# Patient Record
Sex: Female | Born: 1985 | Race: White | Hispanic: No | Marital: Married | Smoking: Never smoker
Health system: Southern US, Community
[De-identification: ages and names within clinical notes are randomized; demographics above are authoritative.]

## PROBLEM LIST (undated history)

## (undated) DIAGNOSIS — R32 Unspecified urinary incontinence: Secondary | ICD-10-CM

## (undated) DIAGNOSIS — M419 Scoliosis, unspecified: Secondary | ICD-10-CM

## (undated) DIAGNOSIS — M21379 Foot drop, unspecified foot: Secondary | ICD-10-CM

## (undated) DIAGNOSIS — Z973 Presence of spectacles and contact lenses: Secondary | ICD-10-CM

## (undated) DIAGNOSIS — M2550 Pain in unspecified joint: Secondary | ICD-10-CM

## (undated) HISTORY — DX: Foot drop, unspecified foot: M21.379

## (undated) HISTORY — DX: Pain in unspecified joint: M25.50

## (undated) HISTORY — DX: Unspecified urinary incontinence: R32

## (undated) HISTORY — DX: Scoliosis, unspecified: M41.9

---

## 2004-12-06 HISTORY — PX: WISDOM TOOTH EXTRACTION: SHX21

## 2018-11-10 ENCOUNTER — Other Ambulatory Visit: Payer: Self-pay | Admitting: Family Medicine

## 2018-11-10 ENCOUNTER — Encounter: Payer: Self-pay | Admitting: Family Medicine

## 2018-11-10 ENCOUNTER — Ambulatory Visit (INDEPENDENT_AMBULATORY_CARE_PROVIDER_SITE_OTHER): Payer: No Typology Code available for payment source | Admitting: Family Medicine

## 2018-11-10 VITALS — BP 90/62 | HR 79 | Temp 98.3°F | Ht 63.0 in | Wt 135.2 lb

## 2018-11-10 DIAGNOSIS — Z3A01 Less than 8 weeks gestation of pregnancy: Secondary | ICD-10-CM | POA: Diagnosis not present

## 2018-11-10 NOTE — Progress Notes (Signed)
   Subjective:    Patient ID: Lisa Brady, female    DOB: 01-08-1986, 32 y.o.   MRN: [id number]  HPI  Presents to clinic to establish care with PCP & to get referral to midwife.   She did at home pregnancy test and it was positive and LMP was 10.18.19. This would put her around [redacted] weeks pregnant.   Past medical, surgical, social and family history reviewed and updated in chart: History reviewed. No pertinent past medical history.   Social History   Tobacco Use  . Smoking status: Never Smoker  . Smokeless tobacco: Never Used  Substance Use Topics  . Alcohol use: Not Currently    Past Surgical History:  Procedure Laterality Date  . WISDOM TOOTH EXTRACTION  2006   Family History  Problem Relation Age of Onset  . Cancer Mother   . Hyperlipidemia Father   . Hypertension Father   . Hyperlipidemia Brother   . Cancer Maternal Grandmother   . Heart disease Maternal Grandfather   . Cancer Paternal Grandmother   . Cancer Paternal Grandfather     Review of Systems  Constitutional: Negative for chills, fatigue and fever.  HENT: Negative for congestion, ear pain, sinus pain and sore throat.   Eyes: Negative.   Respiratory: Negative for cough, shortness of breath and wheezing.   Cardiovascular: Negative for chest pain, palpitations and leg swelling.  Gastrointestinal: Negative for abdominal pain, diarrhea, nausea and vomiting.  Genitourinary: Negative for dysuria, frequency and urgency.  Musculoskeletal: Negative for arthralgias and myalgias.  Skin: Negative for color change, pallor and rash.  Neurological: Negative for syncope, light-headedness and headaches.  Psychiatric/Behavioral: The patient is not nervous/anxious.       Objective:   Physical Exam  Constitutional:  She appears well-developed and well-nourished. No distress.  HENT:  Head: Normocephalic and atraumatic.  Eyes: EOM are normal. No scleral icterus.  Neck: Normal range of motion. Neck supple. No tracheal  deviation present.  Cardiovascular: Normal rate, regular rhythm and normal heart sounds.  Pulmonary/Chest: Effort normal and breath sounds normal. No respiratory distress. She has no wheezes. She has no rales.   Neurological: She is alert and oriented to person, place, and time.  Gait normal  Skin: Skin is warm and dry. No pallor.  Psychiatric: She has a normal mood and affect. Her behavior is normal. Thought content normal.   Nursing note and vitals reviewed.    Vitals:   11/10/18 1039  BP: 90/62  Pulse: 79  Temp: 98.3 F (36.8 C)  SpO2: 98%    Assessment & Plan:   Pregnant, approximately 7 weeks- due to last menstrual.  Dating I can place patient at approximately [redacted] weeks pregnant.  Patient declines doing repeat urine pregnancy or doing blood draw hCG in clinic today.  I will place referral to midwife per patient request.  Patient aware she will hear from either our referral coordinator or referral coordinator at OB/GYN clinic in regards to her upcoming appointment.    Patient will follow-up here as needed, aware she can return to clinic at any time if issues arise.

## 2018-11-16 NOTE — Progress Notes (Signed)
Lisa Brady presents for NOB nurse interview visit. Pregnancy confirmation done 11/17/18 [facility].  G-1 .  P- 0   . Pregnancy education material explained and given.  0_ cats in the home. NOB labs ordered. HIV labs and Drug screen were explained optional and she did not decline. Drug screen ordered. PNV encouraged. Genetic screening options discussed. Genetic testing: Ordered/Declined/Unsure.  Pt may discuss with provider. Pt. To follow up with provider in 3__ weeks for NOB physical.  All questions answered.Pt would like Nucal Translucent. Will discuss with provider.

## 2018-11-17 ENCOUNTER — Ambulatory Visit: Payer: No Typology Code available for payment source | Admitting: Obstetrics and Gynecology

## 2018-11-17 VITALS — BP 115/73 | HR 75 | Ht 63.0 in | Wt 137.0 lb

## 2018-11-17 DIAGNOSIS — Z3401 Encounter for supervision of normal first pregnancy, first trimester: Secondary | ICD-10-CM

## 2018-11-17 NOTE — Addendum Note (Signed)
Addended by: Raliegh Ip on: 11/17/2018 10:57 AM   Modules accepted: Orders

## 2018-11-18 LAB — CBC WITH DIFFERENTIAL
Basophils Absolute: 0.1 10*3/uL (ref 0.0–0.2)
Basos: 1 %
EOS (ABSOLUTE): 0.1 10*3/uL (ref 0.0–0.4)
Eos: 1 %
Hematocrit: 38.7 % (ref 34.0–46.6)
Hemoglobin: 13 g/dL (ref 11.1–15.9)
Immature Grans (Abs): 0 10*3/uL (ref 0.0–0.1)
Immature Granulocytes: 0 %
Lymphocytes Absolute: 1.9 10*3/uL (ref 0.7–3.1)
Lymphs: 25 %
MCH: 30.2 pg (ref 26.6–33.0)
MCHC: 33.6 g/dL (ref 31.5–35.7)
MCV: 90 fL (ref 79–97)
Monocytes Absolute: 0.7 10*3/uL (ref 0.1–0.9)
Monocytes: 9 %
Neutrophils Absolute: 4.7 10*3/uL (ref 1.4–7.0)
Neutrophils: 64 %
RBC: 4.31 x10E6/uL (ref 3.77–5.28)
RDW: 12.5 % (ref 12.3–15.4)
WBC: 7.4 10*3/uL (ref 3.4–10.8)

## 2018-11-18 LAB — URINALYSIS, ROUTINE W REFLEX MICROSCOPIC
Bilirubin, UA: NEGATIVE
Glucose, UA: NEGATIVE
Ketones, UA: NEGATIVE
Leukocytes, UA: NEGATIVE
NITRITE UA: NEGATIVE
Protein, UA: NEGATIVE
RBC, UA: NEGATIVE
Specific Gravity, UA: 1.022 (ref 1.005–1.030)
Urobilinogen, Ur: 0.2 mg/dL (ref 0.2–1.0)
pH, UA: 7 (ref 5.0–7.5)

## 2018-11-18 LAB — HEPATITIS B SURFACE ANTIGEN: Hepatitis B Surface Ag: NEGATIVE

## 2018-11-18 LAB — NICOTINE SCREEN, URINE: Cotinine Ql Scrn, Ur: NEGATIVE ng/mL

## 2018-11-18 LAB — VARICELLA ZOSTER ANTIBODY, IGG: Varicella zoster IgG: <135 index — ABNORMAL LOW (ref >165)

## 2018-11-18 LAB — RUBELLA SCREEN: Rubella Antibodies, IGG: 1.32 index (ref >0.99)

## 2018-11-18 LAB — ABO AND RH: Rh Factor: POSITIVE

## 2018-11-18 LAB — RPR: RPR Ser Ql: NONREACTIVE

## 2018-11-18 LAB — HIV ANTIBODY (ROUTINE TESTING W REFLEX): HIV SCREEN 4TH GENERATION: NONREACTIVE

## 2018-11-18 LAB — ANTIBODY SCREEN: Antibody Screen: NEGATIVE

## 2018-11-19 LAB — URINE CULTURE

## 2018-11-20 ENCOUNTER — Other Ambulatory Visit: Payer: Self-pay | Admitting: Obstetrics and Gynecology

## 2018-11-20 DIAGNOSIS — O09899 Supervision of other high risk pregnancies, unspecified trimester: Secondary | ICD-10-CM

## 2018-11-20 DIAGNOSIS — Z2839 Other underimmunization status: Secondary | ICD-10-CM

## 2018-11-20 DIAGNOSIS — Z283 Underimmunization status: Principal | ICD-10-CM

## 2018-11-20 HISTORY — DX: Supervision of other high risk pregnancies, unspecified trimester: O09.899

## 2018-11-20 HISTORY — DX: Other underimmunization status: Z28.39

## 2018-11-21 LAB — GC/CHLAMYDIA PROBE AMP
Chlamydia trachomatis, NAA: NEGATIVE
Neisseria gonorrhoeae by PCR: NEGATIVE

## 2018-12-06 NOTE — L&D Delivery Note (Signed)
     Delivery Note   Lisa Brady is a 33 y.o. G1P1001 at [redacted]w[redacted]d Estimated Date of Delivery: 06/29/19  PRE-OPERATIVE DIAGNOSIS:  1) [redacted]w[redacted]d pregnancy.    POST-OPERATIVE DIAGNOSIS:  1) [redacted]w[redacted]d pregnancy s/p Vaginal, Spontaneous , Vacuum assisted    Delivery Type: Vaginal, Spontaneous  , vacuum assisted  Delivery Anesthesia: Epidural   Labor Complications:   none    ESTIMATED BLOOD LOSS: 600 ml    FINDINGS:   1) female infant, Apgar scores of 9   at 1 minute and 9   at 5 minutes and a birthweight pending infant remains skin to skin.    2) Nuchal cord: None, Compound presentation of left hand.   SPECIMENS:   PLACENTA:   Appearance: Intact , 3 vessel cord   Removal: Spontaneous      Disposition:  Held per protocol then discarded  DISPOSITION:  Infant to left in stable condition in the delivery room, with L&D personnel and mother,  NARRATIVE SUMMARY: Labor course:  Ms. Lisa Brady is a G1P1001 at [redacted]w[redacted]d who presented for induction of labor.  She progressed well in labor with cytotec and pitocin.  She received the appropriate epidural  anesthesia and proceeded to complete dilation. She evidenced adequate maternal expulsive effort during the second stage. Dr. Marcelline Mates in to assist with Vacuum. Vacuum placed by Dr. Marcelline Mates pop off x 1. She went on to deliver a viable female infant in LOT. The placenta delivered without problems and was noted to be complete. A perineal and vaginal examination was performed. Episiotomy/Lacerations: 2nd degree   Lacerations were repaired with 3-0 Vicryl Rapide suture using local anesthesia. The patient tolerated this well. 800 mcg cytotec placed rectally.   Philip Aspen, CNM  07/05/2019 12:16 PM

## 2018-12-08 ENCOUNTER — Encounter: Payer: Self-pay | Admitting: Obstetrics and Gynecology

## 2018-12-08 ENCOUNTER — Ambulatory Visit (INDEPENDENT_AMBULATORY_CARE_PROVIDER_SITE_OTHER): Payer: No Typology Code available for payment source | Admitting: Obstetrics and Gynecology

## 2018-12-08 VITALS — BP 114/63 | HR 81 | Wt 136.9 lb

## 2018-12-08 DIAGNOSIS — Z3491 Encounter for supervision of normal pregnancy, unspecified, first trimester: Secondary | ICD-10-CM

## 2018-12-08 LAB — POCT URINALYSIS DIPSTICK OB
Bilirubin, UA: NEGATIVE
Blood, UA: NEGATIVE
Glucose, UA: NEGATIVE
KETONES UA: NEGATIVE
Leukocytes, UA: NEGATIVE
Nitrite, UA: NEGATIVE
POC,PROTEIN,UA: NEGATIVE
SPEC GRAV UA: 1.01 (ref 1.010–1.025)
Urobilinogen, UA: 0.2 E.U./dL
pH, UA: 7.5 (ref 5.0–8.0)

## 2018-12-08 NOTE — Patient Instructions (Signed)
Thank you for enrolling in [name]. Please follow the instructions below to securely access your online medical record. MyChart allows you to send messages to your doctor, view your test results, renew your prescriptions, schedule appointments, and more.  How Do I Sign Up? 1. In your Internet browser, go to [email or web address]. 2. Click on the New  User? link in the Sign In box.  3. Enter your MyChart Access Code exactly as it appears below. You will not need to use this code after you have completed the sign-up process. If you do not sign up before the expiration date, you must request a new code. MyChart Access Code: [email or web address] Expires: 12/25/2018 11:01 AM  4. Enter the last four digits of your Social Security Number (xxxx) and Date of Birth (mm/dd/yyyy) as indicated and click Next. You will be taken to the next sign-up page. 5. Create a MyChart ID. This will be your MyChart login ID and cannot be changed, so think of one that is secure and easy to remember. 6. Create a MyChart password. You can change your password at any time. 7. Enter your Password Reset Question and Answer and click Next. This can be used at a later time if you forget your password.  8. Select your communication preference, and if applicable enter your e-mail address. You will receive e-mail notification when new information is available in MyChart by choosing to receive e-mail notifications and filling in your e-mail. 9. Click Sign In. You can now view your medical record.   Additional Information If you have questions, you can email [email or web address] or call [phone] to talk to our [place] staff. Remember, MyChart is NOT to be used for urgent needs. For medical emergencies, dial 911.  Second Trimester of Pregnancy  The second trimester is from week 14 through week 27 (month 4 through 6). This is often the time in pregnancy that you feel your best. Often times, morning sickness  has lessened or quit. You may have more energy, and you may get hungry more often. Your unborn baby is growing rapidly. At the end of the sixth month, he or she is about 9 inches long and weighs about 1 pounds. You will likely feel the baby move between 18 and 20 weeks of pregnancy. Follow these instructions at home: Medicines  Take over-the-counter and prescription medicines only as told by your doctor. Some medicines are safe and some medicines are not safe during pregnancy.  Take a prenatal vitamin that contains at least 600 micrograms (mcg) of folic acid.  If you have trouble pooping (constipation), take medicine that will make your stool soft (stool softener) if your doctor approves. Eating and drinking   Eat regular, healthy meals.  Avoid raw meat and uncooked cheese.  If you get low calcium from the food you eat, talk to your doctor about taking a daily calcium supplement.  Avoid foods that are high in fat and sugars, such as fried and sweet foods.  If you feel sick to your stomach (nauseous) or throw up (vomit): ? Eat 4 or 5 small meals a day instead of 3 large meals. ? Try eating a few soda crackers. ? Drink liquids between meals instead of during meals.  To prevent constipation: ? Eat foods that are high in fiber, like fresh fruits and vegetables, whole grains, and beans. ? Drink enough fluids to keep your pee (urine) clear or pale yellow. Activity  Exercise only as told by your doctor. Stop  exercising if you start to have cramps.  Do not exercise if it is too hot, too humid, or if you are in a place of great height (high altitude).  Avoid heavy lifting.  Wear low-heeled shoes. Sit and stand up straight.  You can continue to have sex unless your doctor tells you not to. Relieving pain and discomfort  Wear a good support bra if your breasts are tender.  Take warm water baths (sitz baths) to soothe pain or discomfort caused by hemorrhoids. Use hemorrhoid cream if  your doctor approves.  Rest with your legs raised if you have leg cramps or low back pain.  If you develop puffy, bulging veins (varicose veins) in your legs: ? Wear support hose or compression stockings as told by your doctor. ? Raise (elevate) your feet for 15 minutes, 3-4 times a day. ? Limit salt in your food. Prenatal care  Write down your questions. Take them to your prenatal visits.  Keep all your prenatal visits as told by your doctor. This is important. Safety  Wear your seat belt when driving.  Make a list of emergency phone numbers, including numbers for family, friends, the hospital, and police and fire departments. General instructions  Ask your doctor about the right foods to eat or for help finding a counselor, if you need these services.  Ask your doctor about local prenatal classes. Begin classes before month 6 of your pregnancy.  Do not use hot tubs, steam rooms, or saunas.  Do not douche or use tampons or scented sanitary pads.  Do not cross your legs for long periods of time.  Visit your dentist if you have not done so. Use a soft toothbrush to brush your teeth. Floss gently.  Avoid all smoking, herbs, and alcohol. Avoid drugs that are not approved by your doctor.  Do not use any products that contain nicotine or tobacco, such as cigarettes and e-cigarettes. If you need help quitting, ask your doctor.  Avoid cat litter boxes and soil used by cats. These carry germs that can cause birth defects in the baby and can cause a loss of your baby (miscarriage) or stillbirth. Contact a doctor if:  You have mild cramps or pressure in your lower belly.  You have pain when you pee (urinate).  You have bad smelling fluid coming from your vagina.  You continue to feel sick to your stomach (nauseous), throw up (vomit), or have watery poop (diarrhea).  You have a nagging pain in your belly area.  You feel dizzy. Get help right away if:  You have a fever.  You  are leaking fluid from your vagina.  You have spotting or bleeding from your vagina.  You have severe belly cramping or pain.  You lose or gain weight rapidly.  You have trouble catching your breath and have chest pain.  You notice sudden or extreme puffiness (swelling) of your face, hands, ankles, feet, or legs.  You have not felt the baby move in over an hour.  You have severe headaches that do not go away when you take medicine.  You have trouble seeing. Summary  The second trimester is from week 14 through week 27 (months 4 through 6). This is often the time in pregnancy that you feel your best.  To take care of yourself and your unborn baby, you will need to eat healthy meals, take medicines only if your doctor tells you to do so, and do activities that are safe for you  and your baby.  Call your doctor if you get sick or if you notice anything unusual about your pregnancy. Also, call your doctor if you need help with the right food to eat, or if you want to know what activities are safe for you. This information is not intended to replace advice given to you by your health care provider. Make sure you discuss any questions you have with your health care provider. Document Released: 02/16/2010 Document Revised: 12/28/2016 Document Reviewed: 12/28/2016 Elsevier Interactive Patient Education  2019 [employer].

## 2018-12-08 NOTE — Progress Notes (Signed)
NEW OB HISTORY AND PHYSICAL  SUBJECTIVE:       Lisa Brady is a 33 y.o. G1P0 female, Patient's last menstrual period was 09/22/2018 (exact date)., Estimated Date of Delivery: 06/29/19, [redacted]w[redacted]d, presents today for establishment of Prenatal Care. She has no unusual complaints and complains of fatigue      Gynecologic History Patient's last menstrual period was 09/22/2018 (exact date). Normal Contraception: none Last Pap: spring 2019. Results were: normal  Obstetric History OB History  Gravida Para Term Preterm AB Living  1            SAB TAB Ectopic Multiple Live Births               # Outcome Date GA Lbr Len/2nd Weight Sex Delivery Anes PTL Lv  1 Current             No past medical history on file.  Past Surgical History:  Procedure Laterality Date  . WISDOM TOOTH EXTRACTION  2006    Current Outpatient Medications on File Prior to Visit  Medication Sig Dispense Refill  . Prenatal Vit-Fe Fumarate-FA (PRENATAL MULTIVITAMIN) TABS tablet Take 1 tablet by mouth daily at 12 noon.     No current facility-administered medications on file prior to visit.     No Known Allergies  Social History   Socioeconomic History  . Marital status: Married    Spouse name: Not on file  . Number of children: Not on file  . Years of education: Not on file  . Highest education level: Not on file  Occupational History  . Not on file  Social Needs  . Financial resource strain: Not on file  . Food insecurity:    Worry: Not on file    Inability: Not on file  . Transportation needs:    Medical: Not on file    Non-medical: Not on file  Tobacco Use  . Smoking status: Never Smoker  . Smokeless tobacco: Never Used  Substance and Sexual Activity  . Alcohol use: Not Currently  . Drug use: Never  . Sexual activity: Yes    Birth control/protection: None  Lifestyle  . Physical activity:    Days per week: Not on file    Minutes per session: Not on file  . Stress: Not on file  Relationships   . Social connections:    Talks on phone: Not on file    Gets together: Not on file    Attends religious service: Not on file    Active member of club or organization: Not on file    Attends meetings of clubs or organizations: Not on file    Relationship status: Not on file  . Intimate partner violence:    Fear of current or ex partner: Not on file    Emotionally abused: Not on file    Physically abused: Not on file    Forced sexual activity: Not on file  Other Topics Concern  . Not on file  Social History Narrative  . Not on file    Family History  Problem Relation Age of Onset  . Cancer Mother   . Hyperlipidemia Father   . Hypertension Father   . Hyperlipidemia Brother   . Cancer Maternal Grandmother   . Heart disease Maternal Grandfather   . Cancer Paternal Grandmother   . Cancer Paternal Grandfather     The following portions of the patient's history were reviewed and updated as appropriate: allergies, current medications, past OB history, past medical history,  past surgical history, past family history, past social history, and problem list.    OBJECTIVE: Initial Physical Exam (New OB)  GENERAL APPEARANCE: alert, well appearing, in no apparent distress, oriented to person, place and time HEAD: normocephalic, atraumatic MOUTH: mucous membranes moist, pharynx normal without lesions and dental hygiene good THYROID: no thyromegaly or masses present BREASTS: not examined LUNGS: clear to auscultation, no wheezes, rales or rhonchi, symmetric air entry HEART: regular rate and rhythm, no murmurs ABDOMEN: soft, nontender, nondistended, no abnormal masses, no epigastric pain, fundus not palpable and FHT present EXTREMITIES: no redness or tenderness in the calves or thighs SKIN: normal coloration and turgor, no rashes LYMPH NODES: no adenopathy palpable NEUROLOGIC: alert, oriented, normal speech, no focal findings or movement disorder noted  PELVIC EXAM not  examined  ASSESSMENT: Normal pregnancy Varicella NI  PLAN: Prenatal care Desires NT- will schedule.  Flu vaccine already received this year See orders

## 2018-12-08 NOTE — Progress Notes (Signed)
NOB PE-pt is doing well

## 2018-12-20 ENCOUNTER — Ambulatory Visit (INDEPENDENT_AMBULATORY_CARE_PROVIDER_SITE_OTHER): Payer: No Typology Code available for payment source

## 2018-12-20 ENCOUNTER — Other Ambulatory Visit: Payer: No Typology Code available for payment source

## 2018-12-20 DIAGNOSIS — Z3491 Encounter for supervision of normal pregnancy, unspecified, first trimester: Secondary | ICD-10-CM

## 2018-12-20 DIAGNOSIS — Z3682 Encounter for antenatal screening for nuchal translucency: Secondary | ICD-10-CM

## 2018-12-26 ENCOUNTER — Encounter (HOSPITAL_COMMUNITY): Payer: Self-pay

## 2018-12-27 ENCOUNTER — Ambulatory Visit (INDEPENDENT_AMBULATORY_CARE_PROVIDER_SITE_OTHER): Payer: No Typology Code available for payment source

## 2018-12-27 ENCOUNTER — Other Ambulatory Visit: Payer: Self-pay | Admitting: Obstetrics and Gynecology

## 2018-12-27 DIAGNOSIS — Z3491 Encounter for supervision of normal pregnancy, unspecified, first trimester: Secondary | ICD-10-CM

## 2018-12-27 DIAGNOSIS — Z3682 Encounter for antenatal screening for nuchal translucency: Secondary | ICD-10-CM

## 2019-01-05 ENCOUNTER — Ambulatory Visit (INDEPENDENT_AMBULATORY_CARE_PROVIDER_SITE_OTHER): Payer: No Typology Code available for payment source | Admitting: Certified Nurse Midwife

## 2019-01-05 VITALS — BP 106/62 | HR 91 | Wt 142.5 lb

## 2019-01-05 DIAGNOSIS — Z3402 Encounter for supervision of normal first pregnancy, second trimester: Secondary | ICD-10-CM

## 2019-01-05 LAB — POCT URINALYSIS DIPSTICK OB
Bilirubin, UA: NEGATIVE
Blood, UA: NEGATIVE
Glucose, UA: NEGATIVE
Ketones, UA: NEGATIVE
LEUKOCYTES UA: NEGATIVE
NITRITE UA: NEGATIVE
POC,PROTEIN,UA: NEGATIVE
Spec Grav, UA: <=1.005 — AB (ref 1.010–1.025)
Urobilinogen, UA: 0.2 E.U./dL
pH, UA: 7 (ref 5.0–8.0)

## 2019-01-05 NOTE — Patient Instructions (Signed)
WE WOULD LOVE TO HEAR FROM YOU!!!!   Thank you Lisa Brady for visiting [facility].  Providing our patients with the best experience possible is really important to [place], and we hope that you felt that on your recent visit. The most valuable feedback we get comes from [place]!!    If you receive a survey please take a couple of minutes to let us know how we did.Thank you for continuing to trust [place] with your care.   [facility]  WHAT OB PATIENTS CAN EXPECT   Confirmation of pregnancy and ultrasound ordered if medically indicated-[redacted] weeks gestation  New OB (NOB) intake with nurse and New OB (NOB) labs- [redacted] weeks gestation  New OB (NOB) physical examination with provider- 11/[redacted] weeks gestation  Flu vaccine-[redacted] weeks gestation  Anatomy scan-[redacted] weeks gestation  Glucose tolerance test, blood work to test for anemia, T-dap vaccine-[redacted] weeks gestation  Vaginal swabs/cultures-STD/Group B strep-[redacted] weeks gestation  Appointments every 4 weeks until 28 weeks  Every 2 weeks from 28 weeks until 36 weeks  Weekly visits from 36 weeks until delivery   Round Ligament Pain  The round ligament is a cord of muscle and tissue that helps support the uterus. It can become a source of pain during pregnancy if it becomes stretched or twisted as the baby grows. The pain usually begins in the second trimester (13-28 weeks) of pregnancy, and it can come and go until the baby is delivered. It is not a serious problem, and it does not cause harm to the baby. Round ligament pain is usually a short, sharp, and pinching pain, but it can also be a dull, lingering, and aching pain. The pain is felt in the lower side of the abdomen or in the groin. It usually starts deep in the groin and moves up to the outside of the hip area. The pain may occur when you:  Suddenly change position, such as quickly going from a sitting to standing position.  Roll over in bed.  Cough or sneeze.  Do physical  activity. Follow these instructions at home:   Watch your condition for any changes.  When the pain starts, relax. Then try any of these methods to help with the pain: ? Sitting down. ? Flexing your knees up to your abdomen. ? Lying on your side with one pillow under your abdomen and another pillow between your legs. ? Sitting in a warm bath for 15-20 minutes or until the pain goes away.  Take over-the-counter and prescription medicines only as told by your health care provider.  Move slowly when you sit down or stand up.  Avoid long walks if they cause pain.  Stop or reduce your physical activities if they cause pain.  Keep all follow-up visits as told by your health care provider. This is important. Contact a health care provider if:  Your pain does not go away with treatment.  You feel pain in your back that you did not have before.  Your medicine is not helping. Get help right away if:  You have a fever or chills.  You develop uterine contractions.  You have vaginal bleeding.  You have nausea or vomiting.  You have diarrhea.  You have pain when you urinate. Summary  Round ligament pain is felt in the lower abdomen or groin. It is usually a short, sharp, and pinching pain. It can also be a dull, lingering, and aching pain.  This pain usually begins in the second  trimester (13-28 weeks). It occurs because the uterus is stretching with the growing baby, and it is not harmful to the baby.  You may notice the pain when you suddenly change position, when you cough or sneeze, or during physical activity.  Relaxing, flexing your knees to your abdomen, lying on one side, or taking a warm bath may help to get rid of the pain.  Get help from your health care provider if the pain does not go away or if you have vaginal bleeding, nausea, vomiting, diarrhea, or painful urination. This information is not intended to replace advice given to you by your health care provider.  Make sure you discuss any questions you have with your health care provider. Document Released: 08/31/2008 Document Revised: 05/10/2018 Document Reviewed: 05/10/2018 Elsevier Interactive Patient Education  2019 [employer].

## 2019-01-05 NOTE — Progress Notes (Signed)
ROB-Patient c/o headaches, has tried Tylenol with some relief.  Patient also c/o sore throat and runny nose that started 2 days ago, no fever.

## 2019-01-05 NOTE — Progress Notes (Signed)
ROB-Reports sore throat, runny nose, and intermittent headaches. Discussed home treatment measures including use of OTC medications including magnesium. Anticipatory guidance regarding course of prenatal care. Reviewed red flag symptoms and when to call. RTC x 4-5 weeks for ANATOMY SCAN and ROB or sooner if needed.

## 2019-01-29 ENCOUNTER — Other Ambulatory Visit: Payer: Self-pay | Admitting: Certified Nurse Midwife

## 2019-01-29 DIAGNOSIS — Z3402 Encounter for supervision of normal first pregnancy, second trimester: Secondary | ICD-10-CM

## 2019-01-29 DIAGNOSIS — Z3689 Encounter for other specified antenatal screening: Secondary | ICD-10-CM

## 2019-01-31 ENCOUNTER — Ambulatory Visit (INDEPENDENT_AMBULATORY_CARE_PROVIDER_SITE_OTHER): Payer: No Typology Code available for payment source

## 2019-01-31 ENCOUNTER — Ambulatory Visit (INDEPENDENT_AMBULATORY_CARE_PROVIDER_SITE_OTHER): Payer: No Typology Code available for payment source | Admitting: Certified Nurse Midwife

## 2019-01-31 VITALS — BP 100/61 | HR 73 | Wt 146.0 lb

## 2019-01-31 DIAGNOSIS — Z3402 Encounter for supervision of normal first pregnancy, second trimester: Secondary | ICD-10-CM

## 2019-01-31 DIAGNOSIS — Z363 Encounter for antenatal screening for malformations: Secondary | ICD-10-CM | POA: Diagnosis not present

## 2019-01-31 DIAGNOSIS — Z3689 Encounter for other specified antenatal screening: Secondary | ICD-10-CM

## 2019-01-31 NOTE — Progress Notes (Signed)
ROB doing well. Feeling some movement - anterior placenta reviewed with pt. U/s today for anatomy complete (see below). Reviewed weight gain . Encouraged regular exercise and watching her diet. She verbalizes and agrees to plan . Reviewed round ligament pain and use of belly band, heat, ice , and tylenol for musculoskeletal discomforts. Follow up 4 wks.   Philip Aspen, CNM   Patient Name: Lisa Brady DOB: 21-Dec-1985 MRN: [id number]  ULTRASOUND REPORT  Location: [id number] Date of Service: 01/31/2019   Indications:Anatomy Ultrasound Findings:  Lisa Brady intrauterine pregnancy is visualized with FHR at 158 BPM. Biometrics give an (U/S) Gestational age of [redacted]w[redacted]d and an (U/S) EDD of 06/21/19; this correlates with the clinically established Estimated Date of Delivery: 06/29/19  Fetal presentation is Breech.  EFW: wnl. Placenta: anterior. Grade: 0 AFI: subjectively normal.  Anatomic survey is complete and normal; Gender - surprise.    Right Ovary is normal in appearance. Left Ovary is normal appearance. Survey of the adnexa demonstrates no adnexal masses. There is no free peritoneal fluid in the cul de sac.  Impression: 1. [redacted]w[redacted]d Viable Singleton Intrauterine pregnancy by U/S. 2. (U/S) EDD is consistent with Clinically established Estimated Date of Delivery: 06/29/19 . 3. Normal Anatomy Scan  Recommendations: 1.Clinical correlation with the patient's History and Physical Exam. 2.   Ninfa Linden, RDMS

## 2019-01-31 NOTE — Patient Instructions (Signed)

## 2019-02-02 ENCOUNTER — Ambulatory Visit (INDEPENDENT_AMBULATORY_CARE_PROVIDER_SITE_OTHER): Payer: No Typology Code available for payment source | Admitting: Family Medicine

## 2019-02-02 ENCOUNTER — Encounter: Payer: No Typology Code available for payment source | Admitting: Certified Nurse Midwife

## 2019-02-02 ENCOUNTER — Encounter: Payer: Self-pay | Admitting: Family Medicine

## 2019-02-02 VITALS — BP 96/58 | HR 83 | Temp 98.2°F | Resp 16 | Ht 63.0 in | Wt 145.6 lb

## 2019-02-02 DIAGNOSIS — M549 Dorsalgia, unspecified: Secondary | ICD-10-CM

## 2019-02-02 DIAGNOSIS — M25512 Pain in left shoulder: Secondary | ICD-10-CM | POA: Diagnosis not present

## 2019-02-02 NOTE — Progress Notes (Signed)
Subjective:    Patient ID: Lisa Brady, female    DOB: 12-19-1985, 33 y.o.   MRN: [id number]  HPI   Patient presents to clinic complaining of left upper back and left shoulder pain for about 3 weeks.  Denies any fall or trauma.  She does carry her laptop using her left arm, so at first suspect it was from overuse.  She started taking Tylenol, heat, ice and doing range of motion exercises with some help in reducing pain.  Patient has begun to have pain at random times when she has not done any thing that would induce pain, she will have burning across upper back and into left shoulder with washing a few dishes, laying in bed.  Denies any numbness or tingling in fingers.   Patient is [redacted] weeks pregnant.  Patient Active Problem List   Diagnosis Date Noted  . Maternal varicella, non-immune 11/20/2018   Social History   Tobacco Use  . Smoking status: Never Smoker  . Smokeless tobacco: Never Used  Substance Use Topics  . Alcohol use: Not Currently   Review of Systems  Constitutional: Negative for chills, fatigue and fever.  HENT: Negative for congestion, ear pain, sinus pain and sore throat.   Eyes: Negative.   Respiratory: Negative for cough, shortness of breath and wheezing.   Cardiovascular: Negative for chest pain, palpitations and leg swelling.  Gastrointestinal: Negative for abdominal pain, diarrhea, nausea and vomiting.  Genitourinary: Negative for dysuria, frequency and urgency.  Musculoskeletal: +left shoulder, left upper back pain Skin: Negative for color change, pallor and rash.  Neurological: Negative for syncope, light-headedness and headaches.  Psychiatric/Behavioral: The patient is not nervous/anxious.       Objective:   Physical Exam Vitals signs and nursing note reviewed.  Constitutional:      General: She is not in acute distress.    Appearance: She is not toxic-appearing.  HENT:     Head: Normocephalic and atraumatic.  Neck:     Musculoskeletal:  Normal range of motion. No neck rigidity.  Cardiovascular:     Rate and Rhythm: Normal rate and regular rhythm.     Pulses: Normal pulses.  Pulmonary:     Effort: Pulmonary effort is normal.     Breath sounds: Normal breath sounds.  Musculoskeletal:        General: No deformity.       Arms:     Comments: Location of pain represented purple circle on diagram.  Patient able to reach left arm straight up above head, behind back, up to side.  Is able to have both arm straight out in front of her and resist me pushing down.  No tenderness of AC joint of shoulder.  Skin:    General: Skin is warm and dry.     Coloration: Skin is not jaundiced or pale.     Findings: No erythema.  Neurological:     Mental Status: She is alert and oriented to person, place, and time.  Psychiatric:        Mood and Affect: Mood normal.        Behavior: Behavior normal.    Vitals:   02/02/19 0832  BP: (!) 96/58  Pulse: 83  Resp: 16  Temp: 98.2 F (36.8 C)  SpO2: 99%      Assessment & Plan:   Left shoulder pain, upper back pain on left side- due to patient being pregnant, our medication options are limited to Tylenol, topical heat and ice.  Patient will do physical therapy for evaluation and strengthening of muscles of back and shoulder.  I do not suspect anything is wrong with the bones due to physical exam, it appears to be more of a muscular issue.  Patient will keep regularly scheduled follow-up as planned, she will return to clinic sooner if any issues arise.  She is aware she will be contacted in regards to PT referral.

## 2019-02-09 ENCOUNTER — Telehealth: Payer: Self-pay

## 2019-02-09 NOTE — Telephone Encounter (Signed)
Copied from [place] [id number]. Topic: General - Other >> Feb 09, 2019 10:44 AM Oneta Rack wrote: Patient states [facility] is not in network, patient checked with insurance and would like referral placed with [facility] and would like a follow up call once referral is placed, please advise

## 2019-02-12 NOTE — Telephone Encounter (Signed)
Patient called to check status of referral. She is requesting a call back.

## 2019-02-12 NOTE — Telephone Encounter (Signed)
Pt called [facility] Patient states [facility] is not in network, patient checked with insurance and would like referral placed with [facility] and would like a follow up call once referral is placed, please advise

## 2019-02-12 NOTE — Telephone Encounter (Signed)
Melissa --  Can you change the PT referral location? If not then I will put in new referral  Thanks!  LG

## 2019-02-13 NOTE — Telephone Encounter (Signed)
Patient would like her referral sent to [facility] physical therapy and not [facility]. She says it was sent to [facility] again yesterday. Please call patient once changed.

## 2019-02-15 NOTE — Telephone Encounter (Signed)
This was sent to [facility] in [place]. I have resent it and requested [place] location.

## 2019-03-02 ENCOUNTER — Encounter: Payer: No Typology Code available for payment source | Admitting: Certified Nurse Midwife

## 2019-03-22 ENCOUNTER — Telehealth: Payer: Self-pay

## 2019-03-22 NOTE — Telephone Encounter (Signed)
Coronavirus (COVID-19) Are you at risk?  Are you at risk for the Coronavirus (COVID-19)?  To be considered HIGH RISK for Coronavirus (COVID-19), you have to meet the following criteria:  . Traveled to [place], [place], [place], [place] or [place]; or in the [place] to [place], [place], [place], or [place]; and have fever, cough, and shortness of breath within the last 2 weeks of travel OR . Been in close contact with a person diagnosed with COVID-19 within the last 2 weeks and have fever, cough, and shortness of breath . IF YOU DO NOT MEET THESE CRITERIA, YOU ARE CONSIDERED LOW RISK FOR COVID-19.  What to do if you are HIGH RISK for COVID-19?  Marland Kitchen If you are having a medical emergency, call 911. . Seek medical care right away. Before you go to a doctor's office, urgent care or emergency department, call ahead and tell them about your recent travel, contact with someone diagnosed with COVID-19, and your symptoms. You should receive instructions from your physician's office regarding next steps of care.  . When you arrive at healthcare provider, tell the healthcare staff immediately you have returned from visiting [place], [place], [place], [place] or [place]; or traveled in the [place] to [place], [place], [place], or [place]; in the last two weeks or you have been in close contact with a person diagnosed with COVID-19 in the last 2 weeks.   . Tell the health care staff about your symptoms: fever, cough and shortness of breath. . After you have been seen by a medical provider, you will be either: o Tested for (COVID-19) and discharged home on quarantine except to seek medical care if symptoms worsen, and asked to  - Stay home and avoid contact with others until you get your results (4-5 days)  - Avoid travel on public transportation if possible (such as bus, train, or airplane) or o Sent to the Emergency Department by EMS for evaluation, COVID-19 testing, and possible  admission depending on your condition and test results.  What to do if you are LOW RISK for COVID-19?  Reduce your risk of any infection by using the same precautions used for avoiding the common cold or flu:  Marland Kitchen Wash your hands often with soap and warm water for at least 20 seconds.  If soap and water are not readily available, use an alcohol-based hand sanitizer with at least 60% alcohol.  . If coughing or sneezing, cover your mouth and nose by coughing or sneezing into the elbow areas of your shirt or coat, into a tissue or into your sleeve (not your hands). . Avoid shaking hands with others and consider head nods or verbal greetings only. . Avoid touching your eyes, nose, or mouth with unwashed hands.  . Avoid close contact with people who are Lisa Brady. . Avoid places or events with large numbers of people in one location, like concerts or sporting events. . Carefully consider travel plans you have or are making. . If you are planning any travel outside or inside the [place], visit the CDC's Travelers' Health webpage for the latest health notices. . If you have some symptoms but not all symptoms, continue to monitor at home and seek medical attention if your symptoms worsen. . If you are having a medical emergency, call 911.  03/22/19 SCREENING NEG SLS ADDITIONAL HEALTHCARE OPTIONS FOR PATIENTS  [facility] / e-Visit: [email or web address]         [facility]: [phone]  [facility]: [phone]                   [facility]: [phone]  

## 2019-03-23 ENCOUNTER — Ambulatory Visit (INDEPENDENT_AMBULATORY_CARE_PROVIDER_SITE_OTHER): Payer: No Typology Code available for payment source | Admitting: Certified Nurse Midwife

## 2019-03-23 ENCOUNTER — Other Ambulatory Visit: Payer: Self-pay

## 2019-03-23 ENCOUNTER — Encounter: Payer: Self-pay | Admitting: Certified Nurse Midwife

## 2019-03-23 ENCOUNTER — Other Ambulatory Visit: Payer: No Typology Code available for payment source

## 2019-03-23 VITALS — BP 90/59 | HR 78 | Wt 150.3 lb

## 2019-03-23 DIAGNOSIS — Z23 Encounter for immunization: Secondary | ICD-10-CM | POA: Diagnosis not present

## 2019-03-23 DIAGNOSIS — Z131 Encounter for screening for diabetes mellitus: Secondary | ICD-10-CM

## 2019-03-23 DIAGNOSIS — Z3402 Encounter for supervision of normal first pregnancy, second trimester: Secondary | ICD-10-CM

## 2019-03-23 DIAGNOSIS — Z130 Encounter for screening for diseases of the blood and blood-forming organs and certain disorders involving the immune mechanism: Secondary | ICD-10-CM

## 2019-03-23 LAB — POCT URINALYSIS DIPSTICK OB
Bilirubin, UA: NEGATIVE
Blood, UA: NEGATIVE
Glucose, UA: NEGATIVE
Ketones, UA: NEGATIVE
Leukocytes, UA: NEGATIVE
Nitrite, UA: NEGATIVE
POC,PROTEIN,UA: NEGATIVE
Spec Grav, UA: 1.01 (ref 1.010–1.025)
Urobilinogen, UA: 0.2 E.U./dL
pH, UA: 6 (ref 5.0–8.0)

## 2019-03-23 MED ORDER — TETANUS-DIPHTH-ACELL PERTUSSIS 5-2.5-18.5 LF-MCG/0.5 IM SUSP
0.5000 mL | Freq: Once | INTRAMUSCULAR | Status: AC
  Administered 2019-03-23: 0.5 mL via INTRAMUSCULAR

## 2019-03-23 NOTE — Patient Instructions (Signed)
Glucose Tolerance Test During Pregnancy Why am I having this test? The glucose tolerance test (GTT) is done to check how your body processes sugar (glucose). This is one of several tests used to diagnose diabetes that develops during pregnancy (gestational diabetes mellitus). Gestational diabetes is a temporary form of diabetes that some women develop during pregnancy. It usually occurs during the second trimester of pregnancy and goes away after delivery. Testing (screening) for gestational diabetes usually occurs between 24 and 28 weeks of pregnancy. You may have the GTT test after having a 1-hour glucose screening test if the results from that test indicate that you may have gestational diabetes. You may also have this test if:  You have a history of gestational diabetes.  You have a history of giving birth to very large babies or have experienced repeated fetal loss (stillbirth).  You have signs and symptoms of diabetes, such as: ? Changes in your vision. ? Tingling or numbness in your hands or feet. ? Changes in hunger, thirst, and urination that are not otherwise explained by your pregnancy. What is being tested? This test measures the amount of glucose in your blood at different times during a period of 3 hours. This indicates how well your body is able to process glucose. What kind of sample is taken?  Blood samples are required for this test. They are usually collected by inserting a needle into a blood vessel. How do I prepare for this test?  For 3 days before your test, eat normally. Have plenty of carbohydrate-rich foods.  Follow instructions from your health care provider about: ? Eating or drinking restrictions on the day of the test. You may be asked to not eat or drink anything other than water (fast) starting 8-10 hours before the test. ? Changing or stopping your regular medicines. Some medicines may interfere with this test. Tell a health care provider about:  All  medicines you are taking, including vitamins, herbs, eye drops, creams, and over-the-counter medicines.  Any blood disorders you have.  Any surgeries you have had.  Any medical conditions you have. What happens during the test? First, your blood glucose will be measured. This is referred to as your fasting blood glucose, since you fasted before the test. Then, you will drink a glucose solution that contains a certain amount of glucose. Your blood glucose will be measured again 1, 2, and 3 hours after drinking the solution. This test takes about 3 hours to complete. You will need to stay at the testing location during this time. During the testing period:  Do not eat or drink anything other than the glucose solution.  Do not exercise.  Do not use any products that contain nicotine or tobacco, such as cigarettes and e-cigarettes. If you need help stopping, ask your health care provider. The testing procedure may vary among health care providers and hospitals. How are the results reported? Your results will be reported as milligrams of glucose per deciliter of blood (mg/dL) or millimoles per liter (mmol/L). Your health care provider will compare your results to normal ranges that were established after testing a large group of people (reference ranges). Reference ranges may vary among labs and hospitals. For this test, common reference ranges are:  Fasting: less than 95-105 mg/dL (5.3-5.8 mmol/L).  1 hour after drinking glucose: less than 180-190 mg/dL (10.0-10.5 mmol/L).  2 hours after drinking glucose: less than 155-165 mg/dL (8.6-9.2 mmol/L).  3 hours after drinking glucose: 140-145 mg/dL (7.8-8.1 mmol/L). What do the   results mean? Results within reference ranges are considered normal, meaning that your glucose levels are well-controlled. If two or more of your blood glucose levels are high, you may be diagnosed with gestational diabetes. If only one level is high, your health care  provider may suggest repeat testing or other tests to confirm a diagnosis. Talk with your health care provider about what your results mean. Questions to ask your health care provider Ask your health care provider, or the department that is doing the test:  When will my results be ready?  How will I get my results?  What are my treatment options?  What other tests do I need?  What are my next steps? Summary  The glucose tolerance test (GTT) is one of several tests used to diagnose diabetes that develops during pregnancy (gestational diabetes mellitus). Gestational diabetes is a temporary form of diabetes that some women develop during pregnancy.  You may have the GTT test after having a 1-hour glucose screening test if the results from that test indicate that you may have gestational diabetes. You may also have this test if you have any symptoms or risk factors for gestational diabetes.  Talk with your health care provider about what your results mean. This information is not intended to replace advice given to you by your health care provider. Make sure you discuss any questions you have with your health care provider. Document Released: 05/23/2012 Document Revised: 07/04/2017 Document Reviewed: 07/04/2017 Elsevier Interactive Patient Education  2019 Elsevier Inc.  

## 2019-03-23 NOTE — Progress Notes (Signed)
ROB doing well. Feels movement. Glucose screen/CBC/RPR/TDAP/BTC today. Pt interested in classes for her and her husband. Name added to list to follow up once alternative class available. Discussed BC after baby. She is leaning towards pill .  Sample birth plan given. Will follow up with results. Return in 5 wks.   Philip Aspen, CNM

## 2019-03-23 NOTE — Progress Notes (Signed)
ROB- gluclola done, blood consent signed, tdap given, pt is doing well

## 2019-03-24 LAB — CBC
Hematocrit: 35.7 % (ref 34.0–46.6)
Hemoglobin: 12 g/dL (ref 11.1–15.9)
MCH: 30.6 pg (ref 26.6–33.0)
MCHC: 33.6 g/dL (ref 31.5–35.7)
MCV: 91 fL (ref 79–97)
Platelets: 218 10*3/uL (ref 150–450)
RBC: 3.92 x10E6/uL (ref 3.77–5.28)
RDW: 12.3 % (ref 11.7–15.4)
WBC: 9 10*3/uL (ref 3.4–10.8)

## 2019-03-24 LAB — GLUCOSE, 1 HOUR GESTATIONAL: Gestational Diabetes Screen: 99 mg/dL (ref 65–139)

## 2019-03-24 LAB — RPR: RPR Ser Ql: NONREACTIVE

## 2019-04-26 ENCOUNTER — Telehealth: Payer: Self-pay | Admitting: *Deleted

## 2019-04-26 NOTE — Telephone Encounter (Signed)
Coronavirus (COVID-19) Are you at risk?  Are you at risk for the Coronavirus (COVID-19)?  To be considered HIGH RISK for Coronavirus (COVID-19), you have to meet the following criteria:  . Traveled to [place], [place], [place], [place] or [place]; or in the [place] to [place], [name], [place], or [place]; and have fever, cough, and shortness of breath within the last 2 weeks of travel OR . Been in close contact with a person diagnosed with COVID-19 within the last 2 weeks and have fever, cough, and shortness of breath . IF YOU DO NOT MEET THESE CRITERIA, YOU ARE CONSIDERED LOW RISK FOR COVID-19.  What to do if you are HIGH RISK for COVID-19?  . If you are having a medical emergency, call 911. . Seek medical care right away. Before you go to a doctor's office, urgent care or emergency department, call ahead and tell them about your recent travel, contact with someone diagnosed with COVID-19, and your symptoms. You should receive instructions from your physician's office regarding next steps of care.  . When you arrive at healthcare provider, tell the healthcare staff immediately you have returned from visiting [place], [place], [place], [place] or [place]; or traveled in the [place] to [place], [name], [place], or [place]; in the last two weeks or you have been in close contact with a person diagnosed with COVID-19 in the last 2 weeks.   . Tell the health care staff about your symptoms: fever, cough and shortness of breath. . After you have been seen by a medical provider, you will be either: o Tested for (COVID-19) and discharged home on quarantine except to seek medical care if symptoms worsen, and asked to  - Stay home and avoid contact with others until you get your results (4-5 days)  - Avoid travel on public transportation if possible (such as bus, train, or airplane) or o Sent to the Emergency Department by EMS for evaluation, COVID-19 testing, and possible  admission depending on your condition and test results.  What to do if you are LOW RISK for COVID-19?  Reduce your risk of any infection by using the same precautions used for avoiding the common cold or flu:  . Wash your hands often with soap and warm water for at least 20 seconds.  If soap and water are not readily available, use an alcohol-based hand sanitizer with at least 60% alcohol.  . If coughing or sneezing, cover your mouth and nose by coughing or sneezing into the elbow areas of your shirt or coat, into a tissue or into your sleeve (not your hands). . Avoid shaking hands with others and consider head nods or verbal greetings only. . Avoid touching your eyes, nose, or mouth with unwashed hands.  . Avoid close contact with people who are sick. . Avoid places or events with large numbers of people in one location, like concerts or sporting events. . Carefully consider travel plans you have or are making. . If you are planning any travel outside or inside the [place], visit the CDC's Travelers' Health webpage for the latest health notices. . If you have some symptoms but not all symptoms, continue to monitor at home and seek medical attention if your symptoms worsen. . If you are having a medical emergency, call 911.   ADDITIONAL HEALTHCARE OPTIONS FOR PATIENTS  [facility] / e-Visit: [email or web address]         [facility]: [phone]  [facility]: [phone]                   [facility]: [phone]   Spoke with pt denies any sx.  Sanay Belmar, CMA 

## 2019-04-27 ENCOUNTER — Other Ambulatory Visit: Payer: Self-pay

## 2019-04-27 ENCOUNTER — Ambulatory Visit (INDEPENDENT_AMBULATORY_CARE_PROVIDER_SITE_OTHER): Payer: No Typology Code available for payment source | Admitting: Obstetrics and Gynecology

## 2019-04-27 VITALS — BP 90/62 | HR 92 | Wt 155.7 lb

## 2019-04-27 DIAGNOSIS — Z3493 Encounter for supervision of normal pregnancy, unspecified, third trimester: Secondary | ICD-10-CM

## 2019-04-27 LAB — POCT URINALYSIS DIPSTICK OB
Bilirubin, UA: NEGATIVE
Blood, UA: NEGATIVE
Glucose, UA: NEGATIVE
Ketones, UA: NEGATIVE
Leukocytes, UA: NEGATIVE
Nitrite, UA: NEGATIVE
POC,PROTEIN,UA: NEGATIVE
Spec Grav, UA: 1.01 (ref 1.010–1.025)
Urobilinogen, UA: 0.2 E.U./dL
pH, UA: 7 (ref 5.0–8.0)

## 2019-04-27 NOTE — Progress Notes (Signed)
ROB- pt is doing well

## 2019-04-27 NOTE — Patient Instructions (Signed)
.  spinning babies website for encourage baby to turn head down

## 2019-04-27 NOTE — Progress Notes (Signed)
ROB- doing well, discussed delivery plans, breast feeding, OCPs, circumcision if a boy, encouraged to enroll in on-line classes.

## 2019-05-11 ENCOUNTER — Other Ambulatory Visit: Payer: Self-pay

## 2019-05-11 ENCOUNTER — Ambulatory Visit (INDEPENDENT_AMBULATORY_CARE_PROVIDER_SITE_OTHER): Payer: No Typology Code available for payment source | Admitting: Certified Nurse Midwife

## 2019-05-11 VITALS — BP 110/56 | HR 94 | Wt 157.9 lb

## 2019-05-11 DIAGNOSIS — Z3493 Encounter for supervision of normal pregnancy, unspecified, third trimester: Secondary | ICD-10-CM

## 2019-05-11 LAB — POCT URINALYSIS DIPSTICK OB
Bilirubin, UA: NEGATIVE
Blood, UA: NEGATIVE
Glucose, UA: NEGATIVE
Ketones, UA: NEGATIVE
Leukocytes, UA: NEGATIVE
Nitrite, UA: NEGATIVE
POC,PROTEIN,UA: NEGATIVE
Spec Grav, UA: 1.01 (ref 1.010–1.025)
Urobilinogen, UA: 0.2 E.U./dL
pH, UA: 6.5 (ref 5.0–8.0)

## 2019-05-11 NOTE — Patient Instructions (Signed)

## 2019-05-11 NOTE — Progress Notes (Signed)
ROB-Doing well, no questions or concerns. Enrolled in online childbirth classes, started yesterday. Discussed COVID restrictions and intrapartum testing. Vertex by bedside ultrasound. Anticipatory guidance regarding course of prenatal care. Reviewed red flag symptoms and when to call. RTC x 3 weeks for 36 wk cultures and ROB or sooner if needed.

## 2019-05-11 NOTE — Progress Notes (Signed)
ROB-No complaints.

## 2019-05-31 ENCOUNTER — Telehealth: Payer: Self-pay | Admitting: *Deleted

## 2019-05-31 NOTE — Telephone Encounter (Signed)
Coronavirus (COVID-19) Are you at risk?  Are you at risk for the Coronavirus (COVID-19)?  To be considered HIGH RISK for Coronavirus (COVID-19), you have to meet the following criteria:  . Traveled to [place], [place], [place], [place] or [place]; or in the [place] to [place], [name], [place], or [place]; and have fever, cough, and shortness of breath within the last 2 weeks of travel OR . Been in close contact with a person diagnosed with COVID-19 within the last 2 weeks and have fever, cough, and shortness of breath . IF YOU DO NOT MEET THESE CRITERIA, YOU ARE CONSIDERED LOW RISK FOR COVID-19.  What to do if you are HIGH RISK for COVID-19?  . If you are having a medical emergency, call 911. . Seek medical care right away. Before you go to a doctor's office, urgent care or emergency department, call ahead and tell them about your recent travel, contact with someone diagnosed with COVID-19, and your symptoms. You should receive instructions from your physician's office regarding next steps of care.  . When you arrive at healthcare provider, tell the healthcare staff immediately you have returned from visiting [place], [place], [place], [place] or [place]; or traveled in the [place] to [place], [name], [place], or [place]; in the last two weeks or you have been in close contact with a person diagnosed with COVID-19 in the last 2 weeks.   . Tell the health care staff about your symptoms: fever, cough and shortness of breath. . After you have been seen by a medical provider, you will be either: o Tested for (COVID-19) and discharged home on quarantine except to seek medical care if symptoms worsen, and asked to  - Stay home and avoid contact with others until you get your results (4-5 days)  - Avoid travel on public transportation if possible (such as bus, train, or airplane) or o Sent to the Emergency Department by EMS for evaluation, COVID-19 testing, and possible  admission depending on your condition and test results.  What to do if you are LOW RISK for COVID-19?  Reduce your risk of any infection by using the same precautions used for avoiding the common cold or flu:  . Wash your hands often with soap and warm water for at least 20 seconds.  If soap and water are not readily available, use an alcohol-based hand sanitizer with at least 60% alcohol.  . If coughing or sneezing, cover your mouth and nose by coughing or sneezing into the elbow areas of your shirt or coat, into a tissue or into your sleeve (not your hands). . Avoid shaking hands with others and consider head nods or verbal greetings only. . Avoid touching your eyes, nose, or mouth with unwashed hands.  . Avoid close contact with people who are sick. . Avoid places or events with large numbers of people in one location, like concerts or sporting events. . Carefully consider travel plans you have or are making. . If you are planning any travel outside or inside the [place], visit the CDC's Travelers' Health webpage for the latest health notices. . If you have some symptoms but not all symptoms, continue to monitor at home and seek medical attention if your symptoms worsen. . If you are having a medical emergency, call 911.   ADDITIONAL HEALTHCARE OPTIONS FOR PATIENTS  [facility] / e-Visit: [email or web address]         [facility]: [phone]  [facility]: [phone]                   [facility]: [phone]   Spoke with pt denies any sx.  Neema Barreira, CMA 

## 2019-06-01 ENCOUNTER — Ambulatory Visit (INDEPENDENT_AMBULATORY_CARE_PROVIDER_SITE_OTHER): Payer: No Typology Code available for payment source | Admitting: Obstetrics and Gynecology

## 2019-06-01 ENCOUNTER — Other Ambulatory Visit: Payer: Self-pay

## 2019-06-01 VITALS — BP 118/68 | HR 82 | Wt 159.6 lb

## 2019-06-01 DIAGNOSIS — Z113 Encounter for screening for infections with a predominantly sexual mode of transmission: Secondary | ICD-10-CM

## 2019-06-01 DIAGNOSIS — Z3493 Encounter for supervision of normal pregnancy, unspecified, third trimester: Secondary | ICD-10-CM

## 2019-06-01 DIAGNOSIS — Z3685 Encounter for antenatal screening for Streptococcus B: Secondary | ICD-10-CM

## 2019-06-01 LAB — POCT URINALYSIS DIPSTICK OB
Bilirubin, UA: NEGATIVE
Blood, UA: NEGATIVE
Glucose, UA: NEGATIVE
Ketones, UA: NEGATIVE
Leukocytes, UA: NEGATIVE
Nitrite, UA: NEGATIVE
POC,PROTEIN,UA: NEGATIVE
Spec Grav, UA: 1.015 (ref 1.010–1.025)
Urobilinogen, UA: 0.2 E.U./dL
pH, UA: 6.5 (ref 5.0–8.0)

## 2019-06-01 NOTE — Progress Notes (Signed)
ROB- doing well, labor precautions discussed, cultures obtained. Declined pelvic exam.

## 2019-06-01 NOTE — Progress Notes (Signed)
ROB- cultures obtained, pt is doing well

## 2019-06-03 LAB — STREP GP B NAA: Strep Gp B NAA: NEGATIVE

## 2019-06-05 LAB — GC/CHLAMYDIA PROBE AMP
Chlamydia trachomatis, NAA: NEGATIVE
Neisseria Gonorrhoeae by PCR: NEGATIVE

## 2019-06-13 ENCOUNTER — Other Ambulatory Visit: Payer: Self-pay

## 2019-06-13 ENCOUNTER — Ambulatory Visit (INDEPENDENT_AMBULATORY_CARE_PROVIDER_SITE_OTHER): Payer: No Typology Code available for payment source | Admitting: Certified Nurse Midwife

## 2019-06-13 ENCOUNTER — Encounter: Payer: Self-pay | Admitting: Certified Nurse Midwife

## 2019-06-13 VITALS — BP 109/66 | HR 84 | Wt 163.4 lb

## 2019-06-13 DIAGNOSIS — Z3493 Encounter for supervision of normal pregnancy, unspecified, third trimester: Secondary | ICD-10-CM

## 2019-06-13 LAB — POCT URINALYSIS DIPSTICK OB
Bilirubin, UA: NEGATIVE
Blood, UA: NEGATIVE
Glucose, UA: NEGATIVE
Ketones, UA: NEGATIVE
Leukocytes, UA: NEGATIVE
Nitrite, UA: NEGATIVE
POC,PROTEIN,UA: NEGATIVE
Spec Grav, UA: 1.01 (ref 1.010–1.025)
Urobilinogen, UA: 0.2 E.U./dL
pH, UA: 7.5 (ref 5.0–8.0)

## 2019-06-13 NOTE — Progress Notes (Signed)
ROB doing well. Feels good movement. Has irregular braxton hicks contractions. Labor precautions reviewed. Follow up 1 wk s  Philip Aspen, CNM

## 2019-06-13 NOTE — Patient Instructions (Signed)
Braxton Hicks Contractions Contractions of the uterus can occur throughout pregnancy, but they are not always a sign that you are in labor. You may have practice contractions called Braxton Hicks contractions. These false labor contractions are sometimes confused with true labor. What are Braxton Hicks contractions? Braxton Hicks contractions are tightening movements that occur in the muscles of the uterus before labor. Unlike true labor contractions, these contractions do not result in opening (dilation) and thinning of the cervix. Toward the end of pregnancy (32-34 weeks), Braxton Hicks contractions can happen more often and may become stronger. These contractions are sometimes difficult to tell apart from true labor because they can be very uncomfortable. You should not feel embarrassed if you go to the hospital with false labor. Sometimes, the only way to tell if you are in true labor is for your health care provider to look for changes in the cervix. The health care provider will do a physical exam and may monitor your contractions. If you are not in true labor, the exam should show that your cervix is not dilating and your water has not broken. If there are no other health problems associated with your pregnancy, it is completely safe for you to be sent home with false labor. You may continue to have Braxton Hicks contractions until you go into true labor. How to tell the difference between true labor and false labor True labor  Contractions last 30-70 seconds.  Contractions become very regular.  Discomfort is usually felt in the top of the uterus, and it spreads to the lower abdomen and low back.  Contractions do not go away with walking.  Contractions usually become more intense and increase in frequency.  The cervix dilates and gets thinner. False labor  Contractions are usually shorter and not as strong as true labor contractions.  Contractions are usually irregular.  Contractions  are often felt in the front of the lower abdomen and in the groin.  Contractions may go away when you walk around or change positions while lying down.  Contractions get weaker and are shorter-lasting as time goes on.  The cervix usually does not dilate or become thin. Follow these instructions at home:   Take over-the-counter and prescription medicines only as told by your health care provider.  Keep up with your usual exercises and follow other instructions from your health care provider.  Eat and drink lightly if you think you are going into labor.  If Braxton Hicks contractions are making you uncomfortable: ? Change your position from lying down or resting to walking, or change from walking to resting. ? Sit and rest in a tub of warm water. ? Drink enough fluid to keep your urine pale yellow. Dehydration may cause these contractions. ? Do slow and deep breathing several times an hour.  Keep all follow-up prenatal visits as told by your health care provider. This is important. Contact a health care provider if:  You have a fever.  You have continuous pain in your abdomen. Get help right away if:  Your contractions become stronger, more regular, and closer together.  You have fluid leaking or gushing from your vagina.  You pass blood-tinged mucus (bloody show).  You have bleeding from your vagina.  You have low back pain that you never had before.  You feel your baby's head pushing down and causing pelvic pressure.  Your baby is not moving inside you as much as it used to. Summary  Contractions that occur before labor are   called Braxton Hicks contractions, false labor, or practice contractions.  Braxton Hicks contractions are usually shorter, weaker, farther apart, and less regular than true labor contractions. True labor contractions usually become progressively stronger and regular, and they become more frequent.  Manage discomfort from [facility] contractions  by changing position, resting in a warm bath, drinking plenty of water, or practicing deep breathing. This information is not intended to replace advice given to you by your health care provider. Make sure you discuss any questions you have with your health care provider. Document Released: 04/07/2017 Document Revised: 11/04/2017 Document Reviewed: 04/07/2017 Elsevier Patient Education  2020 [employer].

## 2019-06-22 ENCOUNTER — Ambulatory Visit (INDEPENDENT_AMBULATORY_CARE_PROVIDER_SITE_OTHER): Payer: No Typology Code available for payment source | Admitting: Certified Nurse Midwife

## 2019-06-22 ENCOUNTER — Other Ambulatory Visit: Payer: Self-pay

## 2019-06-22 ENCOUNTER — Encounter: Payer: Self-pay | Admitting: Certified Nurse Midwife

## 2019-06-22 VITALS — BP 95/70 | HR 96 | Wt 166.2 lb

## 2019-06-22 DIAGNOSIS — Z3493 Encounter for supervision of normal pregnancy, unspecified, third trimester: Secondary | ICD-10-CM

## 2019-06-22 LAB — POCT URINALYSIS DIPSTICK OB
Bilirubin, UA: NEGATIVE
Blood, UA: NEGATIVE
Glucose, UA: NEGATIVE
Ketones, UA: NEGATIVE
Leukocytes, UA: NEGATIVE
Nitrite, UA: NEGATIVE
POC,PROTEIN,UA: NEGATIVE
Spec Grav, UA: 1.01 (ref 1.010–1.025)
Urobilinogen, UA: 0.2 E.U./dL
pH, UA: 6 (ref 5.0–8.0)

## 2019-06-22 NOTE — Patient Instructions (Signed)
Braxton Hicks Contractions Contractions of the uterus can occur throughout pregnancy, but they are not always a sign that you are in labor. You may have practice contractions called Braxton Hicks contractions. These false labor contractions are sometimes confused with true labor. What are Braxton Hicks contractions? Braxton Hicks contractions are tightening movements that occur in the muscles of the uterus before labor. Unlike true labor contractions, these contractions do not result in opening (dilation) and thinning of the cervix. Toward the end of pregnancy (32-34 weeks), Braxton Hicks contractions can happen more often and may become stronger. These contractions are sometimes difficult to tell apart from true labor because they can be very uncomfortable. You should not feel embarrassed if you go to the hospital with false labor. Sometimes, the only way to tell if you are in true labor is for your health care provider to look for changes in the cervix. The health care provider will do a physical exam and may monitor your contractions. If you are not in true labor, the exam should show that your cervix is not dilating and your water has not broken. If there are no other health problems associated with your pregnancy, it is completely safe for you to be sent home with false labor. You may continue to have Braxton Hicks contractions until you go into true labor. How to tell the difference between true labor and false labor True labor  Contractions last 30-70 seconds.  Contractions become very regular.  Discomfort is usually felt in the top of the uterus, and it spreads to the lower abdomen and low back.  Contractions do not go away with walking.  Contractions usually become more intense and increase in frequency.  The cervix dilates and gets thinner. False labor  Contractions are usually shorter and not as strong as true labor contractions.  Contractions are usually irregular.  Contractions  are often felt in the front of the lower abdomen and in the groin.  Contractions may go away when you walk around or change positions while lying down.  Contractions get weaker and are shorter-lasting as time goes on.  The cervix usually does not dilate or become thin. Follow these instructions at home:   Take over-the-counter and prescription medicines only as told by your health care provider.  Keep up with your usual exercises and follow other instructions from your health care provider.  Eat and drink lightly if you think you are going into labor.  If Braxton Hicks contractions are making you uncomfortable: ? Change your position from lying down or resting to walking, or change from walking to resting. ? Sit and rest in a tub of warm water. ? Drink enough fluid to keep your urine pale yellow. Dehydration may cause these contractions. ? Do slow and deep breathing several times an hour.  Keep all follow-up prenatal visits as told by your health care provider. This is important. Contact a health care provider if:  You have a fever.  You have continuous pain in your abdomen. Get help right away if:  Your contractions become stronger, more regular, and closer together.  You have fluid leaking or gushing from your vagina.  You pass blood-tinged mucus (bloody show).  You have bleeding from your vagina.  You have low back pain that you never had before.  You feel your baby's head pushing down and causing pelvic pressure.  Your baby is not moving inside you as much as it used to. Summary  Contractions that occur before labor are   called Braxton Hicks contractions, false labor, or practice contractions.  Braxton Hicks contractions are usually shorter, weaker, farther apart, and less regular than true labor contractions. True labor contractions usually become progressively stronger and regular, and they become more frequent.  Manage discomfort from [facility] contractions  by changing position, resting in a warm bath, drinking plenty of water, or practicing deep breathing. This information is not intended to replace advice given to you by your health care provider. Make sure you discuss any questions you have with your health care provider. Document Released: 04/07/2017 Document Revised: 11/04/2017 Document Reviewed: 04/07/2017 Elsevier Patient Education  2020 [employer].

## 2019-06-22 NOTE — Progress Notes (Signed)
ROB doing well. Feels good movement. Discussed induction at 41 wks. Reviewed u/s for BPP and growth next visit. She verbalizes and agrees to plan of care. Follow up 1 wk.   Philip Aspen, CNM

## 2019-06-26 ENCOUNTER — Telehealth: Payer: Self-pay | Admitting: Certified Nurse Midwife

## 2019-06-26 NOTE — Telephone Encounter (Signed)
Per JML, we can schedule her 7/24 at 4:30pm.  Thanks. Jennye Moccasin

## 2019-06-26 NOTE — Telephone Encounter (Signed)
[facility] called the patient and scheduled the pt for a BPP on 06/29/19 @ 4:00 pm the patient is  Wanting to know when she will be able to see the provider after since the u/s the hospital scheduled is late on a Friday. Please advise.

## 2019-06-26 NOTE — Telephone Encounter (Signed)
Please see message below

## 2019-06-27 ENCOUNTER — Telehealth: Payer: Self-pay | Admitting: Certified Nurse Midwife

## 2019-06-27 NOTE — Telephone Encounter (Signed)
Patient is schedule for BPP ultrasound at the [facility] on [address] on Friday at 4pm. They stated that is all they had available for the week. When should she she be scheduled to see a midwife. She is just concerned there will be something concerning on the ultrasound. Please Advise.Thanks

## 2019-06-28 ENCOUNTER — Other Ambulatory Visit: Payer: Self-pay | Admitting: Certified Nurse Midwife

## 2019-06-28 DIAGNOSIS — Z3493 Encounter for supervision of normal pregnancy, unspecified, third trimester: Secondary | ICD-10-CM

## 2019-06-28 NOTE — Telephone Encounter (Signed)
Done.  Lisa Brady w/ pt. Pt aware and will come after u/s tomorrow on 06/29/19.  [place]

## 2019-06-29 ENCOUNTER — Other Ambulatory Visit: Payer: Self-pay

## 2019-06-29 ENCOUNTER — Ambulatory Visit
Admission: RE | Admit: 2019-06-29 | Discharge: 2019-06-29 | Disposition: A | Discharge status: Home / Self Care | Payer: No Typology Code available for payment source | Source: Ambulatory Visit | Attending: Certified Nurse Midwife | Admitting: Certified Nurse Midwife

## 2019-06-29 ENCOUNTER — Ambulatory Visit (INDEPENDENT_AMBULATORY_CARE_PROVIDER_SITE_OTHER): Payer: No Typology Code available for payment source | Admitting: Certified Nurse Midwife

## 2019-06-29 VITALS — BP 108/72 | HR 77 | Wt 164.9 lb

## 2019-06-29 DIAGNOSIS — Z3493 Encounter for supervision of normal pregnancy, unspecified, third trimester: Secondary | ICD-10-CM | POA: Diagnosis present

## 2019-06-29 LAB — POCT URINALYSIS DIPSTICK OB
Bilirubin, UA: NEGATIVE
Blood, UA: NEGATIVE
Glucose, UA: NEGATIVE
Ketones, UA: NEGATIVE
Nitrite, UA: NEGATIVE
POC,PROTEIN,UA: NEGATIVE
Spec Grav, UA: <=1.005 — AB (ref 1.010–1.025)
Urobilinogen, UA: 0.2 E.U./dL
pH, UA: 6.5 (ref 5.0–8.0)

## 2019-06-29 NOTE — Progress Notes (Signed)
ROB-Doing well. Questions regarding breast pump, advised lactation will send home with pump because of employment status with [employer]. BPP today 8/8, growth 95% (4387 grams); findings discussed with pt, verbalized understanding. Infant in posterior position, advised spinning babies exercises and curb walking. Encouraged home labor preparation techniques. Anticipatory guidance regarding postdates care. Reviewed red flag symptoms and when to call. RTC x Monday for NST and ROB or sooner if needed.

## 2019-06-29 NOTE — Patient Instructions (Signed)
Nonstress Test A nonstress test is a procedure that is done during pregnancy in order to check the baby's heartbeat. The procedure can help show if the baby (fetus) is healthy. It is commonly done when:  The baby is past his or her due date.  The pregnancy is high risk.  The baby is moving less than normal.  The mother has lost a pregnancy in the past.  The health care provider suspects a problem with the baby's growth.  There is too much or too little amniotic fluid. The procedure is often done in the third trimester of pregnancy to find out if an early delivery is needed and whether such a delivery is safe. During a nonstress test, the baby's heartbeat is monitored when the baby is resting and when the baby is moving. If the baby is healthy, the heart rate will increase when he or she moves or kicks and will return to normal when he or she rests. Tell a health care provider about:  Any allergies you have.  Any medical conditions you have.  All medicines you are taking, including vitamins, herbs, eye drops, creams, and over-the-counter medicines. What are the risks? There are no risks to you or your baby from a nonstress test. This procedure should not be painful or uncomfortable. What happens before the procedure?  Eat a meal right before the test or as directed by your health care provider. Food may help encourage the baby to move.  Use the restroom right before the test. What happens during the procedure?  Two monitors will be placed on your abdomen. One will record the baby's heart rate and the other will record the contractions of your uterus.  You may be asked to lie down on your side or to sit upright.  You may be given a button to press when you feel your baby move.  Your health care provider will listen to your baby's heartbeat and recorded it. He or she may also watch your baby's heartbeat on a screen.  If the baby seems to be sleeping, you may be asked to drink  some juice or soda, eat a snack, or change positions. The procedure may vary among health care providers and hospitals. What happens after the procedure?  Your health care provider will discuss the test results with you and make recommendations for the future. Depending on the results, your health care provider may order additional tests or another course of action.  If your health care provider gave you any diet or activity instructions, make sure to follow them.  Keep all follow-up visits as told by your health care provider. This is important. Summary  A nonstress test is a procedure that is done during pregnancy in order to check the baby's heartbeat. The procedure can help show if the baby is healthy.  The procedure is often done in the third trimester of pregnancy to find out if an early delivery is needed and whether such a delivery is safe.  During a nonstress test, the baby's heartbeat is monitored when the baby is resting and when the baby is moving. If the baby is healthy, the heart rate will increase when he or she moves or kicks and will return to normal when he or she rests.  Your health care provider will discuss the test results with you and make recommendations for the future. This information is not intended to replace advice given to you by your health care provider. Make sure you discuss any  questions you have with your health care provider. Document Released: 11/12/2002 Document Revised: 03/03/2017 Document Reviewed: 03/03/2017 Elsevier Patient Education  2020 [place]. Fetal Movement Counts Patient Name: ________________________________________________ Patient Due Date: ____________________ What is a fetal movement count?  A fetal movement count is the number of times that you feel your baby move during a certain amount of time. This may also be called a fetal kick count. A fetal movement count is recommended for every pregnant woman. You may be asked to start  counting fetal movements as early as week 28 of your pregnancy. Pay attention to when your baby is most active. You may notice your baby's sleep and wake cycles. You may also notice things that make your baby move more. You should do a fetal movement count:  When your baby is normally most active.  At the same time each day. A good time to count movements is while you are resting, after having something to eat and drink. How do I count fetal movements? 1. Find a quiet, comfortable area. Sit, or lie down on your side. 2. Write down the date, the start time and stop time, and the number of movements that you felt between those two times. Take this information with you to your health care visits. 3. For 2 hours, count kicks, flutters, swishes, rolls, and jabs. You should feel at least 10 movements during 2 hours. 4. You may stop counting after you have felt 10 movements. 5. If you do not feel 10 movements in 2 hours, have something to eat and drink. Then, keep resting and counting for 1 hour. If you feel at least 4 movements during that hour, you may stop counting. Contact a health care provider if:  You feel fewer than 4 movements in 2 hours.  Your baby is not moving like he or she usually does. Date: ____________ Start time: ____________ Stop time: ____________ Movements: ____________ Date: ____________ Start time: ____________ Stop time: ____________ Movements: ____________ Date: ____________ Start time: ____________ Stop time: ____________ Movements: ____________ Date: ____________ Start time: ____________ Stop time: ____________ Movements: ____________ Date: ____________ Start time: ____________ Stop time: ____________ Movements: ____________ Date: ____________ Start time: ____________ Stop time: ____________ Movements: ____________ Date: ____________ Start time: ____________ Stop time: ____________ Movements: ____________ Date: ____________ Start time: ____________ Stop time: ____________  Movements: ____________ Date: ____________ Start time: ____________ Stop time: ____________ Movements: ____________ This information is not intended to replace advice given to you by your health care provider. Make sure you discuss any questions you have with your health care provider. Document Released: 12/22/2006 Document Revised: 12/12/2018 Document Reviewed: 01/01/2016 Elsevier Patient Education  2020 [employer].

## 2019-06-29 NOTE — Progress Notes (Signed)
ROB-No complaints.

## 2019-07-02 ENCOUNTER — Other Ambulatory Visit: Payer: Self-pay

## 2019-07-02 ENCOUNTER — Ambulatory Visit (INDEPENDENT_AMBULATORY_CARE_PROVIDER_SITE_OTHER): Payer: No Typology Code available for payment source | Admitting: Certified Nurse Midwife

## 2019-07-02 ENCOUNTER — Other Ambulatory Visit: Payer: No Typology Code available for payment source

## 2019-07-02 VITALS — BP 110/79 | HR 100 | Wt 166.3 lb

## 2019-07-02 DIAGNOSIS — O480 Post-term pregnancy: Secondary | ICD-10-CM | POA: Diagnosis not present

## 2019-07-02 LAB — POCT URINALYSIS DIPSTICK OB
Bilirubin, UA: NEGATIVE
Blood, UA: NEGATIVE
Glucose, UA: NEGATIVE
Nitrite, UA: NEGATIVE
Spec Grav, UA: 1.02 (ref 1.010–1.025)
Urobilinogen, UA: 0.2 E.U./dL
pH, UA: 6 (ref 5.0–8.0)

## 2019-07-02 NOTE — Progress Notes (Signed)
ROB-Patient c/o intermittent vaginal and pelvic pressure x two (2) days. Request induction of labor on Wednesday, mother leaving town on Friday. Discussed home labor preparation techniques. SVE: soft, but closed. Induction options discussed. IOL scheduled for Wednesday, 07/04/2019 at 0500. Reviewed red flag symptoms and when to call. RTC x 7 weeks for PPV or sooner if needed.   NONSTRESS TEST INTERPRETATION  INDICATIONS: Postdates  FHR baseline: 130 RESULTS:Reactive COMMENTS: Occasional contractions   PLAN: 1. Continue fetal kick counts twice a day. 2. Reports to [facility] at 0500 on 07/04/2019 or sooner if needed   Diona Fanti, CNM [facility], [facility] 07/02/19 4:56 PM

## 2019-07-02 NOTE — Patient Instructions (Signed)
Labor Induction    Labor induction is when steps are taken to cause a pregnant woman to begin the labor process. Most women go into labor on their own between 37 weeks and 42 weeks of pregnancy. When this does not happen or when there is a medical need for labor to begin, steps may be taken to induce labor. Labor induction causes a pregnant woman's uterus to contract. It also causes the cervix to soften (ripen), open (dilate), and thin out (efface). Usually, labor is not induced before 39 weeks of pregnancy unless there is a medical reason to do so. Your health care provider will determine if labor induction is needed.  Before inducing labor, your health care provider will consider a number of factors, including:  · Your medical condition and your baby's.  · How many weeks along you are in your pregnancy.  · How mature your baby's lungs are.  · The condition of your cervix.  · The position of your baby.  · The size of your birth canal.  What are some reasons for labor induction?  Labor may be induced if:  · Your health or your baby's health is at risk.  · Your pregnancy is overdue by 1 week or more.  · Your water breaks but labor does not start on its own.  · There is a low amount of amniotic fluid around your baby.  You may also choose (elect) to have labor induced at a certain time. Generally, elective labor induction is done no earlier than 39 weeks of pregnancy.  What methods are used for labor induction?  Methods used for labor induction include:  · Prostaglandin medicine. This medicine starts contractions and causes the cervix to dilate and ripen. It can be taken by mouth (orally) or by being inserted into the vagina (suppository).  · Inserting a small, thin tube (catheter) with a balloon into the vagina and then expanding the balloon with water to dilate the cervix.  · Stripping the membranes. In this method, your health care provider gently separates amniotic sac tissue from the cervix. This causes the  cervix to stretch, which in turn causes the release of a hormone called progesterone. The hormone causes the uterus to contract. This procedure is often done during an office visit, after which you will be sent home to wait for contractions to begin.  · Breaking the water. In this method, your health care provider uses a small instrument to make a small hole in the amniotic sac. This eventually causes the amniotic sac to break. Contractions should begin after a few hours.  · Medicine to trigger or strengthen contractions. This medicine is given through an IV that is inserted into a vein in your arm.  Except for membrane stripping, which can be done in a clinic, labor induction is done in the hospital so that you and your baby can be carefully monitored.  How long does it take for labor to be induced?  The length of time it takes to induce labor depends on how ready your body is for labor. Some inductions can take up to 2-3 days, while others may take less than a day. Induction may take longer if:  · You are induced early in your pregnancy.  · It is your first pregnancy.  · Your cervix is not ready.  What are some risks associated with labor induction?  Some risks associated with labor induction include:  · Changes in fetal heart rate, such as being too   of the placenta from the uterus (rare).  Rupture of the uterus (very rare). When induction is needed for medical reasons, the benefits of induction generally outweigh the risks. What are some reasons for not inducing labor? Labor induction should not be done if:  Your baby does not tolerate contractions.  You have had previous surgeries on your uterus, such as a myomectomy, removal of fibroids, or a vertical scar from a previous cesarean delivery.  Your placenta lies  very low in your uterus and blocks the opening of the cervix (placenta previa).  Your baby is not in a head-down position.  The umbilical cord drops down into the birth canal in front of the baby.  There are unusual circumstances, such as the baby being very early (premature).  You have had more than 2 previous cesarean deliveries. Summary  Labor induction is when steps are taken to cause a pregnant woman to begin the labor process.  Labor induction causes a pregnant woman's uterus to contract. It also causes the cervix to ripen, dilate, and efface.  Labor is not induced before 39 weeks of pregnancy unless there is a medical reason to do so.  When induction is needed for medical reasons, the benefits of induction generally outweigh the risks. This information is not intended to replace advice given to you by your health care provider. Make sure you discuss any questions you have with your health care provider. Document Released: 04/13/2007 Document Revised: 11/25/2017 Document Reviewed: 01/05/2017 Elsevier Patient Education  2020 [employer].   Fetal Movement Counts Patient Name: ________________________________________________ Patient Due Date: ____________________ What is a fetal movement count?  A fetal movement count is the number of times that you feel your baby move during a certain amount of time. This may also be called a fetal kick count. A fetal movement count is recommended for every pregnant woman. You may be asked to start counting fetal movements as early as week 28 of your pregnancy. Pay attention to when your baby is most active. You may notice your baby's sleep and wake cycles. You may also notice things that make your baby move more. You should do a fetal movement count:  When your baby is normally most active.  At the same time each day. A good time to count movements is while you are resting, after having something to eat and drink. How do I count fetal  movements? 1. Find a quiet, comfortable area. Sit, or lie down on your side. 2. Write down the date, the start time and stop time, and the number of movements that you felt between those two times. Take this information with you to your health care visits. 3. For 2 hours, count kicks, flutters, swishes, rolls, and jabs. You should feel at least 10 movements during 2 hours. 4. You may stop counting after you have felt 10 movements. 5. If you do not feel 10 movements in 2 hours, have something to eat and drink. Then, keep resting and counting for 1 hour. If you feel at least 4 movements during that hour, you may stop counting. Contact a health care provider if:  You feel fewer than 4 movements in 2 hours.  Your baby is not moving like he or she usually does. Date: ____________ Start time: ____________ Stop time: ____________ Movements: ____________ Date: ____________ Start time: ____________ Stop time: ____________ Movements: ____________ Date: ____________ Start time: ____________ Stop time: ____________ Movements: ____________ Date: ____________ Start time: ____________ Stop time: ____________ Movements: ____________ Date: ____________  Start time: ____________ Stop time: ____________ Movements: ____________ Date: ____________ Start time: ____________ Stop time: ____________ Movements: ____________ Date: ____________ Start time: ____________ Stop time: ____________ Movements: ____________ Date: ____________ Start time: ____________ Stop time: ____________ Movements: ____________ Date: ____________ Start time: ____________ Stop time: ____________ Movements: ____________ This information is not intended to replace advice given to you by your health care provider. Make sure you discuss any questions you have with your health care provider. Document Released: 12/22/2006 Document Revised: 12/12/2018 Document Reviewed: 01/01/2016 Elsevier Patient Education  2020 [employer].

## 2019-07-03 ENCOUNTER — Encounter: Payer: Self-pay | Admitting: Certified Nurse Midwife

## 2019-07-04 ENCOUNTER — Inpatient Hospital Stay: Payer: No Typology Code available for payment source | Admitting: Anesthesiology

## 2019-07-04 ENCOUNTER — Other Ambulatory Visit: Payer: Self-pay

## 2019-07-04 ENCOUNTER — Inpatient Hospital Stay
Admission: EM | Admit: 2019-07-04 | Discharge: 2019-07-07 | DRG: 807 | Disposition: A | Discharge status: Home / Self Care | Payer: No Typology Code available for payment source | Attending: Certified Nurse Midwife | Admitting: Certified Nurse Midwife

## 2019-07-04 DIAGNOSIS — O480 Post-term pregnancy: Secondary | ICD-10-CM | POA: Diagnosis not present

## 2019-07-04 DIAGNOSIS — O9089 Other complications of the puerperium, not elsewhere classified: Secondary | ICD-10-CM | POA: Diagnosis not present

## 2019-07-04 DIAGNOSIS — M545 Low back pain: Secondary | ICD-10-CM | POA: Diagnosis not present

## 2019-07-04 DIAGNOSIS — Z3A40 40 weeks gestation of pregnancy: Secondary | ICD-10-CM

## 2019-07-04 DIAGNOSIS — Z3490 Encounter for supervision of normal pregnancy, unspecified, unspecified trimester: Secondary | ICD-10-CM | POA: Diagnosis present

## 2019-07-04 DIAGNOSIS — O26893 Other specified pregnancy related conditions, third trimester: Secondary | ICD-10-CM | POA: Diagnosis present

## 2019-07-04 DIAGNOSIS — R32 Unspecified urinary incontinence: Secondary | ICD-10-CM | POA: Diagnosis not present

## 2019-07-04 DIAGNOSIS — M21372 Foot drop, left foot: Secondary | ICD-10-CM | POA: Diagnosis not present

## 2019-07-04 DIAGNOSIS — Z1159 Encounter for screening for other viral diseases: Secondary | ICD-10-CM | POA: Diagnosis not present

## 2019-07-04 DIAGNOSIS — O326XX0 Maternal care for compound presentation, not applicable or unspecified: Secondary | ICD-10-CM | POA: Diagnosis present

## 2019-07-04 LAB — SARS CORONAVIRUS 2 BY RT PCR (HOSPITAL ORDER, PERFORMED IN ~~LOC~~ HOSPITAL LAB): SARS Coronavirus 2: NEGATIVE

## 2019-07-04 LAB — CBC
HCT: 34.6 % — ABNORMAL LOW (ref 36.0–46.0)
Hemoglobin: 11.4 g/dL — ABNORMAL LOW (ref 12.0–15.0)
MCH: 29.1 pg (ref 26.0–34.0)
MCHC: 32.9 g/dL (ref 30.0–36.0)
MCV: 88.3 fL (ref 80.0–100.0)
Platelets: 182 10*3/uL (ref 150–400)
RBC: 3.92 MIL/uL (ref 3.87–5.11)
RDW: 12.9 % (ref 11.5–15.5)
WBC: 8.6 10*3/uL (ref 4.0–10.5)
nRBC: 0 % (ref 0.0–0.2)

## 2019-07-04 LAB — TYPE AND SCREEN
ABO/RH(D): A POS
Antibody Screen: NEGATIVE

## 2019-07-04 MED ORDER — LIDOCAINE HCL (PF) 1 % IJ SOLN
INTRAMUSCULAR | Status: DC | PRN
  Administered 2019-07-04: 1 mL via INTRADERMAL

## 2019-07-04 MED ORDER — PHENYLEPHRINE 40 MCG/ML (10ML) SYRINGE FOR IV PUSH (FOR BLOOD PRESSURE SUPPORT): 80.0000 ug | PREFILLED_SYRINGE | INTRAVENOUS | Status: DC | PRN

## 2019-07-04 MED ORDER — OXYCODONE-ACETAMINOPHEN 5-325 MG PO TABS: 1.0000 | ORAL_TABLET | ORAL | Status: DC | PRN

## 2019-07-04 MED ORDER — MISOPROSTOL 200 MCG PO TABS
ORAL_TABLET | ORAL | Status: AC
  Administered 2019-07-04: 50 ug via VAGINAL
  Filled 2019-07-04: qty 4

## 2019-07-04 MED ORDER — LIDOCAINE-EPINEPHRINE (PF) 1.5 %-1:200000 IJ SOLN
INTRAMUSCULAR | Status: DC | PRN
  Administered 2019-07-04: 3 mL via EPIDURAL

## 2019-07-04 MED ORDER — LACTATED RINGERS IV SOLN: 500.0000 mL | INTRAVENOUS | Status: DC | PRN

## 2019-07-04 MED ORDER — SODIUM CHLORIDE 0.9 % IV SOLN
INTRAVENOUS | Status: DC | PRN
  Administered 2019-07-04 (×2): 5 mL via EPIDURAL

## 2019-07-04 MED ORDER — OXYCODONE-ACETAMINOPHEN 5-325 MG PO TABS: 2.0000 | ORAL_TABLET | ORAL | Status: DC | PRN

## 2019-07-04 MED ORDER — FENTANYL 2.5 MCG/ML W/ROPIVACAINE 0.15% IN NS 100 ML EPIDURAL (ARMC)
EPIDURAL | Status: AC
  Filled 2019-07-04: qty 100

## 2019-07-04 MED ORDER — EPHEDRINE 5 MG/ML INJ: 10.0000 mg | INTRAVENOUS | Status: DC | PRN

## 2019-07-04 MED ORDER — ZOLPIDEM TARTRATE 5 MG PO TABS: 5.0000 mg | ORAL_TABLET | Freq: Every evening | ORAL | Status: DC | PRN

## 2019-07-04 MED ORDER — SODIUM CHLORIDE 0.9% FLUSH: 3.0000 mL | INTRAVENOUS | Status: DC | PRN

## 2019-07-04 MED ORDER — LIDOCAINE HCL (PF) 1 % IJ SOLN
30.0000 mL | INTRAMUSCULAR | Status: AC | PRN
  Administered 2019-07-05: 30 mL via SUBCUTANEOUS
  Filled 2019-07-04: qty 30

## 2019-07-04 MED ORDER — SODIUM CHLORIDE 0.9% FLUSH: 3.0000 mL | Freq: Two times a day (BID) | INTRAVENOUS | Status: DC

## 2019-07-04 MED ORDER — SODIUM CHLORIDE 0.9 % IV SOLN: 250.0000 mL | INTRAVENOUS | Status: DC | PRN

## 2019-07-04 MED ORDER — ONDANSETRON HCL 4 MG/2ML IJ SOLN
4.0000 mg | Freq: Four times a day (QID) | INTRAMUSCULAR | Status: DC | PRN
  Administered 2019-07-04: 4 mg via INTRAVENOUS
  Filled 2019-07-04: qty 2

## 2019-07-04 MED ORDER — FENTANYL 2.5 MCG/ML W/ROPIVACAINE 0.15% IN NS 100 ML EPIDURAL (ARMC)
12.0000 mL/h | EPIDURAL | Status: DC
  Administered 2019-07-04: 22:00:00 12 mL/h via EPIDURAL
  Filled 2019-07-04: qty 100

## 2019-07-04 MED ORDER — OXYTOCIN 40 UNITS IN NORMAL SALINE INFUSION - SIMPLE MED
2.5000 [IU]/h | INTRAVENOUS | Status: DC
  Filled 2019-07-04: qty 1000

## 2019-07-04 MED ORDER — BUTORPHANOL TARTRATE 2 MG/ML IJ SOLN: 1.0000 mg | INTRAMUSCULAR | Status: DC | PRN

## 2019-07-04 MED ORDER — MISOPROSTOL 50MCG HALF TABLET
50.0000 ug | ORAL_TABLET | ORAL | Status: DC
  Administered 2019-07-04 (×2): 50 ug via VAGINAL
  Filled 2019-07-04 (×2): qty 1

## 2019-07-04 MED ORDER — DIPHENHYDRAMINE HCL 50 MG/ML IJ SOLN: 12.5000 mg | INTRAMUSCULAR | Status: DC | PRN

## 2019-07-04 MED ORDER — LACTATED RINGERS IV SOLN
500.0000 mL | Freq: Once | INTRAVENOUS | Status: AC
  Administered 2019-07-04: 500 mL via INTRAVENOUS

## 2019-07-04 MED ORDER — LACTATED RINGERS IV SOLN
INTRAVENOUS | Status: DC
  Administered 2019-07-04 – 2019-07-05 (×2): via INTRAVENOUS

## 2019-07-04 MED ORDER — OXYTOCIN 40 UNITS IN NORMAL SALINE INFUSION - SIMPLE MED
1.0000 m[IU]/min | INTRAVENOUS | Status: DC
  Administered 2019-07-04: 2 m[IU]/min via INTRAVENOUS
  Filled 2019-07-04: qty 1000

## 2019-07-04 MED ORDER — TERBUTALINE SULFATE 1 MG/ML IJ SOLN: 0.2500 mg | Freq: Once | INTRAMUSCULAR | Status: DC | PRN

## 2019-07-04 MED ORDER — ACETAMINOPHEN 325 MG PO TABS: 650.0000 mg | ORAL_TABLET | ORAL | Status: DC | PRN

## 2019-07-04 MED ORDER — OXYTOCIN BOLUS FROM INFUSION
500.0000 mL | Freq: Once | INTRAVENOUS | Status: AC
  Administered 2019-07-05: 500 mL via INTRAVENOUS

## 2019-07-04 MED ORDER — SOD CITRATE-CITRIC ACID 500-334 MG/5ML PO SOLN: 30.0000 mL | ORAL | Status: DC | PRN

## 2019-07-04 NOTE — Anesthesia Preprocedure Evaluation (Signed)
Anesthesia Evaluation  Patient identified by MRN, date of birth, ID band Patient awake    Reviewed: Allergy & Precautions, H&P , NPO status , Patient's Chart, lab work & pertinent test results  History of Anesthesia Complications Negative for: history of anesthetic complications  Airway Mallampati: III  TM Distance: >3 FB Neck ROM: full    Dental  (+) Chipped   Pulmonary neg pulmonary ROS,           Cardiovascular Exercise Tolerance: Good negative cardio ROS       Neuro/Psych    GI/Hepatic negative GI ROS,   Endo/Other    Renal/GU   negative genitourinary   Musculoskeletal   Abdominal   Peds  Hematology negative hematology ROS (+)   Anesthesia Other Findings History reviewed. No pertinent past medical history.  Past Surgical History: 2006: WISDOM TOOTH EXTRACTION  BMI    Body Mass Index: 29.41 kg/m      Reproductive/Obstetrics (+) Pregnancy                             Anesthesia Physical Anesthesia Plan  ASA: II  Anesthesia Plan: Epidural   Post-op Pain Management:    Induction:   PONV Risk Score and Plan:   Airway Management Planned:   Additional Equipment:   Intra-op Plan:   Post-operative Plan:   Informed Consent: I have reviewed the patients History and Physical, chart, labs and discussed the procedure including the risks, benefits and alternatives for the proposed anesthesia with the patient or authorized representative who has indicated his/her understanding and acceptance.       Plan Discussed with: Anesthesiologist  Anesthesia Plan Comments:         Anesthesia Quick Evaluation

## 2019-07-04 NOTE — Progress Notes (Signed)
LABOR NOTE   Lisa Brady 32 y.o.@ at [redacted]w[redacted]d Early latent labor.  SUBJECTIVE: Using birthing ball and ambulating, contractions are mild.  OBJECTIVE:  BP 111/76   Pulse 77   Temp 98.4 F (36.9 C) (Oral)   Resp 18   Ht 5\' 3"  (1.6 m)   Wt 75.3 kg   LMP 09/22/2018 (Exact Date)   BMI 29.41 kg/m  No intake/output data recorded.  She has shown cervical change. CERVIX: 3 cm:  70%:   -2:   posterior:   firm SVE:   Dilation: 3 Effacement (%): 70 Station: -2 Exam by:: A.Grandville Silos, CNM CONTRACTIONS: regular, every 1-2 minutes FHR: Fetal heart tracing reviewed. Baseline: 130 bpm, Variability: Good {> 6 bpm), Accelerations: Reactive and Decelerations: Absent Category I   Analgesia: Labor support without medications  Labs: Lab Results  Component Value Date   WBC 8.6 07/04/2019   HGB 11.4 (L) 07/04/2019   HCT 34.6 (L) 07/04/2019   MCV 88.3 07/04/2019   PLT 182 07/04/2019    ASSESSMENT: 1) Labor curve reviewed.       Progress: Early latent labor.     Membranes: intact            Active Problems:   Encounter for elective induction of labor   PLAN: continue present management  Philip Aspen, CNM  07/04/2019 12:58 PM

## 2019-07-04 NOTE — Progress Notes (Signed)
LABOR NOTE   Lisa Brady 32 y.o.@ at [redacted]w[redacted]d Early latent labor.  SUBJECTIVE:  States contractions are mild  OBJECTIVE:  BP 119/69   Pulse 85   Temp 98.5 F (36.9 C) (Oral)   Resp 18   Ht 5\' 3"  (1.6 m)   Wt 75.3 kg   LMP 09/22/2018 (Exact Date)   BMI 29.41 kg/m  No intake/output data recorded.  She has shown cervical change. CERVIX: 3-4cm:  70%:   -2:   mid position:   firm SVE:   Dilation: 3.5 Effacement (%): 70 Station: -2 Exam by:: A.Grandville Silos, CNM CONTRACTIONS: regular, every 2 minutes FHR: Fetal heart tracing reviewed. Baseline: 145 bpm, Variability: Good {> 6 bpm), Accelerations: Reactive and Decelerations: Absent Category I  Analgesia: Labor support without medications  Labs: Lab Results  Component Value Date   WBC 8.6 07/04/2019   HGB 11.4 (L) 07/04/2019   HCT 34.6 (L) 07/04/2019   MCV 88.3 07/04/2019   PLT 182 07/04/2019    ASSESSMENT: 1) Labor curve reviewed.       Progress: Early latent labor.     Membranes: ruptured, clear fluid       Active Problems:   Encounter for elective induction of labor   PLAN: continue present management and IV Pitocin augmentation   Philip Aspen, CNM  07/04/2019 7:29 PM

## 2019-07-04 NOTE — Progress Notes (Signed)
LABOR NOTE   Lisa Brady 32 y.o.@ at [redacted]w[redacted]d Early latent labor.  SUBJECTIVE:  Comfortable with epidural  OBJECTIVE:  BP 113/61   Pulse 79   Temp 97.6 F (36.4 C) (Oral)   Resp 18   Ht 5\' 3"  (1.6 m)   Wt 75.3 kg   LMP 09/22/2018 (Exact Date)   SpO2 98%   BMI 29.41 kg/m  No intake/output data recorded.  She has shown cervical change. CERVIX: 4cm:  80%:   -2:   mid position:   firm SVE:   Dilation: 3.5 Effacement (%): 80 Station: -2 Exam by:: A.Grandville Silos, CNM CONTRACTIONS: regular, every 2-3 minutes FHR: Fetal heart tracing reviewed. Baseline: 125 bpm, Variability: Good {> 6 bpm), Accelerations: Reactive and Decelerations: Absent Category I   Analgesia: Epidural  Labs: Lab Results  Component Value Date   WBC 8.6 07/04/2019   HGB 11.4 (L) 07/04/2019   HCT 34.6 (L) 07/04/2019   MCV 88.3 07/04/2019   PLT 182 07/04/2019    ASSESSMENT: 1) Labor curve reviewed.       Progress: Early latent labor.     Membranes: ruptured, clear fluid, IUPC placed        Active Problems:   Encounter for elective induction of labor   PLAN: continue present management and IV Pitocin induction   Philip Aspen, CNM  07/04/2019 10:41 PM

## 2019-07-04 NOTE — H&P (Signed)
History and Physical   HPI  Lisa Brady is a 33 y.o. G1P0 at [redacted]w[redacted]d Estimated Date of Delivery: 06/29/19 who is being admitted for induction of labor.    OB History  OB History  Gravida Para Term Preterm AB Living  1 0 0 0 0 0  SAB TAB Ectopic Multiple Live Births  0 0 0 0 0    # Outcome Date GA Lbr Len/2nd Weight Sex Delivery Anes PTL Lv  1 Current             PROBLEM LIST  Pregnancy complications or risks: Patient Active Problem List   Diagnosis Date Noted  . Encounter for elective induction of labor 07/04/2019  . Maternal varicella, non-immune 11/20/2018    Prenatal labs and studies: ABO, Rh: --/--/A POS (07/29 4287) Antibody: NEG (07/29 0620) Rubella: 1.32 (12/13 1054) RPR: Non Reactive (04/17 1050)  HBsAg: Negative (12/13 1054)  HIV: Non Reactive (12/13 1054)  GOT:LXBWIOMB (06/26 1209)   History reviewed. No pertinent past medical history.   Past Surgical History:  Procedure Laterality Date  . WISDOM TOOTH EXTRACTION  2006     Medications    Current Discharge Medication List    CONTINUE these medications which have NOT CHANGED   Details  acetaminophen (TYLENOL) 500 MG tablet Take 1,000 mg by mouth every 6 (six) hours as needed.    Prenatal Vit-Fe Fumarate-FA (PRENATAL MULTIVITAMIN) TABS tablet Take 1 tablet by mouth daily at 12 noon.         Allergies  Patient has no known allergies.  Review of Systems  Constitutional: negative Eyes: negative Ears, nose, mouth, throat, and face: negative Respiratory: negative Cardiovascular: negative Gastrointestinal: negative Genitourinary:negative Integument/breast: negative Hematologic/lymphatic: negative Musculoskeletal:negative Neurological: negative Behavioral/Psych: negative Endocrine: negative Allergic/Immunologic: negative  Physical Exam  BP 116/66   Pulse 83   Temp 98.4 F (36.9 C) (Oral)   Resp 18   Ht 5\' 3"  (1.6 m)   Wt 75.3 kg   LMP 09/22/2018 (Exact Date)   BMI 29.41  kg/m   Lungs:  CTA B Cardio: RRR  Abd: Soft, gravid, NT Presentation: cephalic EXT: No C/C/ 1+ Edema DTRs: 2+ B CERVIX: Dilation: Closed Effacement (%): 40, 50 Station: -3 Presentation: Vertex Exam by:: Sherrye Payor Rn   See Prenatal records for more detailed PE.     FHR:  Baseline: 130 bpm, Variability: Good {> 6 bpm), Accelerations: Reactive and Decelerations: Absent  Toco: Uterine Contractions: irregular   Test Results  Results for orders placed or performed during the hospital encounter of 07/04/19 (from the past 24 hour(s))  CBC     Status: Abnormal   Collection Time: 07/04/19  5:20 AM  Result Value Ref Range   WBC 8.6 4.0 - 10.5 K/uL   RBC 3.92 3.87 - 5.11 MIL/uL   Hemoglobin 11.4 (L) 12.0 - 15.0 g/dL   HCT 34.6 (L) 36.0 - 46.0 %   MCV 88.3 80.0 - 100.0 fL   MCH 29.1 26.0 - 34.0 pg   MCHC 32.9 30.0 - 36.0 g/dL   RDW 12.9 11.5 - 15.5 %   Platelets 182 150 - 400 K/uL   nRBC 0.0 0.0 - 0.2 %  Type and screen     Status: None (Preliminary result)   Collection Time: 07/04/19  5:20 AM  Result Value Ref Range   ABO/RH(D) PENDING    Antibody Screen PENDING    Sample Expiration      07/07/2019,2359 Performed at [facility], [address] [place], [place] [address]   SARS Coronavirus 2 (CEPHEID - Performed in [place] hospital lab), Hosp Order     Status: None   Collection Time: 07/04/19  5:24 AM   Specimen: Nasopharyngeal Swab  Result Value Ref Range   SARS Coronavirus 2 NEGATIVE NEGATIVE  Type and screen     Status: None   Collection Time: 07/04/19  6:20 AM  Result Value Ref Range   ABO/RH(D) A POS    Antibody Screen NEG    Sample Expiration      07/07/2019,2359 Performed at [facility], [place], [place] [address]    Group B Strep negative  Assessment   G1P0 at [redacted]w[redacted]d Estimated Date of Delivery: 06/29/19  The fetus is reassuring.   Patient Active Problem List   Diagnosis Date Noted  . Encounter for  elective induction of labor 07/04/2019  . Maternal varicella, non-immune 11/20/2018    Plan  1. Admit to L&D :   [facility] 2. EFM:-- Category 1 3. Epidural if desired.  Stadol for IV pain until epidural requested. 4. Admission labs completed 5. Plan of care reviewed with pt and her partner , they verbalizes and agree to plan.   Philip Aspen, CNM  07/04/2019 8:04 AM

## 2019-07-04 NOTE — Anesthesia Procedure Notes (Signed)
Epidural Patient location during procedure: OB Start time: 07/04/2019 10:08 PM End time: 07/04/2019 10:13 PM  Staffing Anesthesiologist: Taim Wurm, Precious Haws, MD Performed: anesthesiologist   Preanesthetic Checklist Completed: patient identified, site marked, surgical consent, pre-op evaluation, timeout performed, IV checked, risks and benefits discussed and monitors and equipment checked  Epidural Patient position: sitting Prep: ChloraPrep Patient monitoring: heart rate, continuous pulse ox and blood pressure Approach: midline Location: L3-L4 Injection technique: LOR saline  Needle:  Needle type: Tuohy  Needle gauge: 17 G Needle length: 9 cm and 9 Needle insertion depth: 6 cm Catheter type: closed end flexible Catheter size: 19 Gauge Catheter at skin depth: 11 cm Test dose: negative and 1.5% lidocaine with Epi 1:200 K  Assessment Sensory level: T10 Events: blood not aspirated, injection not painful, no injection resistance, negative IV test and no paresthesia  Additional Notes 1 attempt.  Patient endorsed transient right leg paresthesia with catheter placement, catheter retracted, needle rotated counter clockwise and catheter replaced with no paresthesia endorsed. Pt. Evaluated and documentation done after procedure finished. Patient identified. Risks/Benefits/Options discussed with patient including but not limited to bleeding, infection, nerve damage, paralysis, failed block, incomplete pain control, headache, blood pressure changes, nausea, vomiting, reactions to medication both or allergic, itching and postpartum back pain. Confirmed with bedside nurse the patient's most recent platelet count. Confirmed with patient that they are not currently taking any anticoagulation, have any bleeding history or any family history of bleeding disorders. Patient expressed understanding and wished to proceed. All questions were answered. Sterile technique was used throughout the entire  procedure. Please see nursing notes for vital signs. Test dose was given through epidural catheter and negative prior to continuing to dose epidural or start infusion. Warning signs of high block given to the patient including shortness of breath, tingling/numbness in hands, complete motor block, or any concerning symptoms with instructions to call for help. Patient was given instructions on fall risk and not to get out of bed. All questions and concerns addressed with instructions to call with any issues or inadequate analgesia.   Patient tolerated the insertion well without immediate complications.Reason for block:procedure for pain

## 2019-07-05 LAB — RPR: RPR Ser Ql: NONREACTIVE

## 2019-07-05 MED ORDER — MISOPROSTOL 200 MCG PO TABS
800.0000 ug | ORAL_TABLET | Freq: Once | ORAL | Status: AC
  Administered 2019-07-05: 800 ug via VAGINAL

## 2019-07-05 MED ORDER — METHYLERGONOVINE MALEATE 0.2 MG/ML IJ SOLN: 0.2000 mg | INTRAMUSCULAR | Status: DC | PRN

## 2019-07-05 MED ORDER — OXYCODONE-ACETAMINOPHEN 5-325 MG PO TABS: 1.0000 | ORAL_TABLET | ORAL | Status: DC | PRN

## 2019-07-05 MED ORDER — ACETAMINOPHEN 325 MG PO TABS
650.0000 mg | ORAL_TABLET | ORAL | Status: DC | PRN
  Administered 2019-07-05 – 2019-07-06 (×4): 650 mg via ORAL
  Filled 2019-07-05 (×4): qty 2

## 2019-07-05 MED ORDER — CALCIUM CARBONATE ANTACID 500 MG PO CHEW
1.0000 | CHEWABLE_TABLET | Freq: Once | ORAL | Status: AC
  Administered 2019-07-05: 200 mg via ORAL
  Filled 2019-07-05: qty 1

## 2019-07-05 MED ORDER — WITCH HAZEL-GLYCERIN EX PADS: 1.0000 "application " | MEDICATED_PAD | CUTANEOUS | Status: DC | PRN

## 2019-07-05 MED ORDER — SIMETHICONE 80 MG PO CHEW: 80.0000 mg | CHEWABLE_TABLET | ORAL | Status: DC | PRN

## 2019-07-05 MED ORDER — FERROUS SULFATE 325 (65 FE) MG PO TABS
325.0000 mg | ORAL_TABLET | Freq: Every day | ORAL | Status: DC
  Administered 2019-07-06 – 2019-07-07 (×2): 325 mg via ORAL
  Filled 2019-07-05 (×2): qty 1

## 2019-07-05 MED ORDER — IBUPROFEN 600 MG PO TABS
600.0000 mg | ORAL_TABLET | Freq: Four times a day (QID) | ORAL | Status: DC
  Administered 2019-07-05 – 2019-07-07 (×10): 600 mg via ORAL
  Filled 2019-07-05 (×10): qty 1

## 2019-07-05 MED ORDER — BENZOCAINE-MENTHOL 20-0.5 % EX AERO
1.0000 "application " | INHALATION_SPRAY | CUTANEOUS | Status: DC | PRN
  Filled 2019-07-05: qty 56

## 2019-07-05 MED ORDER — ONDANSETRON HCL 4 MG PO TABS: 4.0000 mg | ORAL_TABLET | ORAL | Status: DC | PRN

## 2019-07-05 MED ORDER — COCONUT OIL OIL
1.0000 "application " | TOPICAL_OIL | Status: DC | PRN
  Filled 2019-07-05: qty 120

## 2019-07-05 MED ORDER — DOCUSATE SODIUM 100 MG PO CAPS
100.0000 mg | ORAL_CAPSULE | Freq: Two times a day (BID) | ORAL | Status: DC
  Administered 2019-07-05 – 2019-07-07 (×4): 100 mg via ORAL
  Filled 2019-07-05 (×4): qty 1

## 2019-07-05 MED ORDER — ONDANSETRON HCL 4 MG/2ML IJ SOLN: 4.0000 mg | INTRAMUSCULAR | Status: DC | PRN

## 2019-07-05 MED ORDER — OXYCODONE-ACETAMINOPHEN 5-325 MG PO TABS: 2.0000 | ORAL_TABLET | ORAL | Status: DC | PRN

## 2019-07-05 MED ORDER — METHYLERGONOVINE MALEATE 0.2 MG PO TABS: 0.2000 mg | ORAL_TABLET | ORAL | Status: DC | PRN

## 2019-07-05 MED ORDER — CALCIUM CARBONATE ANTACID 500 MG PO CHEW
400.0000 mg | CHEWABLE_TABLET | Freq: Once | ORAL | Status: AC
  Administered 2019-07-05: 400 mg via ORAL
  Filled 2019-07-05: qty 2

## 2019-07-05 MED ORDER — PRENATAL MULTIVITAMIN CH
1.0000 | ORAL_TABLET | Freq: Every day | ORAL | Status: DC
  Administered 2019-07-06 – 2019-07-07 (×2): 1 via ORAL
  Filled 2019-07-05 (×2): qty 1

## 2019-07-05 MED ORDER — METHYLERGONOVINE MALEATE 0.2 MG/ML IJ SOLN
INTRAMUSCULAR | Status: AC
  Filled 2019-07-05: qty 1

## 2019-07-05 MED ORDER — DIBUCAINE (PERIANAL) 1 % EX OINT: 1.0000 "application " | TOPICAL_OINTMENT | CUTANEOUS | Status: DC | PRN

## 2019-07-05 NOTE — Progress Notes (Signed)
Received verbal order to go up to 30 of pitocin. Will increase at 0300.

## 2019-07-05 NOTE — Progress Notes (Signed)
LABOR NOTE   Lisa Brady 33 y.o.@ at [redacted]w[redacted]d Active phase labor.Pushing x 2 hrs with minimal descent from 1+ station to 2+ over 2 hrs of pushing. Pt is exhausted and pushing effort is declining.   SUBJECTIVE:  Pushing  OBJECTIVE:  BP 126/61 (BP Location: Left Arm)   Pulse 82   Temp 98.6 F (37 C) (Oral)   Resp 18   Ht 5\' 3"  (1.6 m)   Wt 75.3 kg   LMP 09/22/2018 (Exact Date)   SpO2 98%   Breastfeeding Unknown   BMI 29.41 kg/m  Total I/O In: -  Out: 800 [Urine:800]  SVE:   Dilation: 10 Effacement (%): 100 Station: Plus 1 Exam by:: JKL CONTRACTIONS: irregular, every 3 minutes FHR: Fetal heart tracing reviewed. Baseline: 120 bpm, Variability: Good {> 6 bpm), Accelerations: Reactive and Decelerations: late and variable with pushing  Category II   Analgesia: Epidural  Labs: Lab Results  Component Value Date   WBC 8.6 07/04/2019   HGB 11.4 (L) 07/04/2019   HCT 34.6 (L) 07/04/2019   MCV 88.3 07/04/2019   PLT 182 07/04/2019    ASSESSMENT: 1) Labor curve reviewed.       Progress: Active phase labor.     Membranes: ruptured, clear fluid           Active Problems:   Encounter for elective induction of labor   PLAN: Dr. Marcelline Mates called to evaluate for vacuum assisted delivery. Continue to push.    Philip Aspen, CNM  07/05/2019 12:11 PM

## 2019-07-05 NOTE — Progress Notes (Signed)
LABOR NOTE   Domingue Coltrain Nowling 33 y.o.@ at [redacted]w[redacted]d Active phase labor.  SUBJECTIVE:  Pressure and urge to push OBJECTIVE:  BP 126/61 (BP Location: Left Arm)   Pulse 82   Temp 98.6 F (37 C) (Oral)   Resp 18   Ht 5\' 3"  (1.6 m)   Wt 75.3 kg   LMP 09/22/2018 (Exact Date)   SpO2 98%   Breastfeeding Unknown   BMI 29.41 kg/m  Total I/O In: -  Out: 800 [Urine:800]  She has shown cervical change. CERVIX: Complete plus one. SVE:   Dilation: 10 Effacement (%): 100 Station: Plus 1 Exam by:: JKL CONTRACTIONS: 2-3 min FHR: Fetal heart tracing reviewed. Baseline: 120 bpm, Variability: Good {> 6 bpm), Accelerations: Reactive and Decelerations: Absent Category I   Analgesia: Epidural  Labs: Lab Results  Component Value Date   WBC 8.6 07/04/2019   HGB 11.4 (L) 07/04/2019   HCT 34.6 (L) 07/04/2019   MCV 88.3 07/04/2019   PLT 182 07/04/2019    ASSESSMENT: 1) Labor curve reviewed.       Progress: Active phase labor.     Membranes: ruptured, clear fluid     Active Problems:   Encounter for elective induction of labor   PLAN: continue present management and start Banks, CNM  07/05/2019

## 2019-07-05 NOTE — Progress Notes (Signed)
LABOR NOTE   Lisa Brady 32 y.o.@ at [redacted]w[redacted]d Early latent labor.  SUBJECTIVE:  Comfortable with epidural. Denies pressure at this time.  OBJECTIVE:  BP 107/68   Pulse 67   Temp 97.9 F (36.6 C) (Oral)   Resp 18   Ht 5\' 3"  (1.6 m)   Wt 75.3 kg   LMP 09/22/2018 (Exact Date)   SpO2 100%   BMI 29.41 kg/m  No intake/output data recorded.   No exam done at this time  CERVIX: Deferred  SVE:   Dilation: 5.5 Effacement (%): 90 Station: -2, -1 Exam by:: Chalmers Guest RN CONTRACTIONS: regular, every 2-3 minutes FHR: Fetal heart tracing reviewed. Baseline: 120 bpm, Variability: Good {> 6 bpm), Accelerations: Reactive and Decelerations: Absent Category I   Analgesia: Epidural  Labs: Lab Results  Component Value Date   WBC 8.6 07/04/2019   HGB 11.4 (L) 07/04/2019   HCT 34.6 (L) 07/04/2019   MCV 88.3 07/04/2019   PLT 182 07/04/2019    ASSESSMENT: 1) Labor curve reviewed.       Progress: Early latent labor.     Membranes: ruptured, clear fluid           Active Problems:   Encounter for elective induction of labor   PLAN: continue present management   Philip Aspen, CNM  07/05/2019 4:08 AM

## 2019-07-06 LAB — CBC
HCT: 29 % — ABNORMAL LOW (ref 36.0–46.0)
Hemoglobin: 9.6 g/dL — ABNORMAL LOW (ref 12.0–15.0)
MCH: 28.8 pg (ref 26.0–34.0)
MCHC: 33.1 g/dL (ref 30.0–36.0)
MCV: 87.1 fL (ref 80.0–100.0)
Platelets: 170 10*3/uL (ref 150–400)
RBC: 3.33 MIL/uL — ABNORMAL LOW (ref 3.87–5.11)
RDW: 13.2 % (ref 11.5–15.5)
WBC: 15.2 10*3/uL — ABNORMAL HIGH (ref 4.0–10.5)
nRBC: 0 % (ref 0.0–0.2)

## 2019-07-06 NOTE — Evaluation (Signed)
Physical Therapy Evaluation Patient Details Name: Lisa Brady MRN: [id number] DOB: 12/23/1985 Today's Date: 07/06/2019   History of Present Illness  Lisa Brady is a 18yoF who comes to [facility] for induced labor, noticed a Left foot drop after delivery once epidural effects had worn off. PT called to evaluate as pt is having difficulty with AMB. 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  Assessment  Pt admitted with above diagnosis. Pt currently with functional limitations due to the deficits listed below (see "PT Problem List"). Upon entry, pt in bed, awake and agreeable to participate. Baby in arm, husband in room. The pt is alert and oriented x4, pleasant, conversational, and generally a good historian.  Patient report:  Pt reports some "zinger" phenomenon on the right leg with administration or epidural, but no LLE symptoms. Pt reports after several hours labor, epidural anesthesia was 'turned down a bit' to facilitate improved proprioception required for pushing and delivery of baby. She reports at this time noting some Left sided achiness/pain in the low back, left gluteals, and left hamstrings. Roughly an hour after delivery, pt reports full return of function in RLE, but Left leg heaviness, loss of ankle dorsiflexion, and pins/needles sensation in the left ankle/foot. At time of PT evaluation (~15hrs later) pt reports some mild improvements in total leg heaviness, but no improvement in foot drop. She also reports gross urinary incontinence with transition from sitting to standing, reporting total loss of ability to activate muscles that cease the flow of urine. She denies gross saddle paresthesia, as she has some suture related pain. She has not had a full bowel movement since delivery and cannot report on bowel incontinence.  MMT:  RLE WNL/intact LLE: hip horizontal abduction (glute max): 4+/5; knee extension: 5/5 (subjectively week); knee flexion 4/5; ankle dorsiflexion: 3-/5 (accessory  movement, likely from hallux extension); Tibialis Anterior: no palpable twitch with cued DF; Hallux extension: WNL; gross small toe extension: absent movement; small toe extension: >75% A/ROM (no resistance applied); ankle plantar flexion: 5/5 (not tested in standing)  Sensation: intact in all dermatomes; pt has "pins and needles with light touch sensation testing the at Left medial foreleg, dorsal foot, plantar foot, and lateral foot. Intact and WNL at L1-L3, posterior thigh and calf.   Slump Test (Lumbar Radiculopathy): Negative for pain or symptoms change; "calf stretch" only.    Functional mobility assessment: Pt demonstrates increased effort/time requirements, good tolerance, but no frank need for physical assistance, whereas the patient performed these at a higher level of independence PTA. Pt requires a RW for independent AMB and can perform independent stairs with railing.   Other: continued gross urinary incontinence without ability to activate pelvic floor muscles; denies any concerning acute low back pain; 10-20% > pedal edema on LLE.   Impression: No definitive medical diagnosis available at this time. Anesthesiology has seen the patient, and per pt report reports low likelihood of current presentation being related to epidural administration, however they did offer a prospective timeline for recovery in the event that it was resultant of epidural. Medical team has also offered peroneal nerve injury v lumbosacral plexus injury as differentials, the former which is inconsistent with patient presentation as there is clearly motor involvement outside of the peroneal nerve distribution. Input from neurology would offer the most accurate prognosis for recovery. Prolonged motor palsy would put the patient at greater risk of muscle atrophy, an issue that outpatient PT could assist with while the patient recovers. Pt will benefit from skilled PT  intervention to increase independence and safety with  basic mobility in preparation for discharge to the venue listed below.       Follow Up Recommendations Outpatient PT    Equipment Recommendations  Rolling walker with 5" wheels;3in1 (PT)    Recommendations for Other Services Other (comment)(neuroconsult)     Precautions / Restrictions Precautions Precautions: None Restrictions Weight Bearing Restrictions: No      Mobility  Bed Mobility Overal bed mobility: Modified Independent             General bed mobility comments: additional time and effort needed, appears safe, calm, and confident.  Transfers Overall transfer level: Modified independent Equipment used: Rolling walker (2 wheeled)             General transfer comment: additional time and effort needed, appears safe, calm, and confident.  Ambulation/Gait Ambulation/Gait assistance: Modified independent (Device/Increase time) Gait Distance (Feet): 240 Feet Assistive device: Rolling walker (2 wheeled) Gait Pattern/deviations: Step-through pattern;Steppage     General Gait Details: Left steppage gait with poor foot clearance and no active ankle DF.  Stairs Stairs: Yes Stairs assistance: Min guard Stair Management: One rail Right;Step to pattern;Forwards Number of Stairs: 15 General stair comments: "Up with good, down with bad", however tries 2 steps up with Left leg to evaluate funcitonal hip/knee extension strength which is intact but subjectively weak.  Wheelchair Mobility    Modified Rankin (Stroke Patients Only)       Balance Overall balance assessment: Modified Independent;Mild deficits observed, not formally tested                                           Pertinent Vitals/Pain Pain Assessment: No/denies pain(general postpartum low back discomfort)    Home Living Family/patient expects to be discharged to:: Private residence Living Arrangements: Spouse/significant other Available Help at Discharge: Family Type of  Home: House Home Access: Stairs to enter Entrance Stairs-Rails: Right Entrance Stairs-Number of Steps: 5 Home Layout: Two level Home Equipment: None      Prior Function Level of Independence: Independent         Comments: 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     Hand Dominance        Extremity/Trunk Assessment        Lower Extremity Assessment Lower Extremity Assessment: LLE deficits/detail LLE Deficits / Details: *see note for detail LLE Sensation: WNL("pins and needles" at L4, L5 distribution of medial foreleg, dorsal foot, and plantar foot.)       Communication   Communication: No difficulties  Cognition Arousal/Alertness: Awake/alert Behavior During Therapy: WFL for tasks assessed/performed Overall Cognitive Status: Within Functional Limits for tasks assessed                                        General Comments General comments (skin integrity, edema, etc.): feels to have some knee buckling instability in stance, but likely an overactive calf without dorsiflexion stabilization of the knee.    Exercises     Assessment/Plan    PT Assessment Patient needs continued PT services  PT Problem List Decreased strength;Decreased activity tolerance;Decreased balance;Decreased mobility       PT Treatment Interventions DME instruction;Gait training;Stair training;Functional mobility training;Therapeutic activities;Therapeutic exercise;Patient/family education  PT Goals (Current goals can be found in the Care Plan section)  Acute Rehab PT Goals Patient Stated Goal: return to home with baby, feel safe AMB In home PT Goal Formulation: With patient Time For Goal Achievement: 07/20/19 Potential to Achieve Goals: Good    Frequency Min 2X/week   Barriers to discharge        Co-evaluation               AM-PAC PT "6 Clicks" Mobility  Outcome Measure Help needed turning from  your back to your side while in a flat bed without using bedrails?: None Help needed moving from lying on your back to sitting on the side of a flat bed without using bedrails?: None Help needed moving to and from a bed to a chair (including a wheelchair)?: A Little Help needed standing up from a chair using your arms (e.g., wheelchair or bedside chair)?: A Little Help needed to walk in hospital room?: A Little Help needed climbing 3-5 steps with a railing? : A Little 6 Click Score: 20    End of Session Equipment Utilized During Treatment: Gait belt Activity Tolerance: Patient tolerated treatment well;No increased pain Patient left: in bed;with family/visitor present;with call bell/phone within reach Nurse Communication: Mobility status;Precautions PT Visit Diagnosis: Unsteadiness on feet (R26.81);Difficulty in walking, not elsewhere classified (R26.2);Other symptoms and signs involving the nervous system (R29.898);Muscle weakness (generalized) (M62.81)    Time: 8016-5537 PT Time Calculation (min) (ACUTE ONLY): 51 min   Charges:   PT Evaluation $PT Eval Moderate Complexity: 1 Mod PT Treatments $Gait Training: 23-37 mins       5:46 PM, 07/06/19 Etta Grandchild, PT, DPT Physical Therapist - [facility]  [phone] ([place])   , C 07/06/2019, 5:21 PM

## 2019-07-06 NOTE — Progress Notes (Signed)
Clinical Education officer, museum (CSW) was notified by PT that patient needs a rolling walker and bedside commode. CSW sent [employer] agency representative an email making him aware of above.   [employer], LCSW [phone]

## 2019-07-06 NOTE — Consult Note (Signed)
33 year old female who had epidural placement on 7/29 at 22:31 and then delivered around noon on 7/30 with vacuum assistance.  Epidural was placed with 1 attempt, during epidural placement the patient endorsed transient right leg paresthesia with catheter placement, the catheter was retracted, needle rotated counter clockwise and epidural catheter was replaced with no paresthesia endorsed.  Patient did endorse a stronger feeling of numbness in right leg after epidural infusion was started.  Patient did not endorses any left sided symptoms with epidural placement or during epidural use.  Patient now endorses urinary incontinence while standing that resolves when seated in bed and supine.  Pt also endorses left sided foot drop.  Patient endorses no other neurologic symptoms in her lower extremities.  Patient reports that she can now stand and walk but is limited by her left foot drop.  Patient and L&D nurse both report that patient was pushing for vaginal delivery in stage 2 labor for 3-4 hours.  Patient also received vacuum assisted delivery. Patient also reports that her foley catheter either fell out or was removed while she was in stage 2 labor and she thinks that the foley balloon was still inflated when her foley catheter came out.  Patient is grossly neurologically intact in her lower extremities other than weakness in left foot with dorsiflexion.  Left sided foot drop is most likely from prolonged stage 2 labor leading to a peroneal nerve injury from positioning and is most likely not related to epidural catheter placement.  Her urinary incontinence is also most likely not related to her epidural placement and more likely related to birth trauma and or her foley catheter removal.  Patient may benefit from a urology consult if her symptoms do not continue to improve.

## 2019-07-06 NOTE — Anesthesia Postprocedure Evaluation (Signed)
Anesthesia Post Note  Patient: [name]  Procedure(s) Performed: AN AD Doon  Patient location during evaluation: Mother Baby Anesthesia Type: Epidural Level of consciousness: awake, awake and alert and oriented Pain management: pain level controlled Vital Signs Assessment: post-procedure vital signs reviewed and stable Respiratory status: spontaneous breathing, nonlabored ventilation and respiratory function stable Cardiovascular status: stable Postop Assessment: no headache, no backache, no apparent nausea or vomiting, patient able to bend at knees and adequate PO intake Comments: Patient c/o incontinence while standing but resolves when in bed and lying down.  Pt also c/o unilateral  foot drop.  Time for labor and pushing in lithotomy position for vaginal delivery was for an extended time according to L&D RN.  Report given to Joe Piscitello MDA this morning.  Will continue to monitor.       Last Vitals:  Vitals:   07/06/19 0028 07/06/19 0406  BP: (!) 101/59 111/63  Pulse: 69 71  Resp: 20 20  Temp: 36.5 C 36.8 C  SpO2: 98% 100%    Last Pain:  Vitals:   07/06/19 0800  TempSrc:   PainSc: 4                  Shemiah Rosch,  [place] R

## 2019-07-06 NOTE — Progress Notes (Signed)
Post Partum Day 1 Subjective: reports continued urinary incontenance with standing, no increase in urge, but does now feel when her bladder is full. mild low back pain and tailbone/perineal pain, continue left foot drop without pain.    Objective: Blood pressure 108/69, pulse 77, temperature 97.7 F (36.5 C), temperature source Oral, resp. rate 20, height 5\' 3"  (1.6 m), weight 75.3 kg, last menstrual period 09/22/2018, SpO2 100 %, unknown if currently breastfeeding.  Physical Exam:  General: alert, cooperative, appears stated age and pale Lochia: appropriate Uterine Fundus: firm Incision: NA DVT Evaluation: No evidence of DVT seen on physical exam. Negative Homan's sign. No cords or calf tenderness. No significant calf/ankle edema. Left foot drop noted  Recent Labs    07/04/19 0520 07/06/19 0635  HGB 11.4* 9.6*  HCT 34.6* 29.0*    Assessment/Plan: Lactation consult Infant feeding Breast only, with slight nipple tenderness noted due to strong latch. Anesthesia has evaluated patient and does not feel issues are associated with epidural.  Will order PT consult and walker for mobilization, Kpad for cool to low back, scheduled continuous motrin, and bladder training with attempted void every 2 hours. D/c IV site. May shower after PT assessment.     LOS: 2 days   Melody N Shambley 07/06/2019, 8:53 AM

## 2019-07-06 NOTE — Progress Notes (Signed)
Post Partum Day 1 Subjective: up with PT on walker, continues to have large amount of urine leakage when standing with no ability to control urine flow, no pain or increased bleeding.  Objective: Blood pressure 108/69, pulse 77, temperature 97.7 F (36.5 C), temperature source Oral, resp. rate 20, height 5\' 3"  (1.6 m), weight 75.3 kg, last menstrual period 09/22/2018, SpO2 100 %, unknown if currently breastfeeding.  Physical Exam:  General: alert, cooperative, appears stated age, fatigued and tearful Lochia: appropriate Uterine Fundus: firm DVT Evaluation: No evidence of DVT seen on physical exam.  Recent Labs    07/04/19 0520 07/06/19 0635  HGB 11.4* 9.6*  HCT 34.6* 29.0*    Assessment/Plan: Lactation consult and Social Work consult done, Neurology consult placed at suggestion of PT with concern for etiology other than pelvic floor muscle/nerve trauma as cause of foot drop & urinary incontinence. Dr Amalia Hailey aware and agrees with POC, along with suggestion of foley insertion for the night. Discussed plan with patient and spouse and they are in agreement.     LOS: 2 days   Melody N Shambley 07/06/2019, 5:37 PM

## 2019-07-06 NOTE — Plan of Care (Signed)
Pt is complaining of limited dorsal flexion in left foot and urine incontinence.  Dr. Rosey Bath and Dr. Marcelline Mates notified at the beginning of shift.  Dr. 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  Dr. Marcelline Mates stated to continue to monitor and discuss plan of care in the am.  Discussed with patient to get up out of bed more frequently today to empty her bladder, pt stated she would like a walker to ambulate in the room.

## 2019-07-06 NOTE — Lactation Note (Addendum)
This note was copied from a baby's chart. Lactation Consultation Note  Patient Name: Lisa Brady Date: 07/06/2019 Reason for consult: Follow-up assessment;Mother's request;Primapara;Term;Other (Comment)(Cluster feeding)  Lisa Brady was sleepy for first 4 to 5 hours after circumcision.  After 5 hours he woke up and wanted to cluster feed.  Mom experiencing extreme pain on initial latch, but eases up into feeding.  Mom has a red blood blister on each nipple now, but is more prominent on left nipple.  Reviewed transient nipple tenderness, but encouraged to call if started hurting through entire breast feed or if pain worsened.  Observed Leland latching with minimal assistance and beginning really strong rhythmic sucking with wide open mouth and flanged lips.  Taught hand expression after breast feeding to rub colostrum on nipple.  Coconut oil and comfort gels given and instructed in rotating use.  Mom had been breast feeding in cross cradle hold. We switched to football hold which mom says has helped some with friction on different area of nipple.  We discussed not letting him pinch or suck on tip of nipple which mom says he was doing earlier towards the end of the feeding.  He was slipping to the tip of the nipple and causing a lot more pain.  Mom knows the difference and will break the suction when she feels him getting shallow latch.  Father of baby started letting him suck on his finger after Lisa Brady had been switching sides for over an hour and a half.  He is asking for a pacifier.  Pacifier given after instructed in risks of introducing pacifier too early and told him to watch how he does when he goes back to the breast after sucking on pacifier.  Once pacifier was given, he sucked for a short period and fell asleep.  Explained newborn feeding cues and encouraged to put him to the breast anytime he demonstrated hunger cues and only introduce pacifier after they knew he had a good breast feed.  [place]  DEBP given through [employer] and reviewed when to begin pumping and when to introduce bottle.  Discussed collection and storage guidelines and magnet for refrigerator given.  Reviewed supply and demand, normal course of lactation, newborn stomach size and routine newborn feeding patterns.  Lactation name and number written on white board and encouraged to call with any questions, concerns or assistance. Maternal Data Formula Feeding for Exclusion: No Has patient been taught Hand Expression?: Yes(Can easily hand express colostrum) Does the patient have breastfeeding experience prior to this delivery?: No  Feeding Feeding Type: Breast Fed  LATCH Score Latch: Grasps breast easily, tongue down, lips flanged, rhythmical sucking.  Audible Swallowing: A few with stimulation  Type of Nipple: Everted at rest and after stimulation  Comfort (Breast/Nipple): Engorged, cracked, bleeding, large blisters, severe discomfort(Pain on initial latch)  Hold (Positioning): Assistance needed to correctly position infant at breast and maintain latch.  LATCH Score: 6  Interventions Interventions: Breast feeding basics reviewed;Assisted with latch;Skin to skin;Breast massage;Hand express;Breast compression;Adjust position;Support pillows;Position options;Coconut oil;Comfort gels  Lactation Tools Discussed/Used WIC Program: [employer] - Dr. at [employer] ) Pump Review: Setup, frequency, and cleaning;Milk Storage;Other (comment) Initiated by:: (Given, but have not used) Date initiated:: (Given 07/06/2019)   Consult Status Consult Status: Follow-up Follow-up type: Call as needed    Jarold Motto 07/06/2019, 4:45 PM

## 2019-07-07 MED ORDER — NORETHINDRONE 0.35 MG PO TABS: 1.0000 | ORAL_TABLET | Freq: Every day | ORAL | 11 refills | Status: DC

## 2019-07-07 MED ORDER — DOCUSATE SODIUM 100 MG PO CAPS: 100.0000 mg | ORAL_CAPSULE | Freq: Two times a day (BID) | ORAL | 0 refills | Status: DC

## 2019-07-07 MED ORDER — IBUPROFEN 600 MG PO TABS: 600.0000 mg | ORAL_TABLET | Freq: Four times a day (QID) | ORAL | 0 refills | Status: DC

## 2019-07-07 MED ORDER — VARICELLA VIRUS VACCINE LIVE 1350 PFU/0.5ML IJ SUSR
0.5000 mL | Freq: Once | INTRAMUSCULAR | Status: AC
  Administered 2019-07-07: 0.5 mL via SUBCUTANEOUS
  Filled 2019-07-07: qty 0.5

## 2019-07-07 MED ORDER — AMOXICILLIN 500 MG PO CAPS: 500.0000 mg | ORAL_CAPSULE | Freq: Three times a day (TID) | ORAL | 2 refills | Status: DC

## 2019-07-07 MED ORDER — VITAMIN D3 125 MCG (5000 UT) PO CAPS: 1.0000 | ORAL_CAPSULE | Freq: Every day | ORAL | 2 refills | Status: DC

## 2019-07-07 MED ORDER — CITRANATAL BLOOM 90-1 MG PO TABS: 1.0000 | ORAL_TABLET | Freq: Every day | ORAL | 11 refills | Status: DC

## 2019-07-07 NOTE — Consult Note (Signed)
Neurology:  33 yr old female post patrum with RLE foot drop

## 2019-07-07 NOTE — Discharge Summary (Signed)
Physician Obstetric Discharge Summary  Patient ID: Lisa Brady MRN: [id number] DOB/AGE: 06/02/1986 33 y.o.   Date of Admission: 07/04/2019  Date of Discharge:   Admitting Diagnosis: Induction of labor at [redacted]w[redacted]d  Secondary Diagnosis: none  Mode of Delivery: vacuum-assisted vaginal delivery     Discharge Diagnosis: VAVD, 2nd degree vaginal laceration, left foot drop, urinary incontenance   Intrapartum Procedures: epidural, laceration 2nd and pitocin augmentation   Post partum procedures: varicella vaccine, PT consult, Neurology consult, urinary incontenance, postpartum anemia  Complications: left foot drop, postpartum anemia, urinary incontenance(sent home with foley catheter)   Brief Hospital Course  Emelie Newsom is a [id number] who had a SVD on 07/05/2019;  for further details of this, please refer to the delivey note.  Patient had an uncomplicated postpartum course.  By time of discharge on PPD#2, her pain was controlled on oral pain medications; she had appropriate lochia and was ambulating with rolling walker, breast feeding well, & tolerating regular diet.  She was deemed stable for discharge to home with foley catheter after neurology consult (televisit).     Labs: CBC Latest Ref Rng & Units 07/06/2019 07/04/2019 03/23/2019  WBC 4.0 - 10.5 K/uL 15.2(H) 8.6 9.0  Hemoglobin 12.0 - 15.0 g/dL 9.6(L) 11.4(L) 12.0  Hematocrit 36.0 - 46.0 % 29.0(L) 34.6(L) 35.7  Platelets 150 - 400 K/uL 170 182 218   A POS  Physical exam:  Blood pressure 127/62, pulse 75, temperature 98.2 F (36.8 C), temperature source Oral, resp. rate 18, height 5\' 3"  (1.6 m), weight 75.3 kg, last menstrual period 09/22/2018, SpO2 99 %, unknown if currently breastfeeding. General: alert and no distress Lochia: appropriate Abdomen: soft, NT, bladder not distended. Uterine Fundus: firm and 2FB below umbilicus. Extremities: No evidence of DVT seen on physical exam. scant lower extremity edema  bilateral. Discussed findings with Dr Amalia Hailey and he agrees with POC. Discharge Instructions: Per After Visit Summary. Activity: Advance as tolerated. Pelvic rest for 6 weeks.  Also refer to After Visit Summary Diet: Regular Medications: citranatal Bloom camila (to start in 10 days) Colace Vit D3 Ibuprofen 600mg  Amoxil 500mg  tid for 7 days Outpatient follow up: in 3 days with Dr Marcelline Mates for foley removal and re-evaluation Postpartum contraception: oral progesterone-only contraceptive  Discharged Condition: stable  Discharged to: home after foley re-inserted. Has RW and bedside toilet for home use. Reports 6-7 steps to get in the home and then 1 flight of stairs to get to bedroom and bathroom. They plan for her to remain upstairs while at home and spouse is comfortable helping her ambulate with the walker.    Newborn Data: Disposition:home with mother,   Apgars: APGAR (1 MIN): 9   APGAR (5 MINS): 9   APGAR (10 MINS):    Baby Feeding: Breast  Mayra Brahm Rockney Ghee, CNM

## 2019-07-07 NOTE — Lactation Note (Signed)
This note was copied from a baby's chart. Lactation Consultation Note  Patient Name: Lisa Brady [id number] Date: 07/07/2019 Reason for consult: Follow-up assessment;Primapara Parents want to make sure baby pushing away is okay, I reassured them that this is normal behavior for newborn, baby in football hold on left breast, swallows audible, mom shown how to make sure lower jaw is relaxed and open well to get deeper latch and decrease latch pain, right nipple has larger abrasion on nipple, encouraged varying position for latch and watching for deep latch   Maternal Data Formula Feeding for Exclusion: No Has patient been taught Hand Expression?: Yes Does the patient have breastfeeding experience prior to this delivery?: No  Feeding Feeding Type: Breast Fed  LATCH Score Latch: Grasps breast easily, tongue down, lips flanged, rhythmical sucking.  Audible Swallowing: Spontaneous and intermittent  Type of Nipple: Everted at rest and after stimulation  Comfort (Breast/Nipple): Filling, red/small blisters or bruises, mild/mod discomfort  Hold (Positioning): Assistance needed to correctly position infant at breast and maintain latch.  LATCH Score: 8  Interventions Interventions: Assisted with latch;Skin to skin;Breast massage;Hand express;Support pillows;Coconut oil;Comfort gels  Lactation Tools Discussed/Used WIC Program: No   Consult Status Consult Status: PRN Date: 07/07/19 Follow-up type: In-patient    Ferol Luz 07/07/2019, 1:21 PM

## 2019-07-07 NOTE — Progress Notes (Addendum)
Pt discharged with infant with foley catheter intact. Discharge instructions, prescriptions, and follow up appointments given to and reviewed with patient. Pt verbalized understanding. Escorted out by staff.

## 2019-07-10 ENCOUNTER — Encounter: Payer: Self-pay | Admitting: Obstetrics and Gynecology

## 2019-07-10 ENCOUNTER — Other Ambulatory Visit: Payer: Self-pay

## 2019-07-10 ENCOUNTER — Ambulatory Visit (INDEPENDENT_AMBULATORY_CARE_PROVIDER_SITE_OTHER): Payer: No Typology Code available for payment source | Admitting: Obstetrics and Gynecology

## 2019-07-10 VITALS — BP 118/65 | HR 86 | Ht 63.0 in | Wt 147.0 lb

## 2019-07-10 DIAGNOSIS — N3945 Continuous leakage: Secondary | ICD-10-CM | POA: Diagnosis not present

## 2019-07-10 DIAGNOSIS — M21372 Foot drop, left foot: Secondary | ICD-10-CM

## 2019-07-10 DIAGNOSIS — S3730XA Unspecified injury of urethra, initial encounter: Secondary | ICD-10-CM | POA: Diagnosis not present

## 2019-07-10 DIAGNOSIS — M6289 Other specified disorders of muscle: Secondary | ICD-10-CM

## 2019-07-10 MED ORDER — CITRANATAL BLOOM 90-1 MG PO TABS: 1.0000 | ORAL_TABLET | Freq: Every day | ORAL | 11 refills | Status: DC

## 2019-07-10 NOTE — Progress Notes (Signed)
GYNECOLOGY PROGRESS NOTE  Subjective:    Patient ID: Lisa Brady, female    DOB: 1986/04/26, 33 y.o.   MRN: [id number]  HPI  Patient is a 33 y.o. G42P1001 female who presents for postpartum follow up.  She is ~ 5 days postpartum from a vacuum-assisted vaginal delivery with fetal macrosomia (9 lb 10 oz), with postpartum complication of left lower extremity foot drop and urinary incontinence with continuous leakage (due to urethral trauma secondary to expulsion of an inflated foley catheter during second stage of labor). She had an epidural in place during delivery. She sustained a second-degree laceration postpartum. Patient was seen by Neurology and Anesthesiology while inpatient to rule out other potential causes besides obstetrical nerve injury. She is currently mobilizing with the assistance of a walker. She has an indwelling catheter in place currently. She reports that she has been having some leakage around the catheter since discharge from the hospital.  She does however note that for the first time she has felt the sensation of bladder fullness on yesterday.   She reports that breastfeeding is going well. Infant is doing well. She denies any bowel disturbances. She does note passage of a large clot 2 days ago ~ size of a golf ball. Otherwise bleeding has been normal. She denies postpartum blues.    The following portions of the patient's history were reviewed and updated as appropriate: allergies, current medications, past family history, past medical history, past social history, past surgical history and problem list.  Review of Systems Pertinent items noted in HPI and remainder of comprehensive ROS otherwise negative.   Objective:   Blood pressure 118/65, pulse 86, height 5\' 3"  (1.6 m), weight 147 lb (66.7 kg), unknown if currently breastfeeding. General appearance: alert and no distress  Bladder: foley catheter in place.  Pelvic: Appropriate lochia rubra. Speculum exam not  performed.  Extremities: extremities normal, atraumatic, no cyanosis or edema.  Neurologic: weak ankle dorsiflexion on left, unable to bear full weight on left. Right sided extremities neurologically intact. Left upper extremity neurologically intact.  Assessment:   Pelvic floor dysfunction in female  Acquired left foot drop  Urinary incontinence with continuous leakage  Trauma of urethra, initial encounter  Postpartum state  Plan:   1. Pelvic floor dysfunction with acquired left foot drop (likely secondary to common peroneal nerve injury sustained during extended second stage of labor and 3 hrs of pushing).  Discussed that symptoms can resolve as soon as 2 weeks from now, or may extend all the way to 6 months.  Patient was seen by PT while inpatient, however currently does not have any follow up.  Will refer for physical therapy (for foot drop as well as for pelvic floor strengthening).  2. Urinary incontinence with continuous leakage likely secondary to recent urethral trauma. Will likely resolve with time. Currently with foley catheter in place. Notes she has begun to experience bladder sensations such as fullness beginning yesterday. Currently wearing Depends for urinary incontinence. Discussed that her bladder muscles are beginning to show evidence of sensation. Will remove foley catheter, and strongly encouraged Kegel exercises.  Discussed that leakage may continue to occur for the next week until her urthera constricts back to it's normal diameter. If no improvement over the next 1-2 weeks, can consider referral to Urology for urethral bulking.  After foley catheter removed patient waited ~ 1 hr to attempt to void.  Was able to void 80 cc of urine without difficulty. Encouraged timed voiding (q  2-3 hrs) while at home.  Patient to f/u in 1-2 weeks if no improvement in symptoms.     A total of 25 minutes were spent face-to-face with the patient during this encounter and over half of that  time involved counseling and coordination of care.   [name], MD [facility] 07/10/2019 8:59 PM

## 2019-07-10 NOTE — Progress Notes (Signed)
Pt present today for a follow up for foley cath. Pt stated that she is having some leaking from the foley cath, noticed that cath did not empty while she was sleeping but when she stood up out the bed the cath she noticed the cath was fulling up. Pt noticed that when she sits or lay down the foley cath is not draining. Pt stated passing a vaginal blood clot. Pt's urine measurements were 63mL.

## 2019-07-16 ENCOUNTER — Other Ambulatory Visit: Payer: Self-pay

## 2019-07-16 ENCOUNTER — Ambulatory Visit: Payer: No Typology Code available for payment source | Attending: Obstetrics and Gynecology

## 2019-07-16 ENCOUNTER — Telehealth: Payer: Self-pay | Admitting: Obstetrics and Gynecology

## 2019-07-16 DIAGNOSIS — M21372 Foot drop, left foot: Secondary | ICD-10-CM | POA: Diagnosis present

## 2019-07-16 DIAGNOSIS — M62838 Other muscle spasm: Secondary | ICD-10-CM | POA: Diagnosis present

## 2019-07-16 DIAGNOSIS — M533 Sacrococcygeal disorders, not elsewhere classified: Secondary | ICD-10-CM | POA: Diagnosis present

## 2019-07-16 NOTE — Therapy (Signed)
[place] MAIN [facility] SERVICES [address] [place], [place], [address] Phone: [phone]   Fax:  [phone]  Physical Therapy Evaluation  The patient has been informed of current processes in place at Outpatient Rehab to protect patients from Covid-19 exposure including social distancing, schedule modifications, and new cleaning procedures. After discussing their particular risk with a therapist based on the patient's personal risk factors, the patient has decided to proceed with in-person therapy.  Patient Details  Name: Lisa Brady MRN: [id number] Date of Birth: [birth year] Referring Provider (PT): [name]   Encounter Date: 07/16/2019  PT End of Session - 07/16/19 1549    Visit Number  1    Number of Visits  20    Date for PT Re-Evaluation  09/24/19    Authorization Type  [place]    Authorization - Visit Number  1    Authorization - Number of Visits  20    PT Start Time  5681    PT Stop Time  2751    PT Time Calculation (min)  70 min    Equipment Utilized During Treatment  Gait belt    Activity Tolerance  Patient tolerated treatment well;No increased pain    Behavior During Therapy  [facility] for tasks assessed/performed       History reviewed. No pertinent past medical history.  Past Surgical History:  Procedure Laterality Date  . WISDOM TOOTH EXTRACTION  2006    There were no vitals filed for this visit.    Pelvic Floor Physical Therapy Evaluation and Assessment  SCREENING  Falls in last 6 mo:   Red Flags:  Have you had any night sweats? no Unexplained weight loss? no Saddle anesthesia? no Unexplained changes in bowel or bladder habits? no  SUBJECTIVE  Patient reports: Pushed out the foley catheter with delivery. Has been unable to feel Kegel. Is timing her voiding ~ every hour to help with leakage. Having leakage when trying to pull down her pants still. Has L foot drop. Had a lot of pain/discomfort in L  glute/HS during delivery, it is a little sore today.   Baby born on July 30.   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 "w shape"  Gynecological History: none  Urinary History: Had occasional SUI both prior and during pregnancy. (drops)  Gastrointestinal History: Occasional constipation prior/during pregnancy  Sexual activity/pain: stretch/discomfort occasionally.greatest  with penetration.  Location of pain: L glute/knee  Current pain:  3/10  Max pain:  3/10 Least pain:  0/10 Nature of pain: tightness/achy  Patient Goals: Control her bladder/not need depends.   OBJECTIVE  Posture/Observations:  Sitting: posterior pelvic tilt Standing: L PSIS low L ASIS high, B genu valgus with medial patella   Palpation/Segmental Motion/Joint Play: Pain into R knee with pressure to R L5 multifidus. TTP to B OI, Glut min and R pectineus.  Special tests:   Scoliosis: possible slight R thoracic/ L Lumbar curve.  Supine-to-long-sit RLE long in both Leg-length: 83.5 B  Range of Motion/Flexibilty:  Spine: WNL for SB and ROT Hips:   Strength/MMT: Deferred to follow-up LE MMT  LE MMT Left Right  Hip flex:  (L2) /5 /5  Hip ext: /5 /5  Hip abd: /5 /5  Hip add: /5 /5  Hip IR /5 /5  Hip ER /5 /5     Abdominal:  Palpation: L iliacus>R, R Psoas "ticklish"  Diastasis: 1.5 finger diastasis.  Pelvic Floor External Exam: Deferred to following 6 week check-up/healing of vaginal tissue. Introitus Appears:  Skin integrity:  Palpation: Cough: Prolapse visible?: Scar mobility:  Internal Vaginal Exam: Strength (PERF):  Symmetry: Palpation: Prolapse:   Internal Rectal Exam: Strength (PERF): Symmetry: Palpation: Prolapse:   Gait Analysis: AMB with RW, L foot drop  Pelvic Floor Outcome Measures:  UDI:  67, UIQ: 72/100  INTERVENTIONS THIS SESSION: Self-care: Educated on the structure and function of the pelvic floor in relation to their symptoms as well as the POC, and initial HEP in order to set patient expectations and understanding from which we will build on in the future sessions. Therex: Educated on and practiced TA in mod-quad to improve recruitment and awareness of TA and secondarily the PFM.   Total time: 70 min.      [facility] PT Assessment - 07/16/19 0001      Assessment   Medical Diagnosis  UI and L foot drop    Referring Provider (PT)  [name]    Onset Date/Surgical Date  07/05/19    Prior Therapy  none      Precautions   Precautions  --   2 weeks postpartum     Restrictions   Weight Bearing Restrictions  No      Balance Screen   Has the patient fallen in the past 6 months  No      [place] residence    Living Arrangements  Spouse/significant other;Children    Type of [place] to enter    Entrance Stairs-Number of Steps  6    Entrance Stairs-Rails  Right    Home Layout  Two level    Alternate Level Stairs-Number of Steps  12    Alternate Level Stairs-Rails  Left    Home Equipment  Walker - 2 wheels      Prior Function   Level of Independence  Independent    Vocation  Other (comment)   Maternity Leave   Vocation Requirements  Medical Doctor      Cognition   Overall Cognitive Status  Within Functional Limits for tasks assessed                Objective measurements completed on examination: See above findings.                PT Short Term Goals - 07/16/19 1606      PT SHORT TERM GOAL #1   Title  Patient will demonstrate improved pelvic alignment and balance of musculature surrounding the pelvis to facilitate decreased PFM spasms and decrease pressure on nerve roots to allow for optimal PFM function    Baseline  Has L foot-drop and pain in L glutes/knee as well  as inability to sense PFM and L posterior innominate rotation/spasms surrounding pelvis.    Time  5    Period  Weeks    Status  New    Target Date  08/20/19      PT SHORT TERM GOAL #2   Title  Patient will demonstrate HEP x1 in the clinic to demonstrate understanding and proper form to allow for further improvement.    Baseline  Pt. lacks knowledge of therapeutic ecersise that will decrease Sx.    Time  5    Period  Weeks    Status  New  Target Date  08/20/19      PT SHORT TERM GOAL #3   Title  Pt. will be able to walk 250 Ft.with CGA and w/o assistive device to demonstrate improved balance/decreased instability and foot drop.    Baseline  Pt. using RW to AMB due to L foot-drop and increased instability.    Time  5    Period  Weeks    Status  New    Target Date  08/20/19      PT SHORT TERM GOAL #4   Title  Patient will demonstrate a coordinated contraction, relaxation, and bulge of the pelvic floor muscles to demonstrate functional recruitment and motion and allow for further strengthening.    Baseline  Pt. reports inability to sense the PFM for kegel or relaxation, having mixed UI    Time  5    Period  Weeks    Status  New    Target Date  08/20/19        PT Long Term Goals - 07/16/19 1616      PT LONG TERM GOAL #1   Title  Patient will report no episodes of UUI/SUI over the course of the prior two weeks to demonstrate improved functional ability.    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    Baseline  having mild pain with initial penetration    Time  10    Period  Weeks    Status  New    Target Date  09/24/19      PT LONG TERM GOAL #3   Title  Patient will score at or below 22/100 on the UDI and 27/100 on the UIQ to demonstrate a  clinically meaningful decrease in disability and distress due to pelvic floor dysfunction.    Baseline  UDI: 67, UIQ: 72/100    Time  10    Period  Weeks    Status  New    Target Date  09/24/19      PT LONG TERM GOAL #4   Title  Pt. 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    Baseline  Pt. requiring RW for AMB due to L foot-drop and imbalance.    Time  10    Period  Weeks    Status  New    Target Date  09/24/19             Plan - 07/16/19 1552    Clinical Impression Statement  Pt. is a 33 y/o female who presents today with cheif c/o mixed UI and L foot drop in the setting of recent postpartum state. Her relevant PHM includes being 10 days postpartum with a grade 2 tear and baseline hypermobility. Her clinical exam revealed a L posterior innominate rotation and mild R thoracic/ L lumbar scoliosis with possible RLE long leg-length TBD when pelvis is well aligned at follow-up. Pt. history suggests baseline PFM tightness, with a traumatic disruption to urethra/ sphincters as catheter was expelled during delivery. will assess further when healing is complete or ~ 6 weeks postpartum.  She will benefit from skilled pelvic health PT to address the noted defecits and to continue to assess for and address other potential causes of mixed UI and L foot drop.    Personal Factors and Comorbidities  Time since onset of injury/illness/exacerbation    Examination-Activity Limitations  Locomotion Level;Stand;Stairs;Lift;Squat;Toileting;Continence;Sit;Transfers;Caring for Others;Bend    Examination-Participation Restrictions  Interpersonal Relationship;Yard Work;Community Activity;Meal Prep;Laundry    Stability/Clinical Decision Making  Evolving/Moderate complexity    Clinical Decision Making  Moderate    PT Frequency  2x / week    PT Duration  Other (comment)   10 weeks   PT  Treatment/Interventions  ADLs/Self Care Home Management;Aquatic Therapy;Biofeedback;Electrical Stimulation;Gait training;Stair training;Balance training;Therapeutic exercise;Therapeutic activities;Functional mobility training;Neuromuscular re-education;Patient/family education;Manual techniques;Scar mobilization;Dry needling;Taping;Spinal Manipulations;Joint Manipulations    PT Next Visit Plan  correct L posterior rotation and re-measure leg-length. TP release vs. TPDN to B OI, hip-flexors, lumbar paraspinals, and R pectineus.    PT Home Exercise Plan  TA in mod-quad    Consulted and Agree with Plan of Care  Patient       Patient will benefit from skilled therapeutic intervention in order to improve the following deficits and impairments:  Abnormal gait, Decreased balance, Decreased endurance, Difficulty walking, Increased muscle spasms, Impaired tone, Improper body mechanics, Decreased activity tolerance, Decreased coordination, Decreased strength, Hypermobility, Postural dysfunction, Pain  Visit Diagnosis: 1. Foot drop, left   2. Other muscle spasm   3. Sacrococcygeal disorders, not elsewhere classified        Problem List Patient Active Problem List   Diagnosis Date Noted  . Encounter for elective induction of labor 07/04/2019  . Maternal varicella, non-immune 11/20/2018   Willa Rough DPT, ATC Willa Rough 07/16/2019, 4:31 PM  [place] MAIN [facility] SERVICES [address] [place], [place], [address] Phone: [phone]   Fax:  [phone]  Name: Jazzlin Clements MRN: [id number] Date of Birth: [birth year]

## 2019-07-16 NOTE — Telephone Encounter (Signed)
Called pt to schedule appointment next week for pp complications, Pt stated she will call me back she is currently at an appointment. Thank you.

## 2019-07-16 NOTE — Patient Instructions (Signed)
   Breathe in, let belly relax down toward the floor and then breathe out, pulling the lower belly in toward the backbone.   Repeat this _10x3__ times _1-3__ times per day     

## 2019-07-16 NOTE — Telephone Encounter (Signed)
-----   Message from [name], MD sent at 07/16/2019  1:41 PM EDT ----- Regarding: Follow up appointment  Please have patient to f/u with me next week to follow up her postpartum complications    Thanks, Dr. Marcelline Mates

## 2019-07-23 ENCOUNTER — Ambulatory Visit: Payer: No Typology Code available for payment source

## 2019-07-23 ENCOUNTER — Other Ambulatory Visit: Payer: Self-pay

## 2019-07-23 DIAGNOSIS — M533 Sacrococcygeal disorders, not elsewhere classified: Secondary | ICD-10-CM

## 2019-07-23 DIAGNOSIS — M21372 Foot drop, left foot: Secondary | ICD-10-CM

## 2019-07-23 DIAGNOSIS — M62838 Other muscle spasm: Secondary | ICD-10-CM

## 2019-07-23 NOTE — Therapy (Signed)
[place] MAIN [facility] SERVICES [address] [place], [place], [address] Phone: [phone]   Fax:  [phone]  Physical Therapy Treatment  The patient has been informed of current processes in place at Outpatient Rehab to protect patients from Covid-19 exposure including social distancing, schedule modifications, and new cleaning procedures. After discussing their particular risk with a therapist based on the patient's personal risk factors, the patient has decided to proceed with in-person therapy.   Patient Details  Name: Lisa Brady MRN: [id number] Date of Birth: Feb 17, 1986 Referring Provider (PT): [name]   Encounter Date: 07/23/2019  PT End of Session - 07/23/19 1440    Visit Number  2    Number of Visits  20    Date for PT Re-Evaluation  09/24/19    Authorization Type  [facility]    Authorization - Visit Number  2    Authorization - Number of Visits  20    PT Start Time  3212    PT Stop Time  1107    PT Time Calculation (min)  60 min    Equipment Utilized During Treatment  Other (comment)   SI belt   Activity Tolerance  Patient tolerated treatment well;No increased pain    Behavior During Therapy  [facility] for tasks assessed/performed       No past medical history on file.  Past Surgical History:  Procedure Laterality Date  . WISDOM TOOTH EXTRACTION  2006    There were no vitals filed for this visit.    Pelvic Floor Physical Therapy Treatment Note  SCREENING  Changes in medications, allergies, or medical history?: none   SUBJECTIVE  Patient reports: Feels like the sphincter has shrunk back down to a normal size. Is only wearing a pad now, not needing depends. Is still voiding on a schedule. Not having "gushing" anymore. Getting to the bathroom and sitting before she has urination.  Precautions:  2 weeks PP  Pain update:  Location of pain: L lower leg/lateral Current pain:  2/10  Max pain:  2/10 Least  pain:  0/10 Nature of pain: tight/weak/fatigue  Patient Goals: Control her bladder/not need depends.   OBJECTIVE  Changes in: Posture/Observations:  R PSIS high, R knee bent. R anterior rotation with L ilium high in supine following DN to L QL and lumbar multifidus.   Range of Motion/Flexibilty:  Pt. Reports tingling into LE with crossing ankles, indicating continued severe spasm at deep hip ER.  Strength/MMT:  LE MMT:L peroneals show trace activation with attempt at [facility].  Palpation: TTP to  to L QL and Lumbar paraspinals, Lumbar multifidus on L  Gait Analysis: L foot drop   INTERVENTIONS THIS SESSION: Manual: Performed TP release and STM to L QL and Lumbar paraspinals to decrease spasm and pain and allow for improved balance of musculature for improved function and decreased symptoms. Performed L up-slip correction and inferior to superior grade 3 mobs to R ilium to improve pelvic alignment and decrease pressure on lumbosacral complex. Dry-needle: Performed TPDN with .30x16mm needle as described below to decrease spasm and pain and allow for improved balance of musculature for improved function and decreased urinary symptoms and foot drop. Therex: Educated on and practiced L side-stretch To maintain and improve muscle length and allow for improved balance of musculature for long-term symptom relief and ankle pumps to encourage neural input to L leg to decrease foot drop.   Total time: 60 min.  Trigger Point Dry Needling - 07/23/19 0001    Consent Given?  Yes    Education Handout Provided  No    Muscles Treated Back/Hip  Erector spinae;Lumbar multifidi;Quadratus lumborum    Dry Needling Comments  Left    Erector spinae Response  Twitch response elicited;Palpable increased muscle length    Lumbar multifidi Response  Twitch response elicited;Palpable increased muscle length    Quadratus Lumborum Response  Twitch response elicited;Palpable  increased muscle length             PT Short Term Goals - 07/16/19 1606      PT SHORT TERM GOAL #1   Title  Patient will demonstrate improved pelvic alignment and balance of musculature surrounding the pelvis to facilitate decreased PFM spasms and decrease pressure on nerve roots to allow for optimal PFM function    Baseline  Has L foot-drop and pain in L glutes/knee as well as inability to sense PFM and L posterior innominate rotation/spasms surrounding pelvis.    Time  5    Period  Weeks    Status  New    Target Date  08/20/19      PT SHORT TERM GOAL #2   Title  Patient will demonstrate HEP x1 in the clinic to demonstrate understanding and proper form to allow for further improvement.    Baseline  Pt. lacks knowledge of therapeutic ecersise that will decrease Sx.    Time  5    Period  Weeks    Status  New    Target Date  08/20/19      PT SHORT TERM GOAL #3   Title  Pt. will be able to walk 250 Ft.with CGA and w/o assistive device to demonstrate improved balance/decreased instability and foot drop.    Baseline  Pt. using RW to AMB due to L foot-drop and increased instability.    Time  5    Period  Weeks    Status  New    Target Date  08/20/19      PT SHORT TERM GOAL #4   Title  Patient will demonstrate a coordinated contraction, relaxation, and bulge of the pelvic floor muscles to demonstrate functional recruitment and motion and allow for further strengthening.    Baseline  Pt. reports inability to sense the PFM for kegel or relaxation, having mixed UI    Time  5    Period  Weeks    Status  New    Target Date  08/20/19        PT Long Term Goals - 07/16/19 1616      PT LONG TERM GOAL #1   Title  Patient will report no episodes of UUI/SUI over the course of the prior two weeks to demonstrate improved functional ability.    Baseline  Having UUI at toiletside, h/o mild SUI before and during pregnancy    Time  10    Period  Weeks    Status  New    Target Date   09/24/19      PT LONG TERM GOAL #2   Title  Patient will report no pain with intercourse to demonstrate improved functional ability.    Baseline  having mild pain with initial penetration    Time  10    Period  Weeks    Status  New    Target Date  09/24/19      PT LONG TERM GOAL #3   Title  Patient will score at or  below 22/100 on the UDI and 27/100 on the UIQ to demonstrate a clinically meaningful decrease in disability and distress due to pelvic floor dysfunction.    Baseline  UDI: 67, UIQ: 72/100    Time  10    Period  Weeks    Status  New    Target Date  09/24/19      PT LONG TERM GOAL #4   Title  Pt. 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    Baseline  Pt. requiring RW for AMB due to L foot-drop and imbalance.    Time  10    Period  Weeks    Status  New    Target Date  09/24/19            Plan - 07/23/19 1444    Clinical Impression Statement  Pt. Responded well to all interventions today, demonstrating improved pelvic alignment and decreased spasm as well as understanding and correct performance of all education and exercises provided today. They will continue to benefit from skilled physical therapy to work toward remaining goals and maximize function as well as decrease likelihood of symptom increase or recurrence.     Personal Factors and Comorbidities  Time since onset of injury/illness/exacerbation    Examination-Activity Limitations  Locomotion Level;Stand;Stairs;Lift;Squat;Toileting;Continence;Sit;Transfers;Caring for Others;Bend    Examination-Participation Restrictions  Interpersonal Relationship;Yard Work;Community Activity;Meal Prep;Laundry    Stability/Clinical Decision Making  Evolving/Moderate complexity    PT Frequency  2x / week    PT Duration  Other (comment)   10 weeks   PT Treatment/Interventions  ADLs/Self Care Home Management;Aquatic  Therapy;Biofeedback;Electrical Stimulation;Gait training;Stair training;Balance training;Therapeutic exercise;Therapeutic activities;Functional mobility training;Neuromuscular re-education;Patient/family education;Manual techniques;Scar mobilization;Dry needling;Taping;Spinal Manipulations;Joint Manipulations    PT Next Visit Plan  re-measure leg-length. TP release vs. TPDN to B OI, hip-flexors, lumbar paraspinals, and R pectineus. re-assess for Piriformis spasm    PT Home Exercise Plan  TA in mod-quad, SI belt, Side-stretch, attempts at ankle pumps.    Consulted and Agree with Plan of Care  Patient       Patient will benefit from skilled therapeutic intervention in order to improve the following deficits and impairments:  Abnormal gait, Decreased balance, Decreased endurance, Difficulty walking, Increased muscle spasms, Impaired tone, Improper body mechanics, Decreased activity tolerance, Decreased coordination, Decreased strength, Hypermobility, Postural dysfunction, Pain  Visit Diagnosis: 1. Foot drop, left   2. Other muscle spasm   3. Sacrococcygeal disorders, not elsewhere classified        Problem List Patient Active Problem List   Diagnosis Date Noted  . Encounter for elective induction of labor 07/04/2019  . Maternal varicella, non-immune 11/20/2018   Willa Rough DPT, ATC Willa Rough 07/23/2019, 2:56 PM  [place] MAIN [facility] SERVICES [address] [place], [place], [address] Phone: [phone]   Fax:  [phone]  Name: Curtistine Pettitt MRN: [id number] Date of Birth: [birth year]

## 2019-07-23 NOTE — Patient Instructions (Signed)
   Hold for 30 seconds (5 deep breaths) and repeat 2-3 times on each side once a day  ANKLE: Pumps    Point toes down, then up. __10x3_ reps per set, _3__ sets per day, _7__ days per week

## 2019-07-27 ENCOUNTER — Encounter: Payer: Self-pay | Admitting: Obstetrics and Gynecology

## 2019-07-27 ENCOUNTER — Other Ambulatory Visit: Payer: Self-pay

## 2019-07-27 ENCOUNTER — Ambulatory Visit (INDEPENDENT_AMBULATORY_CARE_PROVIDER_SITE_OTHER): Payer: No Typology Code available for payment source | Admitting: Obstetrics and Gynecology

## 2019-07-27 VITALS — BP 125/79 | HR 98 | Ht 63.0 in | Wt 132.9 lb

## 2019-07-27 DIAGNOSIS — M21372 Foot drop, left foot: Secondary | ICD-10-CM | POA: Diagnosis not present

## 2019-07-27 DIAGNOSIS — R32 Unspecified urinary incontinence: Secondary | ICD-10-CM | POA: Diagnosis not present

## 2019-07-27 DIAGNOSIS — O9122 Nonpurulent mastitis associated with the puerperium: Secondary | ICD-10-CM

## 2019-07-27 DIAGNOSIS — Z0289 Encounter for other administrative examinations: Secondary | ICD-10-CM

## 2019-07-27 DIAGNOSIS — M6289 Other specified disorders of muscle: Secondary | ICD-10-CM

## 2019-07-27 LAB — POCT URINALYSIS DIPSTICK
Bilirubin, UA: NEGATIVE
Glucose, UA: NEGATIVE
Ketones, UA: NEGATIVE
Nitrite, UA: NEGATIVE
Protein, UA: NEGATIVE
Spec Grav, UA: <=1.005 — AB (ref 1.010–1.025)
Urobilinogen, UA: 0.2 E.U./dL
pH, UA: 6 (ref 5.0–8.0)

## 2019-07-27 MED ORDER — DICLOXACILLIN SODIUM 500 MG PO CAPS: 500.0000 mg | ORAL_CAPSULE | Freq: Four times a day (QID) | ORAL | 0 refills | Status: AC

## 2019-07-27 NOTE — Progress Notes (Signed)
Pt is present today for post partum complications. Pt has a foot drop, urining issues and pain in her left breast. Pt is currently breastfeeding.

## 2019-07-27 NOTE — Progress Notes (Signed)
    GYNECOLOGY PROGRESS NOTE  Subjective:    Patient ID: Lisa Brady, female    DOB: [birth year], 33 y.o.   MRN: [id number]  HPI  Patient is a 33 y.o. G22P1001 female who presents for postpartum follow up.  She is 3 weeks postpartum from a vacuum-assisted vaginal delivery with fetal macrosomia (9 lb 10 oz), with postpartum complication of left lower extremity foot drop and urinary incontinence with continuous leakage (due to urethral trauma secondary to expulsion of an inflated foley catheter during second stage of labor). She had an epidural in place during delivery. She sustained a second-degree laceration postpartum.  She is currently mobilizing with the assistance of a walker.  Patient reports that she is doing better, currently seeing physical therapy.  Has been able to take longer walks with her walker and short walks without her walker (however does note muscle fatigue and aching after).  She also is able to hold her urine longer, now voiding ~ every 3 hours, with rare urinary leakage occurring. Still having difficulty identifying her pelvic floor muscles however notes she thinks she is getting better when she urinates as she can slow down the stream at times (although can't completely stop it).    She also notes some redness of the left breast beginning yesterday, and bilateral nipple pain (although left>right). Has an appointment with lactation consultant yesterday. Breast feeding/pumping overall going well.   The following portions of the patient's history were reviewed and updated as appropriate: allergies, current medications, past family history, past medical history, past social history, past surgical history and problem list.   Review of Systems Pertinent items noted in HPI and remainder of comprehensive ROS otherwise negative.   Objective:   Blood pressure 125/79, pulse 98, height 5\' 3"  (1.6 m), weight 132 lb 14.4 oz (60.3 kg), currently breastfeeding. General appearance:  alert and no distress  Breasts: right breast normal without mass, skin or nipple changes or axillary nodes.  Left breast with faint erythema and streaking on inner upper quadrant.  Engorgement noted bilaterally.  Bladder: non-tender Pelvic: deferred  Extremities: extremities normal, atraumatic, no cyanosis or edema.  Neurologic: weak ankle dorsiflexion on left, unable to bear full weight on left (however improved since last visit). Right sided extremities neurologically intact. Left upper extremity neurologically intact.  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 Pelvic floor dysfunction with acquired left foot drop (likely secondary to common peroneal nerve injury sustained during extended second stage of labor and 3 hrs of pushing).  Overall doing well. Continue with PT.  Reiterated that it can take up to 6 months for resolution.  2. Urinary incontinence with continuous leakage likely secondary to recent urethral trauma, improving.  Able to better hold urine, and can decrease frequency of timed voiding. Will likely resolve with time. Continue encouraged Kegel exercises. Will undergo pelvic floor physical therapy after 6 weeks.  2. Postpartum mastitis - will treat with Dicloxacillin.  Also to f/u with lactation consultant later today.   RTC in 4-6 weeks with MD.  To f/u in 2 weeks with Philip Aspen, CNM for postpartum visit in 2-3 weeks.    [name], MD [facility] 07/27/2019 9:25 AM

## 2019-07-31 ENCOUNTER — Ambulatory Visit: Payer: No Typology Code available for payment source

## 2019-07-31 ENCOUNTER — Other Ambulatory Visit: Payer: Self-pay

## 2019-07-31 DIAGNOSIS — M21372 Foot drop, left foot: Secondary | ICD-10-CM

## 2019-07-31 DIAGNOSIS — M533 Sacrococcygeal disorders, not elsewhere classified: Secondary | ICD-10-CM

## 2019-07-31 DIAGNOSIS — M62838 Other muscle spasm: Secondary | ICD-10-CM

## 2019-07-31 NOTE — Therapy (Signed)
[place] MAIN [facility] SERVICES [address] [place], [place], [address] Phone: [phone]   Fax:  [phone]  Physical Therapy Treatment   The patient has been informed of current processes in place at Outpatient Rehab to protect patients from Covid-19 exposure including social distancing, schedule modifications, and new cleaning procedures. After discussing their particular risk with a therapist based on the patient's personal risk factors, the patient has decided to proceed with in-person therapy.   Patient Details  Name: Lisa Brady MRN: [id number] Date of Birth: July 01, 1986 Referring Provider (PT): [name]   Encounter Date: 07/31/2019  PT End of Session - 07/31/19 1302    Visit Number  3    Number of Visits  20    Date for PT Re-Evaluation  09/24/19    Authorization Type  [facility]    Authorization - Visit Number  3    Authorization - Number of Visits  20    PT Start Time  1100    PT Stop Time  1200    PT Time Calculation (min)  60 min    Equipment Utilized During Treatment  Other (comment)   SI belt   Activity Tolerance  Patient tolerated treatment well;No increased pain    Behavior During Therapy  WFL for tasks assessed/performed       Past Medical History:  Diagnosis Date  . Foot drop     Past Surgical History:  Procedure Laterality Date  . WISDOM TOOTH EXTRACTION  2006    There were no vitals filed for this visit.     Pelvic Floor Physical Therapy Treatment Note  SCREENING  Changes in medications, allergies, or medical history?: none   SUBJECTIVE  Patient reports: Having some UI still but does not necessarily have an urge. Urine flow starts without warning but with trigger (water running etc.). Had leakage today trying to get into the stall, when trying to wash her nipple shields, etc. Is wetting the pad. A little more stable since last visit, not much change in L Leg strength. Getting fatigued  with longer distance walking (~ 10 min.) not within the house.   Precautions:  3 weeks PP  Pain update:  Location of pain: L lower leg/lateral Current pain:  2/10  Max pain:  2/10 Least pain:  0/10 Nature of pain: tight/weak/fatigue  Patient Goals: Control her bladder/not need depends.   OBJECTIVE  Changes in: Posture/Observations:  ASIS and PSIS even.  Range of Motion/Flexibilty:  Decreased hip EXT ROM  Abdominal: Able to activate deep core with cueing for mini-march. Improved stability with practice.  Palpation: TTP to L Psoas/iliacus, lumbar multifidus, Piriformis, Glute min. And glute Med.  Gait Analysis: L foot drop   INTERVENTIONS THIS SESSION: Manual: Performed TP release and STM to L lumbar multifidus, Piriformis, Glute min. And glute Med. to decrease spasm and pain and allow for improved balance of musculature for improved function and decreased symptoms.  Dry-needle: Performed TPDN with .30x4mm needle as described below to decrease spasm and pain and allow for improved balance of musculature for improved function and decreased urinary symptoms and foot drop. NM re-ed: Educated on and practiced mini marches in hook-lying to improve recruitment of deep-core musculature and stabilize the L-S junction to help prevent return of spasm along sacral nerve. Given a TENS unit to begin increasing neural input to sciatic nerve to improve tibial nerve function.  Total time: 60 min.  PT Short Term Goals - 07/16/19 1606      PT SHORT TERM GOAL #1   Title  Patient will demonstrate improved pelvic alignment and balance of musculature surrounding the pelvis to facilitate decreased PFM spasms and decrease pressure on nerve roots to allow for optimal PFM function    Baseline  Has L foot-drop and pain in L glutes/knee as well as inability to sense PFM and L posterior innominate rotation/spasms surrounding pelvis.    Time  5     Period  Weeks    Status  New    Target Date  08/20/19      PT SHORT TERM GOAL #2   Title  Patient will demonstrate HEP x1 in the clinic to demonstrate understanding and proper form to allow for further improvement.    Baseline  Pt. lacks knowledge of therapeutic ecersise that will decrease Sx.    Time  5    Period  Weeks    Status  New    Target Date  08/20/19      PT SHORT TERM GOAL #3   Title  Pt. will be able to walk 250 Ft.with CGA and w/o assistive device to demonstrate improved balance/decreased instability and foot drop.    Baseline  Pt. using RW to AMB due to L foot-drop and increased instability.    Time  5    Period  Weeks    Status  New    Target Date  08/20/19      PT SHORT TERM GOAL #4   Title  Patient will demonstrate a coordinated contraction, relaxation, and bulge of the pelvic floor muscles to demonstrate functional recruitment and motion and allow for further strengthening.    Baseline  Pt. reports inability to sense the PFM for kegel or relaxation, having mixed UI    Time  5    Period  Weeks    Status  New    Target Date  08/20/19        PT Long Term Goals - 07/16/19 1616      PT LONG TERM GOAL #1   Title  Patient will report no episodes of UUI/SUI over the course of the prior two weeks to demonstrate improved functional ability.    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    Baseline  having mild pain with initial penetration    Time  10    Period  Weeks    Status  New    Target Date  09/24/19      PT LONG TERM GOAL #3   Title  Patient will score at or below 22/100 on the UDI and 27/100 on the UIQ to demonstrate a clinically meaningful decrease in disability and distress due to pelvic floor dysfunction.    Baseline   UDI: 67, UIQ: 72/100    Time  10    Period  Weeks    Status  New    Target Date  09/24/19      PT LONG TERM GOAL #4   Title  Pt. will demonstrate appropriate gait mechanics and be able to perform ADL's without use of AD and without pain  to allow for child-care and return to work.    Baseline  Pt. requiring RW for AMB due to L foot-drop and imbalance.    Time  10    Period  Weeks    Status  New    Target Date  09/24/19            Plan - 07/31/19 1303    Clinical Impression Statement  Pt. Responded well to all interventions today, demonstrating improved deep-core recruitment and decreased spasms through lumbar multifidus and hip EXT/ER's as well as understanding and correct performance of all education and exercises provided today. They will continue to benefit from skilled physical therapy to work toward remaining goals and maximize function as well as decrease likelihood of symptom increase or recurrence.     Personal Factors and Comorbidities  Time since onset of injury/illness/exacerbation    Examination-Activity Limitations  Locomotion Level;Stand;Stairs;Lift;Squat;Toileting;Continence;Sit;Transfers;Caring for Others;Bend    Examination-Participation Restrictions  Interpersonal Relationship;Yard Work;Community Activity;Meal Prep;Laundry    Stability/Clinical Decision Making  Evolving/Moderate complexity    PT Frequency  2x / week    PT Duration  Other (comment)   10 weeks   PT Treatment/Interventions  ADLs/Self Care Home Management;Aquatic Therapy;Biofeedback;Electrical Stimulation;Gait training;Stair training;Balance training;Therapeutic exercise;Therapeutic activities;Functional mobility training;Neuromuscular re-education;Patient/family education;Manual techniques;Scar mobilization;Dry needling;Taping;Spinal Manipulations;Joint Manipulations    PT Next Visit Plan  re-measure leg-length. TP release vs. DN to, hip-flexors, and R pectineus. re-assess for Piriformis spasm following  DN at last visit    PT Home Exercise Plan  TA in mod-quad, SI belt, Side-stretch, attempts at ankle pumps, hip-flexor stretch and TP release, tens unit for 30 min 2x/day at sensory level.    Consulted and Agree with Plan of Care  Patient       Patient will benefit from skilled therapeutic intervention in order to improve the following deficits and impairments:  Abnormal gait, Decreased balance, Decreased endurance, Difficulty walking, Increased muscle spasms, Impaired tone, Improper body mechanics, Decreased activity tolerance, Decreased coordination, Decreased strength, Hypermobility, Postural dysfunction, Pain  Visit Diagnosis: Foot drop, left  Other muscle spasm  Sacrococcygeal disorders, not elsewhere classified     Problem List Patient Active Problem List   Diagnosis Date Noted  . Encounter for elective induction of labor 07/04/2019  . Maternal varicella, non-immune 11/20/2018   Willa Rough DPT, ATC Willa Rough 07/31/2019, 2:05 PM  [place] MAIN [facility] SERVICES [address] [place], [place], [address] Phone: [phone]   Fax:  [phone]  Name: Lisa Brady MRN: [id number] Date of Birth: Jan 31, 1986

## 2019-07-31 NOTE — Patient Instructions (Signed)
Mini-Marches    Exhale, drawing the lower tummy (TA) in toward the back bone and hold contraction while you lift one foot ~ 2 inches off the mat, then the other foot before relaxing and resetting. Try to keep your hips from rocking, using your hands to sense whether they are staying even as pictured.      Perform _15x2__ repetitions for _3__ sets. Do this _1-3_ times per day.  These are your deep hip-flexor muscles. They are easiest to reach where they come together at the hip.   To perform release on this muscle, start by getting into this position by laying on your stomach then put the ball under you where you feel the tender spot and bring the opposite knee up/out to the side to add pressure as needed. Hold still and take deep breaths until the pain is at least 50% less or, ideally ,just pressure.   Flexors, Lunge  Hip Flexor Stretch: Proposal Pose    Maintain pelvic tuck under, lift pubic bone toward navel. Engage posterior hip muscles (firm glute muscles of leg in back position) and shift forward until you feel stretch on front of leg that is down. To increase stretch, maintain balance and ease hips forward. You may use one hand on a chair for balance if needed. Hold for __5__ breaths. Repeat __2-3__ times each leg.  Do _1-2__ times per day.

## 2019-08-03 ENCOUNTER — Other Ambulatory Visit: Payer: Self-pay

## 2019-08-03 NOTE — Telephone Encounter (Signed)
Pt called to inform her that the pharmacy had sent a PA for her prenatal vitamins. Pt was advised that [facility] has prenatal vitamins for 4.00. Pt stated that the reason [facility] prescribed the prenatal due to the iron in them. Pt stated that she went to [name] and got iron pills and still taking her prenatal vit.

## 2019-08-07 ENCOUNTER — Ambulatory Visit: Payer: No Typology Code available for payment source | Attending: Obstetrics and Gynecology

## 2019-08-07 ENCOUNTER — Other Ambulatory Visit: Payer: Self-pay

## 2019-08-07 DIAGNOSIS — M62838 Other muscle spasm: Secondary | ICD-10-CM | POA: Insufficient documentation

## 2019-08-07 DIAGNOSIS — M533 Sacrococcygeal disorders, not elsewhere classified: Secondary | ICD-10-CM | POA: Insufficient documentation

## 2019-08-07 DIAGNOSIS — M21372 Foot drop, left foot: Secondary | ICD-10-CM | POA: Insufficient documentation

## 2019-08-07 NOTE — Patient Instructions (Signed)
    This is The QL muscle  To perform release on this muscle, start by getting into this position by bridging the hips up and then slowly lowering your back, then your butt down to lengthen the low back then put the ball under you where you feel the tender spot and roll to the same side slightly to add pressure as needed. Hold still and take deep breaths until the pain is at least 50% less or, ideally, just pressure.    10 min of NMES at level where contraction is present and discomfort is tolerable. Move pad if pain present.

## 2019-08-07 NOTE — Therapy (Signed)
[place] MAIN [facility] SERVICES [address] [place], [place], [address] Phone: [phone]   Fax:  [phone]  Physical Therapy Treatment  The patient has been informed of current processes in place at Outpatient Rehab to protect patients from Covid-19 exposure including social distancing, schedule modifications, and new cleaning procedures. After discussing their particular risk with a therapist based on the patient's personal risk factors, the patient has decided to proceed with in-person therapy.   Patient Details  Name: Lisa Brady MRN: [id number] Date of Birth: [birth year] Referring Provider (PT): [name]   Encounter Date: 08/07/2019  PT End of Session - 08/08/19 1531    Visit Number  4    Number of Visits  20    Date for PT Re-Evaluation  09/24/19    Authorization Type  [place]    Authorization - Visit Number  4    Authorization - Number of Visits  20    PT Start Time  1000    PT Stop Time  1100    PT Time Calculation (min)  60 min    Equipment Utilized During Treatment  --   SI belt   Activity Tolerance  Patient tolerated treatment well;No increased pain    Behavior During Therapy  WFL for tasks assessed/performed       Past Medical History:  Diagnosis Date  . Foot drop     Past Surgical History:  Procedure Laterality Date  . WISDOM TOOTH EXTRACTION  2006    There were no vitals filed for this visit.    Pelvic Floor Physical Therapy Treatment Note  SCREENING  Changes in medications, allergies, or medical history?: none   SUBJECTIVE  Patient reports: Has been walking around the house without the walker some. Has not noticed much change from the TENS unit yet but is fatiguing less. Had "zaps" in the vaginal area. They went away. Able to recruit PFM to stop flow of urine. Cannot do kegels in standing. Has been trying the deep breaths when noticing urge, this helps most of the time. Has ~ 50% improvement  with UUI during washing nipple shield. 2 moderate leakage episodes over past week.   Precautions:  4 weeks PP  Pain update:  Location of pain: vaginal/pelvis Current pain:  0/10  Max pain:  /10 Least pain:  0/10 Nature of pain: "zaps"  Patient Goals: Control her bladder/not need depends.   OBJECTIVE  Changes in: Posture/Observations:  ASIS and PSIS even.  Range of Motion/Flexibilty:  Decreased hip EXT ROM  Abdominal: Able to activate deep core with cueing for mini-march. Improved stability with practice.  Palpation: TTP to L Psoas/iliacus, lumbar multifidus, Piriformis, Glute min. And glute Med.  Gait Analysis: L foot drop   INTERVENTIONS THIS SESSION: Manual: Performed TP release to L Iliacus and Psoas to decrease spasm and pain and allow for improved balance of musculature for improved function and decreased symptoms. Therex: reviewed hip-flexor stretch in kneeling and mini-marches in hook-lying to ensure efficacy of HEP. Discussed performing kegels more regularly in supine and sitting, waiting on standing until able to feel muscles better. Attended E-stim: Used NMES on L tib. Anterior to facilitate dorsiflexion and improve neural activity along the peroneal nerve to allow for decreased foot-drop and improved gait.  Total time: 60 min.                               PT  Short Term Goals - 07/16/19 1606      PT SHORT TERM GOAL #1   Title  Patient will demonstrate improved pelvic alignment and balance of musculature surrounding the pelvis to facilitate decreased PFM spasms and decrease pressure on nerve roots to allow for optimal PFM function    Baseline  Has L foot-drop and pain in L glutes/knee as well as inability to sense PFM and L posterior innominate rotation/spasms surrounding pelvis.    Time  5    Period  Weeks    Status  New    Target Date  08/20/19      PT SHORT TERM GOAL #2   Title  Patient will demonstrate HEP x1 in the  clinic to demonstrate understanding and proper form to allow for further improvement.    Baseline  Pt. lacks knowledge of therapeutic ecersise that will decrease Sx.    Time  5    Period  Weeks    Status  New    Target Date  08/20/19      PT SHORT TERM GOAL #3   Title  Pt. will be able to walk 250 Ft.with CGA and w/o assistive device to demonstrate improved balance/decreased instability and foot drop.    Baseline  Pt. using RW to AMB due to L foot-drop and increased instability.    Time  5    Period  Weeks    Status  New    Target Date  08/20/19      PT SHORT TERM GOAL #4   Title  Patient will demonstrate a coordinated contraction, relaxation, and bulge of the pelvic floor muscles to demonstrate functional recruitment and motion and allow for further strengthening.    Baseline  Pt. reports inability to sense the PFM for kegel or relaxation, having mixed UI    Time  5    Period  Weeks    Status  New    Target Date  08/20/19        PT Long Term Goals - 07/16/19 1616      PT LONG TERM GOAL #1   Title  Patient will report no episodes of UUI/SUI over the course of the prior two weeks to demonstrate improved functional ability.    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    Baseline  having mild pain with initial penetration    Time  10    Period  Weeks    Status  New    Target Date  09/24/19      PT LONG TERM GOAL #3   Title  Patient will score at or below 22/100 on the UDI and 27/100 on the UIQ to demonstrate a clinically meaningful decrease in disability and distress due to pelvic floor dysfunction.    Baseline  UDI: 67, UIQ: 72/100    Time  10    Period  Weeks    Status  New    Target Date  09/24/19      PT LONG TERM GOAL #4   Title  Pt.  will demonstrate appropriate gait mechanics and be able to perform ADL's without use of AD and without pain to  allow for child-care and return to work.    Baseline  Pt. requiring RW for AMB due to L foot-drop and imbalance.    Time  10    Period  Weeks    Status  New    Target Date  09/24/19            Plan - 08/08/19 1532    Clinical Impression Statement  Pt. Responded well to all interventions today, demonstrating decreased spasm and some slight DF/eversion with use on NMES as well as understanding and correct performance of all education and exercises provided today. They will continue to benefit from skilled physical therapy to work toward remaining goals and maximize function as well as decrease likelihood of symptom increase or recurrence.     Personal Factors and Comorbidities  Time since onset of injury/illness/exacerbation    Examination-Activity Limitations  Locomotion Level;Stand;Stairs;Lift;Squat;Toileting;Continence;Sit;Transfers;Caring for Others;Bend    Examination-Participation Restrictions  Interpersonal Relationship;Yard Work;Community Activity;Meal Prep;Laundry    Stability/Clinical Decision Making  Evolving/Moderate complexity    PT Frequency  2x / week    PT Duration  Other (comment)   10 weeks   PT Treatment/Interventions  ADLs/Self Care Home Management;Aquatic Therapy;Biofeedback;Electrical Stimulation;Gait training;Stair training;Balance training;Therapeutic exercise;Therapeutic activities;Functional mobility training;Neuromuscular re-education;Patient/family education;Manual techniques;Scar mobilization;Dry needling;Taping;Spinal Manipulations;Joint Manipulations    PT Next Visit Plan  re-measure leg-length. TP release vs. DN to, hip-flexors, and R pectineus. re-assess for Piriformis spasm following DN at prior visit    PT Home Exercise Plan  TA in mod-quad, SI belt, Side-stretch, attempts at ankle pumps, hip-flexor stretch and TP release, NMES 10 min. 2x/day at  contraction level on Ant. Tib.    Consulted and Agree with Plan of Care  Patient       Patient will benefit from skilled therapeutic intervention in order to improve the following deficits and impairments:  Abnormal gait, Decreased balance, Decreased endurance, Difficulty walking, Increased muscle spasms, Impaired tone, Improper body mechanics, Decreased activity tolerance, Decreased coordination, Decreased strength, Hypermobility, Postural dysfunction, Pain  Visit Diagnosis: Foot drop, left  Other muscle spasm  Sacrococcygeal disorders, not elsewhere classified     Problem List Patient Active Problem List   Diagnosis Date Noted  . Encounter for elective induction of labor 07/04/2019  . Maternal varicella, non-immune 11/20/2018   Willa Rough DPT, ATC Willa Rough 08/08/2019, 3:35 PM  [place] MAIN [facility] SERVICES [address] [place], [place], [address] Phone: [phone]   Fax:  [phone]  Name: Sharona Manak MRN: [id number] Date of Birth: 01-May-1986

## 2019-08-14 ENCOUNTER — Other Ambulatory Visit: Payer: Self-pay

## 2019-08-14 ENCOUNTER — Ambulatory Visit: Payer: No Typology Code available for payment source

## 2019-08-14 DIAGNOSIS — M533 Sacrococcygeal disorders, not elsewhere classified: Secondary | ICD-10-CM

## 2019-08-14 DIAGNOSIS — M62838 Other muscle spasm: Secondary | ICD-10-CM

## 2019-08-14 DIAGNOSIS — M21372 Foot drop, left foot: Secondary | ICD-10-CM

## 2019-08-14 NOTE — Therapy (Signed)
[place] MAIN [facility] SERVICES [address] [place], [place], [address] Phone: [phone]   Fax:  [phone]  Physical Therapy Treatment  The patient has been informed of current processes in place at Outpatient Rehab to protect patients from Covid-19 exposure including social distancing, schedule modifications, and new cleaning procedures. After discussing their particular risk with a therapist based on the patient's personal risk factors, the patient has decided to proceed with in-person therapy.   Patient Details  Name: Lisa Brady MRN: [id number] Date of Birth: [birth year] Referring Provider (PT): [name]   Encounter Date: 08/14/2019  PT End of Session - 08/14/19 1216    Visit Number  5    Number of Visits  20    Date for PT Re-Evaluation  09/24/19    Authorization Type  [place]    Authorization - Visit Number  5    Authorization - Number of Visits  20    PT Start Time  1000    PT Stop Time  1100    PT Time Calculation (min)  60 min    Equipment Utilized During Treatment  --   SI belt   Activity Tolerance  Patient tolerated treatment well;No increased pain    Behavior During Therapy  WFL for tasks assessed/performed       Past Medical History:  Diagnosis Date  . Foot drop     Past Surgical History:  Procedure Laterality Date  . WISDOM TOOTH EXTRACTION  2006    There were no vitals filed for this visit.    Pelvic Floor Physical Therapy Treatment Note  SCREENING  Changes in medications, allergies, or medical history?: none   SUBJECTIVE  Patient reports: Had one complete incontinence episode, has been able to use the urge suppression successfully as well. Is getting triggers in the kitchen. Doing kegels more ,not sure if it is making a difference. Feels like the foot is getting better, seing more eversion than true DF. Walked ~ 2-300 yds. Using NMES 3x/day.   Precautions:  5 weeks PP  Pain  update:  Location of pain: LLB/side Current pain:  0/10  Max pain:  2/10 (discomfort/ache) Least pain:  0/10 Nature of pain:   Patient Goals: Control her bladder/not need depends.   OBJECTIVE  Changes in: Posture/Observations:  Atrophy noted in superficial posterior compartment of LLE  Special tests:  SLR test: negative  Strength/MMT:  LE MMT  LE MMT Left Right  Hip flex:  (L2) 4+/5 4++/5  Hip ext: 4+/5 5/5  Hip abd: 4/5 4+/5  Hip add: 4+/5 4+/5  Hip IR 4/5 4+/5  Hip ER 4/5 4+/5  ` R: DF: 4++/5, all others 5/5 L: Knee EXT: 5/5, Knee Flex: 4+/5, DF: 2+/5 Inversion:2+/5, Eversion 3/5 PF: 5/5   Palpation: TTP to L Psoas/iliacus, Vastus intermedius, QL.  Gait Analysis: L foot drop, hip-flexion compensation with short distance. Able to AMB w/o AD for short distances.   INTERVENTIONS THIS SESSION: Manual: Assessed MMT  Of BLE to narrow down potential site of nerve injury for more targeted intervention. Therex: Educated Pt. On performing active DF/Eversion and PF/Inversion at the same time as she is using her NMES at home, contracting and relaxing according to the active and rest periods. Educated on how to perform standing quad stretch to improve ROM and allow  For deep hip-flexor stretch effectiveness. Theract: Educated on and applied kinesiotape to the lower leg to facilitate improved proprioception and recruitment while also providing assistance  against gravity to allow for improved gait pattern and decreased spasm in compensatory muscles to aid in decreasing UUI. Assessed gait with and without kinesiotape.   Total time: 60 min.                            PT Short Term Goals - 07/16/19 1606      PT SHORT TERM GOAL #1   Title  Patient will demonstrate improved pelvic alignment and balance of musculature surrounding the pelvis to facilitate decreased PFM spasms and decrease pressure on nerve roots to allow for optimal PFM function    Baseline   Has L foot-drop and pain in L glutes/knee as well as inability to sense PFM and L posterior innominate rotation/spasms surrounding pelvis.    Time  5    Period  Weeks    Status  New    Target Date  08/20/19      PT SHORT TERM GOAL #2   Title  Patient will demonstrate HEP x1 in the clinic to demonstrate understanding and proper form to allow for further improvement.    Baseline  Pt. lacks knowledge of therapeutic ecersise that will decrease Sx.    Time  5    Period  Weeks    Status  New    Target Date  08/20/19      PT SHORT TERM GOAL #3   Title  Pt. will be able to walk 250 Ft.with CGA and w/o assistive device to demonstrate improved balance/decreased instability and foot drop.    Baseline  Pt. using RW to AMB due to L foot-drop and increased instability.    Time  5    Period  Weeks    Status  New    Target Date  08/20/19      PT SHORT TERM GOAL #4   Title  Patient will demonstrate a coordinated contraction, relaxation, and bulge of the pelvic floor muscles to demonstrate functional recruitment and motion and allow for further strengthening.    Baseline  Pt. reports inability to sense the PFM for kegel or relaxation, having mixed UI    Time  5    Period  Weeks    Status  New    Target Date  08/20/19        PT Long Term Goals - 07/16/19 1616      PT LONG TERM GOAL #1   Title  Patient will report no episodes of UUI/SUI over the course of the prior two weeks to demonstrate improved functional ability.    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    Baseline  having mild pain with initial penetration    Time  10    Period  Weeks    Status  New    Target Date  09/24/19      PT LONG TERM GOAL #3   Title  Patient will score at or below 22/100 on  the UDI and 27/100 on the UIQ to demonstrate a clinically meaningful decrease in disability and distress due to pelvic floor dysfunction.    Baseline  UDI: 67, UIQ: 72/100  Time  10    Period  Weeks    Status  New    Target Date  09/24/19      PT LONG TERM GOAL #4   Title  Pt. 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    Baseline  Pt. requiring RW for AMB due to L foot-drop and imbalance.    Time  10    Period  Weeks    Status  New    Target Date  09/24/19            Plan - 08/14/19 1216    Clinical Impression Statement  Pt. demonstrated improved DF/Eversion today following implementation of NMES to L Tib Ant. 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 Continue per POC.    Personal Factors and Comorbidities  Time since onset of injury/illness/exacerbation    Examination-Activity Limitations  Locomotion Level;Stand;Stairs;Lift;Squat;Toileting;Continence;Sit;Transfers;Caring for Others;Bend    Examination-Participation Restrictions  Interpersonal Relationship;Yard Work;Community Activity;Meal Prep;Laundry    Stability/Clinical Decision Making  Evolving/Moderate complexity    PT Frequency  2x / week    PT Duration  Other (comment)   10 weeks   PT Treatment/Interventions  ADLs/Self Care Home Management;Aquatic Therapy;Biofeedback;Electrical Stimulation;Gait training;Stair training;Balance training;Therapeutic exercise;Therapeutic activities;Functional mobility training;Neuromuscular re-education;Patient/family education;Manual techniques;Scar mobilization;Dry needling;Taping;Spinal Manipulations;Joint Manipulations    PT Next Visit Plan  re-measure leg-length. TP release vs. DN to, hip-flexors, and R pectineus. re-assess for Piriformis spasm following DN at prior visit    PT Home Exercise Plan  TA in mod-quad, SI belt,  Side-stretch, attempts at ankle pumps, hip-flexor stretch and TP release, NMES 10 min. 2x/day at contraction level on Ant. Tib.    Consulted and Agree with Plan of Care  Patient       Patient will benefit from skilled therapeutic intervention in order to improve the following deficits and impairments:  Abnormal gait, Decreased balance, Decreased endurance, Difficulty walking, Increased muscle spasms, Impaired tone, Improper body mechanics, Decreased activity tolerance, Decreased coordination, Decreased strength, Hypermobility, Postural dysfunction, Pain  Visit Diagnosis: Foot drop, left  Other muscle spasm  Sacrococcygeal disorders, not elsewhere classified     Problem List Patient Active Problem List   Diagnosis Date Noted  . Encounter for elective induction of labor 07/04/2019  . Maternal varicella, non-immune 11/20/2018   Willa Rough DPT, ATC Willa Rough 08/14/2019, 12:38 PM  [place] MAIN [facility] SERVICES [address] [place], [place], [address] Phone: [phone]   Fax:  [phone]  Name: Lisa Brady MRN: [id number] Date of Birth: 01-14-1986

## 2019-08-14 NOTE — Patient Instructions (Signed)
   Taping: A day: Tape on Lateral leg, pull from toes up to lateral leg. Use NMES on medial leg B day: Tape on Medial leg, pull from medial/plantar surface of foot up to medial/anterior leg Use NMES on lateral leg.   ** When using NMES, actively try to contract the muscle as it tries to contract with the stimulation, relax when it stops.  Kegel exercises:    With neutral spine, tighten pelvic floor by imagining you are stopping the flow of urine, squeezing only around the vagina and anus.  Quick-Flicks: Pull up and in quickly and then relax allowing just enough time for the muscles to full lengthen before the next contraction. Do _10__ repetitions in a row, stopping if the pelvic floor muscle gets tired and other muscles try to take over.  Long-Holds: Hold for _? As long as possible (up to 10 sec.)__ seconds and then fully release, repeat _5__ times. (only hold until you can feel the muscles "give up")  Repeat both of these exercises _3__ times throughout the day     If this is difficult, start by performing while lying down as shown. As you get stronger, you can try in sitting, standing, etc. Make sure that you are NOT contracting your buttocks or inner thigh muscles!  Tip: If you are having a hard time feeling the muscles, try using a hand-held mirror to observe the muscles lift and draw in or feel with your hand to make sure you are squeezing when you think you are squeezing, not bearing down. Some people find it helpful to pretend you are "picking blueberries" with the vagina to feel the drawing in or try to make the clitoris "nod" and pull your sitz bones in toward the middle.     Hold for 5 deep breaths, repeat 2-3 times, 1-2 times per day.

## 2019-08-15 ENCOUNTER — Encounter (INDEPENDENT_AMBULATORY_CARE_PROVIDER_SITE_OTHER): Payer: No Typology Code available for payment source | Admitting: Family Medicine

## 2019-08-15 DIAGNOSIS — H01003 Unspecified blepharitis right eye, unspecified eyelid: Secondary | ICD-10-CM | POA: Diagnosis not present

## 2019-08-15 MED ORDER — ERYTHROMYCIN 5 MG/GM OP OINT: 1.0000 "application " | TOPICAL_OINTMENT | Freq: Every day | OPHTHALMIC | 0 refills | Status: DC

## 2019-08-15 NOTE — Telephone Encounter (Addendum)
Virtual Visit via mychart Note  I connected with Carrolyn Leigh via [employer] email   Discussed with the patient that there may be a patient responsible charge related to this service. The patient expressed understanding and agreed to proceed.  Aware in office appts available if needed.   History of Present Illness:  Patient sent in photos showing swelling of right eye, concerned with blepharitis.    Observations/Objective:  Visibly able to see in photos the right eye is swollen. It appears consistent with blepharitis.   Assessment and Plan:  Blepharitis right eye   Follow Up Instructions:  She will use erythromycin ointment QHS for 7-14 days.     The patient was advised to call back or seek an in-person evaluation if the symptoms worsen or if the condition fails to improve as anticipated.  I provided 5 minutes of non-face-to-face time during this encounter.   Lisa Green, FNP

## 2019-08-17 ENCOUNTER — Ambulatory Visit (INDEPENDENT_AMBULATORY_CARE_PROVIDER_SITE_OTHER): Payer: No Typology Code available for payment source | Admitting: Certified Nurse Midwife

## 2019-08-17 ENCOUNTER — Other Ambulatory Visit: Payer: Self-pay

## 2019-08-17 DIAGNOSIS — Z8739 Personal history of other diseases of the musculoskeletal system and connective tissue: Secondary | ICD-10-CM | POA: Insufficient documentation

## 2019-08-17 DIAGNOSIS — M21372 Foot drop, left foot: Secondary | ICD-10-CM

## 2019-08-17 DIAGNOSIS — R32 Unspecified urinary incontinence: Secondary | ICD-10-CM | POA: Insufficient documentation

## 2019-08-17 HISTORY — DX: Personal history of other diseases of the musculoskeletal system and connective tissue: Z87.39

## 2019-08-17 NOTE — Patient Instructions (Signed)
Preventive Care 21-35 Years Old, Female Preventive care refers to visits with your health care provider and lifestyle choices that can promote health and wellness. This includes:  A yearly physical exam. This may also be called an annual well check.  Regular dental visits and eye exams.  Immunizations.  Screening for certain conditions.  Healthy lifestyle choices, such as eating a healthy diet, getting regular exercise, not using drugs or products that contain nicotine and tobacco, and limiting alcohol use. What can I expect for my preventive care visit? Physical exam Your health care provider will check your:  Height and weight. This may be used to calculate body mass index (BMI), which tells if you are at a healthy weight.  Heart rate and blood pressure.  Skin for abnormal spots. Counseling Your health care provider may ask you questions about your:  Alcohol, tobacco, and drug use.  Emotional well-being.  Home and relationship well-being.  Sexual activity.  Eating habits.  Work and work environment.  Method of birth control.  Menstrual cycle.  Pregnancy history. What immunizations do I need?  Influenza (flu) vaccine  This is recommended every year. Tetanus, diphtheria, and pertussis (Tdap) vaccine  You may need a Td booster every 10 years. Varicella (chickenpox) vaccine  You may need this if you have not been vaccinated. Human papillomavirus (HPV) vaccine  If recommended by your health care provider, you may need three doses over 6 months. Measles, mumps, and rubella (MMR) vaccine  You may need at least one dose of MMR. You may also need a second dose. Meningococcal conjugate (MenACWY) vaccine  One dose is recommended if you are age 19-21 years and a first-year college student living in a residence hall, or if you have one of several medical conditions. You may also need additional booster doses. Pneumococcal conjugate (PCV13) vaccine  You may need  this if you have certain conditions and were not previously vaccinated. Pneumococcal polysaccharide (PPSV23) vaccine  You may need one or two doses if you smoke cigarettes or if you have certain conditions. Hepatitis A vaccine  You may need this if you have certain conditions or if you travel or work in places where you may be exposed to hepatitis A. Hepatitis B vaccine  You may need this if you have certain conditions or if you travel or work in places where you may be exposed to hepatitis B. Haemophilus influenzae type b (Hib) vaccine  You may need this if you have certain conditions. You may receive vaccines as individual doses or as more than one vaccine together in one shot (combination vaccines). Talk with your health care provider about the risks and benefits of combination vaccines. What tests do I need?  Blood tests  Lipid and cholesterol levels. These may be checked every 5 years starting at age 20.  Hepatitis C test.  Hepatitis B test. Screening  Diabetes screening. This is done by checking your blood sugar (glucose) after you have not eaten for a while (fasting).  Sexually transmitted disease (STD) testing.  BRCA-related cancer screening. This may be done if you have a family history of breast, ovarian, tubal, or peritoneal cancers.  Pelvic exam and Pap test. This may be done every 3 years starting at age 21. Starting at age 30, this may be done every 5 years if you have a Pap test in combination with an HPV test. Talk with your health care provider about your test results, treatment options, and if necessary, the need for more tests.   Follow these instructions at home: Eating and drinking   Eat a diet that includes fresh fruits and vegetables, whole grains, lean protein, and low-fat dairy.  Take vitamin and mineral supplements as recommended by your health care provider.  Do not drink alcohol if: ? Your health care provider tells you not to drink. ? You are  pregnant, may be pregnant, or are planning to become pregnant.  If you drink alcohol: ? Limit how much you have to 0-1 drink a day. ? Be aware of how much alcohol is in your drink. In the [place] one drink equals one 12 oz bottle of beer (355 mL), one 5 oz glass of wine (148 mL), or one 1 oz glass of hard liquor (44 mL). Lifestyle  Take daily care of your teeth and gums.  Stay active. Exercise for at least 30 minutes on 5 or more days each week.  Do not use any products that contain nicotine or tobacco, such as cigarettes, e-cigarettes, and chewing tobacco. If you need help quitting, ask your health care provider.  If you are sexually active, practice safe sex. Use a condom or other form of birth control (contraception) in order to prevent pregnancy and STIs (sexually transmitted infections). If you plan to become pregnant, see your health care provider for a preconception visit. What's next?  Visit your health care provider once a year for a well check visit.  Ask your health care provider how often you should have your eyes and teeth checked.  Stay up to date on all vaccines. This information is not intended to replace advice given to you by your health care provider. Make sure you discuss any questions you have with your health care provider. Document Released: 01/18/2002 Document Revised: 08/03/2018 Document Reviewed: 08/03/2018 Elsevier Patient Education  2020 [employer]  

## 2019-08-17 NOTE — Progress Notes (Signed)
Subjective:    Lisa Brady is a 33 y.o. G53P1001 Caucasian female who presents for a postpartum visit. She is 6 weeks postpartum following a Vaginal with Vacuum assist ( Dr. Marcelline Mates) at 40.6 gestational weeks. Anesthesia: epidural. I have fully reviewed the prenatal and intrapartum course. Postpartum course has been complicated by pelvic floor dysfunction -left food drop and urinary incontinence. Baby's course has been WNL. Baby is feeding by transisitoning to bottle. Bleeding no bleeding. Bowel function is normal. Bladder function is abnormal: pelvic floor PT for urinary incontenence . Improving . Patient is not sexually active. Last sexual activity: prior to delivery. Contraception method is oral progesterone-only contraceptive . Postpartum depression screening: negative. Score .3.  Last pap 2019 ( per pt)  and was negative/Negative HPV.   The following portions of the patient's history were reviewed and updated as appropriate: allergies, current medications, past medical history, past surgical history and problem list.  Review of Systems Pertinent items are noted in HPI.   There were no vitals filed for this visit. No LMP recorded.  Objective:   General:  alert, cooperative and no distress   Breasts:  deferred, no complaints  Lungs: clear to auscultation bilaterally  Heart:  regular rate and rhythm  Abdomen: soft, nontender   Vulva: normal  Vagina: normal vagina  Cervix:  closed  Corpus: Well-involuted  Adnexa:  Non-palpable  Rectal Exam: No hemorrhoids        Assessment:   Postpartum exam w ks s/p Vaginal delivery with vacuum assistance  Breast/bottle feeding Depression screening Contraception counseling   Plan:  : oral progesterone-only contraceptive, transitioning to bottle feeding- will call for OCP when ready.  Follow up in: 12 months for annual exam or earlier if needed  Philip Aspen, CNM

## 2019-08-21 ENCOUNTER — Other Ambulatory Visit: Payer: Self-pay

## 2019-08-21 ENCOUNTER — Ambulatory Visit: Payer: No Typology Code available for payment source

## 2019-08-21 DIAGNOSIS — M533 Sacrococcygeal disorders, not elsewhere classified: Secondary | ICD-10-CM

## 2019-08-21 DIAGNOSIS — M62838 Other muscle spasm: Secondary | ICD-10-CM

## 2019-08-21 DIAGNOSIS — M21372 Foot drop, left foot: Secondary | ICD-10-CM | POA: Diagnosis not present

## 2019-08-21 NOTE — Patient Instructions (Addendum)
   Hold for 5 deep breaths, repeat 2-3 times with the knee straight, then bent.  NMES: Do 10 min. X 3 with pads both on inner and outer pads at the same time. Use a towel to help lift foot for the first ~3 and after the muscles start to tire.      Let the top arm rest on your side, and slide along the torso as you rotate. Breathe in as you come forward, out as you open up. Do 2x15 on each side. (extra on tighter side)

## 2019-08-21 NOTE — Therapy (Signed)
[place] MAIN [facility] SERVICES [address] [place], [place], [address] Phone: [phone]   Fax:  [phone]  Physical Therapy Treatment  The patient has been informed of current processes in place at Outpatient Rehab to protect patients from Covid-19 exposure including social distancing, schedule modifications, and new cleaning procedures. After discussing their particular risk with a therapist based on the patient's personal risk factors, the patient has decided to proceed with in-person therapy.   Patient Details  Name: Lisa Brady MRN: [id number] Date of Birth: 09-May-1986 Referring Provider (PT): [name]   Encounter Date: 08/21/2019  PT End of Session - 08/21/19 1434    Visit Number  6    Number of Visits  20    Date for PT Re-Evaluation  09/24/19    Authorization Type  [place]    Authorization - Visit Number  6    Authorization - Number of Visits  20    PT Start Time  1000    PT Stop Time  1100    PT Time Calculation (min)  60 min    Equipment Utilized During Treatment  --   SI belt   Activity Tolerance  Patient tolerated treatment well;No increased pain    Behavior During Therapy  WFL for tasks assessed/performed       Past Medical History:  Diagnosis Date  . Foot drop     Past Surgical History:  Procedure Laterality Date  . WISDOM TOOTH EXTRACTION  2006    There were no vitals filed for this visit.   Pelvic Floor Physical Therapy Treatment Note  SCREENING  Changes in medications, allergies, or medical history?: none   SUBJECTIVE  Patient reports: Had her 6 week appointment and was told she was not getting a kegel contraction (in butterfly position) Has been trying to do contraction with NMES but sometimes is not because she is doing it while she breastfeeds. Walked to her neighbor's house and back which is ~ 2x the distance she has been walking.  Precautions:  6 weeks PP  Pain  update:  Location of pain: LLB/side Current pain:  0/10 (at rest) Max pain:  2/10 (discomfort/ache) Least pain:  0/10 Nature of pain:   Patient Goals: Control her bladder/not need depends.   OBJECTIVE  Changes in: Palpation: TTP to R QL, Piriformis , Glute Min, and lumbar paraspinals.  Strength:  Able to perform INV and DF against gravity with tennis shoe on through partial ROM. Able to hold against gravity at ~ 90% of ROM with initial assist to attain position.  ROM: AROM: ~ 50% of contralateral AROM     INTERVENTIONS THIS SESSION: Manual: Performed TP release to R QL, Piriformis , Glute Min, and lumbar paraspinals to decrease spasm and pain and allow for improved balance of musculature for improved function and decreased symptoms. Therex: Educated on and practiced bow-and arrow rotations to improve lumbar mobility and decrease pressure on nerve roots from scoliosis. Practiced DF with min. Assist for full flexion ROM through ~ 30% of the exercise. For 10 min. With concurrent NMES on anterior inverters and everters to improve foot-drop and continue to stimulate the tibial nerve.   Total time: 60 min.                               PT Short Term Goals - 07/16/19 1606      PT SHORT TERM GOAL #1  Title  Patient will demonstrate improved pelvic alignment and balance of musculature surrounding the pelvis to facilitate decreased PFM spasms and decrease pressure on nerve roots to allow for optimal PFM function    Baseline  Has L foot-drop and pain in L glutes/knee as well as inability to sense PFM and L posterior innominate rotation/spasms surrounding pelvis.    Time  5    Period  Weeks    Status  New    Target Date  08/20/19      PT SHORT TERM GOAL #2   Title  Patient will demonstrate HEP x1 in the clinic to demonstrate understanding and proper form to allow for further improvement.    Baseline  Pt. lacks knowledge of therapeutic ecersise that will  decrease Sx.    Time  5    Period  Weeks    Status  New    Target Date  08/20/19      PT SHORT TERM GOAL #3   Title  Pt. will be able to walk 250 Ft.with CGA and w/o assistive device to demonstrate improved balance/decreased instability and foot drop.    Baseline  Pt. using RW to AMB due to L foot-drop and increased instability.    Time  5    Period  Weeks    Status  New    Target Date  08/20/19      PT SHORT TERM GOAL #4   Title  Patient will demonstrate a coordinated contraction, relaxation, and bulge of the pelvic floor muscles to demonstrate functional recruitment and motion and allow for further strengthening.    Baseline  Pt. reports inability to sense the PFM for kegel or relaxation, having mixed UI    Time  5    Period  Weeks    Status  New    Target Date  08/20/19        PT Long Term Goals - 07/16/19 1616      PT LONG TERM GOAL #1   Title  Patient will report no episodes of UUI/SUI over the course of the prior two weeks to demonstrate improved functional ability.    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    Baseline  having mild pain with initial penetration    Time  10    Period  Weeks    Status  New    Target Date  09/24/19      PT LONG TERM GOAL #3   Title  Patient will score at or below 22/100 on the UDI and 27/100 on the UIQ to demonstrate a clinically meaningful decrease in disability and distress due to pelvic floor dysfunction.    Baseline  UDI: 67, UIQ: 72/100    Time  10    Period  Weeks    Status  New    Target Date  09/24/19      PT LONG TERM GOAL #4   Title  Pt. 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     Baseline  Pt. requiring RW for AMB due  to L foot-drop and imbalance.    Time  10    Period  Weeks    Status  New    Target Date  09/24/19            Plan - 08/21/19 1434    Clinical Impression Statement  Pt. Demonstrated improved DF/EV/InV today, demonstrating ability to move through partial ROM actively w/o stim and while wearing tennis shoes. She has had 5 SUI accidents over the past week. She responded well to all interventions today, demonstrating improved recruitment of DF with NMES and decreased spasm/pain as well as understanding of all education provided. Continue per POC.    Personal Factors and Comorbidities  Time since onset of injury/illness/exacerbation    Examination-Activity Limitations  Locomotion Level;Stand;Stairs;Lift;Squat;Toileting;Continence;Sit;Transfers;Caring for Others;Bend    Examination-Participation Restrictions  Interpersonal Relationship;Yard Work;Community Activity;Meal Prep;Laundry    Stability/Clinical Decision Making  Evolving/Moderate complexity    PT Frequency  2x / week    PT Duration  Other (comment)   10 weeks   PT Treatment/Interventions  ADLs/Self Care Home Management;Aquatic Therapy;Biofeedback;Electrical Stimulation;Gait training;Stair training;Balance training;Therapeutic exercise;Therapeutic activities;Functional mobility training;Neuromuscular re-education;Patient/family education;Manual techniques;Scar mobilization;Dry needling;Taping;Spinal Manipulations;Joint Manipulations    PT Next Visit Plan  Look closer at scoliosis and multifidus fro recurring Piriformis spasm. re-measure leg-length. TP release vs. DN to, hip-flexors, and R pectineus. re-assess for Piriformis spasm (have performed DN and TP release now)    PT Home Exercise Plan  TA in mod-quad, SI belt, Side-stretch, attempts at ankle pumps, hip-flexor stretch and TP release, NMES 10 min. 2x/day at contraction level on Ant. Tib.    Consulted and Agree with Plan of Care  Patient        Patient will benefit from skilled therapeutic intervention in order to improve the following deficits and impairments:  Abnormal gait, Decreased balance, Decreased endurance, Difficulty walking, Increased muscle spasms, Impaired tone, Improper body mechanics, Decreased activity tolerance, Decreased coordination, Decreased strength, Hypermobility, Postural dysfunction, Pain  Visit Diagnosis: Foot drop, left  Other muscle spasm  Sacrococcygeal disorders, not elsewhere classified     Problem List Patient Active Problem List   Diagnosis Date Noted  . Urinary incontinence 08/17/2019  . Foot drop, left 08/17/2019  . Maternal varicella, non-immune 11/20/2018   Willa Rough DPT, ATC Willa Rough 08/21/2019, 3:00 PM  [place] MAIN [facility] SERVICES [address] [place], [place], [address] Phone: [phone]   Fax:  [phone]  Name: Lisa Brady MRN: [id number] Date of Birth: September 09, 1986

## 2019-08-24 ENCOUNTER — Other Ambulatory Visit: Payer: Self-pay

## 2019-08-24 ENCOUNTER — Ambulatory Visit: Payer: No Typology Code available for payment source

## 2019-08-24 DIAGNOSIS — M533 Sacrococcygeal disorders, not elsewhere classified: Secondary | ICD-10-CM

## 2019-08-24 DIAGNOSIS — M62838 Other muscle spasm: Secondary | ICD-10-CM

## 2019-08-24 DIAGNOSIS — M21372 Foot drop, left foot: Secondary | ICD-10-CM | POA: Diagnosis not present

## 2019-08-24 NOTE — Therapy (Signed)
[place] MAIN [facility] SERVICES [address] [place], [place], [address] Phone: [phone]   Fax:  [phone]  Physical Therapy Treatment  The patient has been informed of current processes in place at Outpatient Rehab to protect patients from Covid-19 exposure including social distancing, schedule modifications, and new cleaning procedures. After discussing their particular risk with a therapist based on the patient's personal risk factors, the patient has decided to proceed with in-person therapy.   Patient Details  Name: Lisa Brady MRN: [id number] Date of Birth: September 18, 1986 Referring Provider (PT): [name]   Encounter Date: 08/24/2019  PT End of Session - 08/27/19 1425    Visit Number  7    Number of Visits  20    Date for PT Re-Evaluation  09/24/19    Authorization Type  [place]    Authorization - Visit Number  7    Authorization - Number of Visits  20    PT Start Time  0800    PT Stop Time  0900    PT Time Calculation (min)  60 min    Equipment Utilized During Treatment  --   SI belt   Activity Tolerance  Patient tolerated treatment well;No increased pain    Behavior During Therapy  WFL for tasks assessed/performed       Past Medical History:  Diagnosis Date  . Foot drop     Past Surgical History:  Procedure Laterality Date  . WISDOM TOOTH EXTRACTION  2006    There were no vitals filed for this visit.    Pelvic Floor Physical Therapy Treatment Note  SCREENING  Changes in medications, allergies, or medical history?: none   SUBJECTIVE  Patient reports: Had one urinary incontinence episode. When laying on L side, R side feels "compressed".   Precautions:  6 weeks PP  Pain update:  Location of pain: LLB/side Current pain:  0/10 (at rest) Max pain:  2/10 (discomfort/ache) Least pain:  0/10 Nature of pain:   Patient Goals: Control her bladder/not need depends.   OBJECTIVE  Changes  in: Palpation: TTP to R QL, Piriformis , Glute Min, and lumbar paraspinals.  Pelvic Floor External Exam: Introitus Appears: mild gaping at posterior fourchette from tearing  Skin integrity: mildly reddened Palpation: TTP to STP B and R bulbo Cough: greater posterior than anterior recruitment, loss of urine Prolapse visible?: no Scar mobility: decreased, still healing  Internal Vaginal Exam: Strength (PERF): 1/5 Symmetry: greater TTP on L>R Palpation: TTP throughout all  Prolapse: mild anterior wall prolapse ~1 cm above level of introitus.    INTERVENTIONS THIS SESSION: Manual: Performed PFM assessment and TP release to L PR/PC both anterior and posteriorly and to R anterior PR/PC to decrease spasm and pain and allow for improved balance of musculature for improved function and decreased symptoms. Educated on where to get self-release tool and options for decreasing PFM spasms.   Total time: 60 min.                            PT Short Term Goals - 07/16/19 1606      PT SHORT TERM GOAL #1   Title  Patient will demonstrate improved pelvic alignment and balance of musculature surrounding the pelvis to facilitate decreased PFM spasms and decrease pressure on nerve roots to allow for optimal PFM function    Baseline  Has L foot-drop and pain in L glutes/knee as well as inability  to sense PFM and L posterior innominate rotation/spasms surrounding pelvis.    Time  5    Period  Weeks    Status  New    Target Date  08/20/19      PT SHORT TERM GOAL #2   Title  Patient will demonstrate HEP x1 in the clinic to demonstrate understanding and proper form to allow for further improvement.    Baseline  Pt. lacks knowledge of therapeutic ecersise that will decrease Sx.    Time  5    Period  Weeks    Status  New    Target Date  08/20/19      PT SHORT TERM GOAL #3   Title  Pt. will be able to walk 250 Ft.with CGA and w/o assistive device to demonstrate improved  balance/decreased instability and foot drop.    Baseline  Pt. using RW to AMB due to L foot-drop and increased instability.    Time  5    Period  Weeks    Status  New    Target Date  08/20/19      PT SHORT TERM GOAL #4   Title  Patient will demonstrate a coordinated contraction, relaxation, and bulge of the pelvic floor muscles to demonstrate functional recruitment and motion and allow for further strengthening.    Baseline  Pt. reports inability to sense the PFM for kegel or relaxation, having mixed UI    Time  5    Period  Weeks    Status  New    Target Date  08/20/19        PT Long Term Goals - 07/16/19 1616      PT LONG TERM GOAL #1   Title  Patient will report no episodes of UUI/SUI over the course of the prior two weeks to demonstrate improved functional ability.    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    Baseline  having mild pain with initial penetration    Time  10    Period  Weeks    Status  New    Target Date  09/24/19      PT LONG TERM GOAL #3   Title  Patient will score at or below 22/100 on the UDI and 27/100 on the UIQ to demonstrate a clinically meaningful decrease in disability and distress due to pelvic floor dysfunction.    Baseline  UDI: 67, UIQ: 72/100    Time  10    Period  Weeks    Status  New    Target Date  09/24/19      PT LONG TERM GOAL #4   Title  Pt. 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    Baseline  Pt. requiring RW for AMB due to L foot-drop and imbalance.    Time  10    Period  Weeks    Status  New    Target Date  09/24/19            Plan - 08/27/19 1426    Clinical Impression  Statement  Pt. Responded well to all interventions today, demonstrating decreased PFM spasms and understanding and correct performance of all education and exercises provided today. They will continue to benefit from skilled physical therapy to work toward remaining goals and maximize function as well as decrease likelihood of symptom increase or recurrence.     Personal Factors and Comorbidities  Time since onset of injury/illness/exacerbation    Examination-Activity Limitations  Locomotion Level;Stand;Stairs;Lift;Squat;Toileting;Continence;Sit;Transfers;Caring for Others;Bend    Examination-Participation Restrictions  Interpersonal Relationship;Yard Work;Community Activity;Meal Prep;Laundry    Stability/Clinical Decision Making  Evolving/Moderate complexity    PT Frequency  2x / week    PT Duration  Other (comment)   10 weeks   PT Treatment/Interventions  ADLs/Self Care Home Management;Aquatic Therapy;Biofeedback;Electrical Stimulation;Gait training;Stair training;Balance training;Therapeutic exercise;Therapeutic activities;Functional mobility training;Neuromuscular re-education;Patient/family education;Manual techniques;Scar mobilization;Dry needling;Taping;Spinal Manipulations;Joint Manipulations    PT Next Visit Plan  Look closer at scoliosis and multifidus fro recurring Piriformis spasm. re-measure leg-length. TP release vs. DN to, hip-flexors, and R pectineus. re-assess for Piriformis spasm (have performed DN and TP release now)    PT Home Exercise Plan  TA in mod-quad, SI belt, Side-stretch, attempts at ankle pumps, hip-flexor stretch and TP release, NMES 10 min. 2x/day at contraction level on Ant. Tib.    Consulted and Agree with Plan of Care  Patient       Patient will benefit from skilled therapeutic intervention in order to improve the following deficits and impairments:  Abnormal gait, Decreased balance, Decreased endurance, Difficulty walking, Increased muscle spasms, Impaired tone,  Improper body mechanics, Decreased activity tolerance, Decreased coordination, Decreased strength, Hypermobility, Postural dysfunction, Pain  Visit Diagnosis: Foot drop, left  Other muscle spasm  Sacrococcygeal disorders, not elsewhere classified     Problem List Patient Active Problem List   Diagnosis Date Noted  . Urinary incontinence 08/17/2019  . Foot drop, left 08/17/2019  . Maternal varicella, non-immune 11/20/2018   Willa Rough DPT, ATC Willa Rough 08/27/2019, 2:28 PM  [place] MAIN [facility] SERVICES [address] [place], [place], [address] Phone: [phone]   Fax:  [phone]  Name: Lisa Brady MRN: [id number] Date of Birth: 12/10/1985

## 2019-08-24 NOTE — Patient Instructions (Addendum)
    This is The QL muscle  To perform release on this muscle, start by getting into this position by bridging the hips up and then slowly lowering your back, then your butt down to lengthen the low back then put the ball under you where you feel the tender spot and roll to the same side slightly to add pressure as needed. Hold still and take deep breaths until the pain is at least 50% less or, ideally, just pressure.   This is your piriformis    To release this muscle start in this position with your ankle crossed over the opposite knee. Place the tennis ball under your buttock where the tender spot is and then slightly roll your weight to the same side to put just enough pressure that it is uncomfortable. Hold and take deep breaths until the pain is at least 50% less or, ideally ,just pressure.   This is your Gluteus Medius and Minimus  To perform release on this muscle, start by getting into this position by bridging the hips up and then slowly lowering your back, then your butt down to lengthen the low back then put the ball under you where you feel the tender spot and roll to the same side slightly to add pressure as needed. Hold still and take deep breaths until the pain is at least 50% less or, ideally, just pressure.   These are your deep hip-flexor muscles. They are easiest to reach where they come together at the hip.   To perform release on this muscle, start by getting into this position by laying on your stomach then put the ball under you where you feel the tender spot and bring the opposite knee up/out to the side to add pressure as needed. Hold still and take deep breaths until the pain is at least 50% less or, ideally ,just pressure.    [employer] internal trigger point release tool [phone]  Dr. Kathline Magic [employer]state Massager   [phone]

## 2019-08-27 ENCOUNTER — Ambulatory Visit: Payer: No Typology Code available for payment source

## 2019-08-27 ENCOUNTER — Other Ambulatory Visit: Payer: Self-pay

## 2019-08-27 DIAGNOSIS — M62838 Other muscle spasm: Secondary | ICD-10-CM

## 2019-08-27 DIAGNOSIS — M533 Sacrococcygeal disorders, not elsewhere classified: Secondary | ICD-10-CM

## 2019-08-27 DIAGNOSIS — M21372 Foot drop, left foot: Secondary | ICD-10-CM | POA: Diagnosis not present

## 2019-08-27 NOTE — Therapy (Signed)
[place] MAIN [facility] SERVICES [address] [place], [place], [address] Phone: [phone]   Fax:  [phone]  Physical Therapy Treatment  The patient has been informed of current processes in place at Outpatient Rehab to protect patients from Covid-19 exposure including social distancing, schedule modifications, and new cleaning procedures. After discussing their particular risk with a therapist based on the patient's personal risk factors, the patient has decided to proceed with in-person therapy.   Patient Details  Name: Lisa Brady MRN: [id number] Date of Birth: Dec 25, 1985 Referring Provider (PT): [name]   Encounter Date: 08/27/2019  PT End of Session - 08/28/19 1410    Visit Number  8    Number of Visits  20    Date for PT Re-Evaluation  09/24/19    Authorization - Visit Number  8    Authorization - Number of Visits  20    PT Start Time  1430    PT Stop Time  [id number]    PT Time Calculation (min)  60 min    Activity Tolerance  Patient tolerated treatment well;No increased pain    Behavior During Therapy  WFL for tasks assessed/performed       Past Medical History:  Diagnosis Date  . Foot drop     Past Surgical History:  Procedure Laterality Date  . WISDOM TOOTH EXTRACTION  2006    There were no vitals filed for this visit.   Pelvic Floor Physical Therapy Treatment Note  SCREENING  Changes in medications, allergies, or medical history?: none   SUBJECTIVE  Patient reports: Only has had minor leakage with coughing once. Has had some LBP over the weekend.   Precautions:  7 weeks PP  Pain update:  Location of pain: LLB/side L hip flexor Current pain:  0/10 (at rest) Max pain:  4/10 (discomfort/ache) Least pain:  0/10 Nature of pain: dull achy  Patient Goals: Control her bladder/not need depends.   OBJECTIVE  Changes in: Palpation: TTP to B Erector spinae and Lumbar Multifidus at L1 and L5 with  referred pain into hips where "familiar pain" is.  Observation:  Pt. Demonstrates difficulty engaging deep-core musculature in sitting for pelvic tilt  *Improved following treatment.  INTERVENTIONS THIS SESSION: Manual: Performed TP release to  B Erector spinae and Lumbar Multifidus at L1 and L5 to decrease spasm and pain and allow for improved balance of musculature for improved function and decreased symptoms.  Dry-needle: Performed TPDN as described below with .30x52mm needle and standard approach to decrease spasm and pain and allow for improved balance of musculature for improved function and decreased symptoms. Therex: Educated on and practiced seated pelvic tilts and "tea-pot" without band and reviewed planks to improve spinal and pelvic alignment and balance of musculature surrounding both to allow off-loading of nerves exiting the spinal column for optimal recovery and decreased pain/dysfunction.   Total time: 60 min.                            PT Short Term Goals - 07/16/19 1606      PT SHORT TERM GOAL #1   Title  Patient will demonstrate improved pelvic alignment and balance of musculature surrounding the pelvis to facilitate decreased PFM spasms and decrease pressure on nerve roots to allow for optimal PFM function    Baseline  Has L foot-drop and pain in L glutes/knee as well as inability to sense PFM and L  posterior innominate rotation/spasms surrounding pelvis.    Time  5    Period  Weeks    Status  New    Target Date  08/20/19      PT SHORT TERM GOAL #2   Title  Patient will demonstrate HEP x1 in the clinic to demonstrate understanding and proper form to allow for further improvement.    Baseline  Pt. lacks knowledge of therapeutic ecersise that will decrease Sx.    Time  5    Period  Weeks    Status  New    Target Date  08/20/19      PT SHORT TERM GOAL #3   Title  Pt. will be able to walk 250 Ft.with CGA and w/o assistive device to  demonstrate improved balance/decreased instability and foot drop.    Baseline  Pt. using RW to AMB due to L foot-drop and increased instability.    Time  5    Period  Weeks    Status  New    Target Date  08/20/19      PT SHORT TERM GOAL #4   Title  Patient will demonstrate a coordinated contraction, relaxation, and bulge of the pelvic floor muscles to demonstrate functional recruitment and motion and allow for further strengthening.    Baseline  Pt. reports inability to sense the PFM for kegel or relaxation, having mixed UI    Time  5    Period  Weeks    Status  New    Target Date  08/20/19        PT Long Term Goals - 07/16/19 1616      PT LONG TERM GOAL #1   Title  Patient will report no episodes of UUI/SUI over the course of the prior two weeks to demonstrate improved functional ability.    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    Baseline  having mild pain with initial penetration    Time  10    Period  Weeks    Status  New    Target Date  09/24/19      PT LONG TERM GOAL #3   Title  Patient will score at or below 22/100 on the UDI and 27/100 on the UIQ to demonstrate a clinically meaningful decrease in disability and distress due to pelvic floor dysfunction.    Baseline  UDI: 67, UIQ: 72/100    Time  10    Period  Weeks    Status  New    Target Date  09/24/19      PT LONG TERM GOAL #4   Title  Pt. 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    Baseline  Pt. requiring RW for AMB due to L foot-drop and imbalance.    Time  10    Period  Weeks    Status  New    Target Date  09/24/19            Plan - 08/27/19 1529     Clinical Impression Statement  Pt. Responded  well to all interventions today, demonstrating improved ability to recruit deep-core in sitting and hook-lying, decreased back spasms and tenderness, as well as understanding and correct performance of all education and exercises provided today. They will continue to benefit from skilled physical therapy to work toward remaining goals and maximize function as well as decrease likelihood of symptom increase or recurrence.     Personal Factors and Comorbidities  Time since onset of injury/illness/exacerbation    Examination-Activity Limitations  Locomotion Level;Stand;Stairs;Lift;Squat;Toileting;Continence;Sit;Transfers;Caring for Others;Bend    Examination-Participation Restrictions  Interpersonal Relationship;Yard Work;Community Activity;Meal Prep;Laundry    Stability/Clinical Decision Making  Evolving/Moderate complexity    PT Frequency  2x / week    PT Duration  Other (comment)   10 weeks   PT Treatment/Interventions  ADLs/Self Care Home Management;Aquatic Therapy;Biofeedback;Electrical Stimulation;Gait training;Stair training;Balance training;Therapeutic exercise;Therapeutic activities;Functional mobility training;Neuromuscular re-education;Patient/family education;Manual techniques;Scar mobilization;Dry needling;Taping;Spinal Manipulations;Joint Manipulations    PT Next Visit Plan  Internal TP release and DN to recurring Piriformis spasm. re-measure leg-length. TP release vs. DN to, hip-flexors, and R pectineus. re-assess for Piriformis spasm (have performed DN and TP release now)    PT Home Exercise Plan  TA in mod-quad, SI belt, Side-stretch, attempts at ankle pumps, hip-flexor stretch and TP release, NMES 10 min. 2x/day at contraction level on Ant. Tib., posterior pelvic tilt in seated    Consulted and Agree with Plan of Care  Patient       Patient will benefit from skilled therapeutic intervention in order to improve the following deficits and  impairments:  Abnormal gait, Decreased balance, Decreased endurance, Difficulty walking, Increased muscle spasms, Impaired tone, Improper body mechanics, Decreased activity tolerance, Decreased coordination, Decreased strength, Hypermobility, Postural dysfunction, Pain  Visit Diagnosis: Foot drop, left  Other muscle spasm  Sacrococcygeal disorders, not elsewhere classified     Problem List Patient Active Problem List   Diagnosis Date Noted  . Urinary incontinence 08/17/2019  . Foot drop, left 08/17/2019  . Maternal varicella, non-immune 11/20/2018   Willa Rough DPT, ATC Willa Rough 08/28/2019, 2:14 PM  [place] MAIN [facility] SERVICES [address] [place], [place], [address] Phone: [phone]   Fax:  [phone]  Name: Lisa Brady MRN: [id number] Date of Birth: 24-Jul-1986

## 2019-08-27 NOTE — Patient Instructions (Signed)
Self External Trigger Point Relief    1) Wash your hands and prop yourself up in a way where you can easily reach the vagina. You may wish to have a small hand-held mirror near by.  2) Use the 2 middle fingers to put gentle pressure on the three external pelvic floor muscles and hold pressure and take deep breaths as you allow the tension to release and discomfort to dissipate   3) Repeat the process for any trigger points you find spending between 3-10 minutes on this every 1-2 days until you do not find any more trigger points or you are told otherwise by your therapist.    Do 2 sets of 15 tilts per day. Breathe in when you tilt forward (A) and out when you tuck under (B).  See video for teapot exercise Do 2x15 on the one side only.

## 2019-08-28 ENCOUNTER — Ambulatory Visit: Payer: No Typology Code available for payment source

## 2019-08-29 ENCOUNTER — Encounter: Payer: Self-pay | Admitting: Family Medicine

## 2019-08-30 ENCOUNTER — Other Ambulatory Visit: Payer: Self-pay | Admitting: Family Medicine

## 2019-08-30 ENCOUNTER — Ambulatory Visit: Payer: No Typology Code available for payment source

## 2019-08-30 ENCOUNTER — Other Ambulatory Visit: Payer: Self-pay

## 2019-08-30 DIAGNOSIS — M21372 Foot drop, left foot: Secondary | ICD-10-CM | POA: Diagnosis not present

## 2019-08-30 DIAGNOSIS — M62838 Other muscle spasm: Secondary | ICD-10-CM

## 2019-08-30 DIAGNOSIS — H01003 Unspecified blepharitis right eye, unspecified eyelid: Secondary | ICD-10-CM

## 2019-08-30 DIAGNOSIS — M533 Sacrococcygeal disorders, not elsewhere classified: Secondary | ICD-10-CM

## 2019-08-30 DIAGNOSIS — H00011 Hordeolum externum right upper eyelid: Secondary | ICD-10-CM

## 2019-08-30 NOTE — Therapy (Signed)
[place] MAIN [facility] SERVICES [address] [place], [place], [address] Phone: [phone]   Fax:  [phone]  Physical Therapy Treatment  The patient has been informed of current processes in place at Outpatient Rehab to protect patients from Covid-19 exposure including social distancing, schedule modifications, and new cleaning procedures. After discussing their particular risk with a therapist based on the patient's personal risk factors, the patient has decided to proceed with in-person therapy.   Patient Details  Name: Lisa Brady MRN: [id number] Date of Birth: 19-Feb-1986 Referring Provider (PT): [name]   Encounter Date: 08/30/2019  PT End of Session - 08/30/19 1518    Visit Number  9    Number of Visits  20    Date for PT Re-Evaluation  09/24/19    Authorization - Visit Number  9    Authorization - Number of Visits  20    PT Start Time  1300    PT Stop Time  1400    PT Time Calculation (min)  60 min    Activity Tolerance  Patient tolerated treatment well;No increased pain    Behavior During Therapy  WFL for tasks assessed/performed       Past Medical History:  Diagnosis Date  . Foot drop     Past Surgical History:  Procedure Laterality Date  . WISDOM TOOTH EXTRACTION  2006    There were no vitals filed for this visit.   Pelvic Floor Physical Therapy Treatment Note  SCREENING  Changes in medications, allergies, or medical history?: none   SUBJECTIVE  Patient reports: Only having pressure, not pain on the R, tender through L external vulva. Had some battery issues with NMES. TA in quad   Precautions:  7 weeks PP  Pain update:  Location of pain: LLB/side L hip flexor Current pain:  0/10 (at rest) Max pain:  4/10 (discomfort/ache) Least pain:  0/10 Nature of pain: dull achy  Patient Goals: Control her bladder/not need depends.   OBJECTIVE  Changes in: Palpation: TTP to R>L  hip-flexors  Observation:  Pt able to DF to 90 degrees in long-sitting (some tension with PROM at ~ 100 deg.) INV and EV through ~50% of R side ROM actively and perform calf raises bilaterally to full ROM x10, Performed 2 to 50% in SLS following.      INTERVENTIONS THIS SESSION: Manual: Performed TP release to  B Pec major to to decrease spasm and pain and allow for improved ability to perform scapular retractions for improved posture.  Therex: Educated on and practiced seated scapular retractions and chin-tucks to improve upper posture and allow for improved alignment down the spine. Reviewed HEP and modified/organized into "A and B" days to allow for consistent performance of all exercises consistently without overloading. Educated on ankle DF/PF, INV/EV exercises for L ankle and reviewed calf-stretch importance  For improved strength and function of LLE Discontinued use of NMES.    Total time: 60 min.                                   PT Short Term Goals - 07/16/19 1606      PT SHORT TERM GOAL #1   Title  Patient will demonstrate improved pelvic alignment and balance of musculature surrounding the pelvis to facilitate decreased PFM spasms and decrease pressure on nerve roots to allow for optimal PFM function    Baseline  Has L foot-drop and pain in L glutes/knee as well as inability to sense PFM and L posterior innominate rotation/spasms surrounding pelvis.    Time  5    Period  Weeks    Status  New    Target Date  08/20/19      PT SHORT TERM GOAL #2   Title  Patient will demonstrate HEP x1 in the clinic to demonstrate understanding and proper form to allow for further improvement.    Baseline  Pt. lacks knowledge of therapeutic ecersise that will decrease Sx.    Time  5    Period  Weeks    Status  New    Target Date  08/20/19      PT SHORT TERM GOAL #3   Title  Pt. will be able to walk 250 Ft.with CGA and w/o assistive device to demonstrate  improved balance/decreased instability and foot drop.    Baseline  Pt. using RW to AMB due to L foot-drop and increased instability.    Time  5    Period  Weeks    Status  New    Target Date  08/20/19      PT SHORT TERM GOAL #4   Title  Patient will demonstrate a coordinated contraction, relaxation, and bulge of the pelvic floor muscles to demonstrate functional recruitment and motion and allow for further strengthening.    Baseline  Pt. reports inability to sense the PFM for kegel or relaxation, having mixed UI    Time  5    Period  Weeks    Status  New    Target Date  08/20/19        PT Long Term Goals - 07/16/19 1616      PT LONG TERM GOAL #1   Title  Patient will report no episodes of UUI/SUI over the course of the prior two weeks to demonstrate improved functional ability.    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    Baseline  having mild pain with initial penetration    Time  10    Period  Weeks    Status  New    Target Date  09/24/19      PT LONG TERM GOAL #3   Title  Patient will score at or below 22/100 on the UDI and 27/100 on the UIQ to demonstrate a clinically meaningful decrease in disability and distress due to pelvic floor dysfunction.    Baseline  UDI: 67, UIQ: 72/100    Time  10    Period  Weeks    Status  New    Target Date  09/24/19      PT LONG TERM GOAL #4   Title  Pt. 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    Baseline  Pt. requiring RW for AMB due to L foot-drop and imbalance.    Time  10    Period  Weeks    Status  New    Target Date  09/24/19  Plan - 08/30/19 1518    Clinical Impression  Statement  Pt. Responded well to all interventions today, demonstrating strength in the L DF, decreased spasm in the pecs B and improved ability to perform scapular retraction/chin tucks as well as understanding and correct performance of all education and exercises provided today. They will continue to benefit from skilled physical therapy to work toward remaining goals and maximize function as well as decrease likelihood of symptom increase or recurrence.     Personal Factors and Comorbidities  Time since onset of injury/illness/exacerbation    Examination-Activity Limitations  Locomotion Level;Stand;Stairs;Lift;Squat;Toileting;Continence;Sit;Transfers;Caring for Others;Bend    Examination-Participation Restrictions  Interpersonal Relationship;Yard Work;Community Activity;Meal Prep;Laundry    Stability/Clinical Decision Making  Evolving/Moderate complexity    PT Frequency  2x / week    PT Duration  Other (comment)   10 weeks   PT Treatment/Interventions  ADLs/Self Care Home Management;Aquatic Therapy;Biofeedback;Electrical Stimulation;Gait training;Stair training;Balance training;Therapeutic exercise;Therapeutic activities;Functional mobility training;Neuromuscular re-education;Patient/family education;Manual techniques;Scar mobilization;Dry needling;Taping;Spinal Manipulations;Joint Manipulations    PT Next Visit Plan  Internal TP release/ training for partner and DN to recurring Piriformis spasm. re-measure leg-length. TP release vs. DN to, hip-flexors, and R pectineus. re-assess for Piriformis spasm (have performed DN and TP release now)    PT Home Exercise Plan  TA in mod-quad, Side-stretch, multi-directional ankle AROM, hip-flexor stretch and TP release, posterior pelvic tilt in seated, mini-marches, tea-pot, bow-and-arrow, side-plank, calf-stretch, scapular retraction, chin-tucks    Consulted and Agree with Plan of Care  Patient       Patient will benefit from skilled therapeutic intervention  in order to improve the following deficits and impairments:  Abnormal gait, Decreased balance, Decreased endurance, Difficulty walking, Increased muscle spasms, Impaired tone, Improper body mechanics, Decreased activity tolerance, Decreased coordination, Decreased strength, Hypermobility, Postural dysfunction, Pain  Visit Diagnosis: Foot drop, left  Other muscle spasm  Sacrococcygeal disorders, not elsewhere classified     Problem List Patient Active Problem List   Diagnosis Date Noted  . Urinary incontinence 08/17/2019  . Foot drop, left 08/17/2019  . Maternal varicella, non-immune 11/20/2018   Willa Rough DPT, ATC Willa Rough 08/30/2019, 3:22 PM  [place] MAIN [facility] SERVICES [address] [place], [place], [address] Phone: [phone]   Fax:  [phone]  Name: Lisa Brady MRN: [id number] Date of Birth: June 10, 1986

## 2019-08-30 NOTE — Patient Instructions (Signed)
"  A day": Ankle exercises, calf stretch, hip-flexor stretch, side-stretch      "B day"  Pelvic-tilts, mini-march (switch to planks in ~ 1 week), bow and arrow, scapular retractions and chin tucks, tea-pot.      Daily: Kegels, side-planks     PRN: tennis ball release

## 2019-09-03 ENCOUNTER — Other Ambulatory Visit: Payer: Self-pay

## 2019-09-03 ENCOUNTER — Ambulatory Visit: Payer: No Typology Code available for payment source

## 2019-09-03 DIAGNOSIS — M62838 Other muscle spasm: Secondary | ICD-10-CM

## 2019-09-03 DIAGNOSIS — M21372 Foot drop, left foot: Secondary | ICD-10-CM

## 2019-09-03 DIAGNOSIS — M533 Sacrococcygeal disorders, not elsewhere classified: Secondary | ICD-10-CM

## 2019-09-03 NOTE — Therapy (Signed)
[place] MAIN [facility] SERVICES [address] [place], [place], [address] Phone: [phone]   Fax:  [phone]  Physical Therapy Treatment  The patient has been informed of current processes in place at Outpatient Rehab to protect patients from Covid-19 exposure including social distancing, schedule modifications, and new cleaning procedures. After discussing their particular risk with a therapist based on the patient's personal risk factors, the patient has decided to proceed with in-person therapy.   Patient Details  Name: Lisa Brady MRN: [id number] Date of Birth: June 15, 1986 Referring Provider (PT): [name]   Encounter Date: 09/03/2019  PT End of Session - 09/03/19 1641    Visit Number  10    Number of Visits  20    Date for PT Re-Evaluation  09/24/19    Authorization - Visit Number  10    Authorization - Number of Visits  20    PT Start Time  1330    PT Stop Time  1424    PT Time Calculation (min)  54 min    Activity Tolerance  Patient tolerated treatment well;No increased pain    Behavior During Therapy  WFL for tasks assessed/performed       Past Medical History:  Diagnosis Date  . Foot drop     Past Surgical History:  Procedure Laterality Date  . WISDOM TOOTH EXTRACTION  2006    There were no vitals filed for this visit.   Pelvic Floor Physical Therapy Treatment Note  SCREENING  Changes in medications, allergies, or medical history?: none   SUBJECTIVE  Patient reports: Tried to do self release last night. Mild tenderness in posterior fourchette following  Precautions:  8 weeks PP  Pain update:  Location of pain: posterior fouchette Current pain:  1/10  Max pain:  3/10  Least pain:  0/10 Nature of pain: dull achy  Patient Goals: Control her bladder/not need depends.   OBJECTIVE  Changes in: Palpation: TTP to R>L hip-flexors  Observation:  AMB in without walker today.   INTERVENTIONS  THIS SESSION: Manual: Assessed complaint of pain in wrists making it painful for Pt. To perform self TP release by palpating for pec and abductor hallucis on R as well as ruling out nerve contribution. Performed grade 1-2 PA mobs through upper thoracic spine in seated with pain and decreased mobility. Self-care: Educated Pt. And her partner on how to perform internal and posterior fourchette TP release to decease pain and spasm and allow for improved PFM function to decrease SUI.    Total time: 54 min.                              PT Short Term Goals - 07/16/19 1606      PT SHORT TERM GOAL #1   Title  Patient will demonstrate improved pelvic alignment and balance of musculature surrounding the pelvis to facilitate decreased PFM spasms and decrease pressure on nerve roots to allow for optimal PFM function    Baseline  Has L foot-drop and pain in L glutes/knee as well as inability to sense PFM and L posterior innominate rotation/spasms surrounding pelvis.    Time  5    Period  Weeks    Status  New    Target Date  08/20/19      PT SHORT TERM GOAL #2   Title  Patient will demonstrate HEP x1 in the clinic to demonstrate understanding and proper  form to allow for further improvement.    Baseline  Pt. lacks knowledge of therapeutic ecersise that will decrease Sx.    Time  5    Period  Weeks    Status  New    Target Date  08/20/19      PT SHORT TERM GOAL #3   Title  Pt. will be able to walk 250 Ft.with CGA and w/o assistive device to demonstrate improved balance/decreased instability and foot drop.    Baseline  Pt. using RW to AMB due to L foot-drop and increased instability.    Time  5    Period  Weeks    Status  New    Target Date  08/20/19      PT SHORT TERM GOAL #4   Title  Patient will demonstrate a coordinated contraction, relaxation, and bulge of the pelvic floor muscles to demonstrate functional recruitment and motion and allow for further strengthening.     Baseline  Pt. reports inability to sense the PFM for kegel or relaxation, having mixed UI    Time  5    Period  Weeks    Status  New    Target Date  08/20/19        PT Long Term Goals - 07/16/19 1616      PT LONG TERM GOAL #1   Title  Patient will report no episodes of UUI/SUI over the course of the prior two weeks to demonstrate improved functional ability.    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    Baseline  having mild pain with initial penetration    Time  10    Period  Weeks    Status  New    Target Date  09/24/19      PT LONG TERM GOAL #3   Title  Patient will score at or below 22/100 on the UDI and 27/100 on the UIQ to demonstrate a clinically meaningful decrease in disability and distress due to pelvic floor dysfunction.    Baseline  UDI: 67, UIQ: 72/100    Time  10    Period  Weeks    Status  New    Target Date  09/24/19      PT LONG TERM GOAL #4   Title  Pt. 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    Baseline  Pt. requiring RW for AMB due to L foot-drop and imbalance.    Time  10    Period  Weeks    Status  New    Target Date  09/24/19            Plan - 09/03/19 1643    Clinical Impression Statement  Pt. Responded well to all interventions today, with both herself and her partner demonstrating understanding and correct performance of all education and exercises provided today. They will continue to benefit from skilled physical therapy to work toward remaining goals and maximize function as well as decrease likelihood of symptom increase or recurrence.     Personal Factors and Comorbidities  Time since onset of  injury/illness/exacerbation    Examination-Activity Limitations  Locomotion Level;Stand;Stairs;Lift;Squat;Toileting;Continence;Sit;Transfers;Caring for Others;Bend    Examination-Participation Restrictions  Interpersonal Relationship;Yard Work;Community Activity;Meal Prep;Laundry    Stability/Clinical Decision Making  Evolving/Moderate complexity    PT Frequency  2x / week    PT Duration  Other (comment)   10 weeks   PT Treatment/Interventions  ADLs/Self Care Home Management;Aquatic Therapy;Biofeedback;Electrical Stimulation;Gait training;Stair training;Balance training;Therapeutic exercise;Therapeutic activities;Functional mobility training;Neuromuscular re-education;Patient/family education;Manual techniques;Scar mobilization;Dry needling;Taping;Spinal Manipulations;Joint Manipulations    PT Next Visit Plan  Internal TP release/ training for partner and DN to recurring Piriformis spasm. re-measure leg-length. TP release vs. DN to, hip-flexors, and R pectineus. re-assess for Piriformis spasm (have performed DN and TP release now)    PT Home Exercise Plan  TA in mod-quad, Side-stretch, multi-directional ankle AROM, hip-flexor stretch and TP release, posterior pelvic tilt in seated, mini-marches, tea-pot, bow-and-arrow, side-plank, calf-stretch, scapular retraction, chin-tucks    Consulted and Agree with Plan of Care  Patient       Patient will benefit from skilled therapeutic intervention in order to improve the following deficits and impairments:  Abnormal gait, Decreased balance, Decreased endurance, Difficulty walking, Increased muscle spasms, Impaired tone, Improper body mechanics, Decreased activity tolerance, Decreased coordination, Decreased strength, Hypermobility, Postural dysfunction, Pain  Visit Diagnosis: Foot drop, left  Other muscle spasm  Sacrococcygeal disorders, not elsewhere classified     Problem List Patient Active Problem List   Diagnosis Date Noted  . Urinary  incontinence 08/17/2019  . Foot drop, left 08/17/2019  . Maternal varicella, non-immune 11/20/2018   Willa Rough DPT, ATC Willa Rough 09/03/2019, 4:50 PM  [place] MAIN [facility] SERVICES [address] [place], [place], [address] Phone: [phone]   Fax:  [phone]  Name: Caitin Tromp MRN: [id number] Date of Birth: January 15, 1986

## 2019-09-03 NOTE — Patient Instructions (Signed)
Partner Internal Trigger Point Relief    1) Wash your hands and position in a way where you can easily reach the vagina. You may wish to sit beside your partner and let them relax their leg onto you.   2) lubricate the index finger using a hypoallergenic lubricant such as "slippery-stuff".   3) Slowly and gently insert your finger into the vagina using deep breaths to allow relaxation of the muscles around the finger.  4) Avoiding the "12 o-clock" region near the urethra, gently press into the the wall of the pelvic floor and hold pressure until you find an area that is tender, use picture above to think about where different muscle fibers are.  5) Areas that are tender are called "trigger points". When you find one hold pressure still, applying just enough pressure to elicit mild discomfort and allow your partner to take deep belly breaths until the discomfort subsides or decreases by at least 50%. After the first point releases, release pressure before moving the finger, then move to different areas of the pelvic floor and feel for other trigger points.   6) Repeat the process for any trigger points you find spending between 3-10 minutes on this per night until you do not find any more trigger points or you are told otherwise by your therapist.   Self Posterior Fourchette Stretching/Mobilization    1) Wash your hands and prop your body up so you can easily reach the vagina, bring hand-held mirror if desired.  2) Apply lubricant to the thumb and vaginal opening  3) Place thumb ~ 1/2 an inch into the vagina with the pad of the thumb pointed down and apply gentle pressure to the posterior fourchette.  4) Gently sweep the thumb side to side and in/out while maintaining pressure down toward the anus. Make sure the pressure is not so great that your muscles tighten up and guard, just enough to create slight discomfort.  Do this for ~ 3 min. Per night to decrease tightness and tenderness at  the vaginal opening.

## 2019-09-04 ENCOUNTER — Ambulatory Visit: Payer: No Typology Code available for payment source

## 2019-09-07 ENCOUNTER — Encounter: Payer: Self-pay | Admitting: Obstetrics and Gynecology

## 2019-09-07 ENCOUNTER — Ambulatory Visit (INDEPENDENT_AMBULATORY_CARE_PROVIDER_SITE_OTHER): Payer: No Typology Code available for payment source | Admitting: Obstetrics and Gynecology

## 2019-09-07 ENCOUNTER — Other Ambulatory Visit: Payer: Self-pay

## 2019-09-07 VITALS — BP 131/89 | HR 90 | Ht 63.0 in | Wt 130.6 lb

## 2019-09-07 DIAGNOSIS — N644 Mastodynia: Secondary | ICD-10-CM

## 2019-09-07 DIAGNOSIS — M21372 Foot drop, left foot: Secondary | ICD-10-CM

## 2019-09-07 DIAGNOSIS — R4589 Other symptoms and signs involving emotional state: Secondary | ICD-10-CM

## 2019-09-07 DIAGNOSIS — R32 Unspecified urinary incontinence: Secondary | ICD-10-CM | POA: Diagnosis not present

## 2019-09-07 DIAGNOSIS — S39093A Other injury of muscle, fascia and tendon of pelvis, initial encounter: Secondary | ICD-10-CM

## 2019-09-07 NOTE — Progress Notes (Signed)
GYNECOLOGY PROGRESS NOTE  Subjective:    Patient ID: Lisa Brady, female    DOB: 05-13-1986, 33 y.o.   MRN: [id number]  HPI  Patient is a 33 y.o. [id number] female who presents for follow up of pelvic floor dysfunction. She  is approximately 9 weeks  weeks postpartum from a vacuum-assisted vaginal delivery with fetal macrosomia (9 lb 10 oz), with postpartum complication of left lower extremity foot drop and urinary incontinence with continuous leakage (due to urethral trauma secondary to expulsion of an inflated foley catheter during second stage of labor).   She has been undergoing intense physical therapy for her foot drop and urinary incontinence. Continues to note steady progress in both areas, however states that she is still experiencing intermittent episodes of urinary leakage (usually small amounts 1-2 times weekly, and then 1 large accidental voided amount once per week). Reports that her physical therapist has noted that she still has some urethral enlargement. Has difficulties attempting to do Kegel exercises due to decreased sensation of the pelvic floor muscles. She notes frustration as she feels that she is not all the way recovered yet, however she understands that this will take time.  She is no longer requiring assistance with walking which she is happy about (previously required a walker).   Additionally, patient notes that she has been more tearful lately.  She is unsure if it is due to "all of the other things going on", including dealing with postpartum yeast mastitis requiring a 2-week course of antibiotics, and eye infection requiring topical followed by oral antibiotics, cessation of breast-feeding due to inability to maintain ongoing care of both her and her infant's needs.  Also wonders if this could be postpartum depression.  She is still experiencing from mild nipple redness bilaterally, however notes that they are not tender nor is she experiencing any nipple  discharge.  The following portions of the patient's history were reviewed and updated as appropriate: allergies, current medications, past family history, past medical history, past social history, past surgical history and problem list.  Review of Systems Pertinent items noted in HPI and remainder of comprehensive ROS otherwise negative.   Objective:   Blood pressure 131/89, pulse 90, height 5\' 3"  (1.6 m), weight 130 lb 9.6 oz (59.2 kg), currently breastfeeding. General appearance: alert and no distress Breasts: Breasts: breasts appear normal, no suspicious masses, no skin changes or axillary nodes.  Faint redness of nipples noted bilaterally, with no tenderness or masses.     Assessment:   Traumatic disruption of pelvic floor muscle Urinary incontinence in female Foot drop, left Complaining of tearfulness Nipple tenderness  Plan:  - Given reassurance that overall her progress has been steadily improving with the help of physical therapy. Can also refer to Urology for further assistance in management of her urinary incontinence (due to accidental foley bulb dislodgement during labor and pelvic floor dysfunction with neurologic pathology as indicated by foot drop). Referral placed.  - Nipple tenderness with mild erythema (likely residual). Will prescribe all-purpose nipple ointment to use for at least 1 week.  - Tearfulness noted, unsure if due to frustrations of current condition or if postpartum depression.  EPDS score was 8, negative for need for interventions at this time, however discussed that counseling could still be an option even with a negative score.  Can refer if desired.   - To follow up as needed, after assessment by Urology.    A total of 25 minutes were spent face-to-face with  the patient during this encounter and over half of that time involved counseling and coordination of care.   Lisa Maid, MD [facility]

## 2019-09-07 NOTE — Progress Notes (Signed)
Patient comes in today for follow up on foot drop and urinary incontinence. She is also has rash on breast. She was treated for yeast on breast. Medication did not clear up completely.

## 2019-09-10 ENCOUNTER — Ambulatory Visit (INDEPENDENT_AMBULATORY_CARE_PROVIDER_SITE_OTHER): Payer: No Typology Code available for payment source | Admitting: Urology

## 2019-09-10 ENCOUNTER — Ambulatory Visit: Payer: No Typology Code available for payment source | Attending: Obstetrics and Gynecology

## 2019-09-10 ENCOUNTER — Encounter: Payer: Self-pay | Admitting: Urology

## 2019-09-10 ENCOUNTER — Other Ambulatory Visit: Payer: Self-pay

## 2019-09-10 VITALS — BP 120/74 | HR 84 | Ht 63.0 in | Wt 130.0 lb

## 2019-09-10 DIAGNOSIS — M533 Sacrococcygeal disorders, not elsewhere classified: Secondary | ICD-10-CM | POA: Diagnosis present

## 2019-09-10 DIAGNOSIS — N3946 Mixed incontinence: Secondary | ICD-10-CM | POA: Diagnosis not present

## 2019-09-10 DIAGNOSIS — R32 Unspecified urinary incontinence: Secondary | ICD-10-CM | POA: Diagnosis not present

## 2019-09-10 DIAGNOSIS — M62838 Other muscle spasm: Secondary | ICD-10-CM | POA: Insufficient documentation

## 2019-09-10 DIAGNOSIS — M21372 Foot drop, left foot: Secondary | ICD-10-CM | POA: Insufficient documentation

## 2019-09-10 MED ORDER — MIRABEGRON ER 50 MG PO TB24: 50.0000 mg | ORAL_TABLET | Freq: Every day | ORAL | 11 refills | Status: DC

## 2019-09-10 NOTE — Progress Notes (Signed)
09/10/2019 3:34 PM   [place] Constancio August 04, 1986 [id number]  Referring provider: Jodelle Green, FNP [address] [place],   [address]  Chief Complaint  Patient presents with  . Urinary Incontinence    HPI: I was consulted to assess the patient urinary incontinence beginning since year 24-month-old was born.  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  She has normal vaginal rectal sensation but she said it is hard to engage her pelvic muscles and is working with physical therapy.  She said her incontinence has improved but then baselined recently.  She reports a faster than normal stream  She can leak with coughing sneezing and with bending and lifting.  Twice a week she has urge incontinence but running water is a trigger.  No bedwetting.  1 pad a day that is damp.  Prior to pregnancy had occasional stress incontinence not wearing a pad.  Bowel movements normal.  No infection or kidney stone history.  No bladder surgery history.  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 Foot drop     Surgical History: Past Surgical History:  Procedure Laterality Date  . WISDOM TOOTH EXTRACTION  2006    Home Medications:  Allergies as of 09/10/2019   No Known Allergies     Medication List       Accurate as of September 10, 2019  3:34 PM. If you have any questions, ask your nurse or doctor.        minocycline 50 MG capsule Commonly known as: MINOCIN   norethindrone 0.35 MG tablet Commonly known as: MICRONOR Take 1 tablet (0.35 mg total) by mouth daily.   prenatal multivitamin Tabs tablet  Take 1 tablet by mouth daily at 12 noon.       Allergies: No Known Allergies  Family History: Family History  Problem Relation Age of Onset  . Cancer Mother   . Breast cancer Mother   . Melanoma Mother   . Hyperlipidemia Father   . Hypertension Father   . Hyperlipidemia Brother   . Cancer Maternal Grandmother   . Brain cancer Maternal Grandmother   . Heart disease Maternal Grandfather   . Cancer Paternal Grandmother   . Lung cancer Paternal Grandmother   . Cancer Paternal Grandfather   . Lung cancer Paternal Grandfather   . Hyperlipidemia Brother   . Hyperlipidemia Brother   . Hyperlipidemia Brother     Social History:  reports that she has never smoked. She has never used smokeless tobacco. She reports previous alcohol use. She reports that she does not use drugs.  ROS: UROLOGY Frequent Urination?: Yes Hard to postpone urination?: Yes Burning/pain with urination?: No Get up at night to urinate?: No Leakage of urine?: Yes Urine stream starts and stops?: No Trouble starting stream?: No Do you have to strain to urinate?: No Blood in urine?: No Urinary tract infection?: No Sexually transmitted disease?: No Injury to kidneys or bladder?: No Painful intercourse?: No Weak stream?: No Currently pregnant?: No Vaginal bleeding?: No Last menstrual period?: N  Gastrointestinal Nausea?: No Vomiting?: No Indigestion/heartburn?: No Diarrhea?: No Constipation?: No  Constitutional Fever: No Night sweats?:  No Weight loss?: No Fatigue?: No  Skin Skin rash/lesions?: No Itching?: No  Eyes Blurred vision?: No Double vision?: No  Ears/Nose/Throat Sore throat?: No Sinus problems?: No  Hematologic/Lymphatic Swollen glands?: No Easy bruising?: No  Cardiovascular Leg swelling?: No Chest pain?: No  Respiratory Cough?: No Shortness of breath?: No  Endocrine Excessive thirst?: No  Musculoskeletal Back pain?: No Joint pain?: No  Neurological  Headaches?: No Dizziness?: No  Psychologic Depression?: No Anxiety?: No  Physical Exam: BP 120/74   Pulse 84   Ht 5\' 3"  (1.6 m)   Wt 59 kg   BMI 23.03 kg/m   Constitutional:  Alert and oriented, No acute distress. HEENT: [place] AT, moist mucus membranes.  Trachea midline, no masses. Cardiovascular: No clubbing, cyanosis, or edema. Respiratory: Normal respiratory effort, no increased work of breathing. GI: Abdomen is soft, nontender, nondistended, no abdominal masses GU: On pelvic examination patient had mild grade 2 hypermobility of the bladder neck and a mild positive cough test.  No prolapse.  Tissues looked a little bit congested of the anterior vaginal wall.  She had minimal urethral mucosal prolapse at 5 and one could argue a larger urethral meatus Skin: No rashes, bruises or suspicious lesions. Lymph: No cervical or inguinal adenopathy. Neurologic: Grossly intact, no focal deficits, moving all 4 extremities. Psychiatric: Normal mood and affect.  Laboratory Data: Lab Results  Component Value Date   WBC 15.2 (H) 07/06/2019   HGB 9.6 (L) 07/06/2019   HCT 29.0 (L) 07/06/2019   MCV 87.1 07/06/2019   PLT 170 07/06/2019    No results found for: CREATININE  No results found for: PSA  No results found for: TESTOSTERONE  No results found for: HGBA1C  Urinalysis    Component Value Date/Time   APPEARANCEUR Clear 11/17/2018 1145   GLUCOSEU Negative 07/02/2019 1543   BILIRUBINUR neg 07/27/2019 0907   BILIRUBINUR Negative 11/17/2018 1145   PROTEINUR Negative 07/27/2019 0907   PROTEINUR Negative 11/17/2018 1145   UROBILINOGEN 0.2 07/27/2019 0907   NITRITE neg 07/27/2019 0907   NITRITE Negative 11/17/2018 1145   LEUKOCYTESUR Moderate (2+) (A) 07/27/2019 0907   LEUKOCYTESUR Negative 11/17/2018 1145    Pertinent Imaging:   Assessment & Plan: 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  She has a faster flow.  The pathophysiology and role and  timing of urodynamics discussed.  Based upon her age I urethral injectable might be a good option in the future but hopefully with time things will settle down.  Picture was drawn regarding mild urethral prolapse and large meatus and possible role of Foley balloon trauma.  If she ever had treatment for stress incontinence I would perform cystoscopy prior.  For safety reasons it would also be reasonable to do urodynamics in about 3 months because of the potential of a neurogenic bladder.  Urgency does affect her quality life and she is trying to get back to work.  We gave her Myrbetriq 25 mg samples and a 50 mg prescription.  We will order the urodynamics in 2 months to get baseline bladder pressures on assessment.  Then she will follow-up in clinic.  She understands the concept of a urethral injectable if we need to treat the stress components and she is hoping to have children in the future.  She knows that generally I would wait at least a year but it depends upon her quality life and needs.  1. Urinary incontinence, unspecified type  - Urinalysis, Complete  No follow-ups on file.  Reece Packer, MD  [facility] [address] [place], [place] [address] [phone]

## 2019-09-10 NOTE — Therapy (Signed)
[place] MAIN [facility] SERVICES [address] [place], [place], [address] Phone: [phone]   Fax:  [phone]  Physical Therapy Treatment  The patient has been informed of current processes in place at Outpatient Rehab to protect patients from Covid-19 exposure including social distancing, schedule modifications, and new cleaning procedures. After discussing their particular risk with a therapist based on the patient's personal risk factors, the patient has decided to proceed with in-person therapy.   Patient Details  Name: Lisa Brady MRN: [id number] Date of Birth: 05/23/1986 Referring Provider (PT): [name]   Encounter Date: 09/10/2019  PT End of Session - 09/10/19 1640    Visit Number  11    Number of Visits  20    Date for PT Re-Evaluation  09/24/19    Authorization - Visit Number  11    Authorization - Number of Visits  20    PT Start Time  1000    PT Stop Time  1100    PT Time Calculation (min)  60 min    Activity Tolerance  Patient tolerated treatment well;No increased pain    Behavior During Therapy  WFL for tasks assessed/performed       Past Medical History:  Diagnosis Date  . Foot drop     Past Surgical History:  Procedure Laterality Date  . WISDOM TOOTH EXTRACTION  2006    There were no vitals filed for this visit.    Pelvic Floor Physical Therapy Treatment Note  SCREENING  Changes in medications, allergies, or medical history?: none   SUBJECTIVE  Patient reports: Has been doing internal release 2 times since past visit and it is hard, is becoming tearful treated the "front two" the first time and the back muscles the second time. Has been having leakage with urge, feels it may be more often. Has noticed improved endurance of LLE with walking. She is frustrated with her "regression" because she knows that she will be needing to go back to work soon and she fears that she will have to wear depends.    Precautions:  9 weeks PP  Pain update:  Location of pain: none Current pain:  0/10  Max pain:  0/10  Least pain:  0/10 Nature of pain: none  Patient Goals: Control her bladder/not need depends.   OBJECTIVE  Changes in: Pelvic floor: TTP to all on R and to L IC, PR, and PC, decreased mobility to scar at 4:30 o'clock.  MMT: 1+/5 pre-treatment, 2+/5 following treatment.  Observation:  AMB in without walker today.   INTERVENTIONS THIS SESSION: Manual: Performed TP release internally to all major areas on the R and to L IC, PR, and PC as well as scar release to ~ 4:30 o'clock region at the posterior fourchette to decrease tension on nerves and allow for length/relaxation and improved ability to recruit PFM for kegels and strengthening. NM re-ed: Educated on how to recruit PFM including using visualization of picking blueberries as well as manual facilitation and fascial mobilization in an upward direction with timing of the exhale and squeeze.   Total time: 60 min.                            PT Short Term Goals - 07/16/19 1606      PT SHORT TERM GOAL #1   Title  Patient will demonstrate improved pelvic alignment and balance of musculature surrounding the pelvis to facilitate  decreased PFM spasms and decrease pressure on nerve roots to allow for optimal PFM function    Baseline  Has L foot-drop and pain in L glutes/knee as well as inability to sense PFM and L posterior innominate rotation/spasms surrounding pelvis.    Time  5    Period  Weeks    Status  New    Target Date  08/20/19      PT SHORT TERM GOAL #2   Title  Patient will demonstrate HEP x1 in the clinic to demonstrate understanding and proper form to allow for further improvement.    Baseline  Pt. lacks knowledge of therapeutic ecersise that will decrease Sx.    Time  5    Period  Weeks    Status  New    Target Date  08/20/19      PT SHORT TERM GOAL #3   Title  Pt. will be able to  walk 250 Ft.with CGA and w/o assistive device to demonstrate improved balance/decreased instability and foot drop.    Baseline  Pt. using RW to AMB due to L foot-drop and increased instability.    Time  5    Period  Weeks    Status  New    Target Date  08/20/19      PT SHORT TERM GOAL #4   Title  Patient will demonstrate a coordinated contraction, relaxation, and bulge of the pelvic floor muscles to demonstrate functional recruitment and motion and allow for further strengthening.    Baseline  Pt. reports inability to sense the PFM for kegel or relaxation, having mixed UI    Time  5    Period  Weeks    Status  New    Target Date  08/20/19        PT Long Term Goals - 07/16/19 1616      PT LONG TERM GOAL #1   Title  Patient will report no episodes of UUI/SUI over the course of the prior two weeks to demonstrate improved functional ability.    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    Baseline  having mild pain with initial penetration    Time  10    Period  Weeks    Status  New    Target Date  09/24/19      PT LONG TERM GOAL #3   Title  Patient will score at or below 22/100 on the UDI and 27/100 on the UIQ to demonstrate a clinically meaningful decrease in disability and distress due to pelvic floor dysfunction.    Baseline  UDI: 67, UIQ: 72/100    Time  10    Period  Weeks    Status  New    Target Date  09/24/19      PT LONG TERM GOAL #4   Title  Pt. 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    Baseline  Pt. requiring RW for AMB due to L foot-drop and imbalance.    Time  10    Period  Weeks  Status  New    Target Date  09/24/19             Plan - 09/10/19 1640    Clinical Impression Statement  Pt. Is making steady improvement with foot drop, likely cause for increased UUI is that there was improved length of PFM with TP release internally but scar tissue is restricting ROM and there has not yet been sufficient time to improve strength of the PFM. Following today's treatment she was demonstrating improved recruitment which allowed her to demonstrate a stronger squeeze though it remains slightly less than a "functional" amount of strength.    Personal Factors and Comorbidities  Time since onset of injury/illness/exacerbation    Examination-Activity Limitations  Locomotion Level;Stand;Stairs;Lift;Squat;Toileting;Continence;Sit;Transfers;Caring for Others;Bend    Examination-Participation Restrictions  Interpersonal Relationship;Yard Work;Community Activity;Meal Prep;Laundry    Stability/Clinical Decision Making  Evolving/Moderate complexity    PT Frequency  2x / week    PT Duration  Other (comment)   10 weeks   PT Treatment/Interventions  ADLs/Self Care Home Management;Aquatic Therapy;Biofeedback;Electrical Stimulation;Gait training;Stair training;Balance training;Therapeutic exercise;Therapeutic activities;Functional mobility training;Neuromuscular re-education;Patient/family education;Manual techniques;Scar mobilization;Dry needling;Taping;Spinal Manipulations;Joint Manipulations    PT Next Visit Plan  . re-measure leg-length. TP release vs. DN to, hip-flexors, and R pectineus. re-assess for Piriformis spasm (have performed DN and TP release now)    PT Home Exercise Plan  TA in mod-quad, Side-stretch, multi-directional ankle AROM, hip-flexor stretch and TP release, posterior pelvic tilt in seated, mini-marches, tea-pot, bow-and-arrow, side-plank, calf-stretch, scapular retraction, chin-tucks    Consulted and Agree with Plan of Care  Patient       Patient will benefit from skilled therapeutic intervention in order to  improve the following deficits and impairments:  Abnormal gait, Decreased balance, Decreased endurance, Difficulty walking, Increased muscle spasms, Impaired tone, Improper body mechanics, Decreased activity tolerance, Decreased coordination, Decreased strength, Hypermobility, Postural dysfunction, Pain  Visit Diagnosis: Foot drop, left  Other muscle spasm  Sacrococcygeal disorders, not elsewhere classified     Problem List Patient Active Problem List   Diagnosis Date Noted  . Urinary incontinence 08/17/2019  . Foot drop, left 08/17/2019  . Maternal varicella, non-immune 11/20/2018   Lisa Brady DPT, ATC Lisa Brady 09/11/2019, 8:07 AM  [place] MAIN [facility] SERVICES [address] [place], [place], [address] Phone: [phone]   Fax:  [phone]  Name: Lisa Brady MRN: [id number] Date of Birth: Jan 02, 1986

## 2019-09-11 ENCOUNTER — Ambulatory Visit: Payer: No Typology Code available for payment source

## 2019-09-11 LAB — URINALYSIS, COMPLETE
Bilirubin, UA: NEGATIVE
Glucose, UA: NEGATIVE
Ketones, UA: NEGATIVE
Nitrite, UA: NEGATIVE
Protein,UA: NEGATIVE
Specific Gravity, UA: 1.02 (ref 1.005–1.030)
Urobilinogen, Ur: 0.2 mg/dL (ref 0.2–1.0)
pH, UA: 8.5 — ABNORMAL HIGH (ref 5.0–7.5)

## 2019-09-11 LAB — MICROSCOPIC EXAMINATION
Bacteria, UA: NONE SEEN
RBC, Urine: NONE SEEN /hpf (ref 0–2)

## 2019-09-12 LAB — URINE CULTURE

## 2019-09-13 ENCOUNTER — Other Ambulatory Visit: Payer: Self-pay

## 2019-09-13 ENCOUNTER — Ambulatory Visit: Payer: No Typology Code available for payment source

## 2019-09-13 DIAGNOSIS — M62838 Other muscle spasm: Secondary | ICD-10-CM

## 2019-09-13 DIAGNOSIS — M21372 Foot drop, left foot: Secondary | ICD-10-CM

## 2019-09-13 DIAGNOSIS — M533 Sacrococcygeal disorders, not elsewhere classified: Secondary | ICD-10-CM

## 2019-09-13 NOTE — Therapy (Signed)
[place] MAIN [facility] SERVICES [address] [place], [place], [address] Phone: [phone]   Fax:  [phone]  Physical Therapy Treatment  The patient has been informed of current processes in place at Outpatient Rehab to protect patients from Covid-19 exposure including social distancing, schedule modifications, and new cleaning procedures. After discussing their particular risk with a therapist based on the patient's personal risk factors, the patient has decided to proceed with in-person therapy.   Patient Details  Name: Lisa Brady MRN: [id number] Date of Birth: August 25, 1986 Referring Provider (PT): [name]   Encounter Date: 09/13/2019  PT End of Session - 09/13/19 1734    Visit Number  12    Number of Visits  20    Date for PT Re-Evaluation  09/24/19    Authorization - Visit Number  12    Authorization - Number of Visits  20    PT Start Time  1300    PT Stop Time  1400    PT Time Calculation (min)  60 min    Activity Tolerance  Patient tolerated treatment well;No increased pain    Behavior During Therapy  WFL for tasks assessed/performed       Past Medical History:  Diagnosis Date  . Foot drop     Past Surgical History:  Procedure Laterality Date  . WISDOM TOOTH EXTRACTION  2006    There were no vitals filed for this visit.      Pelvic Floor Physical Therapy Treatment Note  SCREENING  Changes in medications, allergies, or medical history?: none   SUBJECTIVE  Patient reports: Patient had increased pelvic pain after speculum from Urologist. And has pelvic pain since then. Patient shares she has less frequency, SUI and more urge with accidents. Has been using tennis ball hip flexor release in the last few days. Patient was able to walk for a few hours on Wednesday, but needed to do a shorter walk this morning.   Precautions:  9 weeks PP  Pain update:  Location of pain:  Perineum/L hip/low back  Current  pain:  0/10  Max pain:  2-3/10  Least pain:  0/10 Nature of pain: deep ache   Patient Goals: Control her bladder/not need depends.   OBJECTIVE  Changes in: Pelvic floor: TTP to L posterior OI, PC posteriorly decreased mobility to scar at 4:30 o'clock. High sensitivity even with movement of finger to reposition.   Difficulty maintaining probe position due to weakness of PFM, requiring PT to prevent probe loss during biofeedback training.   *at end of biofeedback training Pt. Not expelling probe at rest. Able to demonstrate coordinated squeeze, relax, and bearing down.  MMT:2+/5   Observation:  AMB in without walker today.   INTERVENTIONS THIS SESSION: Manual: Performed TP release internally to all major areas on the R and to L IC, PR, and PC as well as scar release to ~ 4:30 o'clock region at the posterior fourchette to decrease tension on nerves and allow for length/relaxation and improved ability to recruit PFM for kegels and strengthening.  NM re-ed: Reviewed how to recruit PFM including using visualization of picking blueberries and using "clitoral nod" as well as visual feedback.  Biofeedback: With 1 channel connection (1 internal, 1 ground) patient performed contraction/relaxation of PFM coordination, followed by quick contract/relax with breathing coordination in order to facilitate increased neuromuscular awareness of PFM in order to have improved  SUI symptomology.    Total time: 60 min.  PT Short Term Goals - 07/16/19 1606      PT SHORT TERM GOAL #1   Title  Patient will demonstrate improved pelvic alignment and balance of musculature surrounding the pelvis to facilitate decreased PFM spasms and decrease pressure on nerve roots to allow for optimal PFM function    Baseline  Has L foot-drop and pain in L glutes/knee as well as inability to sense PFM and L posterior innominate rotation/spasms surrounding  pelvis.    Time  5    Period  Weeks    Status  New    Target Date  08/20/19      PT SHORT TERM GOAL #2   Title  Patient will demonstrate HEP x1 in the clinic to demonstrate understanding and proper form to allow for further improvement.    Baseline  Pt. lacks knowledge of therapeutic ecersise that will decrease Sx.    Time  5    Period  Weeks    Status  New    Target Date  08/20/19      PT SHORT TERM GOAL #3   Title  Pt. will be able to walk 250 Ft.with CGA and w/o assistive device to demonstrate improved balance/decreased instability and foot drop.    Baseline  Pt. using RW to AMB due to L foot-drop and increased instability.    Time  5    Period  Weeks    Status  New    Target Date  08/20/19      PT SHORT TERM GOAL #4   Title  Patient will demonstrate a coordinated contraction, relaxation, and bulge of the pelvic floor muscles to demonstrate functional recruitment and motion and allow for further strengthening.    Baseline  Pt. reports inability to sense the PFM for kegel or relaxation, having mixed UI    Time  5    Period  Weeks    Status  New    Target Date  08/20/19        PT Long Term Goals - 07/16/19 1616      PT LONG TERM GOAL #1   Title  Patient will report no episodes of UUI/SUI over the course of the prior two weeks to demonstrate improved functional ability.    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    Baseline  having mild pain with initial penetration    Time  10    Period  Weeks    Status  New    Target Date  09/24/19      PT LONG TERM GOAL #3   Title  Patient will score at or below 22/100 on the UDI and 27/100 on the UIQ to demonstrate a clinically meaningful decrease in disability and distress due to pelvic floor  dysfunction.    Baseline  UDI: 67, UIQ: 72/100    Time  10    Period  Weeks    Status  New    Target Date  09/24/19      PT LONG TERM GOAL #4   Title  Pt. will demonstrate appropriate gait mechanics and be able to perform ADL's without use of AD and without pain  to allow for child-care and return to work.    Baseline  Pt. requiring RW for AMB due to L foot-drop and imbalance.    Time  10    Period  Weeks    Status  New    Target Date  09/24/19            Plan - 09/13/19 1735    Clinical Impression Statement  Pt. Responded well to all interventions today, demonstrating increased scar tissue length, improved PFM recruitment, and improved PFM coordination as well as understanding and correct performance of all education and exercises provided today. They will continue to benefit from skilled physical therapy to work toward remaining goals and maximize function as well as decrease likelihood of symptom increase or recurrence.     Personal Factors and Comorbidities  Time since onset of injury/illness/exacerbation    Examination-Activity Limitations  Locomotion Level;Stand;Stairs;Lift;Squat;Toileting;Continence;Sit;Transfers;Caring for Others;Bend    Examination-Participation Restrictions  Interpersonal Relationship;Yard Work;Community Activity;Meal Prep;Laundry    Stability/Clinical Decision Making  Evolving/Moderate complexity    PT Frequency  2x / week    PT Duration  Other (comment)   10 weeks   PT Treatment/Interventions  ADLs/Self Care Home Management;Aquatic Therapy;Biofeedback;Electrical Stimulation;Gait training;Stair training;Balance training;Therapeutic exercise;Therapeutic activities;Functional mobility training;Neuromuscular re-education;Patient/family education;Manual techniques;Scar mobilization;Dry needling;Taping;Spinal Manipulations;Joint Manipulations    PT Next Visit Plan  . re-measure leg-length. TP release vs. DN to, hip-flexors, and R pectineus. re-assess for  Piriformis spasm (have performed DN and TP release now)    PT Home Exercise Plan  TA in mod-quad, Side-stretch, multi-directional ankle AROM, hip-flexor stretch and TP release, posterior pelvic tilt in seated, mini-marches, tea-pot, bow-and-arrow, side-plank, calf-stretch, scapular retraction, chin-tucks    Consulted and Agree with Plan of Care  Patient       Patient will benefit from skilled therapeutic intervention in order to improve the following deficits and impairments:  Abnormal gait, Decreased balance, Decreased endurance, Difficulty walking, Increased muscle spasms, Impaired tone, Improper body mechanics, Decreased activity tolerance, Decreased coordination, Decreased strength, Hypermobility, Postural dysfunction, Pain  Visit Diagnosis: Foot drop, left  Other muscle spasm  Sacrococcygeal disorders, not elsewhere classified     Problem List Patient Active Problem List   Diagnosis Date Noted  . Urinary incontinence 08/17/2019  . Foot drop, left 08/17/2019  . Maternal varicella, non-immune 11/20/2018   Willa Rough DPT, ATC Willa Rough 09/13/2019, 6:02 PM  [place] MAIN [facility] SERVICES [address] [place], [place], [address] Phone: [phone]   Fax:  [phone]  Name: Lisa Brady MRN: [id number] Date of Birth: [birth year]

## 2019-09-17 ENCOUNTER — Ambulatory Visit: Payer: No Typology Code available for payment source

## 2019-09-17 ENCOUNTER — Other Ambulatory Visit: Payer: Self-pay

## 2019-09-17 DIAGNOSIS — M62838 Other muscle spasm: Secondary | ICD-10-CM

## 2019-09-17 DIAGNOSIS — M21372 Foot drop, left foot: Secondary | ICD-10-CM

## 2019-09-17 DIAGNOSIS — M533 Sacrococcygeal disorders, not elsewhere classified: Secondary | ICD-10-CM

## 2019-09-17 NOTE — Therapy (Signed)
[place] MAIN [facility] SERVICES [address] [place], [place], [address] Phone: [phone]   Fax:  [phone]  Physical Therapy Treatment  The patient has been informed of current processes in place at Outpatient Rehab to protect patients from Covid-19 exposure including social distancing, schedule modifications, and new cleaning procedures. After discussing their particular risk with a therapist based on the patient's personal risk factors, the patient has decided to proceed with in-person therapy.   Patient Details  Name: Lisa Brady MRN: [id number] Date of Birth: [birth year] Referring Provider (PT): [name]   Encounter Date: 09/17/2019  PT End of Session - 09/17/19 1647    Visit Number  13    Number of Visits  20    Date for PT Re-Evaluation  09/24/19    Authorization - Visit Number  13    Authorization - Number of Visits  20    PT Start Time  [id number]    PT Stop Time  1330    PT Time Calculation (min)  60 min    Activity Tolerance  Patient tolerated treatment well;No increased pain    Behavior During Therapy  WFL for tasks assessed/performed       Past Medical History:  Diagnosis Date  . Foot drop     Past Surgical History:  Procedure Laterality Date  . WISDOM TOOTH EXTRACTION  2006    There were no vitals filed for this visit.      Pelvic Floor Physical Therapy Treatment Note  SCREENING  Changes in medications, allergies, or medical history?: none   SUBJECTIVE  Patient reports: Has been "spotting" for ~ 1 week now so she is not sure if she is "leaking" but has had some with sneeze/cough and no "accidents" since last visit. Urgency x1/day. Walked at [employer] and [employer] but felt more stable with shopping cord. Only has red resistance band not yellow. Is holding ~ 4-6 seconds for long-holds but feels like quick-flicks are weak.   Precautions:  10 weeks PP  Pain update:  Location of pain:  Perineum/L hip/low  back  Current pain:  0/10  Max pain:  3-4/10 (perenium) 1-2 in back  Least pain:  0/10 Nature of pain: deep ache   Patient Goals: Control her bladder/not need depends.   OBJECTIVE  Changes in: Pelvic floor: Granulation tissue noted at ~5 o'clock in introitus MMT:2+/5   Observation:  AMB in without walker today. Decreased hip-flexor compensation and improved foot-clearing  Palpation: TTP to B adductors, sufficient fascial mobility through adductors.    INTERVENTIONS THIS SESSION: Manual: re-assessed PFM strength and adductor fascia mobility for POC development  Therex: Educated on how to perform adductor TP release using foam roller and adductor stretch in supine to decrease spasm and pain and allow for improved balance of musculature for improved function and decreased symptoms. Reviewed HEP and discussed plan for progression and development of long-term exercise program to stabilize spine and minimize scoliotic curve for overall health.    Total time: 60 min.                         PT Short Term Goals - 09/17/19 1649      PT SHORT TERM GOAL #1   Title  Patient will demonstrate improved pelvic alignment and balance of musculature surrounding the pelvis to facilitate decreased PFM spasms and decrease pressure on nerve roots to allow for optimal PFM function    Baseline  Has  L foot-drop and pain in L glutes/knee as well as inability to sense PFM and L posterior innominate rotation/spasms surrounding pelvis.    Time  5    Period  Weeks    Status  On-going    Target Date  11/26/19      PT SHORT TERM GOAL #2   Title  Patient will demonstrate HEP x1 in the clinic to demonstrate understanding and proper form to allow for further improvement.    Baseline  Pt. lacks knowledge of therapeutic ecersise that will decrease Sx.    Time  5    Period  Weeks    Status  Achieved    Target Date  11/26/19      PT SHORT TERM GOAL #3   Title  Pt. will be able to  walk 250 Ft.with CGA and w/o assistive device to demonstrate improved balance/decreased instability and foot drop.    Baseline  Pt. using RW to AMB due to L foot-drop and increased instability.    Time  5    Period  Weeks    Status  Achieved    Target Date  08/20/19      PT SHORT TERM GOAL #4   Title  Patient will demonstrate a coordinated contraction, relaxation, and bulge of the pelvic floor muscles to demonstrate functional recruitment and motion and allow for further strengthening.    Baseline  Pt. reports inability to sense the PFM for kegel or relaxation, having mixed UI    Time  5    Period  Weeks    Status  Achieved    Target Date  08/20/19        PT Long Term Goals - 09/17/19 1651      PT LONG TERM GOAL #1   Title  Patient will report no episodes of UUI/SUI over the course of the prior two weeks to demonstrate improved functional ability.    Baseline  Having UUI at toiletside, h/o mild SUI before and during pregnancy    Time  10    Period  Weeks    Status  On-going    Target Date  11/26/19      PT LONG TERM GOAL #2   Title  Patient will report no pain with intercourse to demonstrate improved functional ability.    Baseline  having mild pain with initial penetration    Time  10    Period  Weeks    Status  Unable to assess    Target Date  11/26/19      PT LONG TERM GOAL #3   Title  Patient will score at or below 22/100 on the UDI and 27/100 on the UIQ to demonstrate a clinically meaningful decrease in disability and distress due to pelvic floor dysfunction.    Baseline  UDI: 67, UIQ: 72/100    Time  10    Period  Weeks    Status  On-going    Target Date  11/26/19      PT LONG TERM GOAL #4   Title  Pt. 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    Baseline  Pt. requiring RW for AMB due to L foot-drop and imbalance. As of 10/12: Pt. requiring some support  (e.g. grocery cart) for > 20 min. of AMB or stairs.    Time  10    Period  Weeks  Status  On-going    Target Date  11/26/19              Patient will benefit from skilled therapeutic intervention in order to improve the following deficits and impairments:     Visit Diagnosis: Foot drop, left  Other muscle spasm  Sacrococcygeal disorders, not elsewhere classified     Problem List Patient Active Problem List   Diagnosis Date Noted  . Urinary incontinence 08/17/2019  . Foot drop, left 08/17/2019  . Maternal varicella, non-immune 11/20/2018    Willa Rough 09/18/2019, 11:20 PM  [place] MAIN [facility] SERVICES [address] [place], [place], [address] Phone: [phone]   Fax:  [phone]  Name: Lisa Brady MRN: [id number] Date of Birth: [birth year]

## 2019-09-17 NOTE — Patient Instructions (Signed)
     Find the tender spot and breathe for 5 deep breaths x3 ~ 1x/day if tender spots present.    Bring feet together and let the knees fall out to the sides. Hold for 5 deep breaths, rest then repeat 2 more times.

## 2019-09-18 ENCOUNTER — Ambulatory Visit: Payer: No Typology Code available for payment source

## 2019-09-25 ENCOUNTER — Ambulatory Visit: Payer: No Typology Code available for payment source

## 2019-09-25 ENCOUNTER — Other Ambulatory Visit: Payer: Self-pay

## 2019-09-25 DIAGNOSIS — M21372 Foot drop, left foot: Secondary | ICD-10-CM | POA: Diagnosis not present

## 2019-09-25 DIAGNOSIS — M533 Sacrococcygeal disorders, not elsewhere classified: Secondary | ICD-10-CM

## 2019-09-25 DIAGNOSIS — M62838 Other muscle spasm: Secondary | ICD-10-CM

## 2019-09-25 NOTE — Patient Instructions (Signed)
Day 1  Day 2  Day 3  Day 4  Day 5   - Standing calf stretch  - Hip flexor stretch (half kneel)  - Seated hamstring stretch  - *Tea pot    - Butterfly stretch  - Figure 4 seated stretch - Bow and arrow  - Tea pot    - Standing calf stretch  - Hip flexor stretch (half kneel)  - Seated hamstring stretch  - Seated rotation  - Butterfly stretch  - Figure 4 seated stretch - Bow and arrow  - Tea pot   - Standing calf stretch  - Hip flexor stretch (half kneel)  - Seated hamstring stretch  - Butterfly stretch  - Tea pot   Core exercise  - Deep core level 1  Core exercise  - Knee planks (front)  Core exercise  - *Side planks  Core exercise  - Deep core level 1 Core exercise  - Front planks   LE engagement  - Heel raises  LE engagement  - Single leg stance  LE engagement  - Heel raises  LE engagement  - Single leg stance  LE engagement  - Heel raises   Functional  - Squat  Functional -  Bridges  Functional - Clam shells  Functional - Squat  Functional - Bridges   -     *perform every day if possible         Standing Calf Stretch  . 2 sets of 10 diaphragmatic breaths  . Goal increase length in gastric and soleus  . Bend back leg slightly for soleus engagement  . Best during walks or after    Half kneel Hip Flexor Stretch  . 2 sets of 10 diaphragmatic breaths  . Goal- increase length in hip flexors, reduce anterior pelvic tilt.  Marland Kitchen Keep pelvis level . Use a pillow for support on your knees.  . If this position is aggravating perform standing quad stretch.      Seated Hamstring Stretch  . 2 sets of 10 diaphragmatic breaths  . Goal- decrease tension along posterior line  Lean forward from hips, not back  . Point toes towards your nose, moving the ankle slowly back and forth (like a gas pedal)    Butterfly stretch  . Lay down in a comfortable position  . 2 sets of 10 diaphragmatic breaths- longer as it feels gentle stretch, less if painful.  . Put pillows  under your knees to assist with relaxation within pain free range  . Put pillow under tailbone/sacrum to assist with pain free range.  . Progression- performance in sitting- place yoga block under sacrum to assist with reducing posterior pelvic tilt.   Figure 4 stretch  . Seated- throughout the workday for piriformis relaxation  . 2 sets of 10 diaphragmatic breaths  Start seated for intermittent stretching, progressive difficulty is second photo.         Environmental health practitioner  . 2 sets of 10 reps, 1-2 diaphragmatic breaths for each "bow".  . Look towards your moving elbow.    Seated thoracic rotation  . Can be performed throughout the work day  . Hold for each side approx. 5 diaphragmatic breaths, perform an extra set to the tighter side.      Deep Core Level 1  - Lay with knees bent, as you breath in, perform small anterior pelvic tilt, as you breath out, small posterior pelvic tilt. Perform for 2 sets of 10 diaphragmatic breaths- watch for rib  excursion and not belly.  -    Deep Core Level 2 (once level 1 becomes easier)  - Lay with knees bent, as you breath in for 2 seconds, then breath out 2 seconds, at end of exhalation bring one knee out approx. 10 degrees. Perform for up to 6 minutes.  - Watch to make sure contralateral hips do not engage. (keep core engagement)  - No yellow band.        Front Plank  - Goal- to hold for 1 minutes 30 seconds, split throughout the day  - Start on knees, progress to knees up, then progress to forearms on the ground.  -    Side Plank  - Goal hold for 1 minutes 30 seconds, split throughout the day.  - Start on knees, progress to knees up, then progress to forearms on the ground.     Heel Raises  - Progression 1- start standing on both legs with upper extremity support as needed.  - Progression 2- start standing on one leg, and raising heel up with upper extremity support as needed.  - Goal- 2 sets of 15. (start only  at reps  that you are capable) -  -   Single leg stance  - Progression 1- start by using upper extremity support (like at a counter).  o **watch to make sure pelvis stays level  - Progression 2- slowly remove upper extremity support  - Progression 3- perform on pillow or in the grass (unstable surface)  - Progression 4- adding mini squat.  - Goal- 2 sets for 30 seconds  -  -   Squat  - Progression 1- mini squat; exhale while fighting gravity  - Progression 2- get up from chair  - Progression 3- chair height without touching the chair with your bottom  - Progression 4- adding lifting object from the ground (keep object close to chest)  - Goal 2 sets of 15  -  -    Bridges  - Progression 1- regular bridge (only go within pain free range)  - Progression 2- full hip extension  - Progression 3 - single leg bridges (watch to make sure hips stay even)  - Goals 2 sets of 15  -  -   Clam Shells - Progression 1- bilateral clam shells no resistance  - Progression 2- bilateral, with yellow resistance  - Progression 3- bilateral, with thicker resistance  - Goal 2 sets of 15  o No hip rotation (hips stay stacked)         Bowel Movement Assistance  - Manual assistance: Place thumb facing posterior towards rectum in vaginal canal to assist bowel movement out. Continue use of squatty potty.    Vulva healing:  - Use of coconut oil and vitamin E oil may assist in quality healing time.

## 2019-09-25 NOTE — Therapy (Signed)
[place] MAIN [facility] SERVICES [address] [place], [place], [address] Phone: [phone]   Fax:  [phone]  Physical Therapy Treatment  The patient has been informed of current processes in place at Outpatient Rehab to protect patients from Covid-19 exposure including social distancing, schedule modifications, and new cleaning procedures. After discussing their particular risk with a therapist based on the patient's personal risk factors, the patient has decided to proceed with in-person therapy.   Patient Details  Name: Lisa Brady MRN: [id number] Date of Birth: 1986/07/19 Referring Provider (PT): [name]   Encounter Date: 09/25/2019  PT End of Session - 09/25/19 0932    Visit Number  14    Number of Visits  20    Date for PT Re-Evaluation  09/24/19    Authorization Type  [place]    Authorization - Visit Number  14    Authorization - Number of Visits  20    PT Start Time  0830    PT Stop Time  0930    PT Time Calculation (min)  60 min    Activity Tolerance  Patient tolerated treatment well;No increased pain    Behavior During Therapy  WFL for tasks assessed/performed       Past Medical History:  Diagnosis Date  . Foot drop     Past Surgical History:  Procedure Laterality Date  . WISDOM TOOTH EXTRACTION  2006    There were no vitals filed for this visit.      Pelvic Floor Physical Therapy Treatment Note  SCREENING  Changes in medications, allergies, or medical history?: none   SUBJECTIVE  Patient reports: Patient would like to be reassessed for pelvic tilts exercise for reduction of urge incontinence. Has had less urgency in the last week- has been able to change diapers without leakage this week. However, patient described that starting a hike- she said she had "a lot of leakage" at the beginning of the hike and had to stop. Intermittent urgency still - described at while brushing her teeth. Delayed  start work date by one week from today. Patient described less fear with work duties.   Urge suppression techniques-- standing posterior pelvic tilt; posture corrections; Kegel - "does not seem to be affective".   Patient practiced sensor feedback at home- has more success since last session.   Precautions:  11 weeks PP  Pain update:  Location of pain:  Perineum/L hip/low back  Current pain:  0/10  Max pain:  3-4/10 (perenium) 1-2 in back  Least pain:  0/10 Nature of pain: deep ache   Patient Goals: Control her bladder/not need depends.   OBJECTIVE  Changes in: Pelvic floor: Granulation tissue noted at ~5 o'clock in introitus MMT:2+-3/5 (fatigues quickly)  -- 4 second PFM internal contraction hold, consistently.   Observation:  AMB in without walker today. Decreased hip-flexor compensation and improved foot-clearing.   Palpation: TTP to R>L    INTERVENTIONS THIS SESSION: Self care: Urge suppression techniques- 2 deep breaths, and 5 quick flicks of Kegel. With hiking- positional incontinence- edu on correcting pelvis during hiking to reduce incontinence. Handout given for HEP.  (8 minutes)   Manual: Reassess functional PFM internal contraction and training on urge suppression training with feedback. B hip adductor trigger point release ecrease spasm and pain and allow for improved balance of musculature for improved function and decreased symptoms. L QL release, with internal/external oblique fascial work in side-lying to promote TA activation. Assessment of pelvic  alignment and LLD- decreased abnormalities following treatment.  (23 minutes)  ThereAct: Patient education and performance of HEP with given days and progressions/regressions as patient prepares to decrease in therapy time with return to work requirements. Patient performed seated piriformis stretch, B. Deep core level 1, and updated diaphragmatic breathing education.  (33 minutes)   Total time: 63  min.                                  PT Short Term Goals - 09/17/19 1649      PT SHORT TERM GOAL #1   Title  Patient will demonstrate improved pelvic alignment and balance of musculature surrounding the pelvis to facilitate decreased PFM spasms and decrease pressure on nerve roots to allow for optimal PFM function    Baseline  Has L foot-drop and pain in L glutes/knee as well as inability to sense PFM and L posterior innominate rotation/spasms surrounding pelvis.    Time  5    Period  Weeks    Status  On-going    Target Date  11/26/19      PT SHORT TERM GOAL #2   Title  Patient will demonstrate HEP x1 in the clinic to demonstrate understanding and proper form to allow for further improvement.    Baseline  Pt. lacks knowledge of therapeutic ecersise that will decrease Sx.    Time  5    Period  Weeks    Status  Achieved    Target Date  11/26/19      PT SHORT TERM GOAL #3   Title  Pt. will be able to walk 250 Ft.with CGA and w/o assistive device to demonstrate improved balance/decreased instability and foot drop.    Baseline  Pt. using RW to AMB due to L foot-drop and increased instability.    Time  5    Period  Weeks    Status  Achieved    Target Date  08/20/19      PT SHORT TERM GOAL #4   Title  Patient will demonstrate a coordinated contraction, relaxation, and bulge of the pelvic floor muscles to demonstrate functional recruitment and motion and allow for further strengthening.    Baseline  Pt. reports inability to sense the PFM for kegel or relaxation, having mixed UI    Time  5    Period  Weeks    Status  Achieved    Target Date  08/20/19        PT Long Term Goals - 09/17/19 1651      PT LONG TERM GOAL #1   Title  Patient will report no episodes of UUI/SUI over the course of the prior two weeks to demonstrate improved functional ability.    Baseline  Having UUI at toiletside, h/o mild SUI before and during pregnancy    Time  10     Period  Weeks    Status  On-going    Target Date  11/26/19      PT LONG TERM GOAL #2   Title  Patient will report no pain with intercourse to demonstrate improved functional ability.    Baseline  having mild pain with initial penetration    Time  10    Period  Weeks    Status  Unable to assess    Target Date  11/26/19      PT LONG TERM GOAL #3   Title  Patient will  score at or below 22/100 on the UDI and 27/100 on the UIQ to demonstrate a clinically meaningful decrease in disability and distress due to pelvic floor dysfunction.    Baseline  UDI: 67, UIQ: 72/100    Time  10    Period  Weeks    Status  On-going    Target Date  11/26/19      PT LONG TERM GOAL #4   Title  Pt. 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    Baseline  Pt. requiring RW for AMB due to L foot-drop and imbalance. As of 10/12: Pt. requiring some support (e.g. grocery cart) for > 20 min. of AMB or stairs.    Time  10    Period  Weeks    Status  On-going    Target Date  11/26/19            Plan - 09/25/19 0932    Clinical Impression Statement  Pt arrived to PT with less pelvic obliquities this session. (L was slightly longer than R in LLD visual measure). With L QL release and ER/IR oblique facial work for TA coordination in side-lying, patient demonstrated improved alignment and less low back pain.   Patient demonstrated decreased coordination of standing/sitting pelvic tilts with breathing, which may be contributing to incorrect use of urge suppression techniques. Improved pelvic coordinations with verbal and tactile feedback performed this session, reinforced with Deep Core Level 1 Exercises (see patient handout). Decreased pelvic coordination could be contributing to incontinence promoting posture while hiking.    Patient demonstrated good carry over of PFM coordination and contraction since  previous session, demonstrating 2+-3/5.  Patient will continue to benefit from skilled pelvic health PT to address goals, mixed incontinence, and improve QOL.    Personal Factors and Comorbidities  Time since onset of injury/illness/exacerbation    Examination-Activity Limitations  Locomotion Level;Stand;Stairs;Lift;Squat;Toileting;Continence;Sit;Transfers;Caring for Others;Bend    Examination-Participation Restrictions  Interpersonal Relationship;Yard Work;Community Activity;Meal Prep;Laundry    Stability/Clinical Decision Making  Evolving/Moderate complexity    PT Frequency  2x / week    PT Duration  Other (comment)   10 weeks   PT Treatment/Interventions  ADLs/Self Care Home Management;Aquatic Therapy;Biofeedback;Electrical Stimulation;Gait training;Stair training;Balance training;Therapeutic exercise;Therapeutic activities;Functional mobility training;Neuromuscular re-education;Patient/family education;Manual techniques;Scar mobilization;Dry needling;Taping;Spinal Manipulations;Joint Manipulations    PT Next Visit Plan  re-assess pelvic alignment and re-measure leg-length when aligned. TP release vs. DN to, hip-flexors, and R pectineus. re-assess for Piriformis spasm (have performed DN and TP release now) give long-term HEP    PT Home Exercise Plan  TA in mod-quad, Side-stretch, multi-directional ankle AROM, hip-flexor stretch and TP release, posterior pelvic tilt in seated, mini-marches, tea-pot, bow-and-arrow, side-plank, calf-stretch, scapular retraction, chin-tucks    Consulted and Agree with Plan of Care  Patient       Patient will benefit from skilled therapeutic intervention in order to improve the following deficits and impairments:  Abnormal gait, Decreased balance, Decreased endurance, Difficulty walking, Increased muscle spasms, Impaired tone, Improper body mechanics, Decreased activity tolerance, Decreased coordination, Decreased strength, Hypermobility, Postural dysfunction,  Pain  Visit Diagnosis: Foot drop, left  Other muscle spasm  Sacrococcygeal disorders, not elsewhere classified     Problem List Patient Active Problem List   Diagnosis Date Noted  . Urinary incontinence 08/17/2019  . Foot drop, left 08/17/2019  . Maternal varicella, non-immune 11/20/2018    Isaac Dubie, SPT  09/25/2019, 11:10 AM   This entire session was performed under direct supervision and direction of a licensed therapist/therapist assistant . I have personally read, edited and approve of the note as written.  Willa Rough DPT, [place] MAIN [facility] SERVICES [address] [place], [place], [address] Phone: [phone]   Fax:  [phone]  Name: Lisa Brady MRN: [id number] Date of Birth: 08-Oct-1986

## 2019-10-05 ENCOUNTER — Other Ambulatory Visit: Payer: Self-pay

## 2019-10-05 ENCOUNTER — Ambulatory Visit: Payer: No Typology Code available for payment source

## 2019-10-05 DIAGNOSIS — M62838 Other muscle spasm: Secondary | ICD-10-CM

## 2019-10-05 DIAGNOSIS — M21372 Foot drop, left foot: Secondary | ICD-10-CM

## 2019-10-05 DIAGNOSIS — M533 Sacrococcygeal disorders, not elsewhere classified: Secondary | ICD-10-CM

## 2019-10-05 NOTE — Patient Instructions (Signed)
"  Pre-squeeze and sneeze"  Before you cough, sneeze, laugh etc. "pre-squeeze" the pelvic floor (kegel) and hold it until you finish coughing to retrain the muscles to hold the urine in during these activities.    Triggers for Urge Suppression  - Try doing deep breathing and 5 "quick" flicks while in the kitchen.     Posture Correction: Mid Shoulder strengthening  2 sets of 8 with red theraband  1. "set" the shoulders "down and back"  2. Hands out in front of you, on your next exhale bring hands out away from you, drawing shoulders blades together, without raises shoulders up. (use a mirror to help cue you)  * always have feet firmly planted on the ground.   (add to HEP perform 2-3x a week)   Pelvic Tilt With Pelvic Floor (Hook-Lying)        Lie with hips and knees bent. Squeeze pelvic floor and flatten low back while breathing out so that pelvis tilts. Repeat _10__ times. Do __2_ times a day. **On exhale try to perform Kegel. It is ok to perform just belly breathing with kegel on exhale. And then perform pelvic tilts separately.   *Put a pillow under your hips in this position when doing this exercise to help decrease pressure in the pelvis when it feels "heavy".

## 2019-10-05 NOTE — Therapy (Signed)
[name] MAIN [facility] SERVICES [address] [place], [place], [address] Phone: [phone]   Fax:  [phone]  Physical Therapy Treatment The patient has been informed of current processes in place at Outpatient Rehab to protect patients from Covid-19 exposure including social distancing, schedule modifications, and new cleaning procedures. After discussing their particular risk with a therapist based on the patient's personal risk factors, the patient has decided to proceed with in-person therapy.  Patient Details  Name: Lisa Brady MRN: [id number] Date of Birth: 04/11/1986 Referring Provider (PT): [name]   Encounter Date: 10/05/2019  PT End of Session - 10/05/19 1010    Visit Number  15    Number of Visits  23    Date for PT Re-Evaluation  09/24/19    Authorization Type  [place]    Authorization - Visit Number  2    Authorization - Number of Visits  10    PT Start Time  [phone]    PT Stop Time  1005    PT Time Calculation (min)  60 min    Activity Tolerance  Patient tolerated treatment well;No increased pain    Behavior During Therapy  WFL for tasks assessed/performed       Past Medical History:  Diagnosis Date  . Foot drop     Past Surgical History:  Procedure Laterality Date  . WISDOM TOOTH EXTRACTION  2006    There were no vitals filed for this visit.     Pelvic Floor Physical Therapy Treatment Note  SCREENING  Changes in medications, allergies, or medical history?: none   SUBJECTIVE  Patient reports: Started back at work last Tuesday. Feeling "okay". No urinary urgency while at work initially- and it returns when she comes home. L side/hip feels triggered following PT last week. Has been able to use posture belt- had some upper back tenderness. Feels very fatigued trying to attempt Kegels- with practice with urge suppression techniques. No accidents this week. More unclear due to menstrual flow.  Sneezing- 1-2 drops this week. Triggers at home- mainly in the kitchen.     Precautions:  13 weeks PP  Pain update:  Location of pain:  L hip/low back  Current pain:  1-2/10  Max pain:  4/10 NPS intermittently  Least pain:  0/10 Nature of pain: deep ache   Patient Goals: Control her bladder/not need depends.   OBJECTIVE  Changes in: Pelvic floor: N/A  Observation:   Decreased hip-flexor compensation and improved foot-clearing.   Palpation:  TTP B rhomboids, upper trap, teres major/minor  TTP to cervical extensors, levator spinae     INTERVENTIONS THIS SESSION: Self care: Urge suppression techniques for while at home, fine tuning when the urge occurs to improve QOL. Techniques for urge suppression while in the kitchen. Postural support brace (flexible) performance while doing kitchen activities prior to adding postural brace at work. Brief review of long term HEP for understanding.   (8 minutes)   Manual: Thoracolumbar MWM (open book), bilaterally with diaphragmatic breathing and cues for shoulder depression/retraction. TP release at B mid scapular musculature to improve scapular mobility, in preparation for stability TP release along cervical extensors and upper trap/levaor spinae, followed by gentle (grade I/II) manual cervical distraction. Patient had decreased tension following manual treatment this session. (22 minutes)  ThereEx: Diaphragmatic breathing with pillow under hips, with and without pelvic tilts, with and without Kegel performance on exhale. Patient required verbal cues for sequencing. (Pt education on reducing prolapse  symptoms during the day with technique). Rhombi/scapular stabilization with red t-band performed and added to HEP. Tactile cues for decreasing scapular elevation and improving retraction. (30 minutes)   Total time: 60 min.                                      PT Short Term Goals - 09/17/19 1649      PT  SHORT TERM GOAL #1   Title  Patient will demonstrate improved pelvic alignment and balance of musculature surrounding the pelvis to facilitate decreased PFM spasms and decrease pressure on nerve roots to allow for optimal PFM function    Baseline  Has L foot-drop and pain in L glutes/knee as well as inability to sense PFM and L posterior innominate rotation/spasms surrounding pelvis.    Time  5    Period  Weeks    Status  On-going    Target Date  11/26/19      PT SHORT TERM GOAL #2   Title  Patient will demonstrate HEP x1 in the clinic to demonstrate understanding and proper form to allow for further improvement.    Baseline  Pt. lacks knowledge of therapeutic ecersise that will decrease Sx.    Time  5    Period  Weeks    Status  Achieved    Target Date  11/26/19      PT SHORT TERM GOAL #3   Title  Pt. will be able to walk 250 Ft.with CGA and w/o assistive device to demonstrate improved balance/decreased instability and foot drop.    Baseline  Pt. using RW to AMB due to L foot-drop and increased instability.    Time  5    Period  Weeks    Status  Achieved    Target Date  08/20/19      PT SHORT TERM GOAL #4   Title  Patient will demonstrate a coordinated contraction, relaxation, and bulge of the pelvic floor muscles to demonstrate functional recruitment and motion and allow for further strengthening.    Baseline  Pt. reports inability to sense the PFM for kegel or relaxation, having mixed UI    Time  5    Period  Weeks    Status  Achieved    Target Date  08/20/19        PT Long Term Goals - 09/17/19 1651      PT LONG TERM GOAL #1   Title  Patient will report no episodes of UUI/SUI over the course of the prior two weeks to demonstrate improved functional ability.    Baseline  Having UUI at toiletside, h/o mild SUI before and during pregnancy    Time  10    Period  Weeks    Status  On-going    Target Date  11/26/19      PT LONG TERM GOAL #2   Title  Patient will report no  pain with intercourse to demonstrate improved functional ability.    Baseline  having mild pain with initial penetration    Time  10    Period  Weeks    Status  Unable to assess    Target Date  11/26/19      PT LONG TERM GOAL #3   Title  Patient will score at or below 22/100 on the UDI and 27/100 on the UIQ to demonstrate a clinically meaningful decrease in disability and distress  due to pelvic floor dysfunction.    Baseline  UDI: 67, UIQ: 72/100    Time  10    Period  Weeks    Status  On-going    Target Date  11/26/19      PT LONG TERM GOAL #4   Title  Pt. 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    Baseline  Pt. requiring RW for AMB due to L foot-drop and imbalance. As of 10/12: Pt. requiring some support (e.g. grocery cart) for > 20 min. of AMB or stairs.    Time  10    Period  Weeks    Status  On-going    Target Date  11/26/19            Plan - 10/05/19 1010    Clinical Impression Statement  Pt. Responded well to all interventions today, demonstrating improved scapular stability, improved thoracic mobility, and improved pelvic coordination in hook-lying as well as understanding and correct performance of all education and exercises provided today. They will continue to benefit from skilled physical therapy to work toward remaining goals and maximize function as well as decrease likelihood of symptom increase or recurrence.    Personal Factors and Comorbidities  Time since onset of injury/illness/exacerbation    Examination-Activity Limitations  Locomotion Level;Stand;Stairs;Lift;Squat;Toileting;Continence;Sit;Transfers;Caring for Others;Bend    Examination-Participation Restrictions  Interpersonal Relationship;Yard Work;Community Activity;Meal Prep;Laundry    Stability/Clinical Decision Making  Evolving/Moderate complexity    PT Frequency  2x / week    PT Duration   Other (comment)   10 weeks   PT Treatment/Interventions  ADLs/Self Care Home Management;Aquatic Therapy;Biofeedback;Electrical Stimulation;Gait training;Stair training;Balance training;Therapeutic exercise;Therapeutic activities;Functional mobility training;Neuromuscular re-education;Patient/family education;Manual techniques;Scar mobilization;Dry needling;Taping;Spinal Manipulations;Joint Manipulations    PT Next Visit Plan  reasssess thoracic mobility with scapular stability; TP release vs. DN to, hip-flexors, and R pectineus. re-assess for Piriformis spasm (have performed DN and TP release now); check in with long term HEP performance    PT Home Exercise Plan  thomboid stabilization; TA in mod-quad, Side-stretch, multi-directional ankle AROM, hip-flexor stretch and TP release, posterior pelvic tilt in seated, mini-marches, tea-pot, bow-and-arrow, side-plank, calf-stretch, scapular retraction, chin-tucks    Consulted and Agree with Plan of Care  Patient       Patient will benefit from skilled therapeutic intervention in order to improve the following deficits and impairments:  Abnormal gait, Decreased balance, Decreased endurance, Difficulty walking, Increased muscle spasms, Impaired tone, Improper body mechanics, Decreased activity tolerance, Decreased coordination, Decreased strength, Hypermobility, Postural dysfunction, Pain  Visit Diagnosis: Other muscle spasm  Foot drop, left  Sacrococcygeal disorders, not elsewhere classified     Problem List Patient Active Problem List   Diagnosis Date Noted  . Urinary incontinence 08/17/2019  . Foot drop, left 08/17/2019  . Maternal varicella, non-immune 11/20/2018    Tomasita Morrow, SPT  10/05/2019, 10:42 AM  [place] MAIN [facility] SERVICES [address] [place], [place], [address] Phone: [phone]   Fax:  [phone]  Name: Luisana Trumbull MRN: [id number] Date of Birth: [birth year]

## 2019-10-08 ENCOUNTER — Other Ambulatory Visit: Payer: Self-pay

## 2019-10-08 MED ORDER — NORETHINDRONE ACET-ETHINYL EST 1.5-30 MG-MCG PO TABS: 1.0000 | ORAL_TABLET | Freq: Every day | ORAL | 11 refills | Status: DC

## 2019-10-08 NOTE — Telephone Encounter (Signed)
Refill sent per AT. Mychart message sent to patient.

## 2019-10-12 ENCOUNTER — Other Ambulatory Visit: Payer: Self-pay

## 2019-10-12 ENCOUNTER — Ambulatory Visit: Payer: No Typology Code available for payment source | Attending: Obstetrics and Gynecology

## 2019-10-12 DIAGNOSIS — M533 Sacrococcygeal disorders, not elsewhere classified: Secondary | ICD-10-CM | POA: Insufficient documentation

## 2019-10-12 DIAGNOSIS — M21372 Foot drop, left foot: Secondary | ICD-10-CM | POA: Diagnosis present

## 2019-10-12 DIAGNOSIS — M62838 Other muscle spasm: Secondary | ICD-10-CM | POA: Diagnosis present

## 2019-10-12 NOTE — Patient Instructions (Addendum)
Kegel training tips:  -  Try propped up sitting during Kegel training  -  Posterior pelvic tilt  (tucking tailbone under) while going to the bathroom or when trying to prevent leakage.  - Using a mirror to look at muscle contraction of pelvic floor muscles.  - Have husband try to do the external muscles this week as well.  - increase trigger point release to at least 1x a week (every 2nd to 3rd day) for pelvic floor muscle tension  - perform trigger point release (tennis ball) on front of thigh (hip flexors) and R QL (side) for pain management.   Add red theraband for ankle exercises.

## 2019-10-12 NOTE — Therapy (Signed)
[place] MAIN [facility] SERVICES [address] [place], [place], [address] Phone: [phone]   Fax:  [phone]  Physical Therapy Treatment The patient has been informed of current processes in place at Outpatient Rehab to protect patients from Covid-19 exposure including social distancing, schedule modifications, and new cleaning procedures. After discussing their particular risk with a therapist based on the patient's personal risk factors, the patient has decided to proceed with in-person therapy.  Patient Details  Name: Lisa Brady MRN: [id number] Date of Birth: 01/10/1986 Referring Provider (PT): [name]   Encounter Date: 10/12/2019  PT End of Session - 10/12/19 0939    Visit Number  16    Number of Visits  23    Date for PT Re-Evaluation  09/24/19    Authorization Type  [place]    Authorization - Visit Number  3    Authorization - Number of Visits  10    PT Start Time  0900    PT Stop Time  1000    PT Time Calculation (min)  60 min    Activity Tolerance  Patient tolerated treatment well;No increased pain    Behavior During Therapy  WFL for tasks assessed/performed       Past Medical History:  Diagnosis Date  . Foot drop     Past Surgical History:  Procedure Laterality Date  . WISDOM TOOTH EXTRACTION  2006    There were no vitals filed for this visit.    Pelvic Floor Physical Therapy Treatment Note  SCREENING  Changes in medications, allergies, or medical history?: none   SUBJECTIVE  Patient reports: Work has been more slow this week. A little bit of urgency with work yesterday. Possibly due to more hydration habits. Still discouraged about strength of Kegel. Had tried to use biofeedback with in sitting and the device felt "very uncomfortable". Trigger point release- self has been difficult and occurring less often. Has attempted kitchen urge suppression this week.    Precautions:  14 weeks PP  Pain  update:  Location of pain:  L hip/low back  Current pain:  1-2/10  Max pain:  4/10 NPS intermittently  Least pain:  0/10 Nature of pain: deep ache   Patient Goals: Control her bladder/not need depends.   OBJECTIVE  Changes in: Pelvic floor: MMT 2+/5  TTP at B OI, levator spasms  TTP at B STP  - noticeable urethral swelling     Observation:  Decreased rounded/forward head posture.   Palpation:     INTERVENTIONS THIS SESSION: Manual Therapy: Internal and external PFM TP release, bilaterally with manual biofeedback for Kegel contraction. Due to increased PFM pain and decreased awareness this week and to decrease spasm and pain and allow for improved balance of musculature for improved function and decreased symptoms. (50 minutes)   Therex: Propped sitting PFM contraction for Kegel training- progressing from supine to progressively more sitting position (endurance against gravity progression). (10 minutes)   Total time: 60 min.                                PT Short Term Goals - 09/17/19 1649      PT SHORT TERM GOAL #1   Title  Patient will demonstrate improved pelvic alignment and balance of musculature surrounding the pelvis to facilitate decreased PFM spasms and decrease pressure on nerve roots to allow for optimal PFM function  Baseline  Has L foot-drop and pain in L glutes/knee as well as inability to sense PFM and L posterior innominate rotation/spasms surrounding pelvis.    Time  5    Period  Weeks    Status  On-going    Target Date  11/26/19      PT SHORT TERM GOAL #2   Title  Patient will demonstrate HEP x1 in the clinic to demonstrate understanding and proper form to allow for further improvement.    Baseline  Pt. lacks knowledge of therapeutic ecersise that will decrease Sx.    Time  5    Period  Weeks    Status  Achieved    Target Date  11/26/19      PT SHORT TERM GOAL #3   Title  Pt. will be able to walk 250 Ft.with CGA  and w/o assistive device to demonstrate improved balance/decreased instability and foot drop.    Baseline  Pt. using RW to AMB due to L foot-drop and increased instability.    Time  5    Period  Weeks    Status  Achieved    Target Date  08/20/19      PT SHORT TERM GOAL #4   Title  Patient will demonstrate a coordinated contraction, relaxation, and bulge of the pelvic floor muscles to demonstrate functional recruitment and motion and allow for further strengthening.    Baseline  Pt. reports inability to sense the PFM for kegel or relaxation, having mixed UI    Time  5    Period  Weeks    Status  Achieved    Target Date  08/20/19        PT Long Term Goals - 09/17/19 1651      PT LONG TERM GOAL #1   Title  Patient will report no episodes of UUI/SUI over the course of the prior two weeks to demonstrate improved functional ability.    Baseline  Having UUI at toiletside, h/o mild SUI before and during pregnancy    Time  10    Period  Weeks    Status  On-going    Target Date  11/26/19      PT LONG TERM GOAL #2   Title  Patient will report no pain with intercourse to demonstrate improved functional ability.    Baseline  having mild pain with initial penetration    Time  10    Period  Weeks    Status  Unable to assess    Target Date  11/26/19      PT LONG TERM GOAL #3   Title  Patient will score at or below 22/100 on the UDI and 27/100 on the UIQ to demonstrate a clinically meaningful decrease in disability and distress due to pelvic floor dysfunction.    Baseline  UDI: 67, UIQ: 72/100    Time  10    Period  Weeks    Status  On-going    Target Date  11/26/19      PT LONG TERM GOAL #4   Title  Pt. 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    Baseline  Pt. requiring RW for AMB due to L foot-drop and imbalance. As of 10/12: Pt. requiring some support (e.g. grocery cart) for  > 20 min. of AMB or stairs.    Time  10    Period  Weeks    Status  On-going    Target Date  11/26/19            Plan - 10/12/19 0940    Clinical Impression Statement  Pt. Responded well to all interventions today, demonstrating improved PFM contraction, and decreased PFM muscle spasms, as well as understanding and correct performance of all education and exercises provided today. They will continue to benefit from skilled physical therapy to work toward remaining goals and maximize function as well as decrease likelihood of symptom increase or recurrence.    Personal Factors and Comorbidities  Time since onset of injury/illness/exacerbation    Examination-Activity Limitations  Locomotion Level;Stand;Stairs;Lift;Squat;Toileting;Continence;Sit;Transfers;Caring for Others;Bend    Examination-Participation Restrictions  Interpersonal Relationship;Yard Work;Community Activity;Meal Prep;Laundry    Stability/Clinical Decision Making  Evolving/Moderate complexity    PT Frequency  2x / week    PT Duration  Other (comment)   10 weeks   PT Treatment/Interventions  ADLs/Self Care Home Management;Aquatic Therapy;Biofeedback;Electrical Stimulation;Gait training;Stair training;Balance training;Therapeutic exercise;Therapeutic activities;Functional mobility training;Neuromuscular re-education;Patient/family education;Manual techniques;Scar mobilization;Dry needling;Taping;Spinal Manipulations;Joint Manipulations    PT Next Visit Plan  reasssess thoracic mobility with scapular stability; TP release vs. DN to, hip-flexors, and R pectineus. re-assess for Piriformis spasm (have performed DN and TP release now); check in with long term HEP performance    PT Home Exercise Plan  rhomboid stabilization; TA in mod-quad, Side-stretch, multi-directional ankle AROM, hip-flexor stretch and TP release, posterior pelvic tilt in seated, mini-marches, tea-pot, bow-and-arrow, side-plank, calf-stretch, scapular retraction,  chin-tucks    Consulted and Agree with Plan of Care  Patient       Patient will benefit from skilled therapeutic intervention in order to improve the following deficits and impairments:  Abnormal gait, Decreased balance, Decreased endurance, Difficulty walking, Increased muscle spasms, Impaired tone, Improper body mechanics, Decreased activity tolerance, Decreased coordination, Decreased strength, Hypermobility, Postural dysfunction, Pain  Visit Diagnosis: Other muscle spasm  Foot drop, left  Sacrococcygeal disorders, not elsewhere classified     Problem List Patient Active Problem List   Diagnosis Date Noted  . Urinary incontinence 08/17/2019  . Foot drop, left 08/17/2019  . Maternal varicella, non-immune 11/20/2018   Tomasita Morrow, SPT  Logun Colavito 10/12/2019, 10:15 AM  [place] MAIN [facility] SERVICES [address] [place], [place], [address] Phone: [phone]   Fax:  [phone]  Name: Braya Stiver MRN: [id number] Date of Birth: [birth year]

## 2019-10-19 ENCOUNTER — Ambulatory Visit: Payer: No Typology Code available for payment source

## 2019-10-19 ENCOUNTER — Other Ambulatory Visit: Payer: Self-pay

## 2019-10-19 DIAGNOSIS — M21372 Foot drop, left foot: Secondary | ICD-10-CM

## 2019-10-19 DIAGNOSIS — M62838 Other muscle spasm: Secondary | ICD-10-CM | POA: Diagnosis not present

## 2019-10-19 DIAGNOSIS — M533 Sacrococcygeal disorders, not elsewhere classified: Secondary | ICD-10-CM

## 2019-10-19 NOTE — Patient Instructions (Addendum)
   Bring knees wide, heels together and toes apart while lying on your stomach. Gently squeeze the heels together and hold for ~ 1 sec, progressing slowly to 5 second holds. Repeat 10 times, 1 time per day.  3-Way Wall Stretches for Pelvic Floor Lengthening   Bring bottom close to the wall and gently press straighten knees to feel a stretch down the back of you thighs. Hold while taking 5 deep belly breaths and feeling the pelvic floor relax and lower on each inhale.    Let your legs fall to the side to feel a stretch on the inside of your thighs. If the stretch is too intense you can use a pillow to take some of the weight off by wedging it on the outside of your hips. Hold while taking 5 deep belly breaths and feeling the pelvic floor relax and lower on each inhale.    Slide feet down the wall and move hips slightly away from the wall and then let your knees fall to the sides so you feel a stretch on the inside of the thighs near your groin. Hold while taking 5 deep belly breaths and feeling the pelvic floor relax and lower on each inhale.     *Perform each stretch in sequence 3 times, once a day    A- ankle exercises (swap theraband dorsiflexion with heel walking, 3 sets of 12 steps (or until fatigue or compensations occurs) , calf stretch, hip flexor stretch, 3 way wall stretch, side stretch, calf raises, Dorthy's   B- pelvic tilts (deep core level 2) (seated pelvic tilts while at work), front planks, bow and arrow, scapular retractions, mid shoulder strengthening (with theraband)  Every day- Kegels, side planks   PRN- K-tape at SIJ; tennis ball release   Deep Core Level 2  -With pillow under hips, do pelvic tilts as you have been (breath out and tuck under, back towards the floor) At end of exhale bring knee slightly out the the side while engaging abdominals. Perform 2 sets of 10 on each knee.

## 2019-10-19 NOTE — Therapy (Signed)
[place] MAIN [facility] SERVICES [address] [place], [place], [address] Phone: [phone]   Fax:  [phone]  Physical Therapy Treatment The patient has been informed of current processes in place at Outpatient Rehab to protect patients from Covid-19 exposure including social distancing, schedule modifications, and new cleaning procedures. After discussing their particular risk with a therapist based on the patient's personal risk factors, the patient has decided to proceed with in-person therapy.  Patient Details  Name: Lisa Brady MRN: [id number] Date of Birth: [birth year] Referring Provider (PT): [name]   Encounter Date: 10/19/2019  PT End of Session - 10/19/19 0856    Visit Number  17    Number of Visits  23    Date for PT Re-Evaluation  09/24/19    Authorization Type  [place]    Authorization - Visit Number  4    Authorization - Number of Visits  10    PT Start Time  0900    PT Stop Time  [id number]    PT Time Calculation (min)  68 min    Activity Tolerance  Patient tolerated treatment well;No increased pain    Behavior During Therapy  WFL for tasks assessed/performed       Past Medical History:  Diagnosis Date  . Foot drop     Past Surgical History:  Procedure Laterality Date  . WISDOM TOOTH EXTRACTION  2006    There were no vitals filed for this visit.     Pelvic Floor Physical Therapy Treatment Note  SCREENING  Changes in medications, allergies, or medical history?: none   SUBJECTIVE  Patient reports: Feeling "ok". Baby slept pretty good last night. Tried to do external TP release- and it was "a fumbling mess". Is going to make an appointment with the mdiwives because she has continued to bleed vaginaly. Hook-lying posterior pelvic tilts - review please. Increased urgency in the last week. Had an accident. Feeling heavy in the pelvis as of late. Regressed noted after long walk the previous weekend.     Precautions:  15 weeks PP  Pain update:  Location of pain:  L hip/low back  Current pain:  0/10  Max pain:  3/10 NPS intermittently  Least pain:  0/10 Nature of pain: deep ache   Patient Goals: Control her bladder/not need depends.   OBJECTIVE  Changes in: Pelvic floor: Not assessed this session   MMT LE  - L DF 5/5  - R DF 5/5   Observation:  Decreased rounded/forward head posture.  Palpation:  TTP B piriformis  TTP paraspinals R L3-5  TTP R QL   Hypomobility T4-9; L1-4  Gait Analysis: Gait speed: 1.68m/s ; anterior pelvic tilt, knee hyperextension, heel strike - decreased with verbal education.   Patient Outcome Measure: UDI-6 55.5/100; POPDI-6 11/100; UIQ 76/100   INTERVENTIONS THIS SESSION: Manual Therapy: TP release for L3-5 paraspinals/multifidi, B piriformis, and QL to decrease spasm and pain and allow for improved balance of musculature for improved function and decreased symptoms. T4-L5 grade II mobs, T4-9 Grade II/III PA mobs, and L1-4 II/III PA mobs for improved mobility and improved muscle recruitment. (40 minutes)   Therex: Dorthy's for SIJ stability. Adjusting HEP for patient understanding and compliance. Patient demonstrated MMT 5/5 DF on L, and ankle DF band exercises progressed to functional heel walking with appropriate core engagement. K tape at B SIJ for improved proprioception for improved stability and improvement with symptoms. (20 minutes)   Gait Training: Adjusting  pelvic alignment during ambulation for decreased force through pelvic floor muscles (8 minutes)   Total time: 68 min.                                PT Short Term Goals - 09/17/19 1649      PT SHORT TERM GOAL #1   Title  Patient will demonstrate improved pelvic alignment and balance of musculature surrounding the pelvis to facilitate decreased PFM spasms and decrease pressure on nerve roots to allow for optimal PFM function    Baseline  Has L  foot-drop and pain in L glutes/knee as well as inability to sense PFM and L posterior innominate rotation/spasms surrounding pelvis.    Time  5    Period  Weeks    Status  On-going    Target Date  11/26/19      PT SHORT TERM GOAL #2   Title  Patient will demonstrate HEP x1 in the clinic to demonstrate understanding and proper form to allow for further improvement.    Baseline  Pt. lacks knowledge of therapeutic ecersise that will decrease Sx.    Time  5    Period  Weeks    Status  Achieved    Target Date  11/26/19      PT SHORT TERM GOAL #3   Title  Pt. will be able to walk 250 Ft.with CGA and w/o assistive device to demonstrate improved balance/decreased instability and foot drop.    Baseline  Pt. using RW to AMB due to L foot-drop and increased instability.    Time  5    Period  Weeks    Status  Achieved    Target Date  08/20/19      PT SHORT TERM GOAL #4   Title  Patient will demonstrate a coordinated contraction, relaxation, and bulge of the pelvic floor muscles to demonstrate functional recruitment and motion and allow for further strengthening.    Baseline  Pt. reports inability to sense the PFM for kegel or relaxation, having mixed UI    Time  5    Period  Weeks    Status  Achieved    Target Date  08/20/19        PT Long Term Goals - 10/19/19 1157      PT LONG TERM GOAL #1   Title  Patient will report no episodes of UUI/SUI over the course of the prior two weeks to demonstrate improved functional ability.    Baseline  Having UUI at toiletside, h/o mild SUI before and during pregnancy    Time  10    Period  Weeks    Status  On-going    Target Date  11/26/19      PT LONG TERM GOAL #2   Title  Patient will report no pain with intercourse to demonstrate improved functional ability.    Baseline  having mild pain with initial penetration    Time  10    Period  Weeks    Status  Unable to assess    Target Date  11/26/19      PT LONG TERM GOAL #3   Title  Patient  will score at or below 22/100 on the UDI and 27/100 on the UIQ to demonstrate a clinically meaningful decrease in disability and distress due to pelvic floor dysfunction.    Baseline  UDI: 67, UIQ: 72/100 10-19-19: UDI 55.5, UIQ 76/100; POPDI  11/100    Time  10    Period  Weeks    Status  On-going    Target Date  11/26/19      PT LONG TERM GOAL #4   Title  Pt. 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    Baseline  Pt. requiring RW for AMB due to L foot-drop and imbalance. As of 10/12: Pt. requiring some support (e.g. grocery cart) for > 20 min. of AMB or stairs.    Time  10    Period  Weeks    Status  On-going    Target Date  11/26/19            Plan - 10/19/19 0856    Clinical Impression Statement  Pt. Responded well to all interventions today, demonstrating improved TrA activation, decreased hypomobility along throacolumbar, as well as understanding and correct performance of all education and exercises provided today. They will continue to benefit from skilled physical therapy to work toward remaining goals and maximize function as well as decrease likelihood of symptom increase or recurrence.    Personal Factors and Comorbidities  Time since onset of injury/illness/exacerbation    Examination-Activity Limitations  Locomotion Level;Stand;Stairs;Lift;Squat;Toileting;Continence;Sit;Transfers;Caring for Others;Bend    Examination-Participation Restrictions  Interpersonal Relationship;Yard Work;Community Activity;Meal Prep;Laundry    Stability/Clinical Decision Making  Evolving/Moderate complexity    PT Frequency  2x / week    PT Duration  Other (comment)   10 weeks   PT Treatment/Interventions  ADLs/Self Care Home Management;Aquatic Therapy;Biofeedback;Electrical Stimulation;Gait training;Stair training;Balance training;Therapeutic exercise;Therapeutic activities;Functional  mobility training;Neuromuscular re-education;Patient/family education;Manual techniques;Scar mobilization;Dry needling;Taping;Spinal Manipulations;Joint Manipulations    PT Next Visit Plan  Address gait- decrease anterior pelvic tilt and hypeextension at knee. reasssess thoracic mobility with scapular stability; TP release vs. DN to, hip-flexors, and R pectineus. re-assess for Piriformis spasm (have performed DN and TP release now); check in with long term HEP performance    PT Home Exercise Plan  A and B days: A- ankle exercises (swap theraband dorsiflexion with heel walking, 3 sets of 12 steps (or until fatigue or compensations occurs) , calf stretch, hip flexor stretch, 3 way wall stretch, side stretch, calf raises, Dorthy's B- pelvic tilts (deep core level 2) (seated pelvic tilts while at work), front planks, bow and arrow, scapular retractions, mid shoulder strengthening (with theraband) Every day- Kegels, side planks PRN- K-tape at SIJ; tennis ball release    Consulted and Agree with Plan of Care  Patient       Patient will benefit from skilled therapeutic intervention in order to improve the following deficits and impairments:  Abnormal gait, Decreased balance, Decreased endurance, Difficulty walking, Increased muscle spasms, Impaired tone, Improper body mechanics, Decreased activity tolerance, Decreased coordination, Decreased strength, Hypermobility, Postural dysfunction, Pain  Visit Diagnosis: Other muscle spasm  Foot drop, left  Sacrococcygeal disorders, not elsewhere classified     Problem List Patient Active Problem List   Diagnosis Date Noted  . Urinary incontinence 08/17/2019  . Foot drop, left 08/17/2019  . Maternal varicella, non-immune 11/20/2018   Tomasita Morrow, SPT  Machelle Raybon 10/19/2019, 12:00 PM  [place] MAIN [facility] SERVICES [address] [place], [place], [address] Phone: [phone]   Fax:  [phone]  Name:  Lisa Brady MRN: [id number] Date of Birth: Sep 16, 1986

## 2019-10-26 ENCOUNTER — Ambulatory Visit: Payer: No Typology Code available for payment source

## 2019-10-26 ENCOUNTER — Other Ambulatory Visit: Payer: Self-pay

## 2019-10-26 ENCOUNTER — Ambulatory Visit: Payer: No Typology Code available for payment source | Admitting: Internal Medicine

## 2019-10-26 ENCOUNTER — Encounter: Payer: Self-pay | Admitting: Family Medicine

## 2019-10-26 ENCOUNTER — Ambulatory Visit (INDEPENDENT_AMBULATORY_CARE_PROVIDER_SITE_OTHER): Payer: No Typology Code available for payment source | Admitting: Family Medicine

## 2019-10-26 VITALS — BP 110/58 | HR 74 | Temp 98.7°F | Ht 63.0 in | Wt 125.4 lb

## 2019-10-26 DIAGNOSIS — N921 Excessive and frequent menstruation with irregular cycle: Secondary | ICD-10-CM | POA: Diagnosis not present

## 2019-10-26 DIAGNOSIS — D649 Anemia, unspecified: Secondary | ICD-10-CM | POA: Diagnosis not present

## 2019-10-26 DIAGNOSIS — M62838 Other muscle spasm: Secondary | ICD-10-CM | POA: Diagnosis not present

## 2019-10-26 DIAGNOSIS — M21372 Foot drop, left foot: Secondary | ICD-10-CM

## 2019-10-26 DIAGNOSIS — Z7689 Persons encountering health services in other specified circumstances: Secondary | ICD-10-CM

## 2019-10-26 DIAGNOSIS — Z3041 Encounter for surveillance of contraceptive pills: Secondary | ICD-10-CM

## 2019-10-26 DIAGNOSIS — N3946 Mixed incontinence: Secondary | ICD-10-CM

## 2019-10-26 DIAGNOSIS — M533 Sacrococcygeal disorders, not elsewhere classified: Secondary | ICD-10-CM

## 2019-10-26 MED ORDER — NORETHINDRONE ACET-ETHINYL EST 1.5-30 MG-MCG PO TABS: 1.0000 | ORAL_TABLET | Freq: Every day | ORAL | 4 refills | Status: DC

## 2019-10-26 NOTE — Therapy (Signed)
[place] MAIN [facility] SERVICES [address] [place], [place], [address] Phone: [phone]   Fax:  [phone]  Physical Therapy Treatment The patient has been informed of current processes in place at Outpatient Rehab to protect patients from Covid-19 exposure including social distancing, schedule modifications, and new cleaning procedures. After discussing their particular risk with a therapist based on the patient's personal risk factors, the patient has decided to proceed with in-person therapy.  Patient Details  Name: Lisa Brady MRN: [id number] Date of Birth: 1986/04/16 Referring Provider (PT): [name]   Encounter Date: 10/26/2019  PT End of Session - 10/26/19 0858    Visit Number  18    Number of Visits  23    Date for PT Re-Evaluation  11/26/19    Authorization Type  [place]    Authorization - Visit Number  5    Authorization - Number of Visits  10    PT Start Time  0900    PT Stop Time  1005    PT Time Calculation (min)  65 min    Activity Tolerance  Patient tolerated treatment well;No increased pain    Behavior During Therapy  WFL for tasks assessed/performed       Past Medical History:  Diagnosis Date  . Foot drop     Past Surgical History:  Procedure Laterality Date  . WISDOM TOOTH EXTRACTION  2006    There were no vitals filed for this visit.    Pelvic Floor Physical Therapy Treatment Note  SCREENING  Changes in medications, allergies, or medical history?: none    SUBJECTIVE  Patient reports: Doing more exercises throughout the day. 30 steps without fatigue (heel walking). Did internal release 2x in the last week. Uses the tennis ball release every night (QL's 2-3x, hip flexors 2-3x, glutes 2-3x) No bladder episodes - less episodes in the shower. Less urgency at work too. Still discouraged from accident last week. Shares more awareness of posture changes- catching self more often.     Precautions:  16 weeks PP  Pain update:  Location of pain:  L hip/low back (near QL)  Current pain:  2/10  Max pain:  2/10 NPS intermittently  Least pain:  0/10 Nature of pain: deep ache  Feeling fine this morning, then started to have L QL pain. Had tried to use tennis ball release last night.   Resolution of pain at EOS   Patient Goals: Control her bladder/not need depends.   OBJECTIVE  Changes in: Pelvic floor: Not assessed this session   MMT LE  - L DF 5/5  - R DF 5/5   Observation:  Decreased rounded/forward head posture.  Palpation:  TTP L  Piriformis; glute med  TTP paraspinals L T5-L2  TTP R QL    Gait Analysis: Gait speed: 1.20m/s ; anterior pelvic tilt, knee hyperextension, heel strike - with cues pelvic neutral achieved, with appropriate arm swing and decreased heel strike.   Patient Outcome Measure: N/A   INTERVENTIONS THIS SESSION: Manual Therapy: L QL, L paraspinals along L1-5 TP release, R Piriformis and glute med TP release to decrease spasm and pain and allow for improved balance of musculature for improved function and decreased symptoms. Myofascial tension along R LE decreased following R gluteal TP release.  PA mobs grades II/II along T5-L2 performed to decrease radiating symptoms into L LE, resolved following manual therapy.  (40 minutes)   Therex: Hip flexor stretch (end of bed) replacing foam  rolling hip flexor stretch. Initiating foam roller for gluteal region to reduce tenderness with tennis ball TP release. (10 minutes)   Gait Training: Adjusting pelvic alignment during ambulation for decreased force through pelvic floor muscles. Cues for unlocking knees, stand tall, chest slightly leading the hips, and soft feet all assisted in maintaining gait improvements. Patient will benefit from dynamic gait training to address fatigability of the lumbopelvic region. (15 minutes)   Total time: 65 min.      PT Short Term Goals - 09/17/19 1649       PT SHORT TERM GOAL #1   Title  Patient will demonstrate improved pelvic alignment and balance of musculature surrounding the pelvis to facilitate decreased PFM spasms and decrease pressure on nerve roots to allow for optimal PFM function    Baseline  Has L foot-drop and pain in L glutes/knee as well as inability to sense PFM and L posterior innominate rotation/spasms surrounding pelvis.    Time  5    Period  Weeks    Status  On-going    Target Date  11/26/19      PT SHORT TERM GOAL #2   Title  Patient will demonstrate HEP x1 in the clinic to demonstrate understanding and proper form to allow for further improvement.    Baseline  Pt. lacks knowledge of therapeutic ecersise that will decrease Sx.    Time  5    Period  Weeks    Status  Achieved    Target Date  11/26/19      PT SHORT TERM GOAL #3   Title  Pt. will be able to walk 250 Ft.with CGA and w/o assistive device to demonstrate improved balance/decreased instability and foot drop.    Baseline  Pt. using RW to AMB due to L foot-drop and increased instability.    Time  5    Period  Weeks    Status  Achieved    Target Date  08/20/19      PT SHORT TERM GOAL #4   Title  Patient will demonstrate a coordinated contraction, relaxation, and bulge of the pelvic floor muscles to demonstrate functional recruitment and motion and allow for further strengthening.    Baseline  Pt. reports inability to sense the PFM for kegel or relaxation, having mixed UI    Time  5    Period  Weeks    Status  Achieved    Target Date  08/20/19        PT Long Term Goals - 10/19/19 1157      PT LONG TERM GOAL #1   Title  Patient will report no episodes of UUI/SUI over the course of the prior two weeks to demonstrate improved functional ability.    Baseline  Having UUI at toiletside, h/o mild SUI before and during pregnancy    Time  10    Period  Weeks    Status  On-going    Target Date  11/26/19      PT LONG TERM GOAL #2   Title  Patient will  report no pain with intercourse to demonstrate improved functional ability.    Baseline  having mild pain with initial penetration    Time  10    Period  Weeks    Status  Unable to assess    Target Date  11/26/19      PT LONG TERM GOAL #3   Title  Patient will score at or below 22/100 on the UDI and 27/100  on the UIQ to demonstrate a clinically meaningful decrease in disability and distress due to pelvic floor dysfunction.    Baseline  UDI: 67, UIQ: 72/100 10-19-19: UDI 55.5, UIQ 76/100; POPDI 11/100    Time  10    Period  Weeks    Status  On-going    Target Date  11/26/19      PT LONG TERM GOAL #4   Title  Pt. 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    Baseline  Pt. requiring RW for AMB due to L foot-drop and imbalance. As of 10/12: Pt. requiring some support (e.g. grocery cart) for > 20 min. of AMB or stairs.    Time  10    Period  Weeks    Status  On-going    Target Date  11/26/19            Plan - 10/26/19 0859    Clinical Impression Statement  Pt. Responded well to all interventions today, demonstrating resolution of pain at EOS and improved maintainence of abdominal engagement with gait training, as well as understanding and correct performance of all education and exercises provided today. They will continue to benefit from skilled physical therapy to work toward remaining goals and maximize function as well as decrease likelihood of symptom increase or recurrence.     Personal Factors and Comorbidities  Time since onset of injury/illness/exacerbation    Examination-Activity Limitations  Locomotion Level;Stand;Stairs;Lift;Squat;Toileting;Continence;Sit;Transfers;Caring for Others;Bend    Examination-Participation Restrictions  Interpersonal Relationship;Yard Work;Community Activity;Meal Prep;Laundry    Stability/Clinical Decision Making  Evolving/Moderate  complexity    PT Frequency  2x / week    PT Duration  Other (comment)   10 weeks   PT Treatment/Interventions  ADLs/Self Care Home Management;Aquatic Therapy;Biofeedback;Electrical Stimulation;Gait training;Stair training;Balance training;Therapeutic exercise;Therapeutic activities;Functional mobility training;Neuromuscular re-education;Patient/family education;Manual techniques;Scar mobilization;Dry needling;Taping;Spinal Manipulations;Joint Manipulations    PT Next Visit Plan  stabilize gait pattern; treat spinal mobility to reduce L LE radiating symptoms; TP release vs. DN to, hip-flexors, and R pectineus. re-assess for Piriformis spasm (have performed DN and TP release now); check in with long term HEP performance    PT Home Exercise Plan  added end of bed hip flexor to replace tennis ball to hip flexors. A and B days: A- ankle exercises (swap theraband dorsiflexion with heel walking, 3 sets of 12 steps (or until fatigue or compensations occurs) , calf stretch, hip flexor stretch, 3 way wall stretch, side stretch, calf raises, Dorthy's B- pelvic tilts (deep core level 2) (seated pelvic tilts while at work), front planks, bow and arrow, scapular retractions, mid shoulder strengthening (with theraband) Every day- Kegels, side planks PRN- K-tape at SIJ; tennis ball release    Consulted and Agree with Plan of Care  Patient       Patient will benefit from skilled therapeutic intervention in order to improve the following deficits and impairments:  Abnormal gait, Decreased balance, Decreased endurance, Difficulty walking, Increased muscle spasms, Impaired tone, Improper body mechanics, Decreased activity tolerance, Decreased coordination, Decreased strength, Hypermobility, Postural dysfunction, Pain  Visit Diagnosis: Other muscle spasm  Foot drop, left  Sacrococcygeal disorders, not elsewhere classified     Problem List Patient Active Problem List   Diagnosis Date Noted  . Urinary  incontinence 08/17/2019  . Foot drop, left 08/17/2019  . Maternal varicella, non-immune 11/20/2018   Luisalberto Beegle, SPT  Willa Rough 10/26/2019,  10:33 AM  [place] MAIN [facility] SERVICES [address] [place], [place], [address] Phone: [phone]   Fax:  [phone]  Name: Lisa Brady MRN: [id number] Date of Birth: 1986-04-16

## 2019-10-26 NOTE — Patient Instructions (Addendum)
Try foam roller on glutes then tennis ball.   Great job catching your posture!    Hip flexor Stretch at end of bed- replacing the tennis ball release for hip flexors       Sit your bottom at the edge of the bed, pull your knee into your chest and keep it close as you lie back, feel the stretch down the front of the leg that is down. Hold for 5 deep breaths, repease 2-3 times for each side 1-2 times per day. You may use a towel to help hold the knee to your chest if needed.

## 2019-10-26 NOTE — Progress Notes (Signed)
Subjective:    Patient ID: Lisa Brady, female    DOB: Mar 29, 1986, 33 y.o.   MRN: [id number]  HPI Chief Complaint  Patient presents with  . New Patient (Initial Visit)    Birth control - having break thru bleeding x 6 weeks. Heavy at times. Has tried multiple types of BC and has not tolerated - still having bleeding/spotting.    This is a 33 yo female who presents today for above cc and to establish care. 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 Previously seen by Philis Nettle, NP at [facility]- [facility]. She had healthy baby boy 07/04/19. She is married to [place].   Breakthrough bleeding- Was started on progesterone only OCP 6 weeks after delivery while breast feeding. Was not able to continue to breast feed. At 12 weeks, started combination pills, same prescription as prior to getting pregnant. Has had breakthrough bleeding with periods of no bleeding.   Post partum course- had nerve damage with resulting foot drop and urinary incontinence. Foot drop significantly improved, has been going to pelvic floor PT with improvement but not complete resolution of urinary incontinence. Has had mastitis, blepharitis of right eyelid. Feels like things are getting better, coping with going back to work. Until recently, baby was sleeping well but has been awakening middle of the night for feeding, likely temporary.   Anemia following delivery- labs reviewed. Patient declines recheck today, denies weakness, lightheadedness.   Past Medical History:  Diagnosis Date  . Foot drop    Past Surgical History:  Procedure Laterality Date  . WISDOM TOOTH EXTRACTION  2006   Family History  Problem Relation Age of Onset  . Cancer Mother   . Breast cancer Mother   . Melanoma Mother   . Hyperlipidemia Father   . Hypertension Father   . Hyperlipidemia Brother   . Cancer Maternal Grandmother   . Brain cancer Maternal Grandmother   . Heart disease Maternal Grandfather   . Cancer Paternal Grandmother   . Lung  cancer Paternal Grandmother   . Cancer Paternal Grandfather   . Lung cancer Paternal Grandfather   . Hyperlipidemia Brother   . Hyperlipidemia Brother   . Hyperlipidemia Brother    Social History   Tobacco Use  . Smoking status: Never Smoker  . Smokeless tobacco: Never Used  Substance Use Topics  . Alcohol use: Not Currently  . Drug use: Never      Review of Systems Per HPI    Objective:   Physical Exam Vitals signs reviewed.  Constitutional:      Appearance: Normal appearance. She is normal weight.  HENT:     Head: Normocephalic and atraumatic.  Eyes:     Conjunctiva/sclera: Conjunctivae normal.  Cardiovascular:     Rate and Rhythm: Normal rate.  Pulmonary:     Effort: Pulmonary effort is normal.  Neurological:     Mental Status: She is alert and oriented to person, place, and time.  Psychiatric:        Mood and Affect: Mood normal.        Behavior: Behavior normal.        Thought Content: Thought content normal.        Judgment: Judgment normal.       BP (!) 110/58 (BP Location: Left Arm, Patient Position: Sitting, Cuff Size: Normal)   Pulse 74   Temp 98.7 F (37.1 C) (Temporal)   Ht 5\' 3"  (1.6 m)   Wt 125 lb 6.4 oz (56.9 kg)  SpO2 99%   BMI 22.21 kg/m  Wt Readings from Last 3 Encounters:  10/26/19 125 lb 6.4 oz (56.9 kg)  09/10/19 130 lb (59 kg)  09/07/19 130 lb 9.6 oz (59.2 kg)   Depression screen [facility] 2/9 10/26/2019 08/17/2019  Decreased Interest 0 0  Down, Depressed, Hopeless 0 1  PHQ - 2 Score 0 1  Altered sleeping - 0  Tired, decreased energy - 1  Change in appetite - 0  Feeling bad or failure about yourself  - 1  Trouble concentrating - 0  Moving slowly or fidgety/restless - 0  Suicidal thoughts - 0  PHQ-9 Score - 3  Difficult doing work/chores - Somewhat difficult       Assessment & Plan:  1. Encounter to establish care - reviewed health maintenance, she reports UTD on Pap, declines any blood work at this time, will wait until  spring  2. Uses oral contraception - Norethindrone Acetate-Ethinyl Estradiol (LOESTRIN) 1.5-30 MG-MCG tablet; Take 1 tablet by mouth daily.  Dispense: 3 Package; Refill: 4  3. Breakthrough bleeding on birth control pills - she has discussed with an ob/gyn friend who recommended a short time off hormone which she will try. She will use back up contraception if sexually active.   4. Anemia, unspecified type - post partum, she declines recheck of cbc today  5. Mixed stress and urge urinary incontinence - she plans to continue pelvic floor PT for next two months and follow up with urology  6. Foot drop, left - improving   Clarene Reamer, FNP-BC  [facility], [place]  10/27/2019 7:15 AM   This visit occurred during the SARS-CoV-2 public health emergency.  Safety protocols were in place, including screening questions prior to the visit, additional usage of staff PPE, and extensive cleaning of exam room while observing appropriate contact time as indicated for disinfecting solutions.

## 2019-10-26 NOTE — Patient Instructions (Signed)
Good to see you today  I have sent in a 3 month supply of your OCPs

## 2019-10-27 ENCOUNTER — Encounter: Payer: Self-pay | Admitting: Family Medicine

## 2019-11-02 ENCOUNTER — Ambulatory Visit: Payer: No Typology Code available for payment source

## 2019-11-07 ENCOUNTER — Encounter: Payer: Self-pay | Admitting: Family Medicine

## 2019-11-07 ENCOUNTER — Other Ambulatory Visit: Payer: Self-pay | Admitting: Family Medicine

## 2019-11-07 DIAGNOSIS — H00012 Hordeolum externum right lower eyelid: Secondary | ICD-10-CM

## 2019-11-07 MED ORDER — ERYTHROMYCIN 5 MG/GM OP OINT: 1.0000 "application " | TOPICAL_OINTMENT | Freq: Three times a day (TID) | OPHTHALMIC | 0 refills | Status: DC

## 2019-11-09 ENCOUNTER — Ambulatory Visit: Payer: No Typology Code available for payment source

## 2019-11-09 ENCOUNTER — Ambulatory Visit: Payer: No Typology Code available for payment source | Attending: Obstetrics and Gynecology

## 2019-11-09 ENCOUNTER — Other Ambulatory Visit: Payer: Self-pay

## 2019-11-09 DIAGNOSIS — M533 Sacrococcygeal disorders, not elsewhere classified: Secondary | ICD-10-CM | POA: Diagnosis present

## 2019-11-09 DIAGNOSIS — M62838 Other muscle spasm: Secondary | ICD-10-CM | POA: Diagnosis not present

## 2019-11-09 DIAGNOSIS — M21372 Foot drop, left foot: Secondary | ICD-10-CM | POA: Insufficient documentation

## 2019-11-09 NOTE — Patient Instructions (Addendum)
     Keep the back flat and even as you slowly and smoothly shift your weight back toward your heels and then forward over your wrists. You can use a small ball in the small of your back or a mirror to help you know your back is flat.   Rock back and forth _10__ times _1__ times per day.  Mini-Marches    Exhale, drawing the lower tummy (TA) in toward the back bone and hold contraction while you lift one foot ~ 2 inches off the mat, then the other foot before relaxing and resetting. Try to keep your hips from rocking, using your hands to sense whether they are staying even as pictured.      Perform _10__ repetitions for _3__ sets. Do this _1_ times per day.

## 2019-11-09 NOTE — Therapy (Signed)
[place] MAIN [facility] SERVICES [address] [place], [place], [address] Phone: [phone]   Fax:  [phone]  Physical Therapy Treatment The patient has been informed of current processes in place at Outpatient Rehab to protect patients from Covid-19 exposure including social distancing, schedule modifications, and new cleaning procedures. After discussing their particular risk with a therapist based on the patient's personal risk factors, the patient has decided to proceed with in-person therapy.   Patient Details  Name: Lisa Brady MRN: [id number] Date of Birth: Dec 01, 1986 Referring Provider (PT): [name]   Encounter Date: 11/09/2019  PT End of Session - 11/09/19 0926    Visit Number  19    Number of Visits  23    Date for PT Re-Evaluation  11/26/19    Authorization Type  [place]    Authorization - Visit Number  6    Authorization - Number of Visits  10    PT Start Time  0803    PT Stop Time  0905    PT Time Calculation (min)  62 min    Activity Tolerance  Patient tolerated treatment well;No increased pain    Behavior During Therapy  WFL for tasks assessed/performed       Past Medical History:  Diagnosis Date  . Foot drop     Past Surgical History:  Procedure Laterality Date  . WISDOM TOOTH EXTRACTION  2006    There were no vitals filed for this visit.    Pelvic Floor Physical Therapy Treatment Note  SCREENING  Changes in medications, allergies, or medical history?: none    SUBJECTIVE  Patient reports: Had "a little bit" of leakage when brushing teeth and some at night with coughing/sneezing. Tried using probe at a 45 degree angle and found that it wants to fall out immediately. Had a couple days where she felt the heaviness/fullness and tried doing the tilts with the pillow. ~ 3 times this week she felt that fullness. Only had 2 days at work where she feels more urgency. Foot is doing great. Able to do  heel-walk for 30 steps before fatigue. Pain has been manageable but is using tennis ball release regularly to maintain.   Precautions:  18 weeks PP  Pain update:  Location of pain:  R hip/low back (near QL)  Current pain:  0/10  Max pain:  2/10 NPS intermittently  Least pain:  0/10 Nature of pain: deep ache    Patient Goals: Control her bladder/not need depends.   OBJECTIVE  Changes in: Pelvic floor: Not assessed this session   MMT LE  - L DF 4+/5 (different rater may account for difference but could be due to spasms in back since test performed prior to DN/manual) - R DF 5/5   Observation:  Decreased rounded/forward head posture.  Palpation:  TTP to erector spinae and multifidus on L at T11, L1, and B at L5  Gait Analysis: (from prior session) Gait speed: 1.28m/s ; anterior pelvic tilt, knee hyperextension, heel strike - with cues pelvic neutral achieved, with appropriate arm swing and decreased heel strike.   Patient Outcome Measure: N/A   INTERVENTIONS THIS SESSION: Manual Therapy: Performed TP release and STM to erector spinae and multifidus on L at T11, L1, and B at L5 to decrease spasm and pain and allow for improved balance of musculature for improved function and decreased symptoms.  Therex:  Up-graded deep-core exercise to mini-marches (L&R as 1 rep) from knee fall-outs (IND  side as 1 rep) and educated on and practiced A/P shifts in quadruped to improve deep-core stability through a variety of positions.  Self-care: Educated on [employer] as an option to decrease downward shifting of prolapse with daily activity and increase proprioception for kegels.   Total time: 62 min.                    Trigger Point Dry Needling - 11/09/19 0001    Consent Given?  Yes    Education Handout Provided  No    Muscles Treated Back/Hip  Erector spinae;Lumbar multifidi    Dry Needling Comments  left ~T11, L1, L5 and R L5    Erector spinae Response   Twitch response elicited;Palpable increased muscle length    Lumbar multifidi Response  Twitch response elicited;Palpable increased muscle length             PT Short Term Goals - 09/17/19 1649      PT SHORT TERM GOAL #1   Title  Patient will demonstrate improved pelvic alignment and balance of musculature surrounding the pelvis to facilitate decreased PFM spasms and decrease pressure on nerve roots to allow for optimal PFM function    Baseline  Has L foot-drop and pain in L glutes/knee as well as inability to sense PFM and L posterior innominate rotation/spasms surrounding pelvis.    Time  5    Period  Weeks    Status  On-going    Target Date  11/26/19      PT SHORT TERM GOAL #2   Title  Patient will demonstrate HEP x1 in the clinic to demonstrate understanding and proper form to allow for further improvement.    Baseline  Pt. lacks knowledge of therapeutic ecersise that will decrease Sx.    Time  5    Period  Weeks    Status  Achieved    Target Date  11/26/19      PT SHORT TERM GOAL #3   Title  Pt. will be able to walk 250 Ft.with CGA and w/o assistive device to demonstrate improved balance/decreased instability and foot drop.    Baseline  Pt. using RW to AMB due to L foot-drop and increased instability.    Time  5    Period  Weeks    Status  Achieved    Target Date  08/20/19      PT SHORT TERM GOAL #4   Title  Patient will demonstrate a coordinated contraction, relaxation, and bulge of the pelvic floor muscles to demonstrate functional recruitment and motion and allow for further strengthening.    Baseline  Pt. reports inability to sense the PFM for kegel or relaxation, having mixed UI    Time  5    Period  Weeks    Status  Achieved    Target Date  08/20/19        PT Long Term Goals - 10/19/19 1157      PT LONG TERM GOAL #1   Title  Patient will report no episodes of UUI/SUI over the course of the prior two weeks to demonstrate improved functional ability.     Baseline  Having UUI at toiletside, h/o mild SUI before and during pregnancy    Time  10    Period  Weeks    Status  On-going    Target Date  11/26/19      PT LONG TERM GOAL #2   Title  Patient will report no pain  with intercourse to demonstrate improved functional ability.    Baseline  having mild pain with initial penetration    Time  10    Period  Weeks    Status  Unable to assess    Target Date  11/26/19      PT LONG TERM GOAL #3   Title  Patient will score at or below 22/100 on the UDI and 27/100 on the UIQ to demonstrate a clinically meaningful decrease in disability and distress due to pelvic floor dysfunction.    Baseline  UDI: 67, UIQ: 72/100 10-19-19: UDI 55.5, UIQ 76/100; POPDI 11/100    Time  10    Period  Weeks    Status  On-going    Target Date  11/26/19      PT LONG TERM GOAL #4   Title  Pt. 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    Baseline  Pt. requiring RW for AMB due to L foot-drop and imbalance. As of 10/12: Pt. requiring some support (e.g. grocery cart) for > 20 min. of AMB or stairs.    Time  10    Period  Weeks    Status  On-going    Target Date  11/26/19            Plan - 11/09/19 0926    Clinical Impression Statement  Pt. Responded well to all interventions today, demonstrating improved deep-core recruitment and LE strength as well as understanding and correct performance of all education and exercises provided today. They will continue to benefit from skilled physical therapy to work toward remaining goals and maximize function as well as decrease likelihood of symptom increase or recurrence.     PT Next Visit Plan  biofeedback, stabilize gait pattern; treat spinal mobility to reduce L LE radiating symptoms; TP release vs. DN to, hip-flexors, and R pectineus. re-assess for Piriformis spasm (have performed DN and TP release now); check in  with long term HEP performance    PT Home Exercise Plan  added end of bed hip flexor to replace tennis ball to hip flexors. A and B days: A- ankle exercises (swap theraband dorsiflexion with heel walking, 3 sets of 12 steps (or until fatigue or compensations occurs) , calf stretch, hip flexor stretch, 3 way wall stretch, side stretch, calf raises, Dorthy's B- mini-marches (seated pelvic tilts while at work), front planks, bow and arrow, scapular retractions, mid shoulder strengthening (with theraband) Every day- Kegels, side planks PRN- K-tape at SIJ; tennis ball release    Consulted and Agree with Plan of Care  Patient       Patient will benefit from skilled therapeutic intervention in order to improve the following deficits and impairments:     Visit Diagnosis: Other muscle spasm  Foot drop, left  Sacrococcygeal disorders, not elsewhere classified     Problem List Patient Active Problem List   Diagnosis Date Noted  . Urinary incontinence 08/17/2019  . Foot drop, left 08/17/2019  . Maternal varicella, non-immune 11/20/2018   Willa Rough DPT, ATC Willa Rough 11/09/2019, 9:37 AM  [place] MAIN [facility] SERVICES [address] [place], [place], [address] Phone: [phone]   Fax:  [phone]  Name: Loda Olbrich MRN: [id number] Date of Birth: [birth year]

## 2019-11-14 ENCOUNTER — Encounter: Payer: No Typology Code available for payment source | Admitting: Obstetrics and Gynecology

## 2019-11-16 ENCOUNTER — Ambulatory Visit: Payer: No Typology Code available for payment source

## 2019-11-23 ENCOUNTER — Ambulatory Visit: Payer: No Typology Code available for payment source

## 2019-11-23 ENCOUNTER — Other Ambulatory Visit: Payer: Self-pay

## 2019-11-23 DIAGNOSIS — M533 Sacrococcygeal disorders, not elsewhere classified: Secondary | ICD-10-CM

## 2019-11-23 DIAGNOSIS — M62838 Other muscle spasm: Secondary | ICD-10-CM

## 2019-11-23 DIAGNOSIS — M21372 Foot drop, left foot: Secondary | ICD-10-CM

## 2019-11-23 NOTE — Therapy (Addendum)
[place] MAIN [facility] SERVICES [address] [place], [place], [address] Phone: [phone]   Fax:  [phone]  Physical Therapy Treatment  The patient has been informed of current processes in place at Outpatient Rehab to protect patients from Covid-19 exposure including social distancing, schedule modifications, and new cleaning procedures. After discussing their particular risk with a therapist based on the patient's personal risk factors, the patient has decided to proceed with in-person therapy.   Patient Details  Name: Lisa Brady MRN: [id number] Date of Birth: August 23, 1986 Referring Provider (PT): [name]   Encounter Date: 11/23/2019  PT End of Session - 11/29/19 0929    Visit Number  20    Number of Visits  23    Date for PT Re-Evaluation  11/26/19    Authorization Type  [place]    Authorization - Visit Number  7    Authorization - Number of Visits  10    PT Start Time  0800    PT Stop Time  0908    PT Time Calculation (min)  68 min    Activity Tolerance  Patient tolerated treatment well;No increased pain    Behavior During Therapy  WFL for tasks assessed/performed       Past Medical History:  Diagnosis Date  . Foot drop     Past Surgical History:  Procedure Laterality Date  . WISDOM TOOTH EXTRACTION  2006    There were no vitals filed for this visit.     Pelvic Floor Physical Therapy Treatment Note  SCREENING  Changes in medications, allergies, or medical history?: none    SUBJECTIVE  Patient reports: Is doing "ok" her back felt great for a full week after last visit but has been achy again from carrying her baby in front of her facing out. Has started having what feels like menstrual cramps. Has been able to do kegels at ~ 30 degrees, not at ~ 60 degrees. Had a lot of urgency 2 days last week. Had 2 small accidents as well. Is planning on trying the impressa, bought the smallest size. Has been  having yellow discharge. Has been using the tennis ball at home to do release. Had been constipated heading into last weekend.    Precautions:  20 weeks PP  Pain update:  Location of pain:  Low back/iliac crests Current pain:  0/10  Max pain:  2/10 NPS intermittently  Least pain:  0/10 Nature of pain: deep ache    Patient Goals: Control her bladder/not need depends.   OBJECTIVE  Changes in:   MMT LE  - L DF 4+/5 (different rater may account for difference but could be due to spasms in back since test performed prior to DN/manual) - R DF 5/5 (from prior session)  Observation:  Decreased rounded/forward head posture.   Pelvic floor: TTP through all regions on L, R not assessed today due to time constraint. Pt. Able to generate sufficient contraction to maintain probe placement in supine and with up to 30 degrees of HOB elevation.   Palpation:  TTP to erector spinae and multifidus on L at T11, L1, and B at L5 (from prior session)  Gait Analysis: (from prior session) Gait speed: 1.33m/s ; anterior pelvic tilt, knee hyperextension, heel strike - with cues pelvic neutral achieved, with appropriate arm swing and decreased heel strike.   Patient Outcome Measure: N/A   INTERVENTIONS THIS SESSION: Manual Therapy: Performed TP release internally to IC, PR, OI on  L to decrease spasm and pain and allow for improved balance of musculature for improved function and decreased symptoms.  NM re-ed: Gave tactile, verbal, and visual feedback and cues to improve Pt. Ability to sense and contract her PFM, working on being able to use exhale to begin PFM contraction but then to be able to dissociate and breathe normally while holding contraction. Pt. Able to maintain ~ 80% of maximal contraction for 15 seconds one time, 8-10 seconds with repetition. She continues to demonstrate decreased force after ~ 5 repetitions and fatigued by ~ 4 sets (of 10) of quick-flicks and 3 sets of long-holds (6-10  sec.Marland Kitchen    Biofeedback: Used internal vaginal sensor as visual and auditory feedback to allow for improved proprioception and coordination of the PFM to allow Pt. To continue building on strength for improved function and decreased SUI.   Self-care: encouraged to do pelvic-tilts with kegel following BM and before kegels to decrease impact of prolapse on ability to contract PFM.  Total time: 68 min.                             PT Short Term Goals - 09/17/19 1649      PT SHORT TERM GOAL #1   Title  Patient will demonstrate improved pelvic alignment and balance of musculature surrounding the pelvis to facilitate decreased PFM spasms and decrease pressure on nerve roots to allow for optimal PFM function    Baseline  Has L foot-drop and pain in L glutes/knee as well as inability to sense PFM and L posterior innominate rotation/spasms surrounding pelvis.    Time  5    Period  Weeks    Status  On-going    Target Date  11/26/19      PT SHORT TERM GOAL #2   Title  Patient will demonstrate HEP x1 in the clinic to demonstrate understanding and proper form to allow for further improvement.    Baseline  Pt. lacks knowledge of therapeutic ecersise that will decrease Sx.    Time  5    Period  Weeks    Status  Achieved    Target Date  11/26/19      PT SHORT TERM GOAL #3   Title  Pt. will be able to walk 250 Ft.with CGA and w/o assistive device to demonstrate improved balance/decreased instability and foot drop.    Baseline  Pt. using RW to AMB due to L foot-drop and increased instability.    Time  5    Period  Weeks    Status  Achieved    Target Date  08/20/19      PT SHORT TERM GOAL #4   Title  Patient will demonstrate a coordinated contraction, relaxation, and bulge of the pelvic floor muscles to demonstrate functional recruitment and motion and allow for further strengthening.    Baseline  Pt. reports inability to sense the PFM for kegel or relaxation, having mixed UI     Time  5    Period  Weeks    Status  Achieved    Target Date  08/20/19        PT Long Term Goals - 10/19/19 1157      PT LONG TERM GOAL #1   Title  Patient will report no episodes of UUI/SUI over the course of the prior two weeks to demonstrate improved functional ability.    Baseline  Having UUI at toiletside, h/o mild  SUI before and during pregnancy    Time  10    Period  Weeks    Status  On-going    Target Date  11/26/19      PT LONG TERM GOAL #2   Title  Patient will report no pain with intercourse to demonstrate improved functional ability.    Baseline  having mild pain with initial penetration    Time  10    Period  Weeks    Status  Unable to assess    Target Date  11/26/19      PT LONG TERM GOAL #3   Title  Patient will score at or below 22/100 on the UDI and 27/100 on the UIQ to demonstrate a clinically meaningful decrease in disability and distress due to pelvic floor dysfunction.    Baseline  UDI: 67, UIQ: 72/100 10-19-19: UDI 55.5, UIQ 76/100; POPDI 11/100    Time  10    Period  Weeks    Status  On-going    Target Date  11/26/19      PT LONG TERM GOAL #4   Title  Pt. 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    Baseline  Pt. requiring RW for AMB due to L foot-drop and imbalance. As of 10/12: Pt. requiring some support (e.g. grocery cart) for > 20 min. of AMB or stairs.    Time  10    Period  Weeks    Status  On-going    Target Date  11/26/19            Plan - 11/29/19 0930    Clinical Impression Statement  Pt. Responded well to all interventions today, demonstrating decreased PFM spsms on the L and improved ability to recruit PFM at ~ 30 degrees or incline with similar force as in supine as well as understanding and correct performance of all education and exercises provided today. They will continue to benefit from skilled physical therapy to  work toward remaining goals and maximize function as well as decrease likelihood of symptom increase or recurrence.     PT Next Visit Plan  biofeedback, stabilize gait pattern; treat spinal mobility to improve diaphragmcoordination and reduce L LE radiating symptoms; TP release vs. DN to, hip-flexors, and R pectineus. re-assess for Piriformis spasm (have performed DN and TP release now); check in with long term HEP performance    PT Home Exercise Plan  added end of bed hip flexor to replace tennis ball to hip flexors. A and B days: A- ankle exercises (swap theraband dorsiflexion with heel walking, 3 sets of 12 steps (or until fatigue or compensations occurs) , calf stretch, hip flexor stretch, 3 way wall stretch, side stretch, calf raises, Dorthy's B- mini-marches (seated pelvic tilts while at work), front planks, bow and arrow, scapular retractions, mid shoulder strengthening (with theraband) Every day- Kegels, side planks PRN- K-tape at SIJ; tennis ball release    Consulted and Agree with Plan of Care  Patient       Patient will benefit from skilled therapeutic intervention in order to improve the following deficits and impairments:     Visit Diagnosis: Other muscle spasm  Foot drop, left  Sacrococcygeal disorders, not elsewhere classified     Problem List Patient Active Problem List   Diagnosis Date Noted  . Urinary incontinence 08/17/2019  . Foot drop, left 08/17/2019  .  Maternal varicella, non-immune 11/20/2018   Willa Rough DPT, ATC Willa Rough 11/29/2019, 9:51 AM  [place] MAIN [facility] SERVICES [address] [place], [place], [address] Phone: [phone]   Fax:  [phone]  Name: Hesper Wimbush MRN: [id number] Date of Birth: 09/06/1986

## 2019-12-03 ENCOUNTER — Encounter: Payer: Self-pay | Admitting: Urology

## 2019-12-03 ENCOUNTER — Ambulatory Visit (INDEPENDENT_AMBULATORY_CARE_PROVIDER_SITE_OTHER): Payer: No Typology Code available for payment source | Admitting: Urology

## 2019-12-03 ENCOUNTER — Other Ambulatory Visit: Payer: Self-pay

## 2019-12-03 VITALS — BP 106/70 | HR 116 | Ht 63.0 in | Wt 123.0 lb

## 2019-12-03 DIAGNOSIS — N3946 Mixed incontinence: Secondary | ICD-10-CM | POA: Diagnosis not present

## 2019-12-03 NOTE — Progress Notes (Signed)
12/03/2019 3:28 PM   Lisa Brady 21-Mar-1986 [id number]  Referring provider: Elby Beck, [place],  [place] [address]  No chief complaint on file.   HPI: I was consulted to assess the patient urinary incontinence beginning since her 29-month-old was born.  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  She has normal vaginal rectal sensation but she said it is hard to engage her pelvic muscles and is working with physical therapy.  She said her incontinence has improved but then baselined recently.  She reports a faster than normal stream  She can leak with coughing sneezing and with bending and lifting.  Twice a week she has urge incontinence but running water is a trigger.  No bedwetting.  1 pad a day that is damp.  Prior to pregnancy had occasional stress incontinence not wearing a pad.  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  No prolapse.  Tissues looked a little bit congested of the anterior vaginal wall.  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  She has a faster flow.  The pathophysiology and role and timing of urodynamics discussed.  Based upon her age urethral injectable might be a good option in the future but hopefully with time things will settle down.  Picture was drawn regarding mild urethral prolapse and large meatus and possible role of Foley balloon trauma.  If she ever had treatment for stress incontinence I would perform cystoscopy prior.  For safety reasons it would also be reasonable to do urodynamics in about 3 months because of the potential of a neurogenic bladder.  Urgency does affect her quality life and she is trying to get back to work.  We gave her Myrbetriq  25 mg samples and a 50 mg prescription.  We will order the urodynamics in 2 months to get baseline bladder pressures on assessment.  Then she will follow-up in clinic.  She understands the concept of a urethral injectable if we need to treat the stress components and she is hoping to have children in the future.  She knows that generally I would wait at least a year but it depends upon her quality life and needs.  Day Frequency stable.  Patient thinks her continence is much better with physical therapy and time.  Never took medication.  She was having spotting for first 4 months after delivery.  Now she has intermittent yellow discharge up to month 5.  She wears a pad because of the discharge.  She is not certain if she is leaking urine at all.  Physical therapy commented on urethral swelling.  Her urgency incontinence is also very much improved.  She did have a Foley catheter pulled out with a Foley balloon during pregnancy.  Last examination cause local irritation so I barely inserted the speculum into the introitus.  Her urethral meatus looked basically normal.  She had a little bit of vaginal wall elevation underneath the mid urethra with hypermobility with elasticity and spring back all within normal limits.  No stress incontinence    PMH: Past Medical History:  Diagnosis Date  . Foot drop     Surgical History: Past Surgical History:  Procedure Laterality  Date  . WISDOM TOOTH EXTRACTION  2006    Home Medications:  Allergies as of 12/03/2019   No Known Allergies     Medication List       Accurate as of December 03, 2019  3:28 PM. If you have any questions, ask your nurse or doctor.        erythromycin ophthalmic ointment Place 1 application into the right eye 3 (three) times daily. For 7 to 10 days.   mirabegron ER 50 MG Tb24 tablet Commonly known as: MYRBETRIQ Take 1 tablet (50 mg total) by mouth daily.   Norethindrone Acetate-Ethinyl Estradiol 1.5-30 MG-MCG tablet  Commonly known as: LOESTRIN Take 1 tablet by mouth daily.   prenatal multivitamin Tabs tablet Take 1 tablet by mouth daily at 12 noon.       Allergies: No Known Allergies  Family History: Family History  Problem Relation Age of Onset  . Cancer Mother   . Breast cancer Mother   . Melanoma Mother   . Hyperlipidemia Father   . Hypertension Father   . Hyperlipidemia Brother   . Cancer Maternal Grandmother   . Brain cancer Maternal Grandmother   . Heart disease Maternal Grandfather   . Cancer Paternal Grandmother   . Lung cancer Paternal Grandmother   . Cancer Paternal Grandfather   . Lung cancer Paternal Grandfather   . Hyperlipidemia Brother   . Hyperlipidemia Brother   . Hyperlipidemia Brother     Social History:  reports that she has never smoked. She has never used smokeless tobacco. She reports previous alcohol use. She reports that she does not use drugs.  ROS:                                        Physical Exam: There were no vitals taken for this visit.  Constitutional:  Alert and oriented, No acute distress.   Laboratory Data: Lab Results  Component Value Date   WBC 15.2 (H) 07/06/2019   HGB 9.6 (L) 07/06/2019   HCT 29.0 (L) 07/06/2019   MCV 87.1 07/06/2019   PLT 170 07/06/2019    No results found for: CREATININE  No results found for: PSA  No results found for: TESTOSTERONE  No results found for: HGBA1C  Urinalysis    Component Value Date/Time   APPEARANCEUR Clear 09/10/2019 1502   GLUCOSEU Negative 09/10/2019 1502   BILIRUBINUR Negative 09/10/2019 1502   PROTEINUR Negative 09/10/2019 1502   UROBILINOGEN 0.2 07/27/2019 0907   NITRITE Negative 09/10/2019 1502   LEUKOCYTESUR 1+ (A) 09/10/2019 1502    Pertinent Imaging:   Assessment & Plan: Reassurance given.  Discharge not due to urethral findings.  I will see her as needed.  Picture drawn  There are no diagnoses linked to this encounter.  No follow-ups on  file.  Reece Packer, MD  [facility] [address], [place], [place] [address] [phone]

## 2019-12-03 NOTE — Addendum Note (Signed)
Addended by: Verlene Mayer A on: 12/03/2019 04:05 PM   Modules accepted: Orders

## 2019-12-04 LAB — MICROSCOPIC EXAMINATION: Bacteria, UA: NONE SEEN

## 2019-12-04 LAB — URINALYSIS, COMPLETE
Bilirubin, UA: NEGATIVE
Glucose, UA: NEGATIVE
Ketones, UA: NEGATIVE
Nitrite, UA: NEGATIVE
Protein,UA: NEGATIVE
Specific Gravity, UA: 1.015 (ref 1.005–1.030)
Urobilinogen, Ur: 0.2 mg/dL (ref 0.2–1.0)
pH, UA: 6.5 (ref 5.0–7.5)

## 2019-12-21 ENCOUNTER — Other Ambulatory Visit: Payer: Self-pay

## 2019-12-21 ENCOUNTER — Ambulatory Visit: Payer: No Typology Code available for payment source | Attending: Obstetrics and Gynecology

## 2019-12-21 DIAGNOSIS — M62838 Other muscle spasm: Secondary | ICD-10-CM | POA: Insufficient documentation

## 2019-12-21 DIAGNOSIS — M21372 Foot drop, left foot: Secondary | ICD-10-CM | POA: Diagnosis present

## 2019-12-21 DIAGNOSIS — M533 Sacrococcygeal disorders, not elsewhere classified: Secondary | ICD-10-CM | POA: Diagnosis present

## 2019-12-21 NOTE — Patient Instructions (Signed)
Access Code: [email or web address]  URL: [email or web address]  Date: 12/21/2019  Prepared by: Letitia Libra   Exercises  Supine Heel Slides - 10 reps - 3 sets - 1x daily - 7x weekly  Quadruped Pelvic Floor Contraction with Weight Shift Forward Backward - 10 reps - 1x daily - 7x weekly

## 2019-12-21 NOTE — Therapy (Addendum)
[place] [facility]IN [facility] SERVICES [address] [place], [place], [address] Phone: [phone]   Fax:  [phone]  Physical Therapy Treatment  The patient has been informed of current processes in place at Outpatient Rehab to protect patients from Covid-19 exposure including social distancing, schedule modifications, and new cleaning procedures. After discussing their particular risk with a therapist based on the patient's personal risk factors, the patient has decided to proceed with in-person therapy.   Patient Details  Name: Lisa Brady MRN: [id number] Date of Birth: 04-Jul-1986 Referring Provider (PT): [name]   Encounter Date: 12/21/2019  PT End of Session - 12/28/19 0757    Visit Number  21    Number of Visits  23    Date for PT Re-Evaluation  11/26/19    Authorization - Visit Number  8    Authorization - Number of Visits  10    PT Start Time  0800    PT Stop Time  0900    PT Time Calculation (min)  60 min    Activity Tolerance  Patient tolerated treatment well;No increased pain    Behavior During Therapy  WFL for tasks assessed/performed       Past Medical History:  Diagnosis Date  . Foot drop     Past Surgical History:  Procedure Laterality Date  . WISDOM TOOTH EXTRACTION  2006    There were no vitals filed for this visit.    Pelvic Floor Physical Therapy Treatment Note  SCREENING  Changes in medications, allergies, or medical history?: none    SUBJECTIVE  Patient reports: She had covid, her son and Husband also got Covid and her son sustained a head injury so she has had a very stressful couple weeks. Was having some leakage with coughing and sneezing with Covid mostly was bad on one day. ~ 25% leakage with cough/sneeze. Had 2 accidents the last week of December on the same day where she had almost full bladder emptying but had had 2 weeks before that with very little to no leakage. Over the past week has  had a little urgency but no more full "accidents".Has had back pain that was travelling around her back but hard to discern body aches vs. Spasms. Tried releasing pain in the upper back but could not get any relief. Having LBP today but could be connected to her menstrual cycle. R knee has been hurting some today. Feels like she can feel the kegels a lot better. She can do kegels at 30 deg. Easily and can do a few at 45 degrees now.    Precautions:  5.5 months PP  Pain update:  Location of pain:  Low back/iliac crests Current pain:  0/10  Max pain:  2/10 NPS intermittently  Least pain:  0/10 Nature of pain: deep ache    Patient Goals: Control her bladder/not need depends.   OBJECTIVE  Changes in:   MMT LE  - L DF 4+/5 (different rater may account for difference but could be due to spasms in back since test performed prior to DN/manual) - R DF 5/5 (from prior session)  Observation:  Decreased rounded/forward head posture. R transverse processes and Costovertebral joints are more prominent through thoracic spine due to scoliosis.  Pelvic floor: TTP through all regions on L, R not assessed today due to time constraint. Pt. Able to generate sufficient contraction to maintain probe placement in supine and with up to 30 degrees of HOB elevation. (from prior  session)  ROM/Mobility:  Pt. Demonstrates decreased thoracic extension ROM and decreased PIVM through the upper thoracic spine and costovertebral joints.  Palpation:  TTP to erector spinae and multifidus on L at T11, L1, and B at L5 (from prior session)  Gait Analysis: (from prior session) Gait speed: 1.63m/s ; anterior pelvic tilt, knee hyperextension, heel strike - with cues pelvic neutral achieved, with appropriate arm swing and decreased heel strike.   Patient Outcome Measure: N/A   INTERVENTIONS THIS SESSION: Manual Therapy: Performed grade 2-4 PA mobs to thoracic spine, R costovertebral joints and R ribs to improve  mobility of joint and surrounding connective tissue and decrease pressure on nerve roots for improved conductivity and function of down-stream tissues. Performed TP release to R thoracic paraspinals and multifidus to decrease spasm and pain and allow for improved balance of musculature for improved function and decreased symptoms.   NM re-ed: Gave tactile, verbal, and visual feedback and cues to improve Pt. Ability to sense and contract her PFM, working on being able to use exhale to begin PFM contraction but then to be able to dissociate and breathe normally while holding contraction. Pt. Able to maintain ~ 80% of maximal contraction for 15 seconds one time, 8-10 seconds with repetition. She continues to demonstrate decreased force after ~ 5 repetitions and fatigued by ~ 4 sets (of 10) of quick-flicks and 3 sets of long-holds (6-10 sec.Marland Kitchen    Therex: reviewed mini-marces and upgraded to hovering heel-slides to improve deep-core strength and reviewed A/P shifts in quadruped to improve Pt. Performance, confidence, and efficacy of the exercise.  Total time: 60 min.                            PT Short Term Goals - 09/17/19 1649      PT SHORT TERM GOAL #1   Title  Patient will demonstrate improved pelvic alignment and balance of musculature surrounding the pelvis to facilitate decreased PFM spasms and decrease pressure on nerve roots to allow for optimal PFM function    Baseline  Has L foot-drop and pain in L glutes/knee as well as inability to sense PFM and L posterior innominate rotation/spasms surrounding pelvis.    Time  5    Period  Weeks    Status  On-going    Target Date  11/26/19      PT SHORT TERM GOAL #2   Title  Patient will demonstrate HEP x1 in the clinic to demonstrate understanding and proper form to allow for further improvement.    Baseline  Pt. lacks knowledge of therapeutic ecersise that will decrease Sx.    Time  5    Period  Weeks    Status  Achieved     Target Date  11/26/19      PT SHORT TERM GOAL #3   Title  Pt. will be able to walk 250 Ft.with CGA and w/o assistive device to demonstrate improved balance/decreased instability and foot drop.    Baseline  Pt. using RW to AMB due to L foot-drop and increased instability.    Time  5    Period  Weeks    Status  Achieved    Target Date  08/20/19      PT SHORT TERM GOAL #4   Title  Patient will demonstrate a coordinated contraction, relaxation, and bulge of the pelvic floor muscles to demonstrate functional recruitment and motion and allow for further strengthening.  Baseline  Pt. reports inability to sense the PFM for kegel or relaxation, having mixed UI    Time  5    Period  Weeks    Status  Achieved    Target Date  08/20/19        PT Long Term Goals - 10/19/19 1157      PT LONG TERM GOAL #1   Title  Patient will report no episodes of UUI/SUI over the course of the prior two weeks to demonstrate improved functional ability.    Baseline  Having UUI at toiletside, h/o mild SUI before and during pregnancy    Time  10    Period  Weeks    Status  On-going    Target Date  11/26/19      PT LONG TERM GOAL #2   Title  Patient will report no pain with intercourse to demonstrate improved functional ability.    Baseline  having mild pain with initial penetration    Time  10    Period  Weeks    Status  Unable to assess    Target Date  11/26/19      PT LONG TERM GOAL #3   Title  Patient will score at or below 22/100 on the UDI and 27/100 on the UIQ to demonstrate a clinically meaningful decrease in disability and distress due to pelvic floor dysfunction.    Baseline  UDI: 67, UIQ: 72/100 10-19-19: UDI 55.5, UIQ 76/100; POPDI 11/100    Time  10    Period  Weeks    Status  On-going    Target Date  11/26/19      PT LONG TERM GOAL #4   Title  Pt. will demonstrate appropriate gait mechanics and be able to perform ADL's without use of AD and without pain to allow for child-care and  return to work.    Baseline  Pt. requiring RW for AMB due to L foot-drop and imbalance. As of 10/12: Pt. requiring some support (e.g. grocery cart) for > 20 min. of AMB or stairs.    Time  10    Period  Weeks    Status  On-going    Target Date  11/26/19            Plan - 12/28/19 0757    Clinical Impression Statement  Pt. Responded well to all interventions today, demonstrating improved thoracolumbar mobility and decreased spasms, as well as understanding and correct performance of all education and exercises provided today. They will continue to benefit from skilled physical therapy to work toward remaining goals and maximize function as well as decrease likelihood of symptom increase or recurrence.     PT Next Visit Plan  biofeedback, stabilize gait pattern; treat spinal mobility to improve diaphragmcoordination and reduce L LE radiating symptoms; TP release vs. DN to, hip-flexors, and R pectineus. re-assess for Piriformis spasm (have performed DN and TP release now); check in with long term HEP performance    PT Home Exercise Plan  added end of bed hip flexor to replace tennis ball to hip flexors. A and B days: A- ankle exercises (swap theraband dorsiflexion with heel walking, 3 sets of 12 steps (or until fatigue or compensations occurs) , calf stretch, hip flexor stretch, 3 way wall stretch, side stretch, calf raises, Dorthy's B- mini-marches (seated pelvic tilts while at work), front planks, bow and arrow, scapular retractions, mid shoulder strengthening (with theraband) Every day- Kegels, side planks PRN- K-tape at SIJ; tennis ball  release    Consulted and Agree with Plan of Care  Patient       Patient will benefit from skilled therapeutic intervention in order to improve the following deficits and impairments:     Visit Diagnosis: Other muscle spasm  Foot drop, left  Sacrococcygeal disorders, not elsewhere classified     Problem List Patient Active Problem List   Diagnosis  Date Noted  . Urinary incontinence 08/17/2019  . Foot drop, left 08/17/2019  . Maternal varicella, non-immune 11/20/2018   Willa Rough DPT, ATC Willa Rough 12/28/2019, 8:03 AM  [place] [facility] SERVICES [address] [place], [place], [address] Phone: [phone]   Fax:  [phone]  Name: Kinslei Raether MRN: [id number] Date of Birth: 01/08/1986

## 2019-12-26 ENCOUNTER — Encounter: Payer: Self-pay | Admitting: Family Medicine

## 2019-12-26 ENCOUNTER — Ambulatory Visit (INDEPENDENT_AMBULATORY_CARE_PROVIDER_SITE_OTHER): Payer: No Typology Code available for payment source | Admitting: Family Medicine

## 2019-12-26 VITALS — BP 102/60 | HR 79 | Ht 63.0 in | Wt 128.0 lb

## 2019-12-26 DIAGNOSIS — Z3041 Encounter for surveillance of contraceptive pills: Secondary | ICD-10-CM

## 2019-12-26 DIAGNOSIS — Z0000 Encounter for general adult medical examination without abnormal findings: Secondary | ICD-10-CM

## 2019-12-26 MED ORDER — NORETHINDRONE ACET-ETHINYL EST 1.5-30 MG-MCG PO TABS: 1.0000 | ORAL_TABLET | Freq: Every day | ORAL | 4 refills | Status: DC

## 2019-12-26 MED ORDER — PRENATAL MULTIVITAMIN CH: 1.0000 | ORAL_TABLET | Freq: Every day | ORAL | 3 refills | Status: DC

## 2019-12-26 NOTE — Patient Instructions (Signed)
Health Maintenance, Female Adopting a healthy lifestyle and getting preventive care are important in promoting health and wellness. Ask your health care provider about:  The right schedule for you to have regular tests and exams.  Things you can do on your own to prevent diseases and keep yourself healthy. What should I know about diet, weight, and exercise? Eat a healthy diet   Eat a diet that includes plenty of vegetables, fruits, low-fat dairy products, and lean protein.  Do not eat a lot of foods that are high in solid fats, added sugars, or sodium. Maintain a healthy weight Body mass index (BMI) is used to identify weight problems. It estimates body fat based on height and weight. Your health care provider can help determine your BMI and help you achieve or maintain a healthy weight. Get regular exercise Get regular exercise. This is one of the most important things you can do for your health. Most adults should:  Exercise for at least 150 minutes each week. The exercise should increase your heart rate and make you sweat (moderate-intensity exercise).  Do strengthening exercises at least twice a week. This is in addition to the moderate-intensity exercise.  Spend less time sitting. Even light physical activity can be beneficial. Watch cholesterol and blood lipids Have your blood tested for lipids and cholesterol at 36 years of age, then have this test every 5 years. Have your cholesterol levels checked more often if:  Your lipid or cholesterol levels are high.  You are older than 36 years of age.  You are at high risk for heart disease. What should I know about cancer screening? Depending on your health history and family history, you may need to have cancer screening at various ages. This may include screening for:  Breast cancer.  Cervical cancer.  Colorectal cancer.  Skin cancer.  Lung cancer. What should I know about heart disease, diabetes, and high blood  pressure? Blood pressure and heart disease  High blood pressure causes heart disease and increases the risk of stroke. This is more likely to develop in people who have high blood pressure readings, are of African descent, or are overweight.  Have your blood pressure checked: ? Every 3-5 years if you are 18-39 years of age. ? Every year if you are 40 years old or older. Diabetes Have regular diabetes screenings. This checks your fasting blood sugar level. Have the screening done:  Once every three years after age 40 if you are at a normal weight and have a low risk for diabetes.  More often and at a younger age if you are overweight or have a high risk for diabetes. What should I know about preventing infection? Hepatitis B If you have a higher risk for hepatitis B, you should be screened for this virus. Talk with your health care provider to find out if you are at risk for hepatitis B infection. Hepatitis C Testing is recommended for:  Everyone born from 1945 through 1965.  Anyone with known risk factors for hepatitis C. Sexually transmitted infections (STIs)  Get screened for STIs, including gonorrhea and chlamydia, if: ? You are sexually active and are younger than 36 years of age. ? You are older than 36 years of age and your health care provider tells you that you are at risk for this type of infection. ? Your sexual activity has changed since you were last screened, and you are at increased risk for chlamydia or gonorrhea. Ask your health care provider if   you are at risk.  Ask your health care provider about whether you are at high risk for HIV. Your health care provider may recommend a prescription medicine to help prevent HIV infection. If you choose to take medicine to prevent HIV, you should first get tested for HIV. You should then be tested every 3 months for as long as you are taking the medicine. Pregnancy  If you are about to stop having your period (premenopausal) and  you may become pregnant, seek counseling before you get pregnant.  Take 400 to 800 micrograms (mcg) of folic acid every day if you become pregnant.  Ask for birth control (contraception) if you want to prevent pregnancy. Osteoporosis and menopause Osteoporosis is a disease in which the bones lose minerals and strength with aging. This can result in bone fractures. If you are 65 years old or older, or if you are at risk for osteoporosis and fractures, ask your health care provider if you should:  Be screened for bone loss.  Take a calcium or vitamin D supplement to lower your risk of fractures.  Be given hormone replacement therapy (HRT) to treat symptoms of menopause. Follow these instructions at home: Lifestyle  Do not use any products that contain nicotine or tobacco, such as cigarettes, e-cigarettes, and chewing tobacco. If you need help quitting, ask your health care provider.  Do not use street drugs.  Do not share needles.  Ask your health care provider for help if you need support or information about quitting drugs. Alcohol use  Do not drink alcohol if: ? Your health care provider tells you not to drink. ? You are pregnant, may be pregnant, or are planning to become pregnant.  If you drink alcohol: ? Limit how much you use to 0-1 drink a day. ? Limit intake if you are breastfeeding.  Be aware of how much alcohol is in your drink. In the [place] one drink equals one 12 oz bottle of beer (355 mL), one 5 oz glass of wine (148 mL), or one 1 oz glass of hard liquor (44 mL). General instructions  Schedule regular health, dental, and eye exams.  Stay current with your vaccines.  Tell your health care provider if: ? You often feel depressed. ? You have ever been abused or do not feel safe at home. Summary  Adopting a healthy lifestyle and getting preventive care are important in promoting health and wellness.  Follow your health care provider's instructions about healthy  diet, exercising, and getting tested or screened for diseases.  Follow your health care provider's instructions on monitoring your cholesterol and blood pressure. This information is not intended to replace advice given to you by your health care provider. Make sure you discuss any questions you have with your health care provider. Document Revised: 11/15/2018 Document Reviewed: 11/15/2018 Elsevier Patient Education  2020 [employer]  

## 2019-12-26 NOTE — Progress Notes (Signed)
Subjective:    Patient ID: Lisa Brady, female    DOB: Mar 31, 1986, 34 y.o.   MRN: [id number]  HPI Chief Complaint  Patient presents with  . Annual Exam  . Medication Refill    prenatal    This is a 34 yo female who presents today for CPE. Has been doing ok. She and her family recently had Covid and have recovered.   Last CPE- recent ob care, has 5 month old Pap- up to date, gyn Tdap- 03/23/2019 Flu- annual Eye- overdue, last approx. 3 years ago, wears glasses Dental- regular Exercise- walking  Past Medical History:  Diagnosis Date  . Foot drop    Past Surgical History:  Procedure Laterality Date  . WISDOM TOOTH EXTRACTION  2006   Family History  Problem Relation Age of Onset  . Cancer Mother   . Breast cancer Mother   . Melanoma Mother   . Hyperlipidemia Father   . Hypertension Father   . Hyperlipidemia Brother   . Cancer Maternal Grandmother   . Brain cancer Maternal Grandmother   . Heart disease Maternal Grandfather   . Cancer Paternal Grandmother   . Lung cancer Paternal Grandmother   . Cancer Paternal Grandfather   . Lung cancer Paternal Grandfather   . Hyperlipidemia Brother   . Hyperlipidemia Brother   . Hyperlipidemia Brother    Social History   Tobacco Use  . Smoking status: Never Smoker  . Smokeless tobacco: Never Used  Substance Use Topics  . Alcohol use: Not Currently  . Drug use: Never      Review of Systems  Constitutional: Positive for malaise/fatigue (thinks related to poor sleep with infant, recent Covid virus).  HENT: Negative.   Eyes: Negative.   Respiratory: Negative.   Cardiovascular: Negative.   Gastrointestinal: Negative.   Genitourinary: Positive for urgency.       Some urgency and incontinence following child birth. Attending pelvic floor PT. Some improvement.  Break through bleeding intermittently with OCPs, takes continuously.   Musculoskeletal: Positive for back pain.  Skin: Negative.   Neurological: Negative.    Psychiatric/Behavioral: Negative for depression.       Sleep disrupted due to infant.    Review of Systems  Constitutional: Positive for malaise/fatigue (thinks related to poor sleep with infant, recent Covid virus).  HENT: Negative.   Eyes: Negative.   Respiratory: Negative.   Cardiovascular: Negative.   Gastrointestinal: Negative.   Genitourinary: Positive for urgency.       Some urgency and incontinence following child birth. Attending pelvic floor PT. Some improvement.  Break through bleeding intermittently with OCPs, takes continuously.   Musculoskeletal: Positive for back pain.  Skin: Negative.   Neurological: Negative.   Psychiatric/Behavioral: Negative for depression.       Sleep disrupted due to infant.        Objective:   Physical Exam Vitals reviewed.  Constitutional:      General: She is not in acute distress.    Appearance: Normal appearance. She is normal weight. She is not ill-appearing, toxic-appearing or diaphoretic.  HENT:     Head: Normocephalic and atraumatic.     Right Ear: External ear normal.     Left Ear: External ear normal.  Eyes:     Conjunctiva/sclera: Conjunctivae normal.  Cardiovascular:     Rate and Rhythm: Normal rate and regular rhythm.     Heart sounds: Normal heart sounds.  Pulmonary:     Effort: Pulmonary effort is normal.  Breath sounds: Normal breath sounds.  Abdominal:     General: Abdomen is flat. There is no distension.     Palpations: Abdomen is soft. There is no mass.     Tenderness: There is no abdominal tenderness. There is no guarding or rebound.     Hernia: No hernia is present.  Musculoskeletal:     Cervical back: Normal range of motion and neck supple.     Right lower leg: No edema.     Left lower leg: No edema.  Skin:    General: Skin is warm and dry.  Neurological:     Mental Status: She is alert and oriented to person, place, and time.  Psychiatric:        Mood and Affect: Mood normal.        Behavior:  Behavior normal.        Thought Content: Thought content normal.        Judgment: Judgment normal.       BP 102/60   Pulse 79   Ht 5\' 3"  (1.6 m)   Wt 128 lb (58.1 kg)   SpO2 99%   Breastfeeding No   BMI 22.67 kg/m  Wt Readings from Last 3 Encounters:  12/26/19 128 lb (58.1 kg)  12/03/19 123 lb (55.8 kg)  10/26/19 125 lb 6.4 oz (56.9 kg)   Depression screen [facility] 2/9 10/26/2019 08/17/2019  Decreased Interest 0 0  Down, Depressed, Hopeless 0 1  PHQ - 2 Score 0 1  Altered sleeping - 0  Tired, decreased energy - 1  Change in appetite - 0  Feeling bad or failure about yourself  - 1  Trouble concentrating - 0  Moving slowly or fidgety/restless - 0  Suicidal thoughts - 0  PHQ-9 Score - 3  Difficult doing work/chores - Somewhat difficult       Assessment & Plan:  1. Annual physical exam - Discussed and encouraged healthy lifestyle choices- adequate sleep, regular exercise, stress management and healthy food choices.   2. Uses oral contraception - Norethindrone Acetate-Ethinyl Estradiol (LOESTRIN) 1.5-30 MG-MCG tablet; Take 1 tablet by mouth daily. Take active pill continuously, no placebo.  Dispense: 4 Package; Refill: 4  This visit occurred during the SARS-CoV-2 public health emergency.  Safety protocols were in place, including screening questions prior to the visit, additional usage of staff PPE, and extensive cleaning of exam room while observing appropriate contact time as indicated for disinfecting solutions.    Clarene Reamer, FNP-BC  [facility]  12/26/2019 1:09 PM

## 2019-12-28 ENCOUNTER — Ambulatory Visit: Payer: No Typology Code available for payment source

## 2019-12-28 ENCOUNTER — Other Ambulatory Visit: Payer: Self-pay

## 2019-12-28 DIAGNOSIS — M533 Sacrococcygeal disorders, not elsewhere classified: Secondary | ICD-10-CM

## 2019-12-28 DIAGNOSIS — M62838 Other muscle spasm: Secondary | ICD-10-CM

## 2019-12-28 DIAGNOSIS — M21372 Foot drop, left foot: Secondary | ICD-10-CM

## 2019-12-28 NOTE — Patient Instructions (Addendum)
   Exhale and twist to the right as you apply overpressure on your knee. Inhale and release slightly then repeat 2x15.  (this replaces your "bow-and-arrow)    Chin-tuck then rotate left to the point of tightness/discomfort, inhale and back off just a little before rotating again. Do 2x15.

## 2019-12-28 NOTE — Therapy (Signed)
[place] [facility] SERVICES [address] [place], [place], [address] Phone: [phone]   Fax:  [phone]  Physical Therapy Treatment  The patient has been informed of current processes in place at Outpatient Rehab to protect patients from Covid-19 exposure including social distancing, schedule modifications, and new cleaning procedures. After discussing their particular risk with a therapist based on the patient's personal risk factors, the patient has decided to proceed with in-person therapy.   Patient Details  Name: Savannaha Gram MRN: [id number] Date of Birth: December 11, 1985 Referring Provider (PT): [name]   Encounter Date: 12/28/2019  PT End of Session - 12/28/19 0934    Visit Number  22    Number of Visits  23    Date for PT Re-Evaluation  11/26/19    Authorization Type  [employer]    Authorization - Visit Number  9    Authorization - Number of Visits  10    PT Start Time  0805    PT Stop Time  0905    PT Time Calculation (min)  60 min    Activity Tolerance  Patient tolerated treatment well;No increased pain    Behavior During Therapy  WFL for tasks assessed/performed       Past Medical History:  Diagnosis Date  . Foot drop     Past Surgical History:  Procedure Laterality Date  . WISDOM TOOTH EXTRACTION  2006    There were no vitals filed for this visit.    Pelvic Floor Physical Therapy Treatment Note  SCREENING  Changes in medications, allergies, or medical history?: none    SUBJECTIVE  Patient reports: Has had some urgency and constipation, has done exercises 3 times this week because she has been busier. She has a spot in the R>L QL that does not seem to release with tennis ball. It is better than it used to be but still feels intense when she stretches. Feels like she has regressed with her L single-leg calf raises. It started during when she had Covid but has persisted. Noticed on Wednesday that she  fatigued holding her son in her L arm for ~ 2 min. And it felt like it had been 30 min. Has chronic constipation intermittently. Had decreased her supplements when she had Covid for fear of diarrhea    Precautions:  5.5 months PP  Pain update:  Location of pain:  Low back/iliac crests R shoulder blade Current pain:  1/10  Max pain:  2/10 NPS intermittently  Least pain:  0/10 Nature of pain: achy/uncomfortable   Patient Goals: Control her bladder/not need depends.   OBJECTIVE  Changes in:   MMT LE  - L DF 4+/5 (different rater may account for difference but could be due to spasms in back since test performed prior to DN/manual) - R DF 5/5 (from prior session)  Observation:  Forward bend: Lumbar curve less prominent, most noticeable curvature at R lower thoracic region.  C2 deviated L with mild L cervical curve throughout and decreased L cervical ROM.   Strength:  Pre-treatment: L single leg calf raise endurance: 14 (started to fatigue at 6)  Post-treatment: L single leg calf raise endurance: 17 (started to fatigue at 10)  -Pt. Reports before she got sick she could do 2 sets of 15.  Pelvic floor: TTP through all regions on L, R not assessed today due to time constraint. Pt. Able to generate sufficient contraction to maintain probe placement in supine and with  up to 30 degrees of HOB elevation. (from prior session)  ROM/Mobility:  Pt. Able to demonstrate nearly full ROM with bow-and-arrow but decreased R rotation in seated thoracic twist.  Palpation:  TTP to L sub-occipitals and cervical extensors.  Gait Analysis: (from prior session) Gait speed: 1.65m/s ; anterior pelvic tilt, knee hyperextension, heel strike - with cues pelvic neutral achieved, with appropriate arm swing and decreased heel strike.   Patient Outcome Measure: N/A   INTERVENTIONS THIS SESSION: Manual Therapy: Performed TP release to L sub-occipitals and cervical extensors followed by grade 3-4 L to R  lateral mobs to upper and mid cervical spine and L  Cervical rotation  With min. Overpressure to to improve cervical mobility and alignment and decrease pressure on nerve roots for improved conductivity and function of down-stream tissues including motor fibers of L upper and lower extremity.    Therex: Educated on seated thoracic twist mobs to the R and L cervical rotational self-mobs to improve alignment and mobility of the spine and decrease pressure on nerve roots leading to distal structures to improve strength/recruitment and endurance for increased function.  Total time: 60 min.                           PT Short Term Goals - 09/17/19 1649      PT SHORT TERM GOAL #1   Title  Patient will demonstrate improved pelvic alignment and balance of musculature surrounding the pelvis to facilitate decreased PFM spasms and decrease pressure on nerve roots to allow for optimal PFM function    Baseline  Has L foot-drop and pain in L glutes/knee as well as inability to sense PFM and L posterior innominate rotation/spasms surrounding pelvis.    Time  5    Period  Weeks    Status  On-going    Target Date  11/26/19      PT SHORT TERM GOAL #2   Title  Patient will demonstrate HEP x1 in the clinic to demonstrate understanding and proper form to allow for further improvement.    Baseline  Pt. lacks knowledge of therapeutic ecersise that will decrease Sx.    Time  5    Period  Weeks    Status  Achieved    Target Date  11/26/19      PT SHORT TERM GOAL #3   Title  Pt. will be able to walk 250 Ft.with CGA and w/o assistive device to demonstrate improved balance/decreased instability and foot drop.    Baseline  Pt. using RW to AMB due to L foot-drop and increased instability.    Time  5    Period  Weeks    Status  Achieved    Target Date  08/20/19      PT SHORT TERM GOAL #4   Title  Patient will demonstrate a coordinated contraction, relaxation, and bulge of the pelvic floor  muscles to demonstrate functional recruitment and motion and allow for further strengthening.    Baseline  Pt. reports inability to sense the PFM for kegel or relaxation, having mixed UI    Time  5    Period  Weeks    Status  Achieved    Target Date  08/20/19        PT Long Term Goals - 10/19/19 1157      PT LONG TERM GOAL #1   Title  Patient will report no episodes of UUI/SUI over the course of the  prior two weeks to demonstrate improved functional ability.    Baseline  Having UUI at toiletside, h/o mild SUI before and during pregnancy    Time  10    Period  Weeks    Status  On-going    Target Date  11/26/19      PT LONG TERM GOAL #2   Title  Patient will report no pain with intercourse to demonstrate improved functional ability.    Baseline  having mild pain with initial penetration    Time  10    Period  Weeks    Status  Unable to assess    Target Date  11/26/19      PT LONG TERM GOAL #3   Title  Patient will score at or below 22/100 on the UDI and 27/100 on the UIQ to demonstrate a clinically meaningful decrease in disability and distress due to pelvic floor dysfunction.    Baseline  UDI: 67, UIQ: 72/100 10-19-19: UDI 55.5, UIQ 76/100; POPDI 11/100    Time  10    Period  Weeks    Status  On-going    Target Date  11/26/19      PT LONG TERM GOAL #4   Title  Pt. 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    Baseline  Pt. requiring RW for AMB due to L foot-drop and imbalance. As of 10/12: Pt. requiring some support (e.g. grocery cart) for > 20 min. of AMB or stairs.    Time  10    Period  Weeks    Status  On-going    Target Date  11/26/19            Plan - 12/28/19 0934    Clinical Impression Statement  Pt. Responded well to all interventions today, demonstrating improved cervical ROM and alignment, decreased spasms, increased L DF endurance, as well as  understanding and correct performance of all education and exercises provided today. They will continue to benefit from skilled physical therapy to work toward remaining goals and maximize function as well as decrease likelihood of symptom increase or recurrence.     PT Next Visit Plan  DN to QL and multifidus between T12-L4 , MFR? stabilize gait pattern; treat spinal mobility to improve diaphragmcoordination and reduce L LE radiating symptoms; TP release vs. DN to, hip-flexors, and R pectineus. re-assess for Piriformis spasm (have performed DN and TP release now); check in with long term HEP performance    PT Home Exercise Plan  added end of bed hip flexor to replace tennis ball to hip flexors. A and B days: A- ankle exercises (swap theraband dorsiflexion with heel walking, 3 sets of 12 steps (or until fatigue or compensations occurs) , calf stretch, hip flexor stretch, 3 way wall stretch, side stretch, calf raises, Dorthy's B- mini-marches (seated pelvic tilts while at work), front planks, bow and arrow, scapular retractions, mid shoulder strengthening (with theraband) Every day- Kegels, side planks PRN- K-tape at SIJ; tennis ball release, seated thoracic twists to the R, upper cervical rotation to the L    Consulted and Agree with Plan of Care  Patient       Patient will benefit from skilled therapeutic intervention in order to improve the following deficits and impairments:     Visit Diagnosis: Other muscle spasm  Foot drop, left  Sacrococcygeal disorders, not elsewhere classified  Problem List Patient Active Problem List   Diagnosis Date Noted  . Urinary incontinence 08/17/2019  . Foot drop, left 08/17/2019  . Maternal varicella, non-immune 11/20/2018   Willa Rough DPT, ATC Willa Rough 12/28/2019, 9:49 AM  [place] [facility] SERVICES [address] [place], [place], [address] Phone: [phone]   Fax:  [phone]  Name:  Marqui Dross MRN: [id number] Date of Birth: [birth year]

## 2020-01-04 ENCOUNTER — Other Ambulatory Visit: Payer: Self-pay

## 2020-01-04 ENCOUNTER — Ambulatory Visit: Payer: No Typology Code available for payment source

## 2020-01-04 DIAGNOSIS — M62838 Other muscle spasm: Secondary | ICD-10-CM

## 2020-01-04 DIAGNOSIS — M21372 Foot drop, left foot: Secondary | ICD-10-CM

## 2020-01-04 DIAGNOSIS — M533 Sacrococcygeal disorders, not elsewhere classified: Secondary | ICD-10-CM

## 2020-01-04 NOTE — Therapy (Addendum)
[place] MAIN [facility] SERVICES [address] [place], [place], [address] Phone: [phone]   Fax:  [phone]  Physical Therapy Treatment and Progress Note  Physical Therapy Progress Note   Dates of reporting period  09/25/19   to  01/04/20   The patient has been informed of current processes in place at [facility]patient Rehab to protect patients from Covid-19 exposure including social distancing, schedule modifications, and new cleaning procedures. After discussing their particular risk with a therapist based on the patient's personal risk factors, the patient has decided to proceed with in-person therapy.   Patient Details  Name: Lisa Brady MRN: [id number] Date of Birth: 1986-08-15 Referring Provider (PT): [name]   Encounter Date: 01/04/2020  PT End of Session - 01/07/20 0852    Visit Number  23    Number of Visits  23    Date for PT Re-Evaluation  11/26/19    Authorization Type  [employer]    Authorization - Visit Number  10    Authorization - Number of Visits  10    PT Start Time  0805    PT Stop Time  0905    PT Time Calculation (min)  60 min    Activity Tolerance  Patient tolerated treatment well;No increased pain    Behavior During Therapy  WFL for tasks assessed/performed       Past Medical History:  Diagnosis Date  . Foot drop     Past Surgical History:  Procedure Laterality Date  . WISDOM TOOTH EXTRACTION  2006    There were no vitals filed for this visit.   Pelvic Floor Physical Therapy Treatment Note  SCREENING  Changes in medications, allergies, or medical history?: none    SUBJECTIVE  Patient reports: Is still very frustrated that she is not able to get her second dose of the vaccine. Had a "sneeze leakage" and another leakage event that seemed unprovoked but was around the time she had been lifting and moving patio furniture. Has not really had a major accident recently. She gets a little yellow  staining on her panty liner that she is not feeling happen. Has multiple trigger-points in her back that she noted when her husband tried to give her a massage. Practiced doing kegels on her knees with the sensor to help improve her ability to recruit in standing. Is now able to be successful at 45 degrees most of the time, 30 is easy, and at ~ 60 degrees she needs a little support.  Now rather than urgency, she feels a "shift in her pelvis" and needs to reposition to feel less insecure.  R back/side is side is hurting. Doing her neck exercises   Precautions:  6 months PP  Pain update:  Location of pain:  Low back/iliac crests R shoulder blade Current pain:  1/10  Max pain:  2/10 NPS intermittently  Least pain:  0/10 Nature of pain: achy/uncomfortable  **following treatment no pain and decreased TTP.  Patient Goals: Control her bladder/not need depends.   OBJECTIVE  Changes in:   MMT LE  - L DF 4+/5 (different rater may account for difference but could be due to spasms in back since test performed prior to DN/manual) - R DF 5/5 (from prior session)  Observation:  Forward bend: Lumbar curve less prominent, most noticeable curvature at R lower thoracic region. (from prior session)  C2 deviated L with mild L cervical curve throughout and decreased L cervical ROM. (from  prior session)  Strength:  From prior session- [Pre-treatment: L single leg calf raise endurance: 14 (started to fatigue at 6)  Post-treatment: L single leg calf raise endurance: 17 (started to fatigue at 10)  -Pt. Reports before she got sick she could do 2 sets of 15.]   Pelvic floor: TTP through all regions on L, R not assessed today due to time constraint. Pt. Able to generate sufficient contraction to maintain probe placement in supine and with up to 30 degrees of HOB elevation. (from prior session)  ROM/Mobility:  Pt. Able to demonstrate nearly full ROM with bow-and-arrow but decreased R rotation in  seated thoracic twist.  Palpation:  TTP to L sub-occipitals and cervical extensors. (from prior session)  Gait Analysis: (from prior session) Gait speed: 1.31m/s ; anterior pelvic tilt, knee hyperextension, heel strike - with cues pelvic neutral achieved, with appropriate arm swing and decreased heel strike. (from prior session)  Patient Outcome Measure: POPDI: 6/100, UDI: 33/100, UIQ: 6/100.  INTERVENTIONS THIS SESSION: Manual Therapy: Performed MFR with use of cupping tool through thoracolumbar region with familiar pain reproduced with cupping and decreased following treatment.  Self-care: reviewed goals, outcome measures, and progress to determine appropriate POC.   Therex: Reviewed seated twist ROM as test-re-test and found improved ROM with decreased pain following treatment.  Total time: 60 min.                               PT Short Term Goals - 01/07/20 [id number]      PT SHORT TERM GOAL #1   Title  Patient will demonstrate improved pelvic alignment and balance of musculature surrounding the pelvis to facilitate decreased PFM spasms and decrease pressure on nerve roots to allow for optimal PFM function    Baseline  Has L foot-drop and pain in L glutes/knee as well as inability to sense PFM and L posterior innominate rotation/spasms surrounding pelvis.    Time  5    Period  Weeks    Status  Achieved    Target Date  11/26/19      PT SHORT TERM GOAL #2   Title  Patient will demonstrate HEP x1 in the clinic to demonstrate understanding and proper form to allow for further improvement.    Baseline  Pt. lacks knowledge of therapeutic ecersise that will decrease Sx.    Time  5    Period  Weeks    Status  Achieved    Target Date  11/26/19      PT SHORT TERM GOAL #3   Title  Pt. will be able to walk 250 Ft.with CGA and w/o assistive device to demonstrate improved balance/decreased instability and foot drop.    Baseline  Pt. using RW to AMB due to L  foot-drop and increased instability.    Time  5    Period  Weeks    Status  Achieved    Target Date  08/20/19      PT SHORT TERM GOAL #4   Title  Patient will demonstrate a coordinated contraction, relaxation, and bulge of the pelvic floor muscles to demonstrate functional recruitment and motion and allow for further strengthening.    Baseline  Pt. reports inability to sense the PFM for kegel or relaxation, having mixed UI    Time  5    Period  Weeks    Status  Achieved    Target Date  08/20/19  PT Long Term Goals - 01/07/20 [id number]      PT LONG TERM GOAL #1   Title  Patient will report no episodes of UUI/SUI over the course of the prior two weeks to demonstrate improved functional ability.    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    Time  10    Period  Weeks    Status  On-going    Target Date  03/14/20      PT LONG TERM GOAL #2   Title  Patient will report no pain with intercourse to demonstrate improved functional ability.    Baseline  having mild pain with initial penetration As of 1:29: had some pain at initial penetration and with deeper thrusting at ~ 3-4/10 but "not terrible"    Time  10    Period  Weeks    Status  On-going    Target Date  03/14/20      PT LONG TERM GOAL #3   Title  Patient will score at or below 22/100 on the UDI and 27/100 on the UIQ to demonstrate a clinically meaningful decrease in disability and distress due to pelvic floor dysfunction.    Baseline  UDI: 67, UIQ: 72/100 10-19-19: UDI 55.5, UIQ 76/100; POPDI 11/100 As of 1/29: UDI:33/100, UIQ: 6/100, POPDI:6/100    Time  10    Period  Weeks    Status  On-going    Target Date  03/14/20      PT LONG TERM GOAL #4   Title  Pt. 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    Baseline  Pt.  requiring RW for AMB due to L foot-drop and imbalance. As of 10/12: Pt. requiring some support (e.g. grocery cart) for > 20 min. of AMB or stairs.    Time  10    Period  Weeks    Status  Achieved    Target Date  11/26/19            Plan - 01/07/20 0907    Clinical Impression Statement  Pt. has made excellent progress thus far, achieving all short-term goals and making significant progress toward or meeting long-term goals as well. She is no longer having any difficulty walking and has functional though still slightly less than equal strength in her LLE. She continues to have occasional SUI or UUI but the frequency has decreased to ~ 1-2 times per week and is drops rather than full bladder emptying. She was able to have intercourse but continues to have some pain with this activity (~3-4/10 NPS), though she says she believes it would have been much worse had she attempted before the interventions we have taken thus far. She continues to demonstrate improved strength and spinal alignment and mobility but will require further strengthening to allow for further Sx. reduction and prevention of return of Sx.    Personal Factors and Comorbidities  Comorbidity 1;Comorbidity 2    Comorbidities  scoliosis, hypermobility    Examination-Activity Limitations  Locomotion Level;Stand;Stairs;Lift;Squat;Toileting;Continence;Sit;Transfers;Caring for Others;Bend    Examination-Participation Restrictions  Interpersonal Relationship;Yard Work;Community Activity;Meal Prep;Laundry    Stability/Clinical Decision Making  Evolving/Moderate complexity    Clinical Decision Making  Moderate    Rehab Potential  Good    PT Frequency  1x / week  PT Duration  Other (comment)   10 weeks   PT Treatment/Interventions  ADLs/Self Care Home Management;Aquatic Therapy;Biofeedback;Electrical Stimulation;Gait training;Stair training;Balance training;Therapeutic exercise;Therapeutic activities;Functional mobility  training;Neuromuscular re-education;Patient/family education;Manual techniques;Scar mobilization;Dry needling;Taping;Spinal Manipulations;Joint Manipulations    PT Next Visit Plan  DN to QL and multifidus between T12-L4, follow with deep-core coordination (knee step through) stabilize gait pattern; treat spinal mobility to improve diaphragmcoordination and reduce L LE radiating symptoms; TP release vs. DN to, hip-flexors, and R pectineus. re-assess for Piriformis spasm (have performed DN and TP release now); check in with long term HEP performance    PT Home Exercise Plan  added end of bed hip flexor to replace tennis ball to hip flexors. A and B days: A- ankle exercises (swap theraband dorsiflexion with heel walking, 3 sets of 12 steps (or until fatigue or compensations occurs) , calf stretch, hip flexor stretch, 3 way wall stretch, side stretch, calf raises, Dorthy's B- mini-marches (seated pelvic tilts while at work), front planks, bow and arrow, scapular retractions, mid shoulder strengthening (with theraband) Every day- Kegels, side planks PRN- K-tape at SIJ; tennis ball release, seated thoracic twists to the R, upper cervical rotation to the L    Consulted and Agree with Plan of Care  Patient       Patient will benefit from skilled therapeutic intervention in order to improve the following deficits and impairments:  Abnormal gait, Decreased balance, Decreased endurance, Difficulty walking, Increased muscle spasms, Impaired tone, Improper body mechanics, Decreased activity tolerance, Decreased coordination, Decreased strength, Hypermobility, Postural dysfunction, Pain  Visit Diagnosis: Other muscle spasm  Foot drop, left  Sacrococcygeal disorders, not elsewhere classified     Problem List Patient Active Problem List   Diagnosis Date Noted  . Urinary incontinence 08/17/2019  . Foot drop, left 08/17/2019  . Maternal varicella, non-immune 11/20/2018   Willa Rough DPT, ATC Willa Rough 01/07/2020, 9:22 AM  [place] MAIN [facility] SERVICES [address] [place], [place], [address] Phone: [phone]   Fax:  [phone]  Name: Lisa Brady MRN: [id number] Date of Birth: 05-Jul-1986

## 2020-01-07 NOTE — Addendum Note (Signed)
Addended by: Letitia Libra T on: 01/07/2020 09:24 AM   Modules accepted: Orders

## 2020-01-11 ENCOUNTER — Other Ambulatory Visit: Payer: Self-pay

## 2020-01-11 ENCOUNTER — Ambulatory Visit: Payer: No Typology Code available for payment source | Attending: Obstetrics and Gynecology

## 2020-01-11 DIAGNOSIS — M62838 Other muscle spasm: Secondary | ICD-10-CM | POA: Diagnosis present

## 2020-01-11 DIAGNOSIS — M21372 Foot drop, left foot: Secondary | ICD-10-CM

## 2020-01-11 DIAGNOSIS — M533 Sacrococcygeal disorders, not elsewhere classified: Secondary | ICD-10-CM

## 2020-01-11 NOTE — Therapy (Addendum)
[place] MAIN [facility] SERVICES [address] [place], [place], [address] Phone: [phone]   Fax:  [phone]  Physical Therapy Treatment  The patient has been informed of current processes in place at Outpatient Rehab to protect patients from Covid-19 exposure including social distancing, schedule modifications, and new cleaning procedures. After discussing their particular risk with a therapist based on the patient's personal risk factors, the patient has decided to proceed with in-person therapy.   Patient Details  Name: Lisa Brady MRN: [id number] Date of Birth: 1986-04-14 Referring Provider (PT): [name]   Encounter Date: 01/11/2020  PT End of Session - 01/16/20 0913    Visit Number  15    Number of Visits  33    Date for PT Re-Evaluation  11/26/19    Authorization Type  [employer]    Authorization - Visit Number  2    Authorization - Number of Visits  10    PT Start Time  0800    PT Stop Time  0905    PT Time Calculation (min)  65 min    Activity Tolerance  Patient tolerated treatment well;No increased pain    Behavior During Therapy  WFL for tasks assessed/performed       Past Medical History:  Diagnosis Date  . Foot drop     Past Surgical History:  Procedure Laterality Date  . WISDOM TOOTH EXTRACTION  2006    There were no vitals filed for this visit.  Pelvic Floor Physical Therapy Treatment Note  SCREENING  Changes in medications, allergies, or medical history?: none    SUBJECTIVE  Patient reports: Had slight urgency on Friday/saturday last week and mild SUI with a coughing fit. Had back pain. When she went on a walk she had some lower abdominal pain that felt "muscle related" has probably not been doing "great" with PT because she has had extra work, volunteered for the respiratory clinic.   Precautions:  6 months PP  Pain update:  Location of pain:  Hips/LB/abdomen (shoulders/neck) Current  pain:  2/10 (1) Max pain:  4/10 NPS (3) Least pain:  0/10 Nature of pain: achy/uncomfortable  *feels "looser" through the L hip following treatment.  Patient Goals: Control her bladder/not need depends.   OBJECTIVE  Changes in:   MMT LE  - L DF 4+/5 (different rater may account for difference but could be due to spasms in back since test performed prior to DN/manual) - R DF 5/5 (from prior session)  Observation:  Today: L in-flare  C2 deviated L with mild L cervical curve throughout and decreased L cervical ROM. (from prior session)  Strength:  From prior session- [Pre-treatment: L single leg calf raise endurance: 14 (started to fatigue at 6)  Post-treatment: L single leg calf raise endurance: 17 (started to fatigue at 10)  -Pt. Reports before she got sick she could do 2 sets of 15.]   Pelvic floor: TTP through all regions on L, R not assessed today due to time constraint. Pt. Able to generate sufficient contraction to maintain probe placement in supine and with up to 30 degrees of HOB elevation. (from prior session)  ROM/Mobility:  Pt. Demonstrates decreased hip EXT ROM on L>R  Palpation:   Today: TTP to L Psoas, QL, Glute Med  TTP to L sub-occipitals and cervical extensors. (from prior session)  Gait Analysis: (from prior session) Gait speed: 1.72m/s ; anterior pelvic tilt, knee hyperextension, heel strike - with cues pelvic neutral  achieved, with appropriate arm swing and decreased heel strike. (from prior session)  Patient Outcome Measure:   INTERVENTIONS THIS SESSION: Manual Therapy:  Performed TP release to L Psoas, QL, and glute med followed by L QL hold-relax to decrease L in-flare and improve pelvic alignment/decrease pain.   Therex: Reviewed side-stretch and hip-flexor stretches to help maintain improved spasm/alignment.  Total time: 60 min.                             PT Short Term Goals - 01/07/20 [id number]      PT SHORT  TERM GOAL #1   Title  Patient will demonstrate improved pelvic alignment and balance of musculature surrounding the pelvis to facilitate decreased PFM spasms and decrease pressure on nerve roots to allow for optimal PFM function    Baseline  Has L foot-drop and pain in L glutes/knee as well as inability to sense PFM and L posterior innominate rotation/spasms surrounding pelvis.    Time  5    Period  Weeks    Status  Achieved    Target Date  11/26/19      PT SHORT TERM GOAL #2   Title  Patient will demonstrate HEP x1 in the clinic to demonstrate understanding and proper form to allow for further improvement.    Baseline  Pt. lacks knowledge of therapeutic ecersise that will decrease Sx.    Time  5    Period  Weeks    Status  Achieved    Target Date  11/26/19      PT SHORT TERM GOAL #3   Title  Pt. will be able to walk 250 Ft.with CGA and w/o assistive device to demonstrate improved balance/decreased instability and foot drop.    Baseline  Pt. using RW to AMB due to L foot-drop and increased instability.    Time  5    Period  Weeks    Status  Achieved    Target Date  08/20/19      PT SHORT TERM GOAL #4   Title  Patient will demonstrate a coordinated contraction, relaxation, and bulge of the pelvic floor muscles to demonstrate functional recruitment and motion and allow for further strengthening.    Baseline  Pt. reports inability to sense the PFM for kegel or relaxation, having mixed UI    Time  5    Period  Weeks    Status  Achieved    Target Date  08/20/19        PT Long Term Goals - 01/07/20 0853      PT LONG TERM GOAL #1   Title  Patient will report no episodes of UUI/SUI over the course of the prior two weeks to demonstrate improved functional ability.    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    Time  10    Period  Weeks    Status  On-going    Target Date  03/14/20       PT LONG TERM GOAL #2   Title  Patient will report no pain with intercourse to demonstrate improved functional ability.    Baseline  having mild pain with initial penetration As of 1:29: had some pain at initial penetration and with deeper thrusting at ~ 3-4/10 but "not terrible"    Time  10    Period  Weeks    Status  On-going    Target Date  03/14/20      PT LONG TERM GOAL #3   Title  Patient will score at or below 22/100 on the UDI and 27/100 on the UIQ to demonstrate a clinically meaningful decrease in disability and distress due to pelvic floor dysfunction.    Baseline  UDI: 67, UIQ: 72/100 10-19-19: UDI 55.5, UIQ 76/100; POPDI 11/100 As of 1/29: UDI:33/100, UIQ: 6/100, POPDI:6/100    Time  10    Period  Weeks    Status  On-going    Target Date  03/14/20      PT LONG TERM GOAL #4   Title  Pt. 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    Baseline  Pt. requiring RW for AMB due to L foot-drop and imbalance. As of 10/12: Pt. requiring some support (e.g. grocery cart) for > 20 min. of AMB or stairs.    Time  10    Period  Weeks    Status  Achieved    Target Date  11/26/19            Plan - 01/16/20 0914    Clinical Impression Statement  Pt. Responded well to all interventions today, demonstrating improved L hip EXT ROM and decreased spasm/pain as well as understanding and correct performance of all education and exercises provided today. They will continue to benefit from skilled physical therapy to work toward remaining goals and maximize function as well as decrease likelihood of symptom increase or recurrence.     PT Next Visit Plan  DN to QL and multifidus between T12-L4, follow with deep-core coordination (knee step through) stabilize gait pattern; treat spinal mobility to improve diaphragmcoordination and reduce L LE radiating symptoms; TP release vs. DN to,  hip-flexors, and R pectineus. re-assess for Piriformis spasm (have performed DN and TP release now); check in with long term HEP performance    PT Home Exercise Plan  added end of bed hip flexor to replace tennis ball to hip flexors. A and B days: A- ankle exercises (swap theraband dorsiflexion with heel walking, 3 sets of 12 steps (or until fatigue or compensations occurs) , calf stretch, hip flexor stretch, 3 way wall stretch, side stretch, calf raises, Dorthy's B- mini-marches (seated pelvic tilts while at work), front planks, bow and arrow, scapular retractions, mid shoulder strengthening (with theraband) Every day- Kegels, side planks PRN- K-tape at SIJ; tennis ball release, seated thoracic twists to the R, upper cervical rotation to the L       Patient will benefit from skilled therapeutic intervention in order to improve the following deficits and impairments:     Visit Diagnosis: Other muscle spasm  Foot drop, left  Sacrococcygeal disorders, not elsewhere classified     Problem List Patient Active Problem List   Diagnosis Date Noted  . Urinary incontinence 08/17/2019  . Foot drop, left 08/17/2019  . Maternal varicella, non-immune 11/20/2018   Willa Rough DPT, ATC Willa Rough 01/16/2020, 9:20 AM  [place] MAIN [facility] SERVICES [address] [place], [place], [address] Phone: [phone]   Fax:  [phone]  Name: Lisa Brady MRN: [id number] Date of Birth: [birth year]

## 2020-01-18 ENCOUNTER — Other Ambulatory Visit: Payer: Self-pay

## 2020-01-18 ENCOUNTER — Ambulatory Visit: Payer: No Typology Code available for payment source

## 2020-01-18 DIAGNOSIS — M62838 Other muscle spasm: Secondary | ICD-10-CM | POA: Diagnosis not present

## 2020-01-18 DIAGNOSIS — M21372 Foot drop, left foot: Secondary | ICD-10-CM

## 2020-01-18 DIAGNOSIS — M533 Sacrococcygeal disorders, not elsewhere classified: Secondary | ICD-10-CM

## 2020-01-18 NOTE — Therapy (Addendum)
[place] MAIN [facility] SERVICES [address], [place], [address] Phone: [phone]   Fax:  [phone]  Physical Therapy Treatment  The patient has been informed of current processes in place at Outpatient Rehab to protect patients from Covid-19 exposure including social distancing, schedule modifications, and new cleaning procedures. After discussing their particular risk with a therapist based on the patient's personal risk factors, the patient has decided to proceed with in-person therapy.   Patient Details  Name: Lisa Brady MRN: [id number] Date of Birth: 1986-06-23 Referring Provider (PT): [name]   Encounter Date: 01/18/2020  PT End of Session - 01/22/20 1439    Visit Number  25    Number of Visits  33    Date for PT Re-Evaluation  11/26/19    Authorization Type  [place]    Authorization - Visit Number  2    Authorization - Number of Visits  10    PT Start Time  0800    PT Stop Time  0900    PT Time Calculation (min)  60 min    Activity Tolerance  Patient tolerated treatment well;No increased pain    Behavior During Therapy  WFL for tasks assessed/performed       Past Medical History:  Diagnosis Date  . Foot drop     Past Surgical History:  Procedure Laterality Date  . WISDOM TOOTH EXTRACTION  2006    There were no vitals filed for this visit.   Pelvic Floor Physical Therapy Treatment Note  SCREENING  Changes in medications, allergies, or medical history?: none    SUBJECTIVE  Patient reports: Was finally able to get signed up for her vaccine. No accidents in the last week. Minor intermittent urgency, able to suppress with technique. Was able to have intercourse with only very faint, deep penetration pain but still has some scar tenderness. Her R QL has been tight/tender. Used tennis ball at R paraspinals at low back and near PSIS. It did seem to help, could feel radiating pain into the leg. Had  pain in the R upper neck but was able to do rotations to get it to ease off.   Precautions:  6 months PP  Pain update:  Location of pain:  Hips/LB/abdomen (shoulders/neck) Current pain:  1/10 (2) Max pain:  4/10 NPS (4) Least pain:  0/10 Nature of pain: achy/uncomfortable  *post-needling achyness following treatment.  Patient Goals: Control her bladder/not need depends.   OBJECTIVE  Changes in:   MMT LE  - L DF 4+/5 (different rater may account for difference but could be due to spasms in back since test performed prior to DN/manual) - R DF 5/5 (from prior session)  Observation:  Today: L in-flare  C2 deviated L with mild L cervical curve throughout and decreased L cervical ROM. (from prior session)  Strength:  From prior session- [Pre-treatment: L single leg calf raise endurance: 14 (started to fatigue at 6)  Post-treatment: L single leg calf raise endurance: 17 (started to fatigue at 10)  -Pt. Reports before she got sick she could do 2 sets of 15.]   Pelvic floor: TTP through all regions on L, R not assessed today due to time constraint. Pt. Able to generate sufficient contraction to maintain probe placement in supine and with up to 30 degrees of HOB elevation. (from prior session)  ROM/Mobility:   Palpation:   Today: TTP to B Paraspinals and multifidus near T-L junction   TTP  to L sub-occipitals and cervical extensors. (from prior session)  Gait Analysis: (from prior session) Gait speed: 1.73m/s ; anterior pelvic tilt, knee hyperextension, heel strike - with cues pelvic neutral achieved, with appropriate arm swing and decreased heel strike. (from prior session)  Patient Outcome Measure:   INTERVENTIONS THIS SESSION: Manual Therapy:  Performed TP release to B Paraspinals and multifidus near T-L junction to decrease spasm and pain and allow for improved balance of musculature for improved function and decreased symptoms.  Total time: 60  min.                   Trigger Point Dry Needling - 01/22/20 0001    Consent Given?  Yes    Education Handout Provided  No    Muscles Treated Back/Hip  Erector spinae;Lumbar multifidi;Thoracic multifidi    Erector spinae Response  Twitch response elicited;Palpable increased muscle length    Lumbar multifidi Response  Twitch response elicited;Palpable increased muscle length    Thoracic multifidi response  Twitch response elicited;Palpable increased muscle length             PT Short Term Goals - 01/07/20 0853      PT SHORT TERM GOAL #1   Title  Patient will demonstrate improved pelvic alignment and balance of musculature surrounding the pelvis to facilitate decreased PFM spasms and decrease pressure on nerve roots to allow for optimal PFM function    Baseline  Has L foot-drop and pain in L glutes/knee as well as inability to sense PFM and L posterior innominate rotation/spasms surrounding pelvis.    Time  5    Period  Weeks    Status  Achieved    Target Date  11/26/19      PT SHORT TERM GOAL #2   Title  Patient will demonstrate HEP x1 in the clinic to demonstrate understanding and proper form to allow for further improvement.    Baseline  Pt. lacks knowledge of therapeutic ecersise that will decrease Sx.    Time  5    Period  Weeks    Status  Achieved    Target Date  11/26/19      PT SHORT TERM GOAL #3   Title  Pt. will be able to walk 250 Ft.with CGA and w/o assistive device to demonstrate improved balance/decreased instability and foot drop.    Baseline  Pt. using RW to AMB due to L foot-drop and increased instability.    Time  5    Period  Weeks    Status  Achieved    Target Date  08/20/19      PT SHORT TERM GOAL #4   Title  Patient will demonstrate a coordinated contraction, relaxation, and bulge of the pelvic floor muscles to demonstrate functional recruitment and motion and allow for further strengthening.    Baseline  Pt. reports inability to  sense the PFM for kegel or relaxation, having mixed UI    Time  5    Period  Weeks    Status  Achieved    Target Date  08/20/19        PT Long Term Goals - 01/07/20 0853      PT LONG TERM GOAL #1   Title  Patient will report no episodes of UUI/SUI over the course of the prior two weeks to demonstrate improved functional ability.    Baseline  Having UUI at toiletside, h/o mild SUI before and during pregnancy As of 1/29: had 2 little episodes  one day over the past week.    Time  10    Period  Weeks    Status  On-going    Target Date  03/14/20      PT LONG TERM GOAL #2   Title  Patient will report no pain with intercourse to demonstrate improved functional ability.    Baseline  having mild pain with initial penetration As of 1:29: had some pain at initial penetration and with deeper thrusting at ~ 3-4/10 but "not terrible"    Time  10    Period  Weeks    Status  On-going    Target Date  03/14/20      PT LONG TERM GOAL #3   Title  Patient will score at or below 22/100 on the UDI and 27/100 on the UIQ to demonstrate a clinically meaningful decrease in disability and distress due to pelvic floor dysfunction.    Baseline  UDI: 67, UIQ: 72/100 10-19-19: UDI 55.5, UIQ 76/100; POPDI 11/100 As of 1/29: UDI:33/100, UIQ: 6/100, POPDI:6/100    Time  10    Period  Weeks    Status  On-going    Target Date  03/14/20      PT LONG TERM GOAL #4   Title  Pt. 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    Baseline  Pt. requiring RW for AMB due to L foot-drop and imbalance. As of 10/12: Pt. requiring some support (e.g. grocery cart) for > 20 min. of AMB or stairs.    Time  10    Period  Weeks    Status  Achieved    Target Date  11/26/19            Plan - 01/18/20 1327    Clinical Impression Statement  Pt. Responded well to all interventions today, demonstrating decreased spasm and  TTP to the T-L junction as well as understanding and correct performance of all education and exercises provided today. They will continue to benefit from skilled physical therapy to work toward remaining goals and maximize function as well as decrease likelihood of symptom increase or recurrence.     PT Next Visit Plan  deep-core coordination (knee step through) stabilize gait pattern; Internal TP release to scar at posterior fourchette, treat spinal mobility to improve diaphragmcoordination and reduce L LE radiating symptoms; TP release vs. DN to, hip-flexors, and R pectineus. re-assess for Piriformis spasm (have performed DN and TP release now); check in with long term HEP performance    PT Home Exercise Plan  added end of bed hip flexor to replace tennis ball to hip flexors. A and B days: A- ankle exercises (swap theraband dorsiflexion with heel walking, 3 sets of 12 steps (or until fatigue or compensations occurs) , calf stretch, hip flexor stretch, 3 way wall stretch, side stretch, calf raises, Dorthy's B- mini-marches (seated pelvic tilts while at work), front planks, bow and arrow, scapular retractions, mid shoulder strengthening (with theraband) Every day- Kegels, side planks PRN- K-tape at SIJ; tennis ball release, seated thoracic twists to the R, upper cervical rotation to the L       Patient will benefit from skilled therapeutic intervention in order to improve the following deficits and impairments:     Visit Diagnosis: Other muscle spasm  Foot drop, left  Sacrococcygeal disorders, not elsewhere classified     Problem List Patient  Active Problem List   Diagnosis Date Noted  . Urinary incontinence 08/17/2019  . Foot drop, left 08/17/2019  . Maternal varicella, non-immune 11/20/2018   Willa Rough DPT, ATC Willa Rough 01/22/2020, 2:41 PM  [place] MAIN [facility] SERVICES [address] [place], [place], [address] Phone: [phone]    Fax:  [phone]  Name: Lisa Brady MRN: [id number] Date of Birth: 1986-04-24

## 2020-02-01 ENCOUNTER — Ambulatory Visit: Payer: No Typology Code available for payment source

## 2020-02-01 ENCOUNTER — Other Ambulatory Visit: Payer: Self-pay

## 2020-02-01 DIAGNOSIS — M62838 Other muscle spasm: Secondary | ICD-10-CM | POA: Diagnosis not present

## 2020-02-01 DIAGNOSIS — M21372 Foot drop, left foot: Secondary | ICD-10-CM

## 2020-02-01 DIAGNOSIS — M533 Sacrococcygeal disorders, not elsewhere classified: Secondary | ICD-10-CM

## 2020-02-01 NOTE — Patient Instructions (Signed)
A day:  Teapot Side-planks Planks Dorothy's   B day: Mini-Marches with leg extensions Child's pose  V-sit/ single leg hamstring stretch Side stretch  Every day: Kegels 3-5x/day!

## 2020-02-01 NOTE — Therapy (Addendum)
[place] MAIN [facility] SERVICES [address] [place], [place], [address] Phone: [phone]   Fax:  [phone]  Physical Therapy Treatment  The patient has been informed of current processes in place at Outpatient Rehab to protect patients from Covid-19 exposure including social distancing, schedule modifications, and new cleaning procedures. After discussing their particular risk with a therapist based on the patient's personal risk factors, the patient has decided to proceed with in-person therapy.   Patient Details  Name: Lisa Brady MRN: [id number] Date of Birth: 1986/11/26 Referring Provider (PT): [name]   Encounter Date: 02/01/2020  PT End of Session - 02/13/20 1404    Visit Number  26    Number of Visits  33    Date for PT Re-Evaluation  11/26/19    Authorization Type  [place]    Authorization - Visit Number  3    Authorization - Number of Visits  10    Progress Note Due on Visit  10    PT Start Time  0800    PT Stop Time  0900    PT Time Calculation (min)  60 min    Activity Tolerance  Patient tolerated treatment well;No increased pain    Behavior During Therapy  WFL for tasks assessed/performed       Past Medical History:  Diagnosis Date  . Foot drop     Past Surgical History:  Procedure Laterality Date  . WISDOM TOOTH EXTRACTION  2006    There were no vitals filed for this visit.   Pelvic Floor Physical Therapy Treatment Note  SCREENING  Changes in medications, allergies, or medical history?: none    SUBJECTIVE  Patient reports: Feels very discouraged, had one accident on the weekend over two weeks. May have strained more when she had a BM that day, was standing in the kitchen and had a lot of leakage, enough to fill the pad. The biggest frustration is that it does not seem to be a specific dribble and then it is a large amount, not just dribbles. Has had a harder time doing her exercises lately  due to work and the baby being "sad". Forgot to do Dorothy's for 2 weeks.  Her shoulder starts hurting when doing side-plank which limits endurance, was able to do 1:30 at one point and is now down to 30 sec but is doing planks on feet not knees.  Still more sensitive to touch in the L>R but not noticing pain much at rest  Precautions:  7 months PP  Pain update:  Location of pain:  Hips/LB/abdomen (shoulders/neck) Current pain:  0/10 (0) Max pain:  0/10 NPS (3) Least pain:  0/10 (0) Nature of pain: achy/uncomfortable  * no increased pain following treatment.  Patient Goals: Control her bladder/not need depends.   OBJECTIVE  Changes in:   MMT LE  - L DF 4+/5 (different rater may account for difference but could be due to spasms in back since test performed prior to DN/manual) - R DF 5/5 (from prior session)  Observation:   C2 deviated L with mild L cervical curve throughout and decreased L cervical ROM. (from prior session)  Strength:  From prior session- [Pre-treatment: L single leg calf raise endurance: 14 (started to fatigue at 6)  Post-treatment: L single leg calf raise endurance: 17 (started to fatigue at 10)  -Pt. Reports before she got sick she could do 2 sets of 15.]   Pelvic floor: TTP through 5  o'clock region of posterior fourchette. 3+/5 strength with TC/VC.  ROM/Mobility:   Palpation:   TTP to L sub-occipitals and cervical extensors. (from prior session)  Gait Analysis: (from prior session) Gait speed: 1.35m/s ; anterior pelvic tilt, knee hyperextension, heel strike - with cues pelvic neutral achieved, with appropriate arm swing and decreased heel strike. (from prior session)  Patient Outcome Measure:   INTERVENTIONS THIS SESSION: Manual Therapy:  Re-assessed PFM strength and performed TP release and scar release at ~ 5 o'clock to allow for decreased pressure on nerve bundle and increased recruitment and strength of PFM.  Therex: reviewed and  modified HEP to make it more manageable, emphasized importance of Kegels to decrease remaining PFM weakness and increase pelvic stability.  Total time: 60 min.                                 PT Short Term Goals - 01/07/20 [id number]      PT SHORT TERM GOAL #1   Title  Patient will demonstrate improved pelvic alignment and balance of musculature surrounding the pelvis to facilitate decreased PFM spasms and decrease pressure on nerve roots to allow for optimal PFM function    Baseline  Has L foot-drop and pain in L glutes/knee as well as inability to sense PFM and L posterior innominate rotation/spasms surrounding pelvis.    Time  5    Period  Weeks    Status  Achieved    Target Date  11/26/19      PT SHORT TERM GOAL #2   Title  Patient will demonstrate HEP x1 in the clinic to demonstrate understanding and proper form to allow for further improvement.    Baseline  Pt. lacks knowledge of therapeutic ecersise that will decrease Sx.    Time  5    Period  Weeks    Status  Achieved    Target Date  11/26/19      PT SHORT TERM GOAL #3   Title  Pt. will be able to walk 250 Ft.with CGA and w/o assistive device to demonstrate improved balance/decreased instability and foot drop.    Baseline  Pt. using RW to AMB due to L foot-drop and increased instability.    Time  5    Period  Weeks    Status  Achieved    Target Date  08/20/19      PT SHORT TERM GOAL #4   Title  Patient will demonstrate a coordinated contraction, relaxation, and bulge of the pelvic floor muscles to demonstrate functional recruitment and motion and allow for further strengthening.    Baseline  Pt. reports inability to sense the PFM for kegel or relaxation, having mixed UI    Time  5    Period  Weeks    Status  Achieved    Target Date  08/20/19        PT Long Term Goals - 01/07/20 0853      PT LONG TERM GOAL #1   Title  Patient will report no episodes of UUI/SUI over the course of the prior  two weeks to demonstrate improved functional ability.    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    Time  10    Period  Weeks    Status  On-going    Target Date  03/14/20      PT LONG TERM GOAL #2   Title  Patient will report no pain with intercourse to demonstrate improved functional ability.    Baseline  having mild pain with initial penetration As of 1:29: had some pain at initial penetration and with deeper thrusting at ~ 3-4/10 but "not terrible"    Time  10    Period  Weeks    Status  On-going    Target Date  03/14/20      PT LONG TERM GOAL #3   Title  Patient will score at or below 22/100 on the UDI and 27/100 on the UIQ to demonstrate a clinically meaningful decrease in disability and distress due to pelvic floor dysfunction.    Baseline  UDI: 67, UIQ: 72/100 10-19-19: UDI 55.5, UIQ 76/100; POPDI 11/100 As of 1/29: UDI:33/100, UIQ: 6/100, POPDI:6/100    Time  10    Period  Weeks    Status  On-going    Target Date  03/14/20      PT LONG TERM GOAL #4   Title  Pt. 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    Baseline  Pt. requiring RW for AMB due to L foot-drop and imbalance. As of 10/12: Pt. requiring some support (e.g. grocery cart) for > 20 min. of AMB or stairs.    Time  10    Period  Weeks    Status  Achieved    Target Date  11/26/19            Plan - 02/13/20 1405    Clinical Impression Statement  Pt. Responded well to all interventions today, demonstrating decreased scar tissue restriction/decrased spasm, improved PFM recruitment and strength, as well as understanding and correct performance of all education and exercises provided today. They will continue to benefit from skilled physical therapy to work toward remaining goals and maximize function as well as  decrease likelihood of symptom increase or recurrence.     PT Next Visit Plan  deep-core coordination (knee step through) stabilize gait pattern; Internal TP release to scar at posterior fourchette, treat spinal mobility to improve diaphragmcoordination and reduce L LE radiating symptoms; TP release vs. DN to, hip-flexors, and R pectineus. re-assess for Piriformis spasm (have performed DN and TP release now); check in with long term HEP performance    PT Home Exercise Plan  A day: TeapotSide-planksPlanksDorothy's B [id number] with leg extensionsChild's pose V-sit/ single leg hamstring stretchSide stretch Every day: Kegels 3-5x/day!    Consulted and Agree with Plan of Care  Patient       Patient will benefit from skilled therapeutic intervention in order to improve the following deficits and impairments:     Visit Diagnosis: Other muscle spasm  Foot drop, left  Sacrococcygeal disorders, not elsewhere classified     Problem List Patient Active Problem List   Diagnosis Date Noted  . Urinary incontinence 08/17/2019  . Foot drop, left 08/17/2019  . Maternal varicella, non-immune 11/20/2018   Willa Rough DPT, ATC Willa Rough 02/13/2020, 2:10 PM  [place] MAIN [facility] SERVICES [address] [place], [place], [address] Phone: [phone]   Fax:  [phone]  Name: Lisa Brady MRN: [id number] Date of Birth: [birth year]

## 2020-02-15 ENCOUNTER — Ambulatory Visit: Payer: No Typology Code available for payment source | Attending: Obstetrics and Gynecology

## 2020-02-15 DIAGNOSIS — M62838 Other muscle spasm: Secondary | ICD-10-CM | POA: Insufficient documentation

## 2020-02-15 DIAGNOSIS — M533 Sacrococcygeal disorders, not elsewhere classified: Secondary | ICD-10-CM | POA: Insufficient documentation

## 2020-02-15 DIAGNOSIS — M21372 Foot drop, left foot: Secondary | ICD-10-CM | POA: Diagnosis present

## 2020-02-15 NOTE — Therapy (Signed)
[place] MAIN [facility] SERVICES [address] [place], [place], [address] Phone: [phone]   Fax:  [phone]  Physical Therapy Treatment  The patient has been informed of current processes in place at Outpatient Rehab to protect patients from Covid-19 exposure including social distancing, schedule modifications, and new cleaning procedures. After discussing their particular risk with a therapist based on the patient's personal risk factors, the patient has decided to proceed with in-person therapy.   Patient Details  Name: Lisa Brady MRN: [id number] Date of Birth: 02/05/1986 Referring Provider (PT): [name]   Encounter Date: 02/15/2020  PT End of Session - 02/15/20 0937    Visit Number  27    Number of Visits  33    Date for PT Re-Evaluation  03/17/20    Authorization Type  [place]    Authorization - Visit Number  4    Authorization - Number of Visits  10    Progress Note Due on Visit  10    PT Start Time  0805    PT Stop Time  0905    PT Time Calculation (min)  60 min    Activity Tolerance  Patient tolerated treatment well;No increased pain    Behavior During Therapy  WFL for tasks assessed/performed       Past Medical History:  Diagnosis Date  . Foot drop     Past Surgical History:  Procedure Laterality Date  . WISDOM TOOTH EXTRACTION  2006    There were no vitals filed for this visit.   Pelvic Floor Physical Therapy Treatment Note  SCREENING  Changes in medications, allergies, or medical history?: none    SUBJECTIVE  Patient reports: After the release she felt like her Kegel's were a lot stronger and then she had to fly/had back pain from flying. Has a random bout of nausea and had a major accident. The next day she was just feeling more insecure. Went on a hike and ended up carrying the baby in the ergobaby, had more pain and had an accident the next day. Was on high alert for a few days, now that  she is home and doing exercises more she is doing a little better. Was frustrated that she could not do as much hiking for the rest of the trip. Was not using back support on the way there, did better on the way back.  Precautions:  7 months PP  Pain update:  Location of pain:  Mid back L>R (shoulders/neck) Current pain:  2-3/10 (1) Max pain:  5/10 NPS (2) Least pain:  0/10 (0) Nature of pain: achy/uncomfortable  *decreased LBP following treatment   Patient Goals: Control her bladder/not need depends.   OBJECTIVE  Changes in:   MMT LE  - L DF 4+/5 (different rater may account for difference but could be due to spasms in back since test performed prior to DN/manual) - R DF 5/5 (from prior session)  Observation:   C2 deviated L with mild L cervical curve throughout and decreased L cervical ROM. (from prior session)  Strength:  From prior session- [Pre-treatment: L single leg calf raise endurance: 14 (started to fatigue at 6)  Post-treatment: L single leg calf raise endurance: 17 (started to fatigue at 10)  -Pt. Reports before she got sick she could do 2 sets of 15.]   Pelvic floor: TTP through R posterior PR/PC and OI as well as 5 o'clock region of posterior fourchette. 3/5 strength following treatment.  ROM/Mobility:   Palpation:   TTP to L sub-occipitals and cervical extensors. (from prior session)  Gait Analysis: (from prior session) Gait speed: 1.48m/s ; anterior pelvic tilt, knee hyperextension, heel strike - with cues pelvic neutral achieved, with appropriate arm swing and decreased heel strike. (from prior session)  Patient Outcome Measure:   INTERVENTIONS THIS SESSION: Manual Therapy: Performed TP release to R posterior PR/PC and OI as well as 5 o'clock region of posterior fourchette to allow for decreased pressure on nerve bundle and increased recruitment and strength of PFM.  NM re-ed: Practiced Kegel's with and without tactile feedback and discussed  the use of kegel weights to improve proprioception and speed strengthening efforts to allow for future pregnancy.  Total time: 60 min.                            PT Short Term Goals - 01/07/20 [id number]      PT SHORT TERM GOAL #1   Title  Patient will demonstrate improved pelvic alignment and balance of musculature surrounding the pelvis to facilitate decreased PFM spasms and decrease pressure on nerve roots to allow for optimal PFM function    Baseline  Has L foot-drop and pain in L glutes/knee as well as inability to sense PFM and L posterior innominate rotation/spasms surrounding pelvis.    Time  5    Period  Weeks    Status  Achieved    Target Date  11/26/19      PT SHORT TERM GOAL #2   Title  Patient will demonstrate HEP x1 in the clinic to demonstrate understanding and proper form to allow for further improvement.    Baseline  Pt. lacks knowledge of therapeutic ecersise that will decrease Sx.    Time  5    Period  Weeks    Status  Achieved    Target Date  11/26/19      PT SHORT TERM GOAL #3   Title  Pt. will be able to walk 250 Ft.with CGA and w/o assistive device to demonstrate improved balance/decreased instability and foot drop.    Baseline  Pt. using RW to AMB due to L foot-drop and increased instability.    Time  5    Period  Weeks    Status  Achieved    Target Date  08/20/19      PT SHORT TERM GOAL #4   Title  Patient will demonstrate a coordinated contraction, relaxation, and bulge of the pelvic floor muscles to demonstrate functional recruitment and motion and allow for further strengthening.    Baseline  Pt. reports inability to sense the PFM for kegel or relaxation, having mixed UI    Time  5    Period  Weeks    Status  Achieved    Target Date  08/20/19        PT Long Term Goals - 01/07/20 0853      PT LONG TERM GOAL #1   Title  Patient will report no episodes of UUI/SUI over the course of the prior two weeks to demonstrate improved  functional ability.    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    Time  10    Period  Weeks    Status  On-going    Target Date  03/14/20  PT LONG TERM GOAL #2   Title  Patient will report no pain with intercourse to demonstrate improved functional ability.    Baseline  having mild pain with initial penetration As of 1:29: had some pain at initial penetration and with deeper thrusting at ~ 3-4/10 but "not terrible"    Time  10    Period  Weeks    Status  On-going    Target Date  03/14/20      PT LONG TERM GOAL #3   Title  Patient will score at or below 22/100 on the UDI and 27/100 on the UIQ to demonstrate a clinically meaningful decrease in disability and distress due to pelvic floor dysfunction.    Baseline  UDI: 67, UIQ: 72/100 10-19-19: UDI 55.5, UIQ 76/100; POPDI 11/100 As of 1/29: UDI:33/100, UIQ: 6/100, POPDI:6/100    Time  10    Period  Weeks    Status  On-going    Target Date  03/14/20      PT LONG TERM GOAL #4   Title  Pt. 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    Baseline  Pt. requiring RW for AMB due to L foot-drop and imbalance. As of 10/12: Pt. requiring some support (e.g. grocery cart) for > 20 min. of AMB or stairs.    Time  10    Period  Weeks    Status  Achieved    Target Date  11/26/19            Plan - 02/15/20 0939    Clinical Impression Statement  Pt. Responded well to all interventions today, demonstrating decreased pain and spasm of the PFM and increased recruitment as well as understanding and correct performance of all education and exercises provided today. They will continue to benefit from skilled physical therapy to work toward remaining goals and maximize function as well as decrease likelihood of symptom increase or recurrence.      PT Next Visit Plan  deep-core coordination (knee step through) stabilize gait pattern; Internal TP release to scar at posterior fourchette, treat spinal mobility to improve diaphragmcoordination and reduce L LE radiating symptoms; TP release vs. DN to, hip-flexors, and R pectineus. re-assess for Piriformis spasm (have performed DN and TP release now); check in with long term HEP performance    PT Home Exercise Plan  A day: TeapotSide-planksPlanksDorothy's B [id number] with leg extensionsChild's pose V-sit/ single leg hamstring stretchSide stretch Every day: Kegels 3-5x/day, vaginal weights    Consulted and Agree with Plan of Care  Patient       Patient will benefit from skilled therapeutic intervention in order to improve the following deficits and impairments:     Visit Diagnosis: Other muscle spasm  Foot drop, left  Sacrococcygeal disorders, not elsewhere classified     Problem List Patient Active Problem List   Diagnosis Date Noted  . Urinary incontinence 08/17/2019  . Foot drop, left 08/17/2019  . Maternal varicella, non-immune 11/20/2018   Willa Rough DPT, ATC Willa Rough 02/15/2020, 9:56 AM  [place] MAIN [facility] SERVICES [address] [place], [place], [address] Phone: [phone]   Fax:  [phone]  Name: Leisly Sheck MRN: [id number] Date of Birth: [birth year]

## 2020-02-22 ENCOUNTER — Ambulatory Visit: Payer: No Typology Code available for payment source

## 2020-02-22 ENCOUNTER — Other Ambulatory Visit: Payer: Self-pay

## 2020-02-22 DIAGNOSIS — M62838 Other muscle spasm: Secondary | ICD-10-CM

## 2020-02-22 DIAGNOSIS — M533 Sacrococcygeal disorders, not elsewhere classified: Secondary | ICD-10-CM

## 2020-02-22 DIAGNOSIS — M21372 Foot drop, left foot: Secondary | ICD-10-CM

## 2020-02-22 NOTE — Patient Instructions (Addendum)
  Do 2x10 in each direction.   Make sure your pelvis is neutral and draw up tall. Exhale and pull the shoulders back, with the arms going along for the ride. Do 2x10  Add this to "A" day

## 2020-02-22 NOTE — Therapy (Signed)
[place] MAIN [facility] SERVICES [address] [place], [place], [address] Phone: [phone]   Fax:  [phone]  Physical Therapy Treatment  The patient has been informed of current processes in place at Outpatient Rehab to protect patients from Covid-19 exposure including social distancing, schedule modifications, and new cleaning procedures. After discussing their particular risk with a therapist based on the patient's personal risk factors, the patient has decided to proceed with in-person therapy.   Patient Details  Name: Lisa Brady MRN: [id number] Date of Birth: [birth year] Referring Provider (PT): [name]   Encounter Date: 02/22/2020  PT End of Session - 02/22/20 1004    Visit Number  28    Number of Visits  33    Date for PT Re-Evaluation  03/17/20    Authorization Type  [place]    Authorization - Visit Number  5    Authorization - Number of Visits  10    Progress Note Due on Visit  10    PT Start Time  0800    PT Stop Time  0900    PT Time Calculation (min)  60 min    Activity Tolerance  Patient tolerated treatment well;No increased pain    Behavior During Therapy  WFL for tasks assessed/performed       Past Medical History:  Diagnosis Date  . Foot drop     Past Surgical History:  Procedure Laterality Date  . WISDOM TOOTH EXTRACTION  2006    There were no vitals filed for this visit.   Pelvic Floor Physical Therapy Treatment Note  SCREENING  Changes in medications, allergies, or medical history?: none    SUBJECTIVE  Patient reports: Had an accident while changing the babies diaper on Saturday. Feels like because she moves and does more over the weekend with the baby and going on walks, etc. And so she has tried doing the pelvic tilts a lot but it took until Tuesday for the feeling of insecurity to go away. Did some scar-tissue release this week. Worked on the scar for ~ 10 min. Came close to taking  ibuprofen one day this week but didn't end up taking it.  Precautions:  7.5 months PP  Pain update:  Location of pain:  Lateral hip/hip flexors (shoulders/neck) Current pain:  1-2/10 (0) Max pain:  2/10 NPS (2) Least pain:  0/10 (0) Nature of pain: tightness  *some achyness in T/L junction from treatment  Patient Goals: Control her bladder/not need depends.   OBJECTIVE  Changes in:   MMT LE  - L DF 4+/5 (different rater may account for difference but could be due to spasms in back since test performed prior to DN/manual) - R DF 5/5 (from prior session)  Observation:   C2 deviated L with mild L cervical curve throughout and decreased L cervical ROM. (from prior session)  Strength:  Decreased scapular stability.  Pelvic floor: TTP through R posterior PR/PC and OI as well as 5 o'clock region of posterior fourchette. 3/5 strength following treatment. (from prior session)  ROM/Mobility:   Palpation:   TTP to L>R multifidus and erector spinae near T-L junction  Gait Analysis: (from prior session) Gait speed: 1.45m/s ; anterior pelvic tilt, knee hyperextension, heel strike - with cues pelvic neutral achieved, with appropriate arm swing and decreased heel strike. (from prior session)  Patient Outcome Measure:   INTERVENTIONS THIS SESSION: Manual Therapy: Performed TP release and STM to B erector spinae at T-L junction  to decrease spasm and pain and allow for improved balance of musculature for improved function and decreased symptoms.  Dry-needle: Performed TPDN with a .30x86mm needle and standard approach with TENS as described below to decrease spasm and pain and allow for improved balance of musculature for improved function and decreased symptoms.  Therex: educated on and practiced 2x10 of seated rows with green band and I's, Y's, T's and W's to stabilize the scapula and improve posture and strength to prevent return of spasms and pain.   Total time: 60  min.                       Trigger Point Dry Needling - 02/22/20 0001    Consent Given?  Yes    Education Handout Provided  No    Muscles Treated Back/Hip  Erector spinae    Erector spinae Response  Twitch response elicited;Palpable increased muscle length    Thoracic multifidi response  Twitch response elicited;Palpable increased muscle length             PT Short Term Goals - 01/07/20 0853      PT SHORT TERM GOAL #1   Title  Patient will demonstrate improved pelvic alignment and balance of musculature surrounding the pelvis to facilitate decreased PFM spasms and decrease pressure on nerve roots to allow for optimal PFM function    Baseline  Has L foot-drop and pain in L glutes/knee as well as inability to sense PFM and L posterior innominate rotation/spasms surrounding pelvis.    Time  5    Period  Weeks    Status  Achieved    Target Date  11/26/19      PT SHORT TERM GOAL #2   Title  Patient will demonstrate HEP x1 in the clinic to demonstrate understanding and proper form to allow for further improvement.    Baseline  Pt. lacks knowledge of therapeutic ecersise that will decrease Sx.    Time  5    Period  Weeks    Status  Achieved    Target Date  11/26/19      PT SHORT TERM GOAL #3   Title  Pt. will be able to walk 250 Ft.with CGA and w/o assistive device to demonstrate improved balance/decreased instability and foot drop.    Baseline  Pt. using RW to AMB due to L foot-drop and increased instability.    Time  5    Period  Weeks    Status  Achieved    Target Date  08/20/19      PT SHORT TERM GOAL #4   Title  Patient will demonstrate a coordinated contraction, relaxation, and bulge of the pelvic floor muscles to demonstrate functional recruitment and motion and allow for further strengthening.    Baseline  Pt. reports inability to sense the PFM for kegel or relaxation, having mixed UI    Time  5    Period  Weeks    Status  Achieved    Target Date   08/20/19        PT Long Term Goals - 01/07/20 0853      PT LONG TERM GOAL #1   Title  Patient will report no episodes of UUI/SUI over the course of the prior two weeks to demonstrate improved functional ability.    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  Time  10    Period  Weeks    Status  On-going    Target Date  03/14/20      PT LONG TERM GOAL #2   Title  Patient will report no pain with intercourse to demonstrate improved functional ability.    Baseline  having mild pain with initial penetration As of 1:29: had some pain at initial penetration and with deeper thrusting at ~ 3-4/10 but "not terrible"    Time  10    Period  Weeks    Status  On-going    Target Date  03/14/20      PT LONG TERM GOAL #3   Title  Patient will score at or below 22/100 on the UDI and 27/100 on the UIQ to demonstrate a clinically meaningful decrease in disability and distress due to pelvic floor dysfunction.    Baseline  UDI: 67, UIQ: 72/100 10-19-19: UDI 55.5, UIQ 76/100; POPDI 11/100 As of 1/29: UDI:33/100, UIQ: 6/100, POPDI:6/100    Time  10    Period  Weeks    Status  On-going    Target Date  03/14/20      PT LONG TERM GOAL #4   Title  Pt. 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    Baseline  Pt. requiring RW for AMB due to L foot-drop and imbalance. As of 10/12: Pt. requiring some support (e.g. grocery cart) for > 20 min. of AMB or stairs.    Time  10    Period  Weeks    Status  Achieved    Target Date  11/26/19            Plan - 02/22/20 1005    Clinical Impression Statement  Pt. Responded well to all interventions today, demonstrating improved recruitment of scapular stabilizers, decreased pain/spasm near T/L junction, as well as understanding and correct performance of all  education and exercises provided today. They will continue to benefit from skilled physical therapy to work toward remaining goals and maximize function as well as decrease likelihood of symptom increase or recurrence.     PT Next Visit Plan  deep-core coordination (knee step through) stabilize gait pattern; Internal TP release to scar at posterior fourchette, treat spinal mobility to improve diaphragmcoordination and reduce L LE radiating symptoms; TP release vs. DN to, hip-flexors, and R pectineus. re-assess for Piriformis spasm (have performed DN and TP release now); check in with long term HEP performance    PT Home Exercise Plan  A day: TeapotSide-planksPlanksDorothy's, scapular rows and I's, Y's, T's, and W's  B [id number] with leg extensionsChild's pose V-sit/ single leg hamstring stretchSide stretch Every day: Kegels 3-5x/day, vaginal weights    Consulted and Agree with Plan of Care  Patient       Patient will benefit from skilled therapeutic intervention in order to improve the following deficits and impairments:     Visit Diagnosis: Other muscle spasm  Foot drop, left  Sacrococcygeal disorders, not elsewhere classified     Problem List Patient Active Problem List   Diagnosis Date Noted  . Urinary incontinence 08/17/2019  . Foot drop, left 08/17/2019  . Maternal varicella, non-immune 11/20/2018   Willa Rough DPT, ATC Willa Rough 02/22/2020, 10:07 AM  [place] MAIN [facility] SERVICES [address] [place], [place], [address] Phone: [phone]   Fax:  [phone]  Name: Roshawna Tewes MRN: [id number] Date of Birth: 1986/08/07

## 2020-02-29 ENCOUNTER — Other Ambulatory Visit: Payer: Self-pay

## 2020-02-29 ENCOUNTER — Ambulatory Visit: Payer: No Typology Code available for payment source

## 2020-02-29 DIAGNOSIS — M62838 Other muscle spasm: Secondary | ICD-10-CM

## 2020-02-29 DIAGNOSIS — M533 Sacrococcygeal disorders, not elsewhere classified: Secondary | ICD-10-CM

## 2020-02-29 DIAGNOSIS — M21372 Foot drop, left foot: Secondary | ICD-10-CM

## 2020-02-29 NOTE — Therapy (Signed)
[place] MAIN [facility] SERVICES [address] [place], [place], [address] Phone: [phone]   Fax:  [phone]  Physical Therapy Treatment  The patient has been informed of current processes in place at Outpatient Rehab to protect patients from Covid-19 exposure including social distancing, schedule modifications, and new cleaning procedures. After discussing their particular risk with a therapist based on the patient's personal risk factors, the patient has decided to proceed with in-person therapy.   Patient Details  Name: Lisa Brady MRN: [id number] Date of Birth: 1985-12-09 Referring Provider (PT): [name]   Encounter Date: 02/29/2020  PT End of Session - 02/29/20 1030    Visit Number  29    Number of Visits  33    Date for PT Re-Evaluation  03/17/20    Authorization Type  [place]    Authorization - Visit Number  6    Authorization - Number of Visits  10    Progress Note Due on Visit  10    PT Start Time  0800    PT Stop Time  0900    PT Time Calculation (min)  60 min    Activity Tolerance  Patient tolerated treatment well;No increased pain    Behavior During Therapy  WFL for tasks assessed/performed       Past Medical History:  Diagnosis Date  . Foot drop     Past Surgical History:  Procedure Laterality Date  . WISDOM TOOTH EXTRACTION  2006    There were no vitals filed for this visit.   Pelvic Floor Physical Therapy Treatment Note  SCREENING  Changes in medications, allergies, or medical history?: none    SUBJECTIVE  Patient reports: No accidents in a week. Maybe urgency 1-2 times. Has not been as active, been a little more cautious. Ordered intimate rose kegel weights. Some levator scap pain with "I's" that ease with following up with chin-tucks. Had some soreness in the lower abdomen. Unsure if it is pain or soreness.   Precautions:  7.5 months PP  Pain update:  Location of pain:  Lateral  hip/hip flexors (shoulders/neck) Current pain:  0/10 (.5) Max pain:  1/10 NPS (3) Least pain:  0/10 (0) Nature of pain: tightness  **no increased pain but some normal "soreness" from treatment.   Patient Goals: Control her bladder/not need depends.   OBJECTIVE  Changes in:   MMT LE  - L DF 4+/5 (different rater may account for difference but could be due to spasms in back since test performed prior to DN/manual) - R DF 5/5 (from prior session)  Observation:  FHP, protracted shoulders in seated and supine.   -following session, Pt. Able to attain normal ROM into retracted scapular position.  C2 deviated L with mild L cervical curve throughout and decreased L cervical ROM. (from prior session)  Strength:  Decreased scapular stability.  Pelvic floor: TTP through R posterior PR/PC and OI as well as 5 o'clock region of posterior fourchette. 3/5 strength following treatment. (from prior session)  ROM/Mobility:  Hidden C6 vertebrae, decreased mobility at C6-7 with TTP.   Palpation:  TTP to B pec minor and major, L levator scap, rhomboids, and upper trap.  Gait Analysis: (from prior session) Gait speed: 1.75m/s ; anterior pelvic tilt, knee hyperextension, heel strike - with cues pelvic neutral achieved, with appropriate arm swing and decreased heel strike. (from prior session)  Patient Outcome Measure:   INTERVENTIONS THIS SESSION: Manual Therapy: Performed TP release and STM  to B pec minor and major, L levator scap, rhomboids, and upper trap to decrease spasm and pain and allow for improved balance of musculature for improved function and decreased symptoms. Performed grade 3-4 PA mobs to the C-T junction followed by manual cervical traction to decrease pressure on nerve roots and allow for long-term relief/decreased spasm.   Therex: Reviewed "I's, Y's, T's, and W's as well as seated rows and chin-tucks briefly for maximal HEP efficacy and to re-test for pain with performance  following treatment.    Total time: 60 min.                              PT Short Term Goals - 01/07/20 [id number]      PT SHORT TERM GOAL #1   Title  Patient will demonstrate improved pelvic alignment and balance of musculature surrounding the pelvis to facilitate decreased PFM spasms and decrease pressure on nerve roots to allow for optimal PFM function    Baseline  Has L foot-drop and pain in L glutes/knee as well as inability to sense PFM and L posterior innominate rotation/spasms surrounding pelvis.    Time  5    Period  Weeks    Status  Achieved    Target Date  11/26/19      PT SHORT TERM GOAL #2   Title  Patient will demonstrate HEP x1 in the clinic to demonstrate understanding and proper form to allow for further improvement.    Baseline  Pt. lacks knowledge of therapeutic ecersise that will decrease Sx.    Time  5    Period  Weeks    Status  Achieved    Target Date  11/26/19      PT SHORT TERM GOAL #3   Title  Pt. will be able to walk 250 Ft.with CGA and w/o assistive device to demonstrate improved balance/decreased instability and foot drop.    Baseline  Pt. using RW to AMB due to L foot-drop and increased instability.    Time  5    Period  Weeks    Status  Achieved    Target Date  08/20/19      PT SHORT TERM GOAL #4   Title  Patient will demonstrate a coordinated contraction, relaxation, and bulge of the pelvic floor muscles to demonstrate functional recruitment and motion and allow for further strengthening.    Baseline  Pt. reports inability to sense the PFM for kegel or relaxation, having mixed UI    Time  5    Period  Weeks    Status  Achieved    Target Date  08/20/19        PT Long Term Goals - 01/07/20 0853      PT LONG TERM GOAL #1   Title  Patient will report no episodes of UUI/SUI over the course of the prior two weeks to demonstrate improved functional ability.    Baseline  Having UUI at toiletside, h/o mild SUI before and  during pregnancy As of 1/29: had 2 little episodes one day over the past week.    Time  10    Period  Weeks    Status  On-going    Target Date  03/14/20      PT LONG TERM GOAL #2   Title  Patient will report no pain with intercourse to demonstrate improved functional ability.    Baseline  having mild pain with initial penetration  As of 1:29: had some pain at initial penetration and with deeper thrusting at ~ 3-4/10 but "not terrible"    Time  10    Period  Weeks    Status  On-going    Target Date  03/14/20      PT LONG TERM GOAL #3   Title  Patient will score at or below 22/100 on the UDI and 27/100 on the UIQ to demonstrate a clinically meaningful decrease in disability and distress due to pelvic floor dysfunction.    Baseline  UDI: 67, UIQ: 72/100 10-19-19: UDI 55.5, UIQ 76/100; POPDI 11/100 As of 1/29: UDI:33/100, UIQ: 6/100, POPDI:6/100    Time  10    Period  Weeks    Status  On-going    Target Date  03/14/20      PT LONG TERM GOAL #4   Title  Pt. 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    Baseline  Pt. requiring RW for AMB due to L foot-drop and imbalance. As of 10/12: Pt. requiring some support (e.g. grocery cart) for > 20 min. of AMB or stairs.    Time  10    Period  Weeks    Status  Achieved    Target Date  11/26/19            Plan - 02/29/20 1030    Clinical Impression Statement  Pt. Responded well to all interventions today, demonstrating improved scapular position, decreased spasm and ability to perform HEP without pain, as well as understanding and correct performance of all education and exercises provided today. They will continue to benefit from skilled physical therapy to work toward remaining goals and maximize function as well as decrease likelihood of symptom increase or recurrence.     PT Next Visit Plan  deep-core coordination (knee step through)  stabilize gait pattern; Internal TP release to scar at posterior fourchette, treat spinal mobility to improve diaphragmcoordination and reduce L LE radiating symptoms; TP release vs. DN to, hip-flexors, and R pectineus. re-assess for Piriformis spasm (have performed DN and TP release now); check in with long term HEP performance    PT Home Exercise Plan  A day: TeapotSide-planksPlanksDorothy's, scapular rows and I's, Y's, T's, and W's  B [id number] with leg extensionsChild's pose V-sit/ single leg hamstring stretchSide stretch Every day: Kegels 3-5x/day, vaginal weights, chest-stretch    Consulted and Agree with Plan of Care  Patient       Patient will benefit from skilled therapeutic intervention in order to improve the following deficits and impairments:     Visit Diagnosis: Other muscle spasm  Foot drop, left  Sacrococcygeal disorders, not elsewhere classified     Problem List Patient Active Problem List   Diagnosis Date Noted  . Urinary incontinence 08/17/2019  . Foot drop, left 08/17/2019  . Maternal varicella, non-immune 11/20/2018   Willa Rough DPT, ATC Willa Rough 02/29/2020, 10:32 AM  [place] MAIN [facility] SERVICES [address] [place], [place], [address] Phone: [phone]   Fax:  [phone]  Name: Lisa Brady MRN: [id number] Date of Birth: 10-Oct-1986

## 2020-02-29 NOTE — Patient Instructions (Signed)
CHEST: Doorway, Bilateral - Standing   Both arms if narrow door                       Single arm for wide door  Standing in doorway, place hands and elbows on wall with elbows bent at shoulder height, shoulders down away from ears. Lean forward. Hold for_5_ breaths, repeat 2-3 times, 1-2 times per day.

## 2020-03-07 ENCOUNTER — Other Ambulatory Visit: Payer: Self-pay

## 2020-03-07 ENCOUNTER — Ambulatory Visit: Payer: No Typology Code available for payment source | Attending: Obstetrics and Gynecology

## 2020-03-07 DIAGNOSIS — M21372 Foot drop, left foot: Secondary | ICD-10-CM | POA: Diagnosis present

## 2020-03-07 DIAGNOSIS — M62838 Other muscle spasm: Secondary | ICD-10-CM | POA: Diagnosis not present

## 2020-03-07 DIAGNOSIS — M533 Sacrococcygeal disorders, not elsewhere classified: Secondary | ICD-10-CM | POA: Diagnosis present

## 2020-03-07 NOTE — Therapy (Signed)
[place] MAIN [facility] SERVICES [address] [place], [place], [address] Phone: [phone]   Fax:  [phone]  Physical Therapy Treatment  The patient has been informed of current processes in place at Outpatient Rehab to protect patients from Covid-19 exposure including social distancing, schedule modifications, and new cleaning procedures. After discussing their particular risk with a therapist based on the patient's personal risk factors, the patient has decided to proceed with in-person therapy.   Patient Details  Name: Lisa Brady MRN: [id number] Date of Birth: 12-05-1986 Referring Provider (PT): [name]   Encounter Date: 03/07/2020  PT End of Session - 03/07/20 0900    Visit Number  30    Number of Visits  33    Date for PT Re-Evaluation  03/17/20    Authorization Type  [place]    Authorization - Visit Number  7    Authorization - Number of Visits  10    Progress Note Due on Visit  10    PT Start Time  0800    PT Stop Time  0900    PT Time Calculation (min)  60 min    Activity Tolerance  Patient tolerated treatment well;No increased pain    Behavior During Therapy  WFL for tasks assessed/performed       Past Medical History:  Diagnosis Date  . Foot drop     Past Surgical History:  Procedure Laterality Date  . WISDOM TOOTH EXTRACTION  2006    There were no vitals filed for this visit.   Pelvic Floor Physical Therapy Treatment Note  SCREENING  Changes in medications, allergies, or medical history?: none    SUBJECTIVE  Patient reports: Sneeze/peed 2 times this week but no "full on accidents" not much urgency. Had a couple of times where things felt "heavy" but did some tilts and it seemed to help. Had a rough beginning of the week at work, did minimal PT stuff Monday and Tuesday. Her back was bothering her on the right side all week until today. Ordered Kegel weights, has not used them yet. Is getting  closer to 90 degrees with the sensor. Could do at least 10 before it started to try to fall out. Tried some in kneeling with less success.   Precautions:  7.5 months PP  Pain update:  Location of pain:  R side of back (shoulders/neck) Current pain:  1/10 (0) Max pain:  3/10 NPS (2) Least pain:  0/10 (0) Nature of pain: tightness   Patient Goals: Control her bladder/not need depends.   OBJECTIVE  Changes in:   MMT LE  - L DF 4+/5 (different rater may account for difference but could be due to spasms in back since test performed prior to DN/manual) - R DF 5/5 (from prior session)  Observation:  Improved ability to achieve scapular retraction/downward rotation.  C2 deviated L with mild L cervical curve throughout and decreased L cervical ROM. (from prior session)  Strength:  Decreased scapular stability.  Pelvic floor: TTP through R posterior PR/PC and OI as well as 5 o'clock region of posterior fourchette. 3/5 strength following treatment. (from prior session)  ROM/Mobility:  Hidden C6 vertebrae, decreased mobility at C6-7 with TTP. (from prior visit)  Decreased mobility at R costovertebral joints and into L thoracic rotation.   Palpation:  TTP through L multifidus through mid-thoracic spine.  Gait Analysis: (from prior session) Gait speed: 1.55m/s ; anterior pelvic tilt, knee hyperextension, heel strike - with  cues pelvic neutral achieved, with appropriate arm swing and decreased heel strike. (from prior session)  Patient Outcome Measure:   INTERVENTIONS THIS SESSION: Manual Therapy: Performed TP release along B multifidus, rhomboids, and erector spinae through mid-thoracic spine followed by PA mobs at R costovertebral joints and from L to R into L rotation through mid-thoracic spine to decrease spasm and pain and allow for improved balance of musculature for improved function and decreased symptoms and to improve mobility of joint and surrounding connective tissue  and decrease pressure on nerve roots for improved conductivity and function of down-stream tissues.   Therex: Briefly reviewed "I's, Y's, T's, and W's to determine where restriction is and re-assess strength.  -finished by laying on MHP for 5 min. To decrease tenderness but interrupted by fire alarm.   Total time: 60 min.                           PT Short Term Goals - 01/07/20 [id number]      PT SHORT TERM GOAL #1   Title  Patient will demonstrate improved pelvic alignment and balance of musculature surrounding the pelvis to facilitate decreased PFM spasms and decrease pressure on nerve roots to allow for optimal PFM function    Baseline  Has L foot-drop and pain in L glutes/knee as well as inability to sense PFM and L posterior innominate rotation/spasms surrounding pelvis.    Time  5    Period  Weeks    Status  Achieved    Target Date  11/26/19      PT SHORT TERM GOAL #2   Title  Patient will demonstrate HEP x1 in the clinic to demonstrate understanding and proper form to allow for further improvement.    Baseline  Pt. lacks knowledge of therapeutic ecersise that will decrease Sx.    Time  5    Period  Weeks    Status  Achieved    Target Date  11/26/19      PT SHORT TERM GOAL #3   Title  Pt. will be able to walk 250 Ft.with CGA and w/o assistive device to demonstrate improved balance/decreased instability and foot drop.    Baseline  Pt. using RW to AMB due to L foot-drop and increased instability.    Time  5    Period  Weeks    Status  Achieved    Target Date  08/20/19      PT SHORT TERM GOAL #4   Title  Patient will demonstrate a coordinated contraction, relaxation, and bulge of the pelvic floor muscles to demonstrate functional recruitment and motion and allow for further strengthening.    Baseline  Pt. reports inability to sense the PFM for kegel or relaxation, having mixed UI    Time  5    Period  Weeks    Status  Achieved    Target Date  08/20/19         PT Long Term Goals - 01/07/20 0853      PT LONG TERM GOAL #1   Title  Patient will report no episodes of UUI/SUI over the course of the prior two weeks to demonstrate improved functional ability.    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    Time  10    Period  Weeks    Status  On-going  Target Date  03/14/20      PT LONG TERM GOAL #2   Title  Patient will report no pain with intercourse to demonstrate improved functional ability.    Baseline  having mild pain with initial penetration As of 1:29: had some pain at initial penetration and with deeper thrusting at ~ 3-4/10 but "not terrible"    Time  10    Period  Weeks    Status  On-going    Target Date  03/14/20      PT LONG TERM GOAL #3   Title  Patient will score at or below 22/100 on the UDI and 27/100 on the UIQ to demonstrate a clinically meaningful decrease in disability and distress due to pelvic floor dysfunction.    Baseline  UDI: 67, UIQ: 72/100 10-19-19: UDI 55.5, UIQ 76/100; POPDI 11/100 As of 1/29: UDI:33/100, UIQ: 6/100, POPDI:6/100    Time  10    Period  Weeks    Status  On-going    Target Date  03/14/20      PT LONG TERM GOAL #4   Title  Pt. 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    Baseline  Pt. requiring RW for AMB due to L foot-drop and imbalance. As of 10/12: Pt. requiring some support (e.g. grocery cart) for > 20 min. of AMB or stairs.    Time  10    Period  Weeks    Status  Achieved    Target Date  11/26/19            Plan - 03/07/20 0901    Clinical Impression Statement  Pt. Responded well to all interventions today, demonstrating improved thoracic mobility and decreased spasm as well as understanding and correct performance of all education and exercises provided today. They will  continue to benefit from skilled physical therapy to work toward remaining goals and maximize function as well as decrease likelihood of symptom increase or recurrence.     PT Next Visit Plan  deep-core coordination (knee step through) stabilize gait pattern; Internal TP release to scar at posterior fourchette, treat spinal mobility to improve diaphragmcoordination and reduce L LE radiating symptoms; TP release vs. DN to, hip-flexors, and R pectineus. re-assess for Piriformis spasm (have performed DN and TP release now); check in with long term HEP performance    PT Home Exercise Plan  A day: TeapotSide-planksPlanksDorothy's, scapular rows and I's, Y's, T's, and W's  B [id number] with leg extensionsChild's pose V-sit/ single leg hamstring stretchSide stretch Every day: Kegels 3-5x/day, vaginal weights, chest-stretch    Consulted and Agree with Plan of Care  Patient       Patient will benefit from skilled therapeutic intervention in order to improve the following deficits and impairments:     Visit Diagnosis: Other muscle spasm  Foot drop, left  Sacrococcygeal disorders, not elsewhere classified     Problem List Patient Active Problem List   Diagnosis Date Noted  . Urinary incontinence 08/17/2019  . Foot drop, left 08/17/2019  . Maternal varicella, non-immune 11/20/2018   Willa Rough DPT, ATC Willa Rough 03/07/2020, 11:30 AM  [place] MAIN [facility] SERVICES [address] [place], [place], [address] Phone: [phone]   Fax:  [phone]  Name: Lisa Brady MRN: [id number] Date of Birth: 10-17-1986

## 2020-03-14 ENCOUNTER — Other Ambulatory Visit: Payer: Self-pay

## 2020-03-14 ENCOUNTER — Ambulatory Visit: Payer: No Typology Code available for payment source

## 2020-03-14 DIAGNOSIS — M533 Sacrococcygeal disorders, not elsewhere classified: Secondary | ICD-10-CM

## 2020-03-14 DIAGNOSIS — M62838 Other muscle spasm: Secondary | ICD-10-CM

## 2020-03-14 DIAGNOSIS — M21372 Foot drop, left foot: Secondary | ICD-10-CM

## 2020-03-14 NOTE — Therapy (Addendum)
[place] MAIN [facility] SERVICES [address] [place], [place], [address] Phone: [phone]   Fax:  [phone]  Physical Therapy Treatment  The patient has been informed of current processes in place at Outpatient Rehab to protect patients from Covid-19 exposure including social distancing, schedule modifications, and new cleaning procedures. After discussing their particular risk with a therapist based on the patient's personal risk factors, the patient has decided to proceed with in-person therapy.   Patient Details  Name: Lisa Brady MRN: [id number] Date of Birth: [birth year] Referring Provider (PT): [name]   Encounter Date: 03/14/2020  PT End of Session - 03/19/20 1618    Visit Number  31    Number of Visits  33    Date for PT Re-Evaluation  03/17/20    Authorization Type  [place]    Authorization - Visit Number  8    Authorization - Number of Visits  10    Progress Note Due on Visit  10    PT Start Time  0805    PT Stop Time  [id number]    PT Time Calculation (min)  50 min    Activity Tolerance  Patient tolerated treatment well;No increased pain    Behavior During Therapy  WFL for tasks assessed/performed       Past Medical History:  Diagnosis Date  . Foot drop     Past Surgical History:  Procedure Laterality Date  . WISDOM TOOTH EXTRACTION  2006    There were no vitals filed for this visit.  Pelvic Floor Physical Therapy Treatment Note  SCREENING  Changes in medications, allergies, or medical history?: none    SUBJECTIVE  Patient reports: Sneeze/peed a couple times, minor urge a couple times, needed to do tilts a couple times, pain is mostly better. For ~ 3 days after last session she felt very tender in the spot that was irritated with treatment last session little better lately.   Precautions:  7.5 months PP  Pain update:  Location of pain:  R side of back (shoulders/neck) Current pain:  1/10  (0) Max pain:  3/10 NPS (2) Least pain:  0/10 (0) Nature of pain: tightness   Patient Goals: Control her bladder/not need depends.   OBJECTIVE  Changes in:   MMT LE  - L DF 4+/5 (different rater may account for difference but could be due to spasms in back since test performed prior to DN/manual) - R DF 5/5 (from prior session)  Observation:  Greatest TTP is near apex of thoracic curve at ~ T5-6  C2 deviated L with mild L cervical curve throughout and decreased L cervical ROM. (from prior session)  Strength:   Pelvic floor: TTP through R posterior PR/PC and OI as well as 5 o'clock region of posterior fourchette. 3/5 strength following treatment. (from prior session)  ROM/Mobility:  Hidden C6 vertebrae, decreased mobility at C6-7 with TTP. (from prior visit)  Decreased mobility at R costovertebral joints and into L thoracic rotation.   Palpation:  Highly TTP to R multifidus and erector spinae through T5-6, referring out B  Gait Analysis: (from prior session) Gait speed: 1.22m/s ; anterior pelvic tilt, knee hyperextension, heel strike - with cues pelvic neutral achieved, with appropriate arm swing and decreased heel strike. (from prior session)  Patient Outcome Measure:   INTERVENTIONS THIS SESSION: Manual Therapy: Performed TP release along R multifidus and erector spinae through T5-6  to decrease spasm and pain and allow for improved  balance of musculature for improved function and decreased symptoms.  Dry-needle: Performed TPDN with a .30x74mm needle and standard approach as described below to decrease spasm and pain and allow for improved balance of musculature for improved function and decreased symptoms.  -finished by laying on MHP for ~5 min. To decrease tenderness/post treatment ache  Total time: 50 min.                    Trigger Point Dry Needling - 03/19/20 0001    Consent Given?  Yes    Education Handout Provided  No    Muscles Treated  Back/Hip  Erector spinae;Thoracic multifidi    Dry Needling Comments  Right    Erector spinae Response  Twitch response elicited;Palpable increased muscle length    Thoracic multifidi response  Twitch response elicited;Palpable increased muscle length             PT Short Term Goals - 01/07/20 0853      PT SHORT TERM GOAL #1   Title  Patient will demonstrate improved pelvic alignment and balance of musculature surrounding the pelvis to facilitate decreased PFM spasms and decrease pressure on nerve roots to allow for optimal PFM function    Baseline  Has L foot-drop and pain in L glutes/knee as well as inability to sense PFM and L posterior innominate rotation/spasms surrounding pelvis.    Time  5    Period  Weeks    Status  Achieved    Target Date  11/26/19      PT SHORT TERM GOAL #2   Title  Patient will demonstrate HEP x1 in the clinic to demonstrate understanding and proper form to allow for further improvement.    Baseline  Pt. lacks knowledge of therapeutic ecersise that will decrease Sx.    Time  5    Period  Weeks    Status  Achieved    Target Date  11/26/19      PT SHORT TERM GOAL #3   Title  Pt. will be able to walk 250 Ft.with CGA and w/o assistive device to demonstrate improved balance/decreased instability and foot drop.    Baseline  Pt. using RW to AMB due to L foot-drop and increased instability.    Time  5    Period  Weeks    Status  Achieved    Target Date  08/20/19      PT SHORT TERM GOAL #4   Title  Patient will demonstrate a coordinated contraction, relaxation, and bulge of the pelvic floor muscles to demonstrate functional recruitment and motion and allow for further strengthening.    Baseline  Pt. reports inability to sense the PFM for kegel or relaxation, having mixed UI    Time  5    Period  Weeks    Status  Achieved    Target Date  08/20/19        PT Long Term Goals - 01/07/20 0853      PT LONG TERM GOAL #1   Title  Patient will report no  episodes of UUI/SUI over the course of the prior two weeks to demonstrate improved functional ability.    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    Time  10    Period  Weeks    Status  On-going    Target Date  03/14/20  PT LONG TERM GOAL #2   Title  Patient will report no pain with intercourse to demonstrate improved functional ability.    Baseline  having mild pain with initial penetration As of 1:29: had some pain at initial penetration and with deeper thrusting at ~ 3-4/10 but "not terrible"    Time  10    Period  Weeks    Status  On-going    Target Date  03/14/20      PT LONG TERM GOAL #3   Title  Patient will score at or below 22/100 on the UDI and 27/100 on the UIQ to demonstrate a clinically meaningful decrease in disability and distress due to pelvic floor dysfunction.    Baseline  UDI: 67, UIQ: 72/100 10-19-19: UDI 55.5, UIQ 76/100; POPDI 11/100 As of 1/29: UDI:33/100, UIQ: 6/100, POPDI:6/100    Time  10    Period  Weeks    Status  On-going    Target Date  03/14/20      PT LONG TERM GOAL #4   Title  Pt. 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    Baseline  Pt. requiring RW for AMB due to L foot-drop and imbalance. As of 10/12: Pt. requiring some support (e.g. grocery cart) for > 20 min. of AMB or stairs.    Time  10    Period  Weeks    Status  Achieved    Target Date  11/26/19            Plan - 03/19/20 1618    Clinical Impression Statement  Pt. Responded well to all interventions today, demonstrating decreased spasm and TTP following today's treatment. They will continue to benefit from skilled physical therapy to work toward remaining goals and maximize function as well as decrease likelihood of symptom increase or recurrence.     PT Next Visit Plan   deep-core coordination (knee step through) stabilize gait pattern; Internal TP release to scar at posterior fourchette, treat spinal mobility to improve diaphragmcoordination and reduce L LE radiating symptoms; TP release vs. DN to, hip-flexors, and R pectineus. re-assess for Piriformis spasm (have performed DN and TP release now); check in with long term HEP performance    PT Home Exercise Plan  A day: TeapotSide-planksPlanksDorothy's, scapular rows and I's, Y's, T's, and W's  B [id number] with leg extensionsChild's pose V-sit/ single leg hamstring stretchSide stretch Every day: Kegels 3-5x/day, vaginal weights, chest-stretch    Consulted and Agree with Plan of Care  Patient       Patient will benefit from skilled therapeutic intervention in order to improve the following deficits and impairments:     Visit Diagnosis: Other muscle spasm  Foot drop, left  Sacrococcygeal disorders, not elsewhere classified     Problem List Patient Active Problem List   Diagnosis Date Noted  . Urinary incontinence 08/17/2019  . Foot drop, left 08/17/2019  . Maternal varicella, non-immune 11/20/2018   Willa Rough DPT, ATC Willa Rough 03/19/2020, 4:27 PM  [place] MAIN [facility] SERVICES [address] [place], [place], [address] Phone: [phone]   Fax:  [phone]  Name: Lindyn Topalian MRN: [id number] Date of Birth: [birth year]

## 2020-03-21 ENCOUNTER — Other Ambulatory Visit: Payer: Self-pay

## 2020-03-21 ENCOUNTER — Ambulatory Visit: Payer: No Typology Code available for payment source

## 2020-03-21 DIAGNOSIS — M21372 Foot drop, left foot: Secondary | ICD-10-CM

## 2020-03-21 DIAGNOSIS — M62838 Other muscle spasm: Secondary | ICD-10-CM

## 2020-03-21 DIAGNOSIS — M533 Sacrococcygeal disorders, not elsewhere classified: Secondary | ICD-10-CM

## 2020-03-21 NOTE — Patient Instructions (Signed)
   Activate and draw up the pelvic floor and tuck pelvis slightly under by squeezing the lower tummy muscles and glutes. Hold up to 10 seconds, resting when the PF becomes tired. Repeat _10__ times, _1-2___ times per day.    Make sure the hips are both pointed forward and that the knee is not forward past the toes. Activate and draw up the pelvic floor and tuck pelvis slightly under by squeezing the lower tummy muscles and glutes. Shift the weight to the stable side and slowly bring the leg forward focusing on control. Do 2x10 on each side.    **For your mini-marches, start to straighten the leg to increase challenge!

## 2020-03-21 NOTE — Therapy (Signed)
[place] MAIN [facility] SERVICES [address] [place], [place], [address] Phone: [phone]   Fax:  [phone]  Physical Therapy Treatment  The patient has been informed of current processes in place at Outpatient Rehab to protect patients from Covid-19 exposure including social distancing, schedule modifications, and new cleaning procedures. After discussing their particular risk with a therapist based on the patient's personal risk factors, the patient has decided to proceed with in-person therapy.   Patient Details  Name: Lisa Brady MRN: [id number] Date of Birth: 1986-02-01 Referring Provider (PT): [name]   Encounter Date: 03/21/2020  PT End of Session - 03/21/20 0910    Visit Number  32    Number of Visits  33    Date for PT Re-Evaluation  03/17/20    Authorization Type  [place]    Authorization - Visit Number  9    Authorization - Number of Visits  10    Progress Note Due on Visit  10    PT Start Time  0805    PT Stop Time  0905    PT Time Calculation (min)  60 min    Activity Tolerance  Patient tolerated treatment well;No increased pain    Behavior During Therapy  WFL for tasks assessed/performed       Past Medical History:  Diagnosis Date  . Foot drop     Past Surgical History:  Procedure Laterality Date  . WISDOM TOOTH EXTRACTION  2006    There were no vitals filed for this visit.  Pelvic Floor Physical Therapy Treatment Note  SCREENING  Changes in medications, allergies, or medical history?: none    SUBJECTIVE  Patient reports: Had one cough/sneeze pee episode which necessitated changing her pad. Had one incident of heaviness and did her tilts to make it feel better. Back was generally low/achy Monday/Tuesday, may be why she was not as diligent with HEP. Her husband and son have both had some illness this week, has not gotten it herself but has been on high alert.  Has been 3-4 weeks since she had  a "oh shoot I'm peeing my pants" moment.    Precautions:  7.5 months PP  Pain update:  Location of pain:  L>R side of back (shoulders/neck) Current pain:  1/10 (1) Max pain:  3/10 NPS (1) Least pain:  0/10 (0) Nature of pain: tightness   Patient Goals: Control her bladder/not need depends.   OBJECTIVE  Changes in:   MMT LE  - L DF 4+/5 (different rater may account for difference but could be due to spasms in back since test performed prior to DN/manual) - R DF 5/5 (from prior session)  Observation:  Greatest TTP is near apex of thoracic curve at ~ T5-6  C2 deviated L with mild L cervical curve throughout and decreased L cervical ROM. (from prior session)  Strength:  Muscle fatigue reached earlier on R>L with tall-kneeling/balance exercises.  Pelvic floor: TTP through R posterior PR/PC and OI as well as 5 o'clock region of posterior fourchette. 3/5 strength following treatment. (from prior session)  ROM/Mobility:  Hidden C6 vertebrae, decreased mobility at C6-7 with TTP. (from prior visit)  Palpation:  Highly TTP to R multifidus and erector spinae through T5-6, referring out B  Gait Analysis: (from prior session) Gait speed: 1.72m/s ; anterior pelvic tilt, knee hyperextension, heel strike - with cues pelvic neutral achieved, with appropriate arm swing and decreased heel strike. (from prior session)  Patient  Outcome Measure:   INTERVENTIONS THIS SESSION: Self-care: discussed how pattern of sx. Is indicative of POP and options for continuing to strengthen Vs. Asking about bladder "tack" etc. In the context of pt. Wishing to have more children. Also  Discussed potential risk/benefit of c-section vs. Vaginal delivery and the likelihood of her having another nerve injury with delivery.   Therex: Educated on and practiced tall kneeling with kegel for endurance, half kneel step-through and forward stepping with balance to further increase functional deep-core stability and  reviewed and increased difficulty of mini-march to straight-leg raises to increase deep-core strength.  Total time: 60 min.                            PT Short Term Goals - 01/07/20 [id number]      PT SHORT TERM GOAL #1   Title  Patient will demonstrate improved pelvic alignment and balance of musculature surrounding the pelvis to facilitate decreased PFM spasms and decrease pressure on nerve roots to allow for optimal PFM function    Baseline  Has L foot-drop and pain in L glutes/knee as well as inability to sense PFM and L posterior innominate rotation/spasms surrounding pelvis.    Time  5    Period  Weeks    Status  Achieved    Target Date  11/26/19      PT SHORT TERM GOAL #2   Title  Patient will demonstrate HEP x1 in the clinic to demonstrate understanding and proper form to allow for further improvement.    Baseline  Pt. lacks knowledge of therapeutic ecersise that will decrease Sx.    Time  5    Period  Weeks    Status  Achieved    Target Date  11/26/19      PT SHORT TERM GOAL #3   Title  Pt. will be able to walk 250 Ft.with CGA and w/o assistive device to demonstrate improved balance/decreased instability and foot drop.    Baseline  Pt. using RW to AMB due to L foot-drop and increased instability.    Time  5    Period  Weeks    Status  Achieved    Target Date  08/20/19      PT SHORT TERM GOAL #4   Title  Patient will demonstrate a coordinated contraction, relaxation, and bulge of the pelvic floor muscles to demonstrate functional recruitment and motion and allow for further strengthening.    Baseline  Pt. reports inability to sense the PFM for kegel or relaxation, having mixed UI    Time  5    Period  Weeks    Status  Achieved    Target Date  08/20/19        PT Long Term Goals - 01/07/20 0853      PT LONG TERM GOAL #1   Title  Patient will report no episodes of UUI/SUI over the course of the prior two weeks to demonstrate improved functional  ability.    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    Time  10    Period  Weeks    Status  On-going    Target Date  03/14/20      PT LONG TERM GOAL #2   Title  Patient will report no pain with intercourse to demonstrate improved functional ability.  Baseline  having mild pain with initial penetration As of 1:29: had some pain at initial penetration and with deeper thrusting at ~ 3-4/10 but "not terrible"    Time  10    Period  Weeks    Status  On-going    Target Date  03/14/20      PT LONG TERM GOAL #3   Title  Patient will score at or below 22/100 on the UDI and 27/100 on the UIQ to demonstrate a clinically meaningful decrease in disability and distress due to pelvic floor dysfunction.    Baseline  UDI: 67, UIQ: 72/100 10-19-19: UDI 55.5, UIQ 76/100; POPDI 11/100 As of 1/29: UDI:33/100, UIQ: 6/100, POPDI:6/100    Time  10    Period  Weeks    Status  On-going    Target Date  03/14/20      PT LONG TERM GOAL #4   Title  Pt. 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    Baseline  Pt. requiring RW for AMB due to L foot-drop and imbalance. As of 10/12: Pt. requiring some support (e.g. grocery cart) for > 20 min. of AMB or stairs.    Time  10    Period  Weeks    Status  Achieved    Target Date  11/26/19            Plan - 03/21/20 0815    Clinical Impression Statement  Pt. Responded well to all interventions today, demonstrating improved timing and recruitment of deep-core as anticipatory muscle, increased deep core-strength between-sessions, as well as understanding and correct performance of all education and exercises provided today. They will continue to benefit from skilled physical therapy to work toward remaining goals and maximize function as well as decrease likelihood  of symptom increase or recurrence.     PT Next Visit Plan  RE-ASSESS and re-cert, review deep-core coordination (tall kneel, knee step through, stepping) stabilize gait pattern; Internal TP release to scar at posterior fourchette, treat spinal mobility to improve diaphragmcoordination and reduce L LE radiating symptoms; TP release vs. DN to, hip-flexors, and R pectineus. re-assess for Piriformis spasm (have performed DN and TP release now); check in with long term HEP performance    PT Home Exercise Plan  A day: TeapotSide-planksPlanksDorothy's, scapular rows and I's, Y's, T's, and W's  B [id number] with leg lift Child's pose V-sit/ single leg hamstring stretch Side stretch Every day: Kegels 3-5x/day, vaginal weights, chest-stretch, tall kneeling, tall-kneeling step-through    Consulted and Agree with Plan of Care  Patient       Patient will benefit from skilled therapeutic intervention in order to improve the following deficits and impairments:     Visit Diagnosis: Other muscle spasm  Foot drop, left  Sacrococcygeal disorders, not elsewhere classified     Problem List Patient Active Problem List   Diagnosis Date Noted  . Urinary incontinence 08/17/2019  . Foot drop, left 08/17/2019  . Maternal varicella, non-immune 11/20/2018   Willa Rough DPT, ATC Willa Rough 03/21/2020, 9:20 AM  [name] MAIN [facility] SERVICES [address] [place], [place], [address] Phone: [phone]   Fax:  [phone]  Name: Morena Gerardi MRN: [id number] Date of Birth: 01/20/1986

## 2020-03-28 ENCOUNTER — Ambulatory Visit: Payer: No Typology Code available for payment source

## 2020-03-28 ENCOUNTER — Other Ambulatory Visit: Payer: Self-pay

## 2020-03-28 DIAGNOSIS — M21372 Foot drop, left foot: Secondary | ICD-10-CM

## 2020-03-28 DIAGNOSIS — M533 Sacrococcygeal disorders, not elsewhere classified: Secondary | ICD-10-CM

## 2020-03-28 DIAGNOSIS — M62838 Other muscle spasm: Secondary | ICD-10-CM | POA: Diagnosis not present

## 2020-03-28 NOTE — Patient Instructions (Signed)
Do the R hip-flexor tennis ball release first, then below exercise  Pelvic Rotation: Contract / Relax (Supine)  MET to Correct Right Anteriorly Rotated/Left Posteriorly Rotated Innominate   Begin laying on your back with your feet at 90 degrees. Put a dowel/broomstick  through your legs, behind your right knee and in front of your left knee. Stabilize the dowel on ether side with your hands.  Press down with the right leg and up with the left leg. Hold for 5 seconds  then slowly relax. Repeat 5 times.

## 2020-03-28 NOTE — Therapy (Signed)
[place] MAIN [facility] SERVICES [address] [place], [place], [address] Phone: [phone]   Fax:  [phone]  Physical Therapy Treatment  The patient has been informed of current processes in place at Outpatient Rehab to protect patients from Covid-19 exposure including social distancing, schedule modifications, and new cleaning procedures. After discussing their particular risk with a therapist based on the patient's personal risk factors, the patient has decided to proceed with in-person therapy.  Patient Details  Name: Lisa Brady MRN: [id number] Date of Birth: 1986/09/28 Referring Provider (PT): [name]   Encounter Date: 03/28/2020    Past Medical History:  Diagnosis Date  . Foot drop     Past Surgical History:  Procedure Laterality Date  . WISDOM TOOTH EXTRACTION  2006    There were no vitals filed for this visit.   Pelvic Floor Physical Therapy Treatment Note  SCREENING  Changes in medications, allergies, or medical history?: none    SUBJECTIVE  Patient reports: Had N&V on Sunday and Tuesday, was really sick. Could not even drink water without feeling nausea on Tuesday. Had leakage whenever she would vomit. Her mom got sick after her and felt better before her. On Wednesday and Thursday she did a lot of PT and kegels/tilts Still feels prolapse sensation and leftover soreness in the R hip/back. Tried using Kegel weights a couple times.   Precautions:  7.5 months PP  Pain update:  Location of pain:  L>R side of back (shoulders/neck) Current pain:  1/10 (1) Max pain:  3/10 NPS (1) Least pain:  0/10 (0) Nature of pain: tightness   Patient Goals: Control her bladder/not need depends.   OBJECTIVE  Changes in:   MMT LE  - L DF 4+/5 (different rater may account for difference but could be due to spasms in back since test performed prior to DN/manual) - R DF 5/5 (from prior session)  Observation:   Greatest TTP is near apex of thoracic curve at ~ T5-6  C2 deviated L with mild L cervical curve throughout and decreased L cervical ROM. (from prior session)  Strength:  Muscle fatigue reached earlier on R>L with tall-kneeling/balance exercises.  Pelvic floor: TTP through R posterior PR/PC and OI as well as 5 o'clock region of posterior fourchette. 3/5 strength following treatment. (from prior session)  ROM/Mobility:  Hidden C6 vertebrae, decreased mobility at C6-7 with TTP. (from prior visit)  Palpation:  Highly TTP to R multifidus and erector spinae through T5-6, referring out B  Gait Analysis: (from prior session) Gait speed: 1.50m/s ; anterior pelvic tilt, knee hyperextension, heel strike - with cues pelvic neutral achieved, with appropriate arm swing and decreased heel strike. (from prior session)  Patient Outcome Measure:   INTERVENTIONS THIS SESSION: Manual: Performed TP release to R QL and B PR both anterior and posteriorly as well as performed MFR through L>R anterior wall to decrease spasm and facilitate improved motion and ability to recruit PFM to decrease POP Sx. And allow for further strengthening.   Total time: 60 min.                             PT Short Term Goals - 01/07/20 [id number]      PT SHORT TERM GOAL #1   Title  Patient will demonstrate improved pelvic alignment and balance of musculature surrounding the pelvis to facilitate decreased PFM spasms and decrease pressure on nerve roots to allow for  optimal PFM function    Baseline  Has L foot-drop and pain in L glutes/knee as well as inability to sense PFM and L posterior innominate rotation/spasms surrounding pelvis.    Time  5    Period  Weeks    Status  Achieved    Target Date  11/26/19      PT SHORT TERM GOAL #2   Title  Patient will demonstrate HEP x1 in the clinic to demonstrate understanding and proper form to allow for further improvement.    Baseline  Pt. lacks knowledge of  therapeutic ecersise that will decrease Sx.    Time  5    Period  Weeks    Status  Achieved    Target Date  11/26/19      PT SHORT TERM GOAL #3   Title  Pt. will be able to walk 250 Ft.with CGA and w/o assistive device to demonstrate improved balance/decreased instability and foot drop.    Baseline  Pt. using RW to AMB due to L foot-drop and increased instability.    Time  5    Period  Weeks    Status  Achieved    Target Date  08/20/19      PT SHORT TERM GOAL #4   Title  Patient will demonstrate a coordinated contraction, relaxation, and bulge of the pelvic floor muscles to demonstrate functional recruitment and motion and allow for further strengthening.    Baseline  Pt. reports inability to sense the PFM for kegel or relaxation, having mixed UI    Time  5    Period  Weeks    Status  Achieved    Target Date  08/20/19        PT Long Term Goals - 01/07/20 0853      PT LONG TERM GOAL #1   Title  Patient will report no episodes of UUI/SUI over the course of the prior two weeks to demonstrate improved functional ability.    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    Time  10    Period  Weeks    Status  On-going    Target Date  03/14/20      PT LONG TERM GOAL #2   Title  Patient will report no pain with intercourse to demonstrate improved functional ability.    Baseline  having mild pain with initial penetration As of 1:29: had some pain at initial penetration and with deeper thrusting at ~ 3-4/10 but "not terrible"    Time  10    Period  Weeks    Status  On-going    Target Date  03/14/20      PT LONG TERM GOAL #3   Title  Patient will score at or below 22/100 on the UDI and 27/100 on the UIQ to demonstrate a clinically meaningful decrease in disability and distress due to pelvic floor dysfunction.    Baseline  UDI: 67, UIQ: 72/100 10-19-19: UDI 55.5, UIQ 76/100; POPDI 11/100 As  of 1/29: UDI:33/100, UIQ: 6/100, POPDI:6/100    Time  10    Period  Weeks    Status  On-going    Target Date  03/14/20      PT LONG TERM GOAL #4   Title  Pt. will demonstrate appropriate gait mechanics and be able to perform ADL's without use of AD and without pain to allow  for child-care and return to work.    Baseline  Pt. requiring RW for AMB due to L foot-drop and imbalance. As of 10/12: Pt. requiring some support (e.g. grocery cart) for > 20 min. of AMB or stairs.    Time  10    Period  Weeks    Status  Achieved    Target Date  11/26/19            Plan - 04/01/20 1335    Clinical Impression Statement  Pt. Responded well to all interventions today, demonstrating decreased spasm and pain, improved PFM recruitment, as well as understanding and correct performance of all education and exercises provided today. They will continue to benefit from skilled physical therapy to work toward remaining goals and maximize function as well as decrease likelihood of symptom increase or recurrence.     PT Next Visit Plan  RE-ASSESS and re-cert, review deep-core coordination (tall kneel, knee step through, stepping) stabilize gait pattern; Internal TP release to scar at posterior fourchette, treat spinal mobility to improve diaphragmcoordination and reduce L LE radiating symptoms; TP release vs. DN to, hip-flexors, and R pectineus. re-assess for Piriformis spasm (have performed DN and TP release now); check in with long term HEP performance    PT Home Exercise Plan  A day: TeapotSide-planksPlanksDorothy's, scapular rows and I's, Y's, T's, and W's  B [id number] with leg lift Child's pose V-sit/ single leg hamstring stretch Side stretch Every day: Kegels 3-5x/day, vaginal weights, chest-stretch, tall kneeling, tall-kneeling step-through    Consulted and Agree with Plan of Care  Patient       Patient will benefit from skilled therapeutic intervention in order to improve the following deficits and  impairments:     Visit Diagnosis: Other muscle spasm  Foot drop, left  Sacrococcygeal disorders, not elsewhere classified     Problem List Patient Active Problem List   Diagnosis Date Noted  . Urinary incontinence 08/17/2019  . Foot drop, left 08/17/2019  . Maternal varicella, non-immune 11/20/2018   Willa Rough DPT, ATC Willa Rough 04/01/2020, 1:36 PM  [place] MAIN [facility] SERVICES [address] [place], [place], [address] Phone: [phone]   Fax:  [phone]  Name: Lisa Brady MRN: [id number] Date of Birth: Apr 27, 1986

## 2020-04-04 ENCOUNTER — Other Ambulatory Visit: Payer: Self-pay

## 2020-04-04 ENCOUNTER — Ambulatory Visit: Payer: No Typology Code available for payment source

## 2020-04-04 DIAGNOSIS — M62838 Other muscle spasm: Secondary | ICD-10-CM | POA: Diagnosis not present

## 2020-04-04 DIAGNOSIS — M21372 Foot drop, left foot: Secondary | ICD-10-CM

## 2020-04-04 DIAGNOSIS — M533 Sacrococcygeal disorders, not elsewhere classified: Secondary | ICD-10-CM

## 2020-04-04 NOTE — Therapy (Signed)
[place] MAIN [facility] SERVICES [address] [place], [place], [address] Phone: [phone]   Fax:  [phone]  Physical Therapy Treatment  The patient has been informed of current processes in place at Outpatient Rehab to protect patients from Covid-19 exposure including social distancing, schedule modifications, and new cleaning procedures. After discussing their particular risk with a therapist based on the patient's personal risk factors, the patient has decided to proceed with in-person therapy.   Patient Details  Name: Lisa Brady MRN: [id number] Date of Birth: 10-11-1986 Referring Provider (PT): [name]   Encounter Date: 04/04/2020  PT End of Session - 04/04/20 1126    Visit Number  [id number]    Number of Visits  43    Date for PT Re-Evaluation  06/06/20    Authorization Type  [place]    Authorization - Visit Number  1    Authorization - Number of Visits  10    Progress Note Due on Visit  10    PT Start Time  0805    PT Stop Time  0905    PT Time Calculation (min)  60 min    Activity Tolerance  Patient tolerated treatment well;No increased pain    Behavior During Therapy  WFL for tasks assessed/performed       Past Medical History:  Diagnosis Date  . Foot drop     Past Surgical History:  Procedure Laterality Date  . WISDOM TOOTH EXTRACTION  2006    There were no vitals filed for this visit.  Pelvic Floor Physical Therapy Treatment Note  SCREENING  Changes in medications, allergies, or medical history?: none    SUBJECTIVE  Patient reports: She has still been getting a lot of heaviness sensation even though hip and back pain have been getting better. She can tell that when she tilts her pelvis and draws in her core her back feels better. She wishes that her body would just do that on her own. Had leakage with vomiting and one day had some leakage with walking and has had some cough/sneeze leakage. Has had more  difficulty emptying her bladder, like the stream was smaller the last few days.  Precautions:  7.5 months PP  Pain update:  Location of pain:  L>R side of back (shoulders/neck) Current pain:  1/10 (1) Max pain:  3/10 NPS (1) Least pain:  0/10 (0) Nature of pain: tightness   Patient Goals: Control her bladder/not need depends.   OBJECTIVE  Changes in:   MMT LE  - L DF 4+/5 (different rater may account for difference but could be due to spasms in back since test performed prior to DN/manual) - R DF 5/5 (from prior session)  Observation:  Greatest TTP is near apex of thoracic curve at ~ T5-6  C2 deviated L with mild L cervical curve throughout and decreased L cervical ROM. (from prior session)  Strength:  Muscle fatigue reached earlier on R>L with tall-kneeling/balance exercises. (from prior session)  Pelvic floor: TTP through R posterior PR/PC and OI as well as 5 o'clock region of posterior fourchette. 3/5 strength following treatment. (from prior session)  ROM/Mobility:  Hidden C6 vertebrae, decreased mobility at C6-7 with TTP. (from prior visit)  Palpation:  TTP to L>R multifidus near T-L junction and at R diaphragm near mid-line.  Gait Analysis: (from prior session) Gait speed: 1.59m/s ; anterior pelvic tilt, knee hyperextension, heel strike - with cues pelvic neutral achieved, with appropriate arm swing and  decreased heel strike. (from prior session)  Patient Outcome Measure:   INTERVENTIONS THIS SESSION: Manual: Performed TP release to B paraspinals and multifidus near T-L junction and Diaphragm on R near midline. to decrease spasm and pain and allow for improved balance of musculature for improved function and decreased symptoms.  Dry-needle: Performed TPDN with a .30x80mm needle and standard approach as described below to decrease spasm and pain and allow for improved balance of musculature for improved function and decreased symptoms.  Therex: educated on  and practiced seated lean-backs to improve deep-core endurance and improve coordination of deep and superficial core musclulature.  Self-care: reviewed goals and POC. Increased focus on deep-core strengthening and postural endurance to allow for long-term maintenance.   Total time: 60 min.                   Trigger Point Dry Needling - 04/04/20 0001    Consent Given?  Yes    Education Handout Provided  No    Muscles Treated Back/Hip  Erector spinae;Lumbar multifidi;Thoracic multifidi    Dry Needling Comments  Bilateral    Electrical Stimulation Performed with Dry Needling  Yes    E-stim with Dry Needling Details  Frequency 2, muscle contraction.             PT Short Term Goals - 04/04/20 0845      PT SHORT TERM GOAL #1   Title  Patient will demonstrate improved pelvic alignment and balance of musculature surrounding the pelvis to facilitate decreased PFM spasms and decrease pressure on nerve roots to allow for optimal PFM function    Baseline  Has L foot-drop and pain in L glutes/knee as well as inability to sense PFM and L posterior innominate rotation/spasms surrounding pelvis.    Time  5    Period  Weeks    Status  Achieved    Target Date  11/26/19      PT SHORT TERM GOAL #2   Title  Patient will demonstrate HEP x1 in the clinic to demonstrate understanding and proper form to allow for further improvement.    Baseline  Pt. lacks knowledge of therapeutic ecersise that will decrease Sx.    Time  5    Period  Weeks    Status  Achieved    Target Date  11/26/19      PT SHORT TERM GOAL #3   Title  Pt. will be able to walk 250 Ft.with CGA and w/o assistive device to demonstrate improved balance/decreased instability and foot drop.    Baseline  Pt. using RW to AMB due to L foot-drop and increased instability.    Time  5    Period  Weeks    Status  Achieved    Target Date  08/20/19      PT SHORT TERM GOAL #4   Title  Patient will demonstrate a coordinated  contraction, relaxation, and bulge of the pelvic floor muscles to demonstrate functional recruitment and motion and allow for further strengthening.    Baseline  Pt. reports inability to sense the PFM for kegel or relaxation, having mixed UI    Time  5    Period  Weeks    Status  Achieved    Target Date  08/20/19        PT Long Term Goals - 04/04/20 0810      PT LONG TERM GOAL #1   Title  Patient will report no episodes of UUI/SUI over the course  of the prior two weeks to demonstrate improved functional ability.    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 As of 4/30: She had been doing better but then took a step backward when she was sick and vomiting a lot.    Time  10    Period  Weeks    Status  On-going    Target Date  06/13/20      PT LONG TERM GOAL #2   Title  Patient will report no pain with intercourse to demonstrate improved functional ability.    Baseline  having mild pain with initial penetration As of 1:29: had some pain at initial penetration and with deeper thrusting at ~ 3-4/10 but "not terrible"    Time  10    Period  Weeks    Status  Unable to assess    Target Date  06/13/20      PT LONG TERM GOAL #3   Title  Patient will score at or below 22/100 on the UDI and 27/100 on the UIQ to demonstrate a clinically meaningful decrease in disability and distress due to pelvic floor dysfunction.    Baseline  UDI: 67, UIQ: 72/100 10-19-19: UDI 55.5, UIQ 76/100; POPDI 11/100 As of 1/29: UDI:33/100, UIQ: 6/100, POPDI:6/100. As of 4/30: Due to set-back from    Time  10    Period  Weeks    Status  On-going    Target Date  06/13/20      PT LONG TERM GOAL #4   Title  Pt. 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    Baseline  Pt. requiring RW for AMB due to L foot-drop and  imbalance. As of 10/12: Pt. requiring some support (e.g. grocery cart) for > 20 min. of AMB or stairs.    Time  10    Period  Weeks    Status  Achieved    Target Date  11/25/20            Plan - 04/04/20 1127    Clinical Impression Statement  Pt. is making slow but steady progress toward her goals overall but had a recent set-back due to becoming ill and having N&V for days which caused some diaphragm spasm and increased pain and feeling of POP/SUI temporarily. She responded well to all treatments today, demonstrating decreased diaphragm spasm with some fascial tension remaining, decreased spasm/pain at T/L junction, and understanding and correct performance of her new deep-core and abdominal strengthening/endurance exercise. She will continue to benefit from skilled pelvic PT to work on further PFM and core/proximal musculature strengthening to allow for PFM to relax, to allow for maintained pain relief, and to allow for further pregnancies without severe pain/complication.    Personal Factors and Comorbidities  Comorbidity 1;Comorbidity 2    Comorbidities  scoliosis, hypermobility    Examination-Activity Limitations  Locomotion Level;Stand;Stairs;Lift;Squat;Toileting;Continence;Sit;Transfers;Caring for Others;Bend    Examination-Participation Restrictions  Interpersonal Relationship;Yard Work;Community Activity;Meal Prep;Laundry    Stability/Clinical Decision Making  Evolving/Moderate complexity    Clinical Decision Making  Moderate    Rehab Potential  Good    PT Frequency  1x / week    PT Duration  Other (comment)   10 weeks   PT Treatment/Interventions  ADLs/Self Care Home Management;Aquatic Therapy;Biofeedback;Electrical Stimulation;Gait training;Stair training;Balance training;Therapeutic  exercise;Therapeutic activities;Functional mobility training;Neuromuscular re-education;Patient/family education;Manual techniques;Scar mobilization;Dry needling;Taping;Spinal Manipulations;Joint  Manipulations    PT Next Visit Plan  review deep-core coordination (tall kneel, knee step through, stepping, lean-backs) stabilize gait pattern; Internal TP release to scar at posterior fourchette, treat spinal mobility to improve diaphragmcoordination and reduce L LE radiating symptoms; TP release vs. DN to, hip-flexors, and R pectineus. re-assess for Piriformis spasm (have performed DN and TP release now); check in with long term HEP performance    PT Home Exercise Plan  A day: TeapotSide-planksPlanksDorothy's, scapular rows and I's, Y's, T's, and W's  B [id number] with leg lift Child's pose V-sit/ single leg hamstring stretch Side stretch Every day: Kegels 3-5x/day, vaginal weights, chest-stretch, tall kneeling, tall-kneeling step-through, lean-backs    Consulted and Agree with Plan of Care  Patient       Patient will benefit from skilled therapeutic intervention in order to improve the following deficits and impairments:  Abnormal gait, Decreased balance, Decreased endurance, Difficulty walking, Increased muscle spasms, Impaired tone, Improper body mechanics, Decreased activity tolerance, Decreased coordination, Decreased strength, Hypermobility, Postural dysfunction, Pain  Visit Diagnosis: Other muscle spasm  Foot drop, left  Sacrococcygeal disorders, not elsewhere classified     Problem List Patient Active Problem List   Diagnosis Date Noted  . Urinary incontinence 08/17/2019  . Foot drop, left 08/17/2019  . Maternal varicella, non-immune 11/20/2018   Willa Rough DPT, ATC Willa Rough 04/04/2020, 11:37 AM  [place] MAIN [facility] SERVICES [address] [place], [place], [address] Phone: [phone]   Fax:  [phone]  Name: Lisa Brady MRN: [id number] Date of Birth: 1986/10/26

## 2020-04-04 NOTE — Patient Instructions (Signed)
   Sit tall and tuck the pelvis under. Start to lean back as far as you can without your back arching/making sure core is engaged. Hold as long as you can keep good form/until fatigue. Repeat 10 times per day.

## 2020-04-11 ENCOUNTER — Other Ambulatory Visit: Payer: Self-pay

## 2020-04-11 ENCOUNTER — Ambulatory Visit: Payer: No Typology Code available for payment source | Attending: Obstetrics and Gynecology

## 2020-04-11 DIAGNOSIS — M533 Sacrococcygeal disorders, not elsewhere classified: Secondary | ICD-10-CM | POA: Insufficient documentation

## 2020-04-11 DIAGNOSIS — M62838 Other muscle spasm: Secondary | ICD-10-CM | POA: Diagnosis not present

## 2020-04-11 DIAGNOSIS — M21372 Foot drop, left foot: Secondary | ICD-10-CM | POA: Insufficient documentation

## 2020-04-11 NOTE — Therapy (Addendum)
[place] MAIN [facility] SERVICES [address] [place], [place], [address] Phone: [phone]   Fax:  [phone]  Physical Therapy Treatment  The patient has been informed of current processes in place at Outpatient Rehab to protect patients from Covid-19 exposure including social distancing, schedule modifications, and new cleaning procedures. After discussing their particular risk with a therapist based on the patient's personal risk factors, the patient has decided to proceed with in-person therapy.   Patient Details  Name: Lisa Brady MRN: [id number] Date of Birth: 01/04/1986 Referring Provider (PT): [name]   Encounter Date: 04/11/2020    Past Medical History:  Diagnosis Date  . Foot drop     Past Surgical History:  Procedure Laterality Date  . WISDOM TOOTH EXTRACTION  2006    There were no vitals filed for this visit.   Pelvic Floor Physical Therapy Treatment Note  SCREENING  Changes in medications, allergies, or medical history?: none    SUBJECTIVE  Patient reports: Has still had a lot of heaviness/prolapse feeling, had a good day Wednesday but then Thursday had a bad day again after pushing for a BM and having to use force for MMT of a Pt. Had frequency and pressure. Had both cough/sneeze and even urgency at the sink. Had one leakage episode without stress, just a small amount.  Precautions:  7.5 months PP  Pain update:  Location of pain:  R hip-flexor  (shoulders/neck) Current pain:  0/10 (2/10) Max pain:  3/10 NPS (3/10) Least pain:  0/10 (0) Nature of pain: tightness   Patient Goals: Control her bladder/not need depends.   OBJECTIVE  Changes in:   MMT LE  - L DF 4+/5 (different rater may account for difference but could be due to spasms in back since test performed prior to DN/manual) - R DF 5/5 (from prior session)  Observation:  Greatest TTP is near apex of thoracic curve at ~ T5-6  C2  deviated L with mild L cervical curve throughout and decreased L cervical ROM. (from prior session)  Strength:  Muscle fatigue reached earlier on R>L with tall-kneeling/balance exercises. (from prior session)  Pelvic floor: TTP through R posterior PR/PC and OI as well as 5 o'clock region of posterior fourchette. 3/5 strength following treatment. (from prior session)  Today: decreased scar mobility at posterior fourchette. TTP to R OI, B posterior PR.  ROM/Mobility:  Hidden C6 vertebrae, decreased mobility at C6-7 with TTP. (from prior visit)  Palpation:  TTP to to R iliacus and pectineus  Gait Analysis: (from prior session) Gait speed: 1.88m/s ; anterior pelvic tilt, knee hyperextension, heel strike - with cues pelvic neutral achieved, with appropriate arm swing and decreased heel strike. (from prior session)  Patient Outcome Measure:   INTERVENTIONS THIS SESSION: Manual: Performed TP release and STM to R iliacus and pectineus to decrease spasm and pain and allow for improved balance of musculature for improved function and decreased symptoms.  Dry-needle: Performed TPDN with a .30x192mm needle and standard approach as described below to decrease spasm and pain and allow for improved balance of musculature for improved function and decreased symptoms.  Therex: educated on and practiced seated lean-backs to improve deep-core endurance and improve coordination of deep and superficial core musclulature.  Total time: 60 min.                    Trigger Point Dry Needling - 04/17/20 0001    Consent Given?  Yes  Education Handout Provided  No    Muscles Treated Back/Hip  Iliacus    Dry Needling Comments  right    Iliacus Response  Twitch response elicited;Palpable increased muscle length             PT Short Term Goals - 04/04/20 0845      PT SHORT TERM GOAL #1   Title  Patient will demonstrate improved pelvic alignment and balance of musculature  surrounding the pelvis to facilitate decreased PFM spasms and decrease pressure on nerve roots to allow for optimal PFM function    Baseline  Has L foot-drop and pain in L glutes/knee as well as inability to sense PFM and L posterior innominate rotation/spasms surrounding pelvis.    Time  5    Period  Weeks    Status  Achieved    Target Date  11/26/19      PT SHORT TERM GOAL #2   Title  Patient will demonstrate HEP x1 in the clinic to demonstrate understanding and proper form to allow for further improvement.    Baseline  Pt. lacks knowledge of therapeutic ecersise that will decrease Sx.    Time  5    Period  Weeks    Status  Achieved    Target Date  11/26/19      PT SHORT TERM GOAL #3   Title  Pt. will be able to walk 250 Ft.with CGA and w/o assistive device to demonstrate improved balance/decreased instability and foot drop.    Baseline  Pt. using RW to AMB due to L foot-drop and increased instability.    Time  5    Period  Weeks    Status  Achieved    Target Date  08/20/19      PT SHORT TERM GOAL #4   Title  Patient will demonstrate a coordinated contraction, relaxation, and bulge of the pelvic floor muscles to demonstrate functional recruitment and motion and allow for further strengthening.    Baseline  Pt. reports inability to sense the PFM for kegel or relaxation, having mixed UI    Time  5    Period  Weeks    Status  Achieved    Target Date  08/20/19        PT Long Term Goals - 04/04/20 0810      PT LONG TERM GOAL #1   Title  Patient will report no episodes of UUI/SUI over the course of the prior two weeks to demonstrate improved functional ability.    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 As of 4/30: She had been doing better but then took a step backward when she was sick and vomiting a lot.    Time  10    Period  Weeks    Status  On-going    Target Date  06/13/20       PT LONG TERM GOAL #2   Title  Patient will report no pain with intercourse to demonstrate improved functional ability.    Baseline  having mild pain with initial penetration As of 1:29: had some pain at initial penetration and with deeper thrusting at ~ 3-4/10 but "not terrible"    Time  10    Period  Weeks    Status  Unable to assess    Target Date  06/13/20      PT LONG TERM GOAL #3  Title  Patient will score at or below 22/100 on the UDI and 27/100 on the UIQ to demonstrate a clinically meaningful decrease in disability and distress due to pelvic floor dysfunction.    Baseline  UDI: 67, UIQ: 72/100 10-19-19: UDI 55.5, UIQ 76/100; POPDI 11/100 As of 1/29: UDI:33/100, UIQ: 6/100, POPDI:6/100. As of 4/30: Due to set-back from    Time  10    Period  Weeks    Status  On-going    Target Date  06/13/20      PT LONG TERM GOAL #4   Title  Pt. 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    Baseline  Pt. requiring RW for AMB due to L foot-drop and imbalance. As of 10/12: Pt. requiring some support (e.g. grocery cart) for > 20 min. of AMB or stairs.    Time  10    Period  Weeks    Status  Achieved    Target Date  11/25/20            Plan - 04/11/20 1142    Clinical Impression Statement  Pt. Responded well to all interventions today, demonstrating decreased spasm and increased scar tissue length internally and increased PFM recruitment/strength as well as understanding and correct performance of all education and exercises provided today. They will continue to benefit from skilled physical therapy to work toward remaining goals and maximize function as well as decrease likelihood of symptom increase or recurrence.   PT Next Visit Plan  review deep-core coordination (tall kneel, knee step through, stepping, lean-backs) stabilize gait pattern; Internal TP release to scar at  posterior fourchette, treat spinal mobility to improve diaphragmcoordination and reduce L LE radiating symptoms; TP release vs. DN to, hip-flexors, and R pectineus. re-assess for Piriformis spasm (have performed DN and TP release now); check in with long term HEP performance    PT Home Exercise Plan  A day: TeapotSide-planksPlanksDorothy's, scapular rows and I's, Y's, T's, and W's  B [id number] with leg lift Child's pose V-sit/ single leg hamstring stretch Side stretch Every day: Kegels 3-5x/day, vaginal weights, chest-stretch, tall kneeling, tall-kneeling step-through, lean-backs    Consulted and Agree with Plan of Care  Patient       Patient will benefit from skilled therapeutic intervention in order to improve the following deficits and impairments:     Visit Diagnosis: Other muscle spasm  Foot drop, left  Sacrococcygeal disorders, not elsewhere classified     Problem List Patient Active Problem List   Diagnosis Date Noted  . Urinary incontinence 08/17/2019  . Foot drop, left 08/17/2019  . Maternal varicella, non-immune 11/20/2018   Willa Rough DPT, ATC Willa Rough 04/17/2020, 11:56 AM  [place] MAIN [facility] SERVICES [address] [place], [place], [address] Phone: [phone]   Fax:  [phone]  Name: Lisa Brady MRN: [id number] Date of Birth: 1985/12/17

## 2020-04-18 ENCOUNTER — Other Ambulatory Visit: Payer: Self-pay

## 2020-04-18 ENCOUNTER — Ambulatory Visit: Payer: No Typology Code available for payment source

## 2020-04-18 DIAGNOSIS — M533 Sacrococcygeal disorders, not elsewhere classified: Secondary | ICD-10-CM

## 2020-04-18 DIAGNOSIS — M62838 Other muscle spasm: Secondary | ICD-10-CM

## 2020-04-18 DIAGNOSIS — M21372 Foot drop, left foot: Secondary | ICD-10-CM

## 2020-04-18 NOTE — Therapy (Signed)
[place] MAIN [facility] SERVICES [address] [place], [place], [address] Phone: [phone]   Fax:  [phone]  Physical Therapy Treatment  The patient has been informed of current processes in place at Outpatient Rehab to protect patients from Covid-19 exposure including social distancing, schedule modifications, and new cleaning procedures. After discussing their particular risk with a therapist based on the patient's personal risk factors, the patient has decided to proceed with in-person therapy.   Patient Details  Name: Lisa Brady MRN: [id number] Date of Birth: [birth year] Referring Provider (PT): [name]   Encounter Date: 04/18/2020  PT End of Session - 04/18/20 1017    Visit Number  36    Number of Visits  43    Date for PT Re-Evaluation  06/06/20    Authorization Type  [employer]    Authorization - Visit Number  3    Authorization - Number of Visits  10    Progress Note Due on Visit  10    PT Start Time  0807    PT Stop Time  0907    PT Time Calculation (min)  60 min    Activity Tolerance  Patient tolerated treatment well;No increased pain    Behavior During Therapy  WFL for tasks assessed/performed       Past Medical History:  Diagnosis Date  . Foot drop     Past Surgical History:  Procedure Laterality Date  . WISDOM TOOTH EXTRACTION  2006    There were no vitals filed for this visit.   Pelvic Floor Physical Therapy Treatment Note  SCREENING  Changes in medications, allergies, or medical history?: none    SUBJECTIVE  Patient reports: Had a couple "cough/sneeze leakage and more urgency again when washing hands or brushing teeth again. Kegels have felt weak over the last week or so. Having intermittent LBP/achiness. Her quads on the R feels more intense/tight.  Precautions:  7.5 months PP  Pain update:  Location of pain:  Low back (shoulders/neck) Current pain:  2/10 (2/10) Max pain:  3/10 NPS  (3/10) Least pain:  0/10 (0) Nature of pain: tightness  **has soreness in L>R hip/LB from needling following treatment.  Patient Goals: Control her bladder/not need depends.   OBJECTIVE  Changes in:   MMT LE  - L DF 4+/5 (different rater may account for difference but could be due to spasms in back since test performed prior to DN/manual) - R DF 5/5 (from prior session)  Observation:  L up-slip, R anterior rotation.  C2 deviated L with mild L cervical curve throughout and decreased L cervical ROM. (from prior session)  Strength:  Muscle fatigue reached earlier on R>L with tall-kneeling/balance exercises. (from prior session)  Pelvic floor: TTP through R posterior PR/PC and OI as well as 5 o'clock region of posterior fourchette. 3/5 strength following treatment. (from prior session)  Today: decreased scar mobility at posterior fourchette. TTP to R OI, B posterior PR.  ROM/Mobility:  Hidden C6 vertebrae, decreased mobility at C6-7 with TTP. (from prior visit)  Palpation:  TTP to to L Iliacus and B QL and lumbar multifidus near L5.  Gait Analysis: (from prior session) Gait speed: 1.40m/s ; anterior pelvic tilt, knee hyperextension, heel strike - with cues pelvic neutral achieved, with appropriate arm swing and decreased heel strike. (from prior session)  Patient Outcome Measure:   INTERVENTIONS THIS SESSION: Manual: Performed TP release and STM to  L Iliacus and B QL and lumbar  multifidus near L5 followed by L up-slip correction to decrease spasm and pain and allow for improved balance of musculature for improved function and decreased symptoms.  Dry-needle: Performed TPDN with a .30x173mm needle and standard approach as described below to decrease spasm and pain and allow for improved balance of musculature for improved function and decreased symptoms.  Total time: 60 min.                     Trigger Point Dry Needling - 04/18/20 0001    Consent  Given?  Yes    Education Handout Provided  No    Muscles Treated Back/Hip  Erector spinae;Lumbar multifidi;Quadratus lumborum    Erector spinae Response  Twitch response elicited;Palpable increased muscle length    Lumbar multifidi Response  Twitch response elicited;Palpable increased muscle length    Quadratus Lumborum Response  Twitch response elicited;Palpable increased muscle length             PT Short Term Goals - 04/04/20 0845      PT SHORT TERM GOAL #1   Title  Patient will demonstrate improved pelvic alignment and balance of musculature surrounding the pelvis to facilitate decreased PFM spasms and decrease pressure on nerve roots to allow for optimal PFM function    Baseline  Has L foot-drop and pain in L glutes/knee as well as inability to sense PFM and L posterior innominate rotation/spasms surrounding pelvis.    Time  5    Period  Weeks    Status  Achieved    Target Date  11/26/19      PT SHORT TERM GOAL #2   Title  Patient will demonstrate HEP x1 in the clinic to demonstrate understanding and proper form to allow for further improvement.    Baseline  Pt. lacks knowledge of therapeutic ecersise that will decrease Sx.    Time  5    Period  Weeks    Status  Achieved    Target Date  11/26/19      PT SHORT TERM GOAL #3   Title  Pt. will be able to walk 250 Ft.with CGA and w/o assistive device to demonstrate improved balance/decreased instability and foot drop.    Baseline  Pt. using RW to AMB due to L foot-drop and increased instability.    Time  5    Period  Weeks    Status  Achieved    Target Date  08/20/19      PT SHORT TERM GOAL #4   Title  Patient will demonstrate a coordinated contraction, relaxation, and bulge of the pelvic floor muscles to demonstrate functional recruitment and motion and allow for further strengthening.    Baseline  Pt. reports inability to sense the PFM for kegel or relaxation, having mixed UI    Time  5    Period  Weeks    Status   Achieved    Target Date  08/20/19        PT Long Term Goals - 04/04/20 0810      PT LONG TERM GOAL #1   Title  Patient will report no episodes of UUI/SUI over the course of the prior two weeks to demonstrate improved functional ability.    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 As of 4/30: She had been doing better but then took a step backward when she was sick  and vomiting a lot.    Time  10    Period  Weeks    Status  On-going    Target Date  06/13/20      PT LONG TERM GOAL #2   Title  Patient will report no pain with intercourse to demonstrate improved functional ability.    Baseline  having mild pain with initial penetration As of 1:29: had some pain at initial penetration and with deeper thrusting at ~ 3-4/10 but "not terrible"    Time  10    Period  Weeks    Status  Unable to assess    Target Date  06/13/20      PT LONG TERM GOAL #3   Title  Patient will score at or below 22/100 on the UDI and 27/100 on the UIQ to demonstrate a clinically meaningful decrease in disability and distress due to pelvic floor dysfunction.    Baseline  UDI: 67, UIQ: 72/100 10-19-19: UDI 55.5, UIQ 76/100; POPDI 11/100 As of 1/29: UDI:33/100, UIQ: 6/100, POPDI:6/100. As of 4/30: Due to set-back from    Time  10    Period  Weeks    Status  On-going    Target Date  06/13/20      PT LONG TERM GOAL #4   Title  Pt. 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    Baseline  Pt. requiring RW for AMB due to L foot-drop and imbalance. As of 10/12: Pt. requiring some support (e.g. grocery cart) for > 20 min. of AMB or stairs.    Time  10    Period  Weeks    Status  Achieved    Target Date  11/25/20            Plan - 04/18/20 1017    Clinical Impression Statement  Pt. Responded well to all  interventions today, demonstrating improved pelvic alignment and decreased spasm/TTP as well as understanding and correct performance of all education and exercises provided today. They will continue to benefit from skilled physical therapy to work toward remaining goals and maximize function as well as decrease likelihood of symptom increase or recurrence.     PT Next Visit Plan  re-assess internally, review deep-core coordination (tall kneel, knee step through, stepping, lean-backs) stabilize gait pattern; Internal TP release to scar at posterior fourchette, treat spinal mobility to improve diaphragmcoordination and reduce L LE radiating symptoms; TP release vs. DN to, hip-flexors, and R pectineus. re-assess for Piriformis spasm (have performed DN and TP release now); check in with long term HEP performance    PT Home Exercise Plan  A day: TeapotSide-planksPlanksDorothy's, scapular rows and I's, Y's, T's, and W's  B [id number] with leg lift Child's pose V-sit/ single leg hamstring stretch Side stretch Every day: Kegels 3-5x/day, vaginal weights, chest-stretch, tall kneeling, tall-kneeling step-through, lean-backs    Consulted and Agree with Plan of Care  Patient       Patient will benefit from skilled therapeutic intervention in order to improve the following deficits and impairments:     Visit Diagnosis: Other muscle spasm  Foot drop, left  Sacrococcygeal disorders, not elsewhere classified     Problem List Patient Active Problem List   Diagnosis Date Noted  . Urinary incontinence 08/17/2019  . Foot drop, left 08/17/2019  . Maternal varicella, non-immune 11/20/2018   Willa Rough DPT, ATC Cyndra Numbers  Lollie Sails 04/18/2020, 10:18 AM  [place] MAIN [facility] SERVICES [address] [place], [place], [address] Phone: [phone]   Fax:  [phone]  Name: Maneet Mcduffey MRN: [id number] Date of Birth: 10-16-1986

## 2020-04-25 ENCOUNTER — Ambulatory Visit: Payer: No Typology Code available for payment source

## 2020-04-25 ENCOUNTER — Other Ambulatory Visit: Payer: Self-pay

## 2020-04-25 DIAGNOSIS — M62838 Other muscle spasm: Secondary | ICD-10-CM | POA: Diagnosis not present

## 2020-04-25 DIAGNOSIS — M21372 Foot drop, left foot: Secondary | ICD-10-CM

## 2020-04-25 DIAGNOSIS — M533 Sacrococcygeal disorders, not elsewhere classified: Secondary | ICD-10-CM

## 2020-04-25 NOTE — Therapy (Signed)
[place] MAIN [facility] SERVICES [address] [place], [place], [address] Phone: [phone]   Fax:  [phone]  Physical Therapy Treatment  The patient has been informed of current processes in place at Outpatient Rehab to protect patients from Covid-19 exposure including social distancing, schedule modifications, and new cleaning procedures. After discussing their particular risk with a therapist based on the patient's personal risk factors, the patient has decided to proceed with in-person therapy.   Patient Details  Name: Lisa Brady MRN: [id number] Date of Birth: 1986/05/25 Referring Provider (PT): [name]   Encounter Date: 04/25/2020  PT End of Session - 04/25/20 1036    Visit Number  37    Number of Visits  43    Date for PT Re-Evaluation  06/06/20    Authorization Type  [employer]    Authorization - Visit Number  4    Authorization - Number of Visits  10    Progress Note Due on Visit  10    PT Start Time  0800    PT Stop Time  0905    PT Time Calculation (min)  65 min    Activity Tolerance  Patient tolerated treatment well;No increased pain    Behavior During Therapy  WFL for tasks assessed/performed       Past Medical History:  Diagnosis Date  . Foot drop     Past Surgical History:  Procedure Laterality Date  . WISDOM TOOTH EXTRACTION  2006    There were no vitals filed for this visit.   Pelvic Floor Physical Therapy Treatment Note  SCREENING  Changes in medications, allergies, or medical history?: none    SUBJECTIVE  Patient reports: Had a couple bad days. Tried to do a kegel one time and feels like she did the exact opposite. The baby has had an ear infection so she has done a lot of sitting in the rocker holding him as he slept. Is leaving for [place] today. Tried doing her kegels with the weights and it helped her feel how to do them correctly. Hip has not been bothering her much all week. Maybe a  .5 . She is travelling to [place] for ~ 10 days with the baby and will have to carry him in the carrier some. She noticed immediate urge to pee when she tried putting him in it recently.  Precautions:  7.5 months PP  Pain update:  Location of pain:  Low back (shoulders/neck) Current pain:  2/10 (2/10) Max pain:  3/10 NPS (3/10) Least pain:  0/10 (0) Nature of pain: tightness  **has soreness in L>R hip/LB from needling following treatment.  Patient Goals: Control her bladder/not need depends.   OBJECTIVE  Changes in:   MMT LE  - L DF 4+/5 (different rater may account for difference but could be due to spasms in back since test performed prior to DN/manual) - R DF 5/5 (from prior session)  Observation:  L up-slip, R anterior rotation.  C2 deviated L with mild L cervical curve throughout and decreased L cervical ROM. (from prior session)  Strength:  Muscle fatigue reached earlier on R>L with tall-kneeling/balance exercises. (from prior session)  Pelvic floor: TTP through R posterior PR/PC and OI as well as 5 o'clock region of posterior fourchette. 3/5 strength following treatment. (from prior session)  Today: TTP to to L posterior fourchette at ~ 5 o'clock. Coordinated and able to keep probe in even at ~ 45 degrees. Increased force production  when TA and obliques recruited. ~ 3+/5 strength and maximal endurance at ~ 8 seconds.  ROM/Mobility:  Hidden C6 vertebrae, decreased mobility at C6-7 with TTP. (from prior visit)  Palpation:  TTP to R>L diaphragm.   Gait Analysis: (from prior session) Gait speed: 1.75m/s ; anterior pelvic tilt, knee hyperextension, heel strike - with cues pelvic neutral achieved, with appropriate arm swing and decreased heel strike. (from prior session)  Patient Outcome Measure:   INTERVENTIONS THIS SESSION: Manual: Performed TP release and STM to TTP to to L posterior fourchette at ~ 5 o'clock and R>L diaphragm to decrease spasm and pain,  increase ability to recruit obliques and diaphragm correctly, and allow for improved balance of musculature for improved function and decreased symptoms.  Biofeedback/NM re-ed: Reviewed kegels with use of biofeedback for visual and auditory feedback and TC and VC to increase TA and obliques recruitment with kegel to achieve maximal recruitment/force creation. Reviewed posterior pelvic tilt with pillow and added emphasis on recruiting obliques to maintain deep-core engagement and increase deep-core coordination.  Total time: 60 min.                             PT Short Term Goals - 04/04/20 0845      PT SHORT TERM GOAL #1   Title  Patient will demonstrate improved pelvic alignment and balance of musculature surrounding the pelvis to facilitate decreased PFM spasms and decrease pressure on nerve roots to allow for optimal PFM function    Baseline  Has L foot-drop and pain in L glutes/knee as well as inability to sense PFM and L posterior innominate rotation/spasms surrounding pelvis.    Time  5    Period  Weeks    Status  Achieved    Target Date  11/26/19      PT SHORT TERM GOAL #2   Title  Patient will demonstrate HEP x1 in the clinic to demonstrate understanding and proper form to allow for further improvement.    Baseline  Pt. lacks knowledge of therapeutic ecersise that will decrease Sx.    Time  5    Period  Weeks    Status  Achieved    Target Date  11/26/19      PT SHORT TERM GOAL #3   Title  Pt. will be able to walk 250 Ft.with CGA and w/o assistive device to demonstrate improved balance/decreased instability and foot drop.    Baseline  Pt. using RW to AMB due to L foot-drop and increased instability.    Time  5    Period  Weeks    Status  Achieved    Target Date  08/20/19      PT SHORT TERM GOAL #4   Title  Patient will demonstrate a coordinated contraction, relaxation, and bulge of the pelvic floor muscles to demonstrate functional recruitment and  motion and allow for further strengthening.    Baseline  Pt. reports inability to sense the PFM for kegel or relaxation, having mixed UI    Time  5    Period  Weeks    Status  Achieved    Target Date  08/20/19        PT Long Term Goals - 04/04/20 0810      PT LONG TERM GOAL #1   Title  Patient will report no episodes of UUI/SUI over the course of the prior two weeks to demonstrate improved functional ability.  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 As of 4/30: She had been doing better but then took a step backward when she was sick and vomiting a lot.    Time  10    Period  Weeks    Status  On-going    Target Date  06/13/20      PT LONG TERM GOAL #2   Title  Patient will report no pain with intercourse to demonstrate improved functional ability.    Baseline  having mild pain with initial penetration As of 1:29: had some pain at initial penetration and with deeper thrusting at ~ 3-4/10 but "not terrible"    Time  10    Period  Weeks    Status  Unable to assess    Target Date  06/13/20      PT LONG TERM GOAL #3   Title  Patient will score at or below 22/100 on the UDI and 27/100 on the UIQ to demonstrate a clinically meaningful decrease in disability and distress due to pelvic floor dysfunction.    Baseline  UDI: 67, UIQ: 72/100 10-19-19: UDI 55.5, UIQ 76/100; POPDI 11/100 As of 1/29: UDI:33/100, UIQ: 6/100, POPDI:6/100. As of 4/30: Due to set-back from    Time  10    Period  Weeks    Status  On-going    Target Date  06/13/20      PT LONG TERM GOAL #4   Title  Pt. 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    Baseline  Pt. requiring RW for AMB due to L foot-drop and imbalance. As of 10/12: Pt. requiring some support (e.g. grocery cart) for > 20 min. of AMB or stairs.     Time  10    Period  Weeks    Status  Achieved    Target Date  11/25/20            Plan - 04/25/20 1036    Clinical Impression Statement  Pt. Responded well to all interventions today, demonstrating improved PFM strength and consistency of coordination and deep-core recruitment as well as understanding and correct performance of all education and exercises provided today. They will continue to benefit from skilled physical therapy to work toward remaining goals and maximize function as well as decrease likelihood of symptom increase or recurrence.     PT Next Visit Plan  re-assess rib mobility and obliques/diaphragm, re-assess internally, review deep-core coordination (tall kneel, knee step through, stepping, lean-backs) stabilize gait pattern; Internal TP release to scar at posterior fourchette, treat spinal mobility to improve diaphragmcoordination and reduce L LE radiating symptoms; TP release vs. DN to, hip-flexors, and R pectineus. re-assess for Piriformis spasm (have performed DN and TP release now); check in with long term HEP performance    PT Home Exercise Plan  A day: TeapotSide-planksPlanksDorothy's, scapular rows and I's, Y's, T's, and W's  B [id number] with leg lift Child's pose V-sit/ single leg hamstring stretch Side stretch Every day: Kegels 3-5x/day, vaginal weights, chest-stretch, tall kneeling, tall-kneeling step-through, lean-backs    Consulted and Agree with Plan of Care  Patient       Patient will benefit from skilled therapeutic intervention in order to improve the following deficits and impairments:     Visit Diagnosis: Other  muscle spasm  Foot drop, left  Sacrococcygeal disorders, not elsewhere classified     Problem List Patient Active Problem List   Diagnosis Date Noted  . Urinary incontinence 08/17/2019  . Foot drop, left 08/17/2019  . Maternal varicella, non-immune 11/20/2018   Willa Rough DPT, ATC Willa Rough 04/25/2020, 10:40  AM  [place] MAIN [facility] SERVICES [address] [place], [place], [address] Phone: [phone]   Fax:  [phone]  Name: Lisa Brady MRN: [id number] Date of Birth: 08-Mar-1986

## 2020-05-09 ENCOUNTER — Other Ambulatory Visit: Payer: Self-pay

## 2020-05-09 ENCOUNTER — Ambulatory Visit: Payer: No Typology Code available for payment source | Attending: Obstetrics and Gynecology

## 2020-05-09 DIAGNOSIS — M21372 Foot drop, left foot: Secondary | ICD-10-CM | POA: Diagnosis present

## 2020-05-09 DIAGNOSIS — M533 Sacrococcygeal disorders, not elsewhere classified: Secondary | ICD-10-CM | POA: Insufficient documentation

## 2020-05-09 DIAGNOSIS — M62838 Other muscle spasm: Secondary | ICD-10-CM | POA: Insufficient documentation

## 2020-05-09 NOTE — Therapy (Addendum)
[place] MAIN [facility] SERVICES [address] [place], [place], [address] Phone: [phone]   Fax:  [phone]  Physical Therapy Treatment  The patient has been informed of current processes in place at Outpatient Rehab to protect patients from Covid-19 exposure including social distancing, schedule modifications, and new cleaning procedures. After discussing their particular risk with a therapist based on the patient's personal risk factors, the patient has decided to proceed with in-person therapy.  Patient Details  Name: Lisa Brady MRN: [id number] Date of Birth: 02/25/1986 Referring Provider (PT): [name]   Encounter Date: 05/09/2020  PT End of Session - 05/14/20 3220    Number of Visits  43    Date for PT Re-Evaluation  06/06/20    Authorization Type  [employer]    Authorization - Visit Number  5    Authorization - Number of Visits  10    Progress Note Due on Visit  10    PT Start Time  0800    PT Stop Time  0900    PT Time Calculation (min)  60 min    Activity Tolerance  Patient tolerated treatment well;No increased pain    Behavior During Therapy  WFL for tasks assessed/performed       Past Medical History:  Diagnosis Date  . Foot drop     Past Surgical History:  Procedure Laterality Date  . WISDOM TOOTH EXTRACTION  2006    There were no vitals filed for this visit.   Pelvic Floor Physical Therapy Treatment Note  SCREENING  Changes in medications, allergies, or medical history?: none    SUBJECTIVE  Patient reports: Trip was nice, had a few cough/sneeze "things" one time that she had to actually change "everything" had some urgency when she went on a walk in a hilly area. Still getting urgency with leaning forward to wash her hands but not leaking. Was not feeling much drop/pressure until the last few days when she was picking up the baby more. Had intercourse without much burning, maybe a little discomfort  with deeper thrusting and a little tightness with initial penetration but just kind of like when she had first had intercourse. Can do Kegels really well at ~ 10-20 degrees and was able to keep it in for ~ 2 seconds in kneeling.  Precautions:  8 months PP  Pain update:  Location of pain:  Low back (shoulders/neck) Current pain:  1/10 (0/10) Max pain:  3/10 NPS (4/10) Least pain:  0/10 (0/10) Nature of pain: tightness  **no increased pain following treatment  Patient Goals: Control her bladder/not need depends.   OBJECTIVE  Changes in:   MMT LE  - L DF 4+/5 (different rater may account for difference but could be due to spasms in back since test performed prior to DN/manual) - R DF 5/5 (from prior session)  Observation:  C2 deviated L with mild L cervical curve throughout and decreased L cervical ROM. (from prior session)  L rib ~ 9 anteriorly shifted,   Strength:  Muscle fatigue reached earlier on R>L with tall-kneeling/balance exercises. (from prior session)  Pelvic floor: TTP through R posterior PR/PC and OI as well as 5 o'clock region of posterior fourchette. 3/5 strength following treatment. (from prior session)  Today: TTP to to L posterior fourchette at ~ 5 o'clock. Coordinated and able to keep probe in even at ~ 45 degrees. Increased force production when TA and obliques recruited. ~ 3+/5 strength and maximal endurance  at ~ 8 seconds.  ROM/Mobility:  Hidden C6 vertebrae, decreased mobility at C6-7 with TTP. (from prior visit)  Decreased mobility through L>R ribs  Palpation:  TTP to  L diaphragm and intercostals at ~ ribs 7-10.   Gait Analysis: (from prior session) Gait speed: 1.40m/s ; anterior pelvic tilt, knee hyperextension, heel strike - with cues pelvic neutral achieved, with appropriate arm swing and decreased heel strike.     INTERVENTIONS THIS SESSION: Manual: Performed TP release toL diaphragm and intercostals at ~ ribs 7-10 followed by grade 3-4  mobilizations through the L ribs ~ 7-10 to decrease spasm and pain, increase ability to recruit obliques and diaphragm correctly, and allow for improved balance of musculature for improved posture, function, and decreased symptoms.  Therex: reviewed and practiced posterior pelvic tilts with emphasis on recruiting obliques to allow for greater deep-core coordination and strength to decrease POP.  Total time: 60 min.                            PT Short Term Goals - 04/04/20 0845      PT SHORT TERM GOAL #1   Title  Patient will demonstrate improved pelvic alignment and balance of musculature surrounding the pelvis to facilitate decreased PFM spasms and decrease pressure on nerve roots to allow for optimal PFM function    Baseline  Has L foot-drop and pain in L glutes/knee as well as inability to sense PFM and L posterior innominate rotation/spasms surrounding pelvis.    Time  5    Period  Weeks    Status  Achieved    Target Date  11/26/19      PT SHORT TERM GOAL #2   Title  Patient will demonstrate HEP x1 in the clinic to demonstrate understanding and proper form to allow for further improvement.    Baseline  Pt. lacks knowledge of therapeutic ecersise that will decrease Sx.    Time  5    Period  Weeks    Status  Achieved    Target Date  11/26/19      PT SHORT TERM GOAL #3   Title  Pt. will be able to walk 250 Ft.with CGA and w/o assistive device to demonstrate improved balance/decreased instability and foot drop.    Baseline  Pt. using RW to AMB due to L foot-drop and increased instability.    Time  5    Period  Weeks    Status  Achieved    Target Date  08/20/19      PT SHORT TERM GOAL #4   Title  Patient will demonstrate a coordinated contraction, relaxation, and bulge of the pelvic floor muscles to demonstrate functional recruitment and motion and allow for further strengthening.    Baseline  Pt. reports inability to sense the PFM for kegel or relaxation,  having mixed UI    Time  5    Period  Weeks    Status  Achieved    Target Date  08/20/19        PT Long Term Goals - 04/04/20 0810      PT LONG TERM GOAL #1   Title  Patient will report no episodes of UUI/SUI over the course of the prior two weeks to demonstrate improved functional ability.    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 As  of 4/30: She had been doing better but then took a step backward when she was sick and vomiting a lot.    Time  10    Period  Weeks    Status  On-going    Target Date  06/13/20      PT LONG TERM GOAL #2   Title  Patient will report no pain with intercourse to demonstrate improved functional ability.    Baseline  having mild pain with initial penetration As of 1:29: had some pain at initial penetration and with deeper thrusting at ~ 3-4/10 but "not terrible"    Time  10    Period  Weeks    Status  Unable to assess    Target Date  06/13/20      PT LONG TERM GOAL #3   Title  Patient will score at or below 22/100 on the UDI and 27/100 on the UIQ to demonstrate a clinically meaningful decrease in disability and distress due to pelvic floor dysfunction.    Baseline  UDI: 67, UIQ: 72/100 10-19-19: UDI 55.5, UIQ 76/100; POPDI 11/100 As of 1/29: UDI:33/100, UIQ: 6/100, POPDI:6/100. As of 4/30: Due to set-back from    Time  10    Period  Weeks    Status  On-going    Target Date  06/13/20      PT LONG TERM GOAL #4   Title  Pt. 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    Baseline  Pt. requiring RW for AMB due to L foot-drop and imbalance. As of 10/12: Pt. requiring some support (e.g. grocery cart) for > 20 min. of AMB or stairs.    Time  10    Period  Weeks    Status  Achieved    Target Date  11/25/20            Plan - 05/14/20 1610     Clinical Impression Statement  Pt. Responded well to all interventions today, demonstrating improved obliques recruitment, rib mobility, and decreased spasms as well as understanding and correct performance of all education and exercises provided today. They will continue to benefit from skilled physical therapy to work toward remaining goals and maximize function as well as decrease likelihood of symptom increase or recurrence.    PT Next Visit Plan  re-assess rib mobility and obliques/diaphragm, re-assess internally, review deep-core coordination (tall kneel, knee step through, stepping, lean-backs) stabilize gait pattern; Internal TP release to scar at posterior fourchette, treat spinal mobility to improve diaphragmcoordination and reduce L LE radiating symptoms; TP release vs. DN to, hip-flexors, and R pectineus. re-assess for Piriformis spasm (have performed DN and TP release now); check in with long term HEP performance    PT Home Exercise Plan  A day: TeapotSide-planksPlanksDorothy's, scapular rows and I's, Y's, T's, and W's  B [id number] with leg lift Child's pose V-sit/ single leg hamstring stretch Side stretch Every day: Kegels 3-5x/day, vaginal weights, chest-stretch, tall kneeling, tall-kneeling step-through, lean-backs    Consulted and Agree with Plan of Care  Patient       Patient will benefit from skilled therapeutic intervention in order to improve the following deficits and impairments:     Visit Diagnosis: Other muscle spasm  Foot drop, left  Sacrococcygeal disorders, not elsewhere classified     Problem List Patient Active Problem List   Diagnosis Date Noted  . Urinary  incontinence 08/17/2019  . Foot drop, left 08/17/2019  . Maternal varicella, non-immune 11/20/2018   Willa Rough DPT, ATC Willa Rough 05/14/2020, 9:24 AM  [place] MAIN [facility] SERVICES [address] [place], [place], [address] Phone: [phone]   Fax:   [phone]  Name: Tahiri Shareef MRN: [id number] Date of Birth: [birth year]

## 2020-05-16 ENCOUNTER — Ambulatory Visit: Payer: No Typology Code available for payment source

## 2020-05-16 ENCOUNTER — Other Ambulatory Visit: Payer: Self-pay

## 2020-05-16 DIAGNOSIS — M533 Sacrococcygeal disorders, not elsewhere classified: Secondary | ICD-10-CM

## 2020-05-16 DIAGNOSIS — M21372 Foot drop, left foot: Secondary | ICD-10-CM

## 2020-05-16 DIAGNOSIS — M62838 Other muscle spasm: Secondary | ICD-10-CM

## 2020-05-16 NOTE — Therapy (Addendum)
[place] MAIN [facility] SERVICES [address] [place], [place], [address] Phone: [phone]   Fax:  [phone]  Physical Therapy Treatment  The patient has been informed of current processes in place at [facility]patient Rehab to protect patients from Covid-19 exposure including social distancing, schedule modifications, and new cleaning procedures. After discussing their particular risk with a therapist based on the patient's personal risk factors, the patient has decided to proceed with in-person therapy.   Patient Details  Name: Lisa Brady MRN: [id number] Date of Birth: June 27, 1986 Referring Provider (PT): [name]   Encounter Date: 05/16/2020   PT End of Session - 05/26/20 0736    Visit Number 39    Number of Visits 43    Date for PT Re-Evaluation 06/06/20    Authorization Type [place]    Authorization - Visit Number 6    Authorization - Number of Visits 10    Progress Note Due on Visit 10    PT Start Time 0800    PT Stop Time 0900    PT Time Calculation (min) 60 min    Activity Tolerance Patient tolerated treatment well;No increased pain    Behavior During Therapy WFL for tasks assessed/performed           Past Medical History:  Diagnosis Date  . Foot drop     Past Surgical History:  Procedure Laterality Date  . WISDOM TOOTH EXTRACTION  2006    There were no vitals filed for this visit.   Pelvic Floor Physical Therapy Treatment Note  SCREENING  Changes in medications, allergies, or medical history?: none    SUBJECTIVE  Patient reports: Has felt like things have been going pretty well this week regardless of the fact that she has not been as good about doing her HEP because the baby is not sleeping well. Had some SUI with "pretty intense sneezing fit". Feels like she can notice that the "tail end" is stronger with her kegels since the rib mobility/ diaphragm release last session. Has a point in her shoulder  that has been really tender, does not know whether it is from not doing her seated rows.  Precautions:  8 months PP  Pain update:  Location of pain:  Low back (shoulders/neck) Current pain:  1/10 (0/10) Max pain:  3/10 NPS (4/10) Least pain:  0/10 (0/10) Nature of pain: tightness  **no increased pain following treatment  Patient Goals: Control her bladder/not need depends.   OBJECTIVE  Changes in:   MMT LE  - L DF 4+/5 (different rater may account for difference but could be due to spasms in back since test performed prior to DN/manual) - R DF 5/5 (from prior session)  Observation:  C2 deviated L with mild L cervical curve throughout and decreased L cervical ROM. (from prior session)  R rib ~ 7 prominent, costovertebral angle prominent on R   Strength:  Muscle fatigue reached earlier on R>L with tall-kneeling/balance exercises. (from prior session)  Pelvic floor: TTP through R posterior PR/PC and OI as well as 5 o'clock region of posterior fourchette. TTP to to L posterior fourchette at ~ 5 o'clock. Coordinated and able to keep probe in even at ~ 45 degrees. Increased force production when TA and obliques recruited. ~ 3+/5 strength and maximal endurance at ~ 8 seconds. (from prior session)  ROM/Mobility:  Hidden C6 vertebrae, decreased mobility at C6-7 with TTP. (from prior visit)  Decreased mobility through R>L ribs  Palpation:  TTP to and intercostals at R rhomboids and ribs ~4-8.   Gait Analysis: (from prior session) Gait speed: 1.79m/s ; anterior pelvic tilt, knee hyperextension, heel strike - with cues pelvic neutral achieved, with appropriate arm swing and decreased heel strike.     INTERVENTIONS THIS SESSION: Manual: Performed TP release to R Rhomboids and intercostals at ~ ribs 4-8 followed by grade 3-4 mobilizations through the R ribs ~6-10 to decrease spasm and pain, increase ability to recruit obliques and diaphragm correctly, and allow for improved  balance of musculature for improved posture, function, and decreased symptoms.  Therex: reviewed and practiced scapular retractions and prone I's Y's, T's and W's to allow for improved HEP efficacy and scapular stability following increased mobility/motion.  Total time: 60 min.                             PT Short Term Goals - 04/04/20 0845      PT SHORT TERM GOAL #1   Title Patient will demonstrate improved pelvic alignment and balance of musculature surrounding the pelvis to facilitate decreased PFM spasms and decrease pressure on nerve roots to allow for optimal PFM function    Baseline Has L foot-drop and pain in L glutes/knee as well as inability to sense PFM and L posterior innominate rotation/spasms surrounding pelvis.    Time 5    Period Weeks    Status Achieved    Target Date 11/26/19      PT SHORT TERM GOAL #2   Title Patient will demonstrate HEP x1 in the clinic to demonstrate understanding and proper form to allow for further improvement.    Baseline Pt. lacks knowledge of therapeutic ecersise that will decrease Sx.    Time 5    Period Weeks    Status Achieved    Target Date 11/26/19      PT SHORT TERM GOAL #3   Title Pt. will be able to walk 250 Ft.with CGA and w/o assistive device to demonstrate improved balance/decreased instability and foot drop.    Baseline Pt. using RW to AMB due to L foot-drop and increased instability.    Time 5    Period Weeks    Status Achieved    Target Date 08/20/19      PT SHORT TERM GOAL #4   Title Patient will demonstrate a coordinated contraction, relaxation, and bulge of the pelvic floor muscles to demonstrate functional recruitment and motion and allow for further strengthening.    Baseline Pt. reports inability to sense the PFM for kegel or relaxation, having mixed UI    Time 5    Period Weeks    Status Achieved    Target Date 08/20/19             PT Long Term Goals - 04/04/20 0810      PT LONG  TERM GOAL #1   Title Patient will report no episodes of UUI/SUI over the course of the prior two weeks to demonstrate improved functional ability.    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 As of 4/30: She had been doing better but then took a step backward when she was sick and vomiting a lot.    Time 10    Period Weeks    Status On-going    Target Date 06/13/20  PT LONG TERM GOAL #2   Title Patient will report no pain with intercourse to demonstrate improved functional ability.    Baseline having mild pain with initial penetration As of 1:29: had some pain at initial penetration and with deeper thrusting at ~ 3-4/10 but "not terrible"    Time 10    Period Weeks    Status Unable to assess    Target Date 06/13/20      PT LONG TERM GOAL #3   Title Patient will score at or below 22/100 on the UDI and 27/100 on the UIQ to demonstrate a clinically meaningful decrease in disability and distress due to pelvic floor dysfunction.    Baseline UDI: 67, UIQ: 72/100 10-19-19: UDI 55.5, UIQ 76/100; POPDI 11/100 As of 1/29: UDI:33/100, UIQ: 6/100, POPDI:6/100. As of 4/30: Due to set-back from    Time 10    Period Weeks    Status On-going    Target Date 06/13/20      PT LONG TERM GOAL #4   Title Pt. 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    Baseline Pt. requiring RW for AMB due to L foot-drop and imbalance. As of 10/12: Pt. requiring some support (e.g. grocery cart) for > 20 min. of AMB or stairs.    Time 10    Period Weeks    Status Achieved    Target Date 11/25/20                 Plan - 05/27/20 1227    Clinical Impression Statement Pt. Responded well to all interventions today, demonstrating improved rib mobility, decreased TTP/spasm, and improved ability to recruit scapular  stabilizers for HEP, as well as understanding and correct performance of all education and exercises provided today. They will continue to benefit from skilled physical therapy to work toward remaining goals and maximize function as well as decrease likelihood of symptom increase or recurrence.    PT Next Visit Plan re-assess rib mobility and obliques/diaphragm, re-assess internally, review deep-core coordination (tall kneel, knee step through, stepping, lean-backs) stabilize gait pattern; Internal TP release to scar at posterior fourchette, treat spinal mobility to improve diaphragmcoordination and reduce L LE radiating symptoms; TP release vs. DN to, hip-flexors, and R pectineus. re-assess for Piriformis spasm (have performed DN and TP release now); check in with long term HEP performance    PT Home Exercise Plan A day: TeapotSide-planksPlanksDorothy's, scapular rows and I's, Y's, T's, and W's  B [id number] with leg lift Child's pose V-sit/ single leg hamstring stretch Side stretch Every day: Kegels 3-5x/day, vaginal weights, chest-stretch, tall kneeling, tall-kneeling step-through, lean-backs    Consulted and Agree with Plan of Care Patient           Patient will benefit from skilled therapeutic intervention in order to improve the following deficits and impairments:     Visit Diagnosis: Other muscle spasm  Foot drop, left  Sacrococcygeal disorders, not elsewhere classified     Problem List Patient Active Problem List   Diagnosis Date Noted  . Urinary incontinence 08/17/2019  . Foot drop, left 08/17/2019  . Maternal varicella, non-immune 11/20/2018   Willa Rough DPT, ATC Willa Rough 05/27/2020, 12:47 PM  [place] MAIN [facility] SERVICES [address] [place], [place], [address] Phone: [phone]   Fax:  [phone]  Name: Lisa Brady MRN: [id number]  Date of Birth: [birth year]

## 2020-05-23 ENCOUNTER — Other Ambulatory Visit: Payer: Self-pay

## 2020-05-23 ENCOUNTER — Ambulatory Visit: Payer: No Typology Code available for payment source

## 2020-05-23 DIAGNOSIS — M62838 Other muscle spasm: Secondary | ICD-10-CM

## 2020-05-23 DIAGNOSIS — M533 Sacrococcygeal disorders, not elsewhere classified: Secondary | ICD-10-CM

## 2020-05-23 DIAGNOSIS — M21372 Foot drop, left foot: Secondary | ICD-10-CM

## 2020-05-23 NOTE — Therapy (Addendum)
[place] MAIN [facility] SERVICES [address] [place], [place], [address] Phone: [phone]   Fax:  [phone]  Physical Therapy Treatment  The patient has been informed of current processes in place at Outpatient Rehab to protect patients from Covid-19 exposure including social distancing, schedule modifications, and new cleaning procedures. After discussing their particular risk with a therapist based on the patient's personal risk factors, the patient has decided to proceed with in-person therapy.   Patient Details  Name: Lisa Brady MRN: [id number] Date of Birth: 01-11-1986 Referring Provider (PT): [name]   Encounter Date: 05/23/2020   PT End of Session - 05/27/20 1249    Visit Number 40    Number of Visits 43    Date for PT Re-Evaluation 06/06/20    Authorization Type [employer]    Authorization - Visit Number 7    Authorization - Number of Visits 10    Progress Note Due on Visit 10    PT Start Time 0800    PT Stop Time 0900    PT Time Calculation (min) 60 min    Activity Tolerance Patient tolerated treatment well;No increased pain    Behavior During Therapy WFL for tasks assessed/performed           Past Medical History:  Diagnosis Date  . Foot drop     Past Surgical History:  Procedure Laterality Date  . WISDOM TOOTH EXTRACTION  2006    There were no vitals filed for this visit.   Pelvic Floor Physical Therapy Treatment Note  SCREENING  Changes in medications, allergies, or medical history?: none    SUBJECTIVE  Patient reports: Her son had an ear infection this week. She is doing pretty well with urinary Sx. Except this morning she had a little bit of leakage because she had been a little constipated and had to strain. Her neck has been bothering her a little bit more this week on the L>R. Still "pretty sensitive" in the R shoulder and it feels heavier when she is trying to do hie "I's, Y's, and T's"  Has been trying to do Kegels first thing in the morning. "for the most part things are better/good". She is picking up leland a lot more, getting him in and out of the carseat, things that were really problematic the first 6 months.  Precautions:  8 months PP  Pain update:  Location of pain:  R neck/shoulder (shoulders/neck) Current pain:  2/10  Max pain:  4/10 NPS  Least pain:  0/10  Nature of pain: tightness/achy  **TTP but decreased pinchy/sharpness with motion following treatment.  Patient Goals: Control her bladder/not need depends.   OBJECTIVE  Changes in:   MMT LE  - L DF 4+/5 (different rater may account for difference but could be due to spasms in back since test performed prior to DN/manual) - R DF 5/5 (from prior session)  Observation:  C5-6 are anteriorly shifted, R thoracic urve prominent.   Strength:  Muscle fatigue reached earlier on R>L with tall-kneeling/balance exercises. (from prior session)  Pelvic floor: TTP through R posterior PR/PC and OI as well as 5 o'clock region of posterior fourchette. TTP to to L posterior fourchette at ~ 5 o'clock. Coordinated and able to keep probe in even at ~ 45 degrees. Increased force production when TA and obliques recruited. ~ 3+/5 strength and maximal endurance at ~ 8 seconds.  (from prior session)   ROM/Mobility:  Hidden C6 vertebrae, decreased mobility  at C6-7 with TTP. And 3rd rib on R  Palpation:  TTP to R serratus, rhomboids, levator and Pec..   Gait Analysis: (from prior session) Gait speed: 1.59m/s ; anterior pelvic tilt, knee hyperextension, heel strike - with cues pelvic neutral achieved, with appropriate arm swing and decreased heel strike.     INTERVENTIONS THIS SESSION: Manual: Performed TP release through R serratus, rhomboids, levator and Pec. PA mobs to 3rd rib costovertebral joint and C7 as well as distraction/C5 block and chin-tuck MWM to improve mobility and alignment of C-T junction and decrease  spasms and pain into neck, shoulder and arm.   Therex: Educated on and practiced doing chin-tucks with cervical flexion to mobilize C5-6-7 and strengthen into improved alignment.  Total time: 60 min.                              PT Short Term Goals - 04/04/20 0845      PT SHORT TERM GOAL #1   Title Patient will demonstrate improved pelvic alignment and balance of musculature surrounding the pelvis to facilitate decreased PFM spasms and decrease pressure on nerve roots to allow for optimal PFM function    Baseline Has L foot-drop and pain in L glutes/knee as well as inability to sense PFM and L posterior innominate rotation/spasms surrounding pelvis.    Time 5    Period Weeks    Status Achieved    Target Date 11/26/19      PT SHORT TERM GOAL #2   Title Patient will demonstrate HEP x1 in the clinic to demonstrate understanding and proper form to allow for further improvement.    Baseline Pt. lacks knowledge of therapeutic ecersise that will decrease Sx.    Time 5    Period Weeks    Status Achieved    Target Date 11/26/19      PT SHORT TERM GOAL #3   Title Pt. will be able to walk 250 Ft.with CGA and w/o assistive device to demonstrate improved balance/decreased instability and foot drop.    Baseline Pt. using RW to AMB due to L foot-drop and increased instability.    Time 5    Period Weeks    Status Achieved    Target Date 08/20/19      PT SHORT TERM GOAL #4   Title Patient will demonstrate a coordinated contraction, relaxation, and bulge of the pelvic floor muscles to demonstrate functional recruitment and motion and allow for further strengthening.    Baseline Pt. reports inability to sense the PFM for kegel or relaxation, having mixed UI    Time 5    Period Weeks    Status Achieved    Target Date 08/20/19             PT Long Term Goals - 04/04/20 0810      PT LONG TERM GOAL #1   Title Patient will report no episodes of UUI/SUI over the  course of the prior two weeks to demonstrate improved functional ability.    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 As of 4/30: She had been doing better but then took a step backward when she was sick and vomiting a lot.    Time 10    Period Weeks    Status On-going    Target Date 06/13/20  PT LONG TERM GOAL #2   Title Patient will report no pain with intercourse to demonstrate improved functional ability.    Baseline having mild pain with initial penetration As of 1:29: had some pain at initial penetration and with deeper thrusting at ~ 3-4/10 but "not terrible"    Time 10    Period Weeks    Status Unable to assess    Target Date 06/13/20      PT LONG TERM GOAL #3   Title Patient will score at or below 22/100 on the UDI and 27/100 on the UIQ to demonstrate a clinically meaningful decrease in disability and distress due to pelvic floor dysfunction.    Baseline UDI: 67, UIQ: 72/100 10-19-19: UDI 55.5, UIQ 76/100; POPDI 11/100 As of 1/29: UDI:33/100, UIQ: 6/100, POPDI:6/100. As of 4/30: Due to set-back from    Time 10    Period Weeks    Status On-going    Target Date 06/13/20      PT LONG TERM GOAL #4   Title Pt. 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    Baseline Pt. requiring RW for AMB due to L foot-drop and imbalance. As of 10/12: Pt. requiring some support (e.g. grocery cart) for > 20 min. of AMB or stairs.    Time 10    Period Weeks    Status Achieved    Target Date 11/25/20                 Plan - 05/27/20 1249    Clinical Impression Statement Pt. Responded well to all interventions today, demonstrating improved C-T junction mobility, decreased spasm/TTP, as well as understanding and correct performance of all education and exercises provided today. They will  continue to benefit from skilled physical therapy to work toward remaining goals and maximize function as well as decrease likelihood of symptom increase or recurrence.     PT Next Visit Plan re-assess rib mobility and obliques/diaphragm, re-assess internally, review deep-core coordination (tall kneel, knee step through, stepping, lean-backs) stabilize gait pattern; Internal TP release to scar at posterior fourchette, treat spinal mobility to improve diaphragmcoordination and reduce L LE radiating symptoms; TP release vs. DN to, hip-flexors, and R pectineus. re-assess for Piriformis spasm (have performed DN and TP release now); check in with long term HEP performance    PT Home Exercise Plan A day: TeapotSide-planksPlanksDorothy's, scapular rows and I's, Y's, T's, and W's  B [id number] with leg lift Child's pose V-sit/ single leg hamstring stretch Side stretch Every day: Kegels 3-5x/day, vaginal weights, chest-stretch, tall kneeling, tall-kneeling step-through, lean-backs    Consulted and Agree with Plan of Care Patient           Patient will benefit from skilled therapeutic intervention in order to improve the following deficits and impairments:     Visit Diagnosis: Other muscle spasm  Foot drop, left  Sacrococcygeal disorders, not elsewhere classified     Problem List Patient Active Problem List   Diagnosis Date Noted  . Urinary incontinence 08/17/2019  . Foot drop, left 08/17/2019  . Maternal varicella, non-immune 11/20/2018   Willa Rough DPT, ATC Willa Rough 05/27/2020, 12:57 PM  [place] MAIN [facility] SERVICES [address] [place], [place], [address] Phone: [phone]   Fax:  [phone]  Name: Marica Trentham MRN: [id number] Date of Birth: 28-Jul-1986

## 2020-05-30 ENCOUNTER — Ambulatory Visit: Payer: No Typology Code available for payment source

## 2020-05-30 ENCOUNTER — Other Ambulatory Visit: Payer: Self-pay

## 2020-05-30 DIAGNOSIS — M62838 Other muscle spasm: Secondary | ICD-10-CM | POA: Diagnosis not present

## 2020-05-30 DIAGNOSIS — M533 Sacrococcygeal disorders, not elsewhere classified: Secondary | ICD-10-CM

## 2020-05-30 DIAGNOSIS — M21372 Foot drop, left foot: Secondary | ICD-10-CM

## 2020-05-30 NOTE — Patient Instructions (Addendum)
Bridges    Exhale and pull the low back into the table before using the glutes to bridge your hips up off of the mat, just to the point that you can continue to keep the pelvis neutral. Inhale slightly at the top and then exhale to roll back down one spinal segment at a time, bring the upper then middle, then low back before finally letting the bottom relax down.  Do 2x10, 1 time per day.   Hold for 5 deep breaths, repeat 2-3 times.   Hold for 5 deep breaths, repeat 2-3 times.   Exhale and pull shoulder blades back and down as palms rotate upward. Do 2x15

## 2020-05-30 NOTE — Therapy (Signed)
[place] MAIN [facility] SERVICES [address] [place], [place], [address] Phone: [phone]   Fax:  [phone]  Physical Therapy Treatment  The patient has been informed of current processes in place at Outpatient Rehab to protect patients from Covid-19 exposure including social distancing, schedule modifications, and new cleaning procedures. After discussing their particular risk with a therapist based on the patient's personal risk factors, the patient has decided to proceed with in-person therapy.  Patient Details  Name: Lisa Brady MRN: [id number] Date of Birth: 11/29/1986 Referring Provider (PT): [name]   Encounter Date: 05/30/2020   PT End of Session - 05/30/20 0917    Visit Number 41    Number of Visits 43    Date for PT Re-Evaluation 06/06/20    Authorization Type [place]    Authorization - Visit Number 8    Authorization - Number of Visits 10    Progress Note Due on Visit 10    PT Start Time 0800    PT Stop Time 0900    PT Time Calculation (min) 60 min    Activity Tolerance Patient tolerated treatment well;No increased pain    Behavior During Therapy WFL for tasks assessed/performed           Past Medical History:  Diagnosis Date   Foot drop     Past Surgical History:  Procedure Laterality Date   WISDOM TOOTH EXTRACTION  2006    There were no vitals filed for this visit.    Pelvic Floor Physical Therapy Treatment Note  SCREENING  Changes in medications, allergies, or medical history?: none    SUBJECTIVE  Patient reports: " I basically broke half way through the week. Didn't take medicine for it but thought about it. Also had leakage of urine without sensory awareness one day. Yesterday has a little urgency and thinks it was because she had coffee but no water then had a hard cider. Almost leaked on the way to the bathroom. Baby is getting heavier but that is about the only thing she can think  of.    Precautions:  8 months PP  Pain update:  Location of pain:  R hip (shoulders/neck) Current pain:  2/10 (0/10) Max pain:  4/10  (2/10) Least pain:  0/10 (0/10) Nature of pain: tightness/achy  **no pain in hip following treatment, slightly tender from treatment through R shoulder.  Patient Goals: Control her bladder/not need depends.   OBJECTIVE  Changes in:   MMT LE  - L DF 4+/5 (different rater may account for difference but could be due to spasms in back since test performed prior to DN/manual) - R DF 5/5 (from prior session)  Observation:  R anterior/L posterior rotation at pelvis.  Protracted and elevated R scapula.  Strength:  Muscle fatigue reached earlier on R>L with tall-kneeling/balance exercises. (from prior session)  Imbalance surrounding R scapula not allowing Pt. To perform I's, Y's, T's W's well.  Pelvic floor: TTP through R posterior PR/PC and OI as well as 5 o'clock region of posterior fourchette. TTP to to L posterior fourchette at ~ 5 o'clock. Coordinated and able to keep probe in even at ~ 45 degrees. Increased force production when TA and obliques recruited. ~ 3+/5 strength and maximal endurance at ~ 8 seconds.  (from prior session)   ROM/Mobility:  Decreased scapular depression and downward rotation ROM in R>L scapula.  Palpation:  TTP to R Teres major,minor, subscapularis, levator scap and Pecs,.  Gait Analysis: (from prior session) Gait speed: 1.86ms ; anterior pelvic tilt, knee hyperextension, heel strike - with cues pelvic neutral achieved, with appropriate arm swing and decreased heel strike.     INTERVENTIONS THIS SESSION: Manual: Performed MET correction for R anterior innominate to improve pelvic alignment and decrease Sx. Performed TP release through R Teres major,minor, subscapularis, levator scap and Pecs to decrease spasm and pain and allow for improved balance of musculature for improved function and decreased symptoms.    Therex: Progressed from pelvic tilts on pillow to pilates-style bridges to improve deep-core and pelvic stability and help prevent return of pelvic obliquity. Educated on and practiced levator-scapula and sleeper stretches to help balance rotator cuff musculature and allow for improved recruitment/function at the scapulohumeral joint. Reviewed seated rows with band and instructed Pt. To replace I's ,Y's etc. With this until we are able to improve she scapulohumeral rhythm.  Total time: 60 min.                                PT Short Term Goals - 04/04/20 0845      PT SHORT TERM GOAL #1   Title Patient will demonstrate improved pelvic alignment and balance of musculature surrounding the pelvis to facilitate decreased PFM spasms and decrease pressure on nerve roots to allow for optimal PFM function    Baseline Has L foot-drop and pain in L glutes/knee as well as inability to sense PFM and L posterior innominate rotation/spasms surrounding pelvis.    Time 5    Period Weeks    Status Achieved    Target Date 11/26/19      PT SHORT TERM GOAL #2   Title Patient will demonstrate HEP x1 in the clinic to demonstrate understanding and proper form to allow for further improvement.    Baseline Pt. lacks knowledge of therapeutic ecersise that will decrease Sx.    Time 5    Period Weeks    Status Achieved    Target Date 11/26/19      PT SHORT TERM GOAL #3   Title Pt. will be able to walk 250 Ft.with CGA and w/o assistive device to demonstrate improved balance/decreased instability and foot drop.    Baseline Pt. using RW to AMB due to L foot-drop and increased instability.    Time 5    Period Weeks    Status Achieved    Target Date 08/20/19      PT SHORT TERM GOAL #4   Title Patient will demonstrate a coordinated contraction, relaxation, and bulge of the pelvic floor muscles to demonstrate functional recruitment and motion and allow for further strengthening.     Baseline Pt. reports inability to sense the PFM for kegel or relaxation, having mixed UI    Time 5    Period Weeks    Status Achieved    Target Date 08/20/19             PT Long Term Goals - 04/04/20 0810      PT LONG TERM GOAL #1   Title Patient will report no episodes of UUI/SUI over the course of the prior two weeks to demonstrate improved functional ability.    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 As of 4/30: She had been doing better but then took a step backward when she  was sick and vomiting a lot.    Time 10    Period Weeks    Status On-going    Target Date 06/13/20      PT LONG TERM GOAL #2   Title Patient will report no pain with intercourse to demonstrate improved functional ability.    Baseline having mild pain with initial penetration As of 1:29: had some pain at initial penetration and with deeper thrusting at ~ 3-4/10 but "not terrible"    Time 10    Period Weeks    Status Unable to assess    Target Date 06/13/20      PT LONG TERM GOAL #3   Title Patient will score at or below 22/100 on the UDI and 27/100 on the UIQ to demonstrate a clinically meaningful decrease in disability and distress due to pelvic floor dysfunction.    Baseline UDI: 67, UIQ: 72/100 10-19-19: UDI 55.5, UIQ 76/100; POPDI 11/100 As of 1/29: UDI:33/100, UIQ: 6/100, POPDI:6/100. As of 4/30: Due to set-back from    Time 10    Period Weeks    Status On-going    Target Date 06/13/20      PT LONG TERM GOAL #4   Title Pt. 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    Baseline Pt. requiring RW for AMB due to L foot-drop and imbalance. As of 10/12: Pt. requiring some support (e.g. grocery cart) for > 20 min. of AMB or stairs.    Time 10    Period Weeks    Status Achieved    Target Date  11/25/20                 Plan - 05/30/20 0917    Clinical Impression Statement Pt. Responded well to all interventions today, demonstrating decreased spasm and TTP and improvement in scapular position at rest as well as understanding and correct performance of all education and exercises provided today. She continues to have difficulty overcoming muscular imbalance functionally with HEP. They will continue to benefit from skilled physical therapy to work toward remaining goals and maximize function as well as decrease likelihood of symptom increase or recurrence.    PT Next Visit Plan further release to R rotator cuff/scapular stabilizers and perform PNF for the scapula and rainbows on the R.re-assess rib mobility and obliques/diaphragm, re-assess internally, review deep-core coordination (tall kneel, knee step through, stepping, lean-backs) stabilize gait pattern; Internal TP release to scar at posterior fourchette, treat spinal mobility to improve diaphragmcoordination and reduce L LE radiating symptoms; TP release vs. DN to, hip-flexors, and R pectineus. re-assess for Piriformis spasm (have performed DN and TP release now); check in with long term HEP performance    PT Home Exercise Plan A day: TeapotSide-planksPlanksDorothy's, scapular rows and I's, Y's, T's, and W's (temp. rows only and stretch levator scap on R.  B [id number] with leg lift Child's pose V-sit/ single leg hamstring stretch Side stretch Every day: Pilates bridges,Kegels 3-5x/day, vaginal weights, chest-stretch, tall kneeling, tall-kneeling step-through, lean-backs    Consulted and Agree with Plan of Care Patient           Patient will benefit from skilled therapeutic intervention in order to improve the following deficits and impairments:     Visit Diagnosis: Other muscle spasm  Foot drop, left  Sacrococcygeal disorders, not elsewhere classified     Problem List Patient Active Problem  List   Diagnosis Date  Noted   Urinary incontinence 08/17/2019   Foot drop, left 08/17/2019   Maternal varicella, non-immune 11/20/2018   Willa Rough DPT, ATC Willa Rough 05/30/2020, 9:21 AM  [place] MAIN [facility] SERVICES [address] [place], [place], [address] Phone: [phone]   Fax:  [phone]  Name: Lisa Brady MRN: [id number] Date of Birth: 1986-06-01

## 2020-06-06 ENCOUNTER — Ambulatory Visit: Payer: No Typology Code available for payment source | Attending: Obstetrics and Gynecology

## 2020-06-06 ENCOUNTER — Other Ambulatory Visit: Payer: Self-pay

## 2020-06-06 DIAGNOSIS — M21372 Foot drop, left foot: Secondary | ICD-10-CM | POA: Insufficient documentation

## 2020-06-06 DIAGNOSIS — M62838 Other muscle spasm: Secondary | ICD-10-CM | POA: Insufficient documentation

## 2020-06-06 DIAGNOSIS — M533 Sacrococcygeal disorders, not elsewhere classified: Secondary | ICD-10-CM | POA: Diagnosis present

## 2020-06-06 NOTE — Therapy (Signed)
[place] MAIN [facility] SERVICES [address] [place], [place], [address] Phone: [phone]   Fax:  [phone]  Physical Therapy Treatment  The patient has been informed of current processes in place at Outpatient Rehab to protect patients from Covid-19 exposure including social distancing, schedule modifications, and new cleaning procedures. After discussing their particular risk with a therapist based on the patient's personal risk factors, the patient has decided to proceed with in-person therapy.   Patient Details  Name: Lisa Brady MRN: [id number] Date of Birth: 1986-09-16 Referring Provider (PT): [name]   Encounter Date: 06/06/2020   PT End of Session - 06/06/20 0908    Visit Number 42    Number of Visits 43    Date for PT Re-Evaluation 06/06/20    Authorization Type [place]    Authorization - Visit Number 9    Authorization - Number of Visits 10    Progress Note Due on Visit 10    PT Start Time 0805    PT Stop Time 0905    PT Time Calculation (min) 60 min    Activity Tolerance Patient tolerated treatment well;No increased pain    Behavior During Therapy WFL for tasks assessed/performed           Past Medical History:  Diagnosis Date  . Foot drop     Past Surgical History:  Procedure Laterality Date  . WISDOM TOOTH EXTRACTION  2006    There were no vitals filed for this visit.   Pelvic Floor Physical Therapy Treatment Note  SCREENING  Changes in medications, allergies, or medical history?: none    SUBJECTIVE  Patient reports: Things are going "pretty good" doesn't know if she had any leakage over the past week. Has occasionally definitely felt the heaviness. Had some this morning and was able to do a few tilts to get it a little better. Had an incident where she had a very full bladder and was able to get home safely and go to the bathroom. Has been noticing a little bit of frequency, has been  getting up in the middle of the night occasionally. Unsure about her chest-stretch positioning.  Precautions:  8 months PP  Pain update:  Location of pain:  L hip/side (shoulders/neck) Current pain:  3/10 (0/10) Max pain:  3/10  (2/10) Least pain:  0/10 (0/10) Nature of pain: tightness/achy  **  Patient Goals: Control her bladder/not need depends.   OBJECTIVE  Changes in:   MMT LE  - L DF 4+/5 (different rater may account for difference but could be due to spasms in back since test performed prior to DN/manual) - R DF 5/5 (from prior session)  Observation:  R anterior/L posterior rotation at pelvis.  Protracted and elevated R scapula.  Strength:  Muscle fatigue reached earlier on R>L with tall-kneeling/balance exercises. (from prior session)  Imbalance surrounding R scapula not allowing Pt. To perform I's, Y's, T's W's well.  Pelvic floor: TTP through R posterior PR/PC and OI as well as 5 o'clock region of posterior fourchette. TTP to to L posterior fourchette at ~ 5 o'clock. Coordinated and able to keep probe in even at ~ 45 degrees. Increased force production when TA and obliques recruited. ~ 3+/5 strength and maximal endurance at ~ 8 seconds.  (from prior session)   ROM/Mobility:  Decreased scapular depression and downward rotation ROM in R>L scapula.  Palpation:  TTP to R Teres major,minor, subscapularis, levator scap and Pecs,.  Gait Analysis: (from prior session) Gait speed: 1.24m/s ; anterior pelvic tilt, knee hyperextension, heel strike - with cues pelvic neutral achieved, with appropriate arm swing and decreased heel strike.     INTERVENTIONS THIS SESSION: Manual: Performed TP release and STM to R Subscapularis, Teres minor, teres major, Pec. Major and Pec. Minor to decrease spasms in muscles that counteract the scapular retractors/downward rotators to allow reciprocal inhibition to improve balance of musculature surrounding the pelvis and improve overall  posture for optimal musculature length-tension relationship and function.  Dry-needle: Performed TPDN with a .30x3mm needle and standard approach as described below to decrease spasm and pain and allow for improved balance of musculature for improved function and decreased symptoms.   Therex:used I's, Y's, and T's as a test-re-test to determine if scapular rhythm restored and instructed pt. To continue with rows and scapular retractions for now.  Self-care: Discussed urinary urge norms and how to use urge suppression to help increase bladder capacity PRN, how to elevate feet before bed to allow for any swelling to process before going to bed to decrease nocturia and discussed the possibility that PFM spasms could be giving false urinary urge if present.  Total time: 60 min.                      Trigger Point Dry Needling - 06/06/20 0001    Consent Given? Yes    Education Handout Provided No    Muscles Treated Upper Quadrant Pectoralis major;Pectoralis minor;Subscapularis;Teres major;Teres minor    Pectoralis Major Response Twitch response elicited;Palpable increased muscle length    Pectoralis Minor Response Twitch response elicited;Palpable increased muscle length    Subscapularis Response Twitch response elicited;Palpable increased muscle length    Teres major Response Twitch response elicited;Palpable increased muscle length    Teres minor Response Twitch response elicited;Palpable increased muscle length                  PT Short Term Goals - 04/04/20 0845      PT SHORT TERM GOAL #1   Title Patient will demonstrate improved pelvic alignment and balance of musculature surrounding the pelvis to facilitate decreased PFM spasms and decrease pressure on nerve roots to allow for optimal PFM function    Baseline Has L foot-drop and pain in L glutes/knee as well as inability to sense PFM and L posterior innominate rotation/spasms surrounding pelvis.    Time 5     Period Weeks    Status Achieved    Target Date 11/26/19      PT SHORT TERM GOAL #2   Title Patient will demonstrate HEP x1 in the clinic to demonstrate understanding and proper form to allow for further improvement.    Baseline Pt. lacks knowledge of therapeutic ecersise that will decrease Sx.    Time 5    Period Weeks    Status Achieved    Target Date 11/26/19      PT SHORT TERM GOAL #3   Title Pt. will be able to walk 250 Ft.with CGA and w/o assistive device to demonstrate improved balance/decreased instability and foot drop.    Baseline Pt. using RW to AMB due to L foot-drop and increased instability.    Time 5    Period Weeks    Status Achieved    Target Date 08/20/19      PT SHORT TERM GOAL #4   Title Patient will demonstrate a coordinated contraction, relaxation, and bulge of the pelvic floor muscles  to demonstrate functional recruitment and motion and allow for further strengthening.    Baseline Pt. reports inability to sense the PFM for kegel or relaxation, having mixed UI    Time 5    Period Weeks    Status Achieved    Target Date 08/20/19             PT Long Term Goals - 04/04/20 0810      PT LONG TERM GOAL #1   Title Patient will report no episodes of UUI/SUI over the course of the prior two weeks to demonstrate improved functional ability.    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 As of 4/30: She had been doing better but then took a step backward when she was sick and vomiting a lot.    Time 10    Period Weeks    Status On-going    Target Date 06/13/20      PT LONG TERM GOAL #2   Title Patient will report no pain with intercourse to demonstrate improved functional ability.    Baseline having mild pain with initial penetration As of 1:29: had some pain at initial penetration and with deeper thrusting at ~ 3-4/10 but "not terrible"    Time 10    Period Weeks     Status Unable to assess    Target Date 06/13/20      PT LONG TERM GOAL #3   Title Patient will score at or below 22/100 on the UDI and 27/100 on the UIQ to demonstrate a clinically meaningful decrease in disability and distress due to pelvic floor dysfunction.    Baseline UDI: 67, UIQ: 72/100 10-19-19: UDI 55.5, UIQ 76/100; POPDI 11/100 As of 1/29: UDI:33/100, UIQ: 6/100, POPDI:6/100. As of 4/30: Due to set-back from    Time 10    Period Weeks    Status On-going    Target Date 06/13/20      PT LONG TERM GOAL #4   Title Pt. 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    Baseline Pt. requiring RW for AMB due to L foot-drop and imbalance. As of 10/12: Pt. requiring some support (e.g. grocery cart) for > 20 min. of AMB or stairs.    Time 10    Period Weeks    Status Achieved    Target Date 11/25/20                 Plan - 06/06/20 0909    Clinical Impression Statement Pt. Responded well to all interventions today, demonstrating decreased spasm of the anterior R shoulder (though she still does not have sufficient strength to perform I's and Y's without her shoulder elevating), improved understanding of bladder filling and emptying norms and how to improve urgency/frequency as well as understanding and correct performance of all education and exercises provided today. They will continue to benefit from skilled physical therapy to work toward remaining goals and maximize function as well as decrease likelihood of symptom increase or recurrence.    PT Next Visit Plan Re-assess goals and send re-cert,TPDN to R posterior scapular stabilizers and perform PNF for the scapula and rainbows on the R.re-assess rib mobility and obliques/diaphragm, re-assess internally, review deep-core coordination (tall kneel, knee step through, stepping, lean-backs) stabilize gait pattern; Internal TP release  to  scar at posterior fourchette, treat spinal mobility to improve diaphragmcoordination and reduce L LE radiating symptoms; TP release vs. DN to, hip-flexors, and R pectineus. re-assess for Piriformis spasm (have performed DN and TP release now); check in with long term HEP performance    PT Home Exercise Plan A day: TeapotSide-planksPlanksDorothy's, scapular rows and I's, Y's, T's, and W's (temp. rows only and stretch levator scap on R.  B [id number] with leg lift Child's pose V-sit/ single leg hamstring stretch Side stretch Every day: Pilates bridges,Kegels 3-5x/day, vaginal weights, chest-stretch, tall kneeling, tall-kneeling step-through, lean-backs    Consulted and Agree with Plan of Care Patient           Patient will benefit from skilled therapeutic intervention in order to improve the following deficits and impairments:     Visit Diagnosis: Other muscle spasm  Foot drop, left  Sacrococcygeal disorders, not elsewhere classified     Problem List Patient Active Problem List   Diagnosis Date Noted  . Urinary incontinence 08/17/2019  . Foot drop, left 08/17/2019  . Maternal varicella, non-immune 11/20/2018   Willa Rough DPT, ATC Willa Rough 06/06/2020, 9:11 AM  [place] MAIN [facility] SERVICES [address] [place], [place], [address] Phone: [phone]   Fax:  [phone]  Name: Maegan Buller MRN: [id number] Date of Birth: May 12, 1986

## 2020-06-13 ENCOUNTER — Other Ambulatory Visit: Payer: Self-pay

## 2020-06-13 ENCOUNTER — Ambulatory Visit: Payer: No Typology Code available for payment source

## 2020-06-13 DIAGNOSIS — M533 Sacrococcygeal disorders, not elsewhere classified: Secondary | ICD-10-CM

## 2020-06-13 DIAGNOSIS — M62838 Other muscle spasm: Secondary | ICD-10-CM

## 2020-06-13 DIAGNOSIS — M21372 Foot drop, left foot: Secondary | ICD-10-CM

## 2020-06-13 NOTE — Therapy (Signed)
[place] MAIN [facility] SERVICES [address] [place], [place], [address] Phone: [phone]   Fax:  [phone]  Physical Therapy Treatment  The patient has been informed of current processes in place at Outpatient Rehab to protect patients from Covid-19 exposure including social distancing, schedule modifications, and new cleaning procedures. After discussing their particular risk with a therapist based on the patient's personal risk factors, the patient has decided to proceed with in-person therapy.   Patient Details  Name: Lisa Brady MRN: [id number] Date of Birth: 01/10/1986 Referring Provider (PT): [name]   Encounter Date: 06/13/2020   PT End of Session - 06/13/20 0903    Visit Number 43    Number of Visits 43    Date for PT Re-Evaluation 06/06/20    Authorization Type [employer]    Authorization - Visit Number 10    Authorization - Number of Visits 10    Progress Note Due on Visit 10    PT Start Time 0800    PT Stop Time 0900    PT Time Calculation (min) 60 min    Activity Tolerance Patient tolerated treatment well;No increased pain    Behavior During Therapy WFL for tasks assessed/performed           Past Medical History:  Diagnosis Date  . Foot drop     Past Surgical History:  Procedure Laterality Date  . WISDOM TOOTH EXTRACTION  2006    There were no vitals filed for this visit.   Pelvic Floor Physical Therapy Treatment Note  SCREENING  Changes in medications, allergies, or medical history?: none    SUBJECTIVE  Patient reports: "peed on herself for no good reason this week". Monday she had gone swimming in the pool, had been out for a little while and she went to change out of her suit and just started peeing. Had done her PT that day and had been trying to keep track of how often she was peeing over the day and doing urge suppression technique, etc. Has found that at least 2 times per day she is  getting the urge and goes and it is less than 5 seconds. Some of this is JICing due to anxiety with going somewhere or being busy. Only thing she can think of is that she was cuddling/holding the baby a lot more. Also started having LBP and suprapubic pain increased too from holding the baby more.  Precautions:  11 months PP  Pain update:  Location of pain:  L hip/side (shoulders/neck) Current pain:  3/10 (0/10) Max pain:  3/10  (2/10) Least pain:  0/10 (0/10) Nature of pain: tightness/achy  **achy form treatment but no "pain" following session.  Patient Goals: Control her bladder/not need depends.   OBJECTIVE  Changes in:   MMT LE  - L DF 4+/5 (different rater may account for difference but could be due to spasms in back since test performed prior to DN/manual) - R DF 5/5 (from prior session)  Observation:  R anterior/L posterior rotation at pelvis.  Protracted and elevated R scapula. (from prior session)  Strength:  Muscle fatigue reached earlier on R>L with tall-kneeling/balance exercises. Imbalance surrounding R scapula not allowing Pt. To perform I's, Y's, T's W's well. (from prior session)  Pt. Having a hard time recruiting deep-core and PFM pre-treatment with cog-wheel like resistance which was improved following treatment.  Pelvic floor: TTP through R posterior PR/PC and OI as well as 5 o'clock region of  posterior fourchette. TTP to to L posterior fourchette at ~ 5 o'clock. Coordinated and able to keep probe in even at ~ 45 degrees. Increased force production when TA and obliques recruited. ~ 3+/5 strength and maximal endurance at ~ 8 seconds.  (from prior session)   ROM/Mobility:  Decreased scapular depression and downward rotation ROM in R>L scapula. (from prior session)  Palpation:  TTP to L lumbar multifidus, QL, and B iliacus.  Gait Analysis: (from prior session) Gait speed: 1.71m/s ; anterior pelvic tilt, knee hyperextension, heel strike - with cues pelvic  neutral achieved, with appropriate arm swing and decreased heel strike.     INTERVENTIONS THIS SESSION: Manual: Performed TP release and STM L lumbar multifidus, QL, and B iliacus to improve balance of musculature surrounding the pelvis and improve overall posture for optimal musculature length-tension relationship and function.  Dry-needle: Performed TPDN with a .30x27mm needle and standard approach as described below to decrease spasm and pain and allow for improved balance of musculature for improved function and decreased symptoms.   Therex: Reviewed and practiced posterior pelvic tilt with kegel and used it as a test-re-test to determine if TPDN improved recruitment.  Theract: Educated on pulling her his back rather than leaning back to counteract the added weight of holding the baby in front/to her side and how to use the stairs as a functional exercise to engage the glutes and allow for better ability to strengthen the quads as well without over-loading the knees to prevent pain flare-ups and allow Pt. To use improved body mechanics when bending/lifting the baby etc.  Total time: 60 min.                     Trigger Point Dry Needling - 06/13/20 0001    Consent Given? Yes    Education Handout Provided No    Muscles Treated Back/Hip Iliacus;Lumbar multifidi;Quadratus lumborum    Dry Needling Comments B Iliacus, L QL and lumbar multifidus.    Iliacus Response Twitch response elicited;Palpable increased muscle length    Lumbar multifidi Response Twitch response elicited;Palpable increased muscle length    Quadratus Lumborum Response Twitch response elicited;Palpable increased muscle length                  PT Short Term Goals - 06/13/20 0904      PT SHORT TERM GOAL #1   Title Patient will demonstrate improved pelvic alignment and balance of musculature surrounding the pelvis to facilitate decreased PFM spasms and decrease pressure on nerve roots to allow for  optimal PFM function    Baseline Has L foot-drop and pain in L glutes/knee as well as inability to sense PFM and L posterior innominate rotation/spasms surrounding pelvis.    Time 5    Period Weeks    Status Achieved    Target Date 11/26/19      PT SHORT TERM GOAL #2   Title Patient will demonstrate HEP x1 in the clinic to demonstrate understanding and proper form to allow for further improvement.    Baseline Pt. lacks knowledge of therapeutic ecersise that will decrease Sx.    Time 5    Period Weeks    Status Achieved    Target Date 11/26/19      PT SHORT TERM GOAL #3   Title Pt. will be able to walk 250 Ft.with CGA and w/o assistive device to demonstrate improved balance/decreased instability and foot drop.    Baseline Pt. using RW to AMB due  to L foot-drop and increased instability.    Time 5    Period Weeks    Status Achieved    Target Date 08/20/19      PT SHORT TERM GOAL #4   Title Patient will demonstrate a coordinated contraction, relaxation, and bulge of the pelvic floor muscles to demonstrate functional recruitment and motion and allow for further strengthening.    Baseline Pt. reports inability to sense the PFM for kegel or relaxation, having mixed UI    Time 5    Period Weeks    Status Achieved    Target Date 08/20/19             PT Long Term Goals - 06/13/20 0904      PT LONG TERM GOAL #1   Title Patient will report no episodes of UUI/SUI over the course of the prior two weeks to demonstrate improved functional ability.    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 As of 4/30: She had been doing better but then took a step backward when she was sick and vomiting a lot. As of 7/9: Pt. continues to have decreased incontinence but had an episode of total bladder loss and UUI ~ 1-2 times/day still (worsened due to overuse/spasm of LB musculature.    Time 10    Period Weeks     Status On-going    Target Date 08/22/20      PT LONG TERM GOAL #2   Title Patient will report no pain with intercourse to demonstrate improved functional ability.    Baseline having mild pain with initial penetration As of 1:29: had some pain at initial penetration and with deeper thrusting at ~ 3-4/10 but "not terrible"    Time 10    Period Weeks    Status On-going    Target Date 08/22/20      PT LONG TERM GOAL #3   Title Patient will score at or below 22/100 on the UDI and 27/100 on the UIQ to demonstrate a clinically meaningful decrease in disability and distress due to pelvic floor dysfunction.    Baseline UDI: 67, UIQ: 72/100 10-19-19: UDI 55.5, UIQ 76/100; POPDI 11/100 As of 1/29: UDI:33/100, UIQ: 6/100, POPDI:6/100. As of 4/30: Due to set-back from    Time 10    Period Weeks    Status On-going    Target Date 08/22/20      PT LONG TERM GOAL #4   Title Pt. 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    Baseline Pt. requiring RW for AMB due to L foot-drop and imbalance. As of 10/12: Pt. requiring some support (e.g. grocery cart) for > 20 min. of AMB or stairs.    Time 10    Period Weeks    Status Achieved    Target Date 11/25/20                 Plan - 06/13/20 0903    Clinical Impression Statement Pt. is continuing to progress toward her goals and has longer periods of no or low Sx. but has not built sufficient strength to maintain against added stresses occasionally as she adds higher level activities and the baby gets bigger. She continues to respond well to interventions well and was able to demonstrate improved  ability to recruit PFM and deep-core following TPDN today to help her get back to more effective kegels and other HEP exercises. She has difficulty recruiting the scapular stabilizers in her R>L shoulder and weakness around her lateral and posterior hips  and this makes her rely on poor body mechanics to bend and lift her ~ 36 year old so we are working on improving balance of musculature surrounding these joints to imporve her posture, deep-core stability, and allow for appropriate use of her PFM to help prevent return/increased POP, pain, and mixed UI Sx. We will continue for 10 more weeks at 1x/week to allow for further strengthening while keeling spasms in check and improving body mechanics with ADL's.    Personal Factors and Comorbidities Comorbidity 1;Comorbidity 2    Comorbidities scoliosis, hypermobility    Examination-Activity Limitations Locomotion Level;Stand;Stairs;Lift;Squat;Toileting;Continence;Sit;Transfers;Caring for Others;Bend    Examination-Participation Restrictions Interpersonal Relationship;Yard Work;Community Activity;Meal Prep;Laundry    Stability/Clinical Decision Making Evolving/Moderate complexity    Clinical Decision Making Moderate    Rehab Potential Good    PT Frequency 1x / week    PT Duration Other (comment)    PT Treatment/Interventions ADLs/Self Care Home Management;Aquatic Therapy;Biofeedback;Electrical Stimulation;Gait training;Stair training;Balance training;Therapeutic exercise;Therapeutic activities;Functional mobility training;Neuromuscular re-education;Patient/family education;Manual techniques;Scar mobilization;Dry needling;Taping;Spinal Manipulations;Joint Manipulations    PT Next Visit Plan Re-assess goals and send re-cert,TPDN to R posterior scapular stabilizers and perform PNF for the scapula and rainbows on the R.re-assess rib mobility and obliques/diaphragm, re-assess internally, review deep-core coordination (tall kneel, knee step through, stepping, lean-backs) stabilize gait pattern; Internal TP release to scar at posterior fourchette, treat spinal mobility to improve diaphragmcoordination and reduce L LE radiating symptoms; TP release vs. DN to, hip-flexors, and R pectineus. re-assess for Piriformis spasm  (have performed DN and TP release now); check in with long term HEP performance    PT Home Exercise Plan A day: TeapotSide-planksPlanksDorothy's, scapular rows and I's, Y's, T's, and W's (temp. rows only and stretch levator scap on R.  B UYQ:[id number] with leg lift Child's pose V-sit/ single leg hamstring stretch Side stretch Every day: Pilates bridges,Kegels 3-5x/day, vaginal weights, chest-stretch, tall kneeling, tall-kneeling step-through, lean-backs    Consulted and Agree with Plan of Care Patient           Patient will benefit from skilled therapeutic intervention in order to improve the following deficits and impairments:  Abnormal gait, Decreased balance, Decreased endurance, Difficulty walking, Increased muscle spasms, Impaired tone, Improper body mechanics, Decreased activity tolerance, Decreased coordination, Decreased strength, Hypermobility, Postural dysfunction, Pain  Visit Diagnosis: Other muscle spasm  Foot drop, left  Sacrococcygeal disorders, not elsewhere classified     Problem List Patient Active Problem List   Diagnosis Date Noted  . Urinary incontinence 08/17/2019  . Foot drop, left 08/17/2019  . Maternal varicella, non-immune 11/20/2018   Willa Rough DPT, ATC Willa Rough 06/13/2020, 9:28 AM  [place] [facility] SERVICES [address] [place], [place], [address] Phone: [phone]   Fax:  [phone]  Name: Lisa Brady MRN: [id number] Date of Birth: 27-Nov-1986

## 2020-06-13 NOTE — Patient Instructions (Signed)
"  ha-ha" exercise:  Start with slow and middle-pitch "ha-ha" where you tuck slightly under and do a kegel just before/as you make the noise. Then do multiple in a row and raise the pitch as it becomes easier. Try to do this for at least 1-2 min per day.

## 2020-06-20 ENCOUNTER — Ambulatory Visit: Payer: No Typology Code available for payment source

## 2020-06-20 ENCOUNTER — Other Ambulatory Visit: Payer: Self-pay

## 2020-06-20 DIAGNOSIS — M62838 Other muscle spasm: Secondary | ICD-10-CM

## 2020-06-20 DIAGNOSIS — M21372 Foot drop, left foot: Secondary | ICD-10-CM

## 2020-06-20 DIAGNOSIS — M533 Sacrococcygeal disorders, not elsewhere classified: Secondary | ICD-10-CM

## 2020-06-20 NOTE — Patient Instructions (Signed)
Hip Abduction: Side Leg Lift - Side-Lying    Lie on side. Draw lower tummy muscle (TA) and pelvic floor in, Lift top leg until you feel strong contraction of muscle on the side of the hip. Keep top leg straight with body, toes pointing forward. Do not let your hip roll back! Do _10__ reps per set, __3_ sets per day

## 2020-06-20 NOTE — Therapy (Signed)
Lisa Brady [facility] SERVICES [address] [place], [place], [address] Phone: [phone]   Fax:  [phone]  Physical Therapy Treatment  The patient has been informed of current processes in place at [facility]patient Rehab to protect patients from Covid-19 exposure including social distancing, schedule modifications, and new cleaning procedures. After discussing their particular risk with a therapist based on the patient's personal risk factors, the patient has decided to proceed with in-person therapy.  Patient Details  Name: Lisa Brady MRN: [id number] Date of Birth: [birth year] Referring Provider (PT): [name]   Encounter Date: 06/20/2020   PT End of Session - 06/20/20 0918    Visit Number 44    Number of Visits 53    Date for PT Re-Evaluation 08/22/20    Authorization Type [employer]    Authorization - Visit Number 1    Authorization - Number of Visits 10    Progress Note Due on Visit 10    PT Start Time 0800    PT Stop Time 0900    PT Time Calculation (min) 60 min    Activity Tolerance Patient tolerated treatment well;No increased pain    Behavior During Therapy WFL for tasks assessed/performed           Past Medical History:  Diagnosis Date   Foot drop     Past Surgical History:  Procedure Laterality Date   WISDOM TOOTH EXTRACTION  2006    There were no vitals filed for this visit.   Pelvic Floor Physical Therapy Treatment Note  SCREENING  Changes in medications, allergies, or medical history?: none    SUBJECTIVE  Patient reports: Lisa Brady is still sick with ear infection and not sleeping well so PT exercises have gone out the window. Had an accident in the pool again but no other accidents. Has been going more frequently over the last 24 Hrs. But has not really been trying to do urge suppression. Feels like she has more heaviness when she uses the "right posture" not sure.   Precautions:  11 months  PP  Pain update:  Location of pain:  L hip/side (shoulders/neck) Current pain:  0/10 (1/10) Max pain:  0/10  (2/10) Least pain:  0/10 (0/10) Nature of pain: tightness/achy  **no change/not addressed today  Patient Goals: Control her bladder/not need depends.   OBJECTIVE  Changes in:   MMT LE  - L DF 4+/5 (different rater may account for difference but could be due to spasms in back since test performed prior to DN/manual) - R DF 5/5 (from prior session)  Observation:  R anterior/L posterior rotation at pelvis.  Protracted and elevated R scapula. (from prior session)  Strength:  Muscle fatigue reached earlier on R>L with tall-kneeling/balance exercises. Imbalance surrounding R scapula not allowing Pt. To perform I's, Y's, T's W's well. (from prior session)  Pt. Having a hard time recruiting deep-core and PFM pre-treatment with cog-wheel like resistance which was improved following treatment.  Pelvic floor: TTP through R posterior PR/PC and OI as well as 5 o'clock region of posterior fourchette. TTP to to L posterior fourchette at ~ 5 o'clock. Coordinated and able to keep probe in even at ~ 45 degrees. Increased force production when TA and obliques recruited. ~ 3+/5 strength and maximal endurance at ~ 8 seconds.  (from prior session)  TODAY: TTP through L PR/PC and at B posterior fourchette. Had ~ 1+/5 strength with kegel pre-treatment and improved to ~ 3++/5 following treatment when  cued to not attempt posterior pelvic tilt due to glute compensation.  ROM/Mobility:  Decreased scapular depression and downward rotation ROM in R>L scapula. (from prior session)  Palpation:  TTP to B adductor brevis and Magnus  Gait Analysis: (from prior session) Gait speed: 1.31m/s ; anterior pelvic tilt, knee hyperextension, heel strike - with cues pelvic neutral achieved, with appropriate arm swing and decreased heel strike.     INTERVENTIONS THIS SESSION: Manual: Performed TP release  and STM to B adductor brevis and Magnus to improve balance of musculature surrounding the pelvis and improve overall posture for optimal musculature length-tension relationship and function.  Dry-needle: Performed TPDN with a .30x126mm needle and standard approach as described below to decrease spasm and pain and allow for improved balance of musculature for improved function and decreased symptoms.   Therex: Reviewed and practiced kegel and used it as a test-re-test to determine if TPDN and internal TP release improved recruitment. Educated on and practiced side-lying hip to improve strength of muscles opposing tight musculature to allow reciprocal inhibition to improve balance of musculature surrounding the pelvis and improve overall posture for optimal musculature length-tension relationship and function.   Total time: 60 min.                       Trigger Point Dry Needling - 06/20/20 0001    Consent Given? Yes    Education Handout Provided No    Muscles Treated Lower Quadrant Adductor longus/brevis/magnus    Adductor Response Twitch response elicited;Palpable increased muscle length                  PT Short Term Goals - 06/13/20 0904      PT SHORT TERM GOAL #1   Title Patient will demonstrate improved pelvic alignment and balance of musculature surrounding the pelvis to facilitate decreased PFM spasms and decrease pressure on nerve roots to allow for optimal PFM function    Baseline Has L foot-drop and pain in L glutes/knee as well as inability to sense PFM and L posterior innominate rotation/spasms surrounding pelvis.    Time 5    Period Weeks    Status Achieved    Target Date 11/26/19      PT SHORT TERM GOAL #2   Title Patient will demonstrate HEP x1 in the clinic to demonstrate understanding and proper form to allow for further improvement.    Baseline Pt. Brady knowledge of therapeutic ecersise that will decrease Sx.    Time 5    Period Weeks     Status Achieved    Target Date 11/26/19      PT SHORT TERM GOAL #3   Title Pt. will be able to walk 250 Ft.with CGA and w/o assistive device to demonstrate improved balance/decreased instability and foot drop.    Baseline Pt. using RW to AMB due to L foot-drop and increased instability.    Time 5    Period Weeks    Status Achieved    Target Date 08/20/19      PT SHORT TERM GOAL #4   Title Patient will demonstrate a coordinated contraction, relaxation, and bulge of the pelvic floor muscles to demonstrate functional recruitment and motion and allow for further strengthening.    Baseline Pt. reports inability to sense the PFM for kegel or relaxation, having mixed UI    Time 5    Period Weeks    Status Achieved    Target Date 08/20/19  PT Long Term Goals - 06/13/20 0904      PT LONG TERM GOAL #1   Title Patient will report no episodes of UUI/SUI over the course of the prior two weeks to demonstrate improved functional ability.    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 As of 4/30: She had been doing better but then took a step backward when she was sick and vomiting a lot. As of 7/9: Pt. continues to have decreased incontinence but had an episode of total bladder loss and UUI ~ 1-2 times/day still (worsened due to overuse/spasm of LB musculature.    Time 10    Period Weeks    Status On-going    Target Date 08/22/20      PT LONG TERM GOAL #2   Title Patient will report no pain with intercourse to demonstrate improved functional ability.    Baseline having mild pain with initial penetration As of 1:29: had some pain at initial penetration and with deeper thrusting at ~ 3-4/10 but "not terrible"    Time 10    Period Weeks    Status On-going    Target Date 08/22/20      PT LONG TERM GOAL #3   Title Patient will score at or below 22/100 on the UDI and 27/100 on the UIQ to  demonstrate a clinically meaningful decrease in disability and distress due to pelvic floor dysfunction.    Baseline UDI: 67, UIQ: 72/100 10-19-19: UDI 55.5, UIQ 76/100; POPDI 11/100 As of 1/29: UDI:33/100, UIQ: 6/100, POPDI:6/100. As of 4/30: Due to set-back from    Time 10    Period Weeks    Status On-going    Target Date 08/22/20      PT LONG TERM GOAL #4   Title Pt. 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    Baseline Pt. requiring RW for AMB due to L foot-drop and imbalance. As of 10/12: Pt. requiring some support (e.g. grocery cart) for > 20 min. of AMB or stairs.    Time 10    Period Weeks    Status Achieved    Target Date 11/25/20                 Plan - 06/20/20 2440    Clinical Impression Statement Pt. Responded well to all interventions today, demonstrating decreased spasm and TTP through B adductors, improved recruitment of the PFM, and understanding of how reciprocal inhibition will help decrease adductor spasm/compensation as well as understanding and correct performance of all education and exercises provided today. They will continue to benefit from skilled physical therapy to work toward remaining goals and maximize function as well as decrease likelihood of symptom increase or recurrence.    PT Next Visit Plan TPDN to R posterior scapular stabilizers and perform PNF for the scapula and rainbows on the R.re-assess rib mobility and obliques/diaphragm, re-assess internally, review deep-core coordination (tall kneel, knee step through, stepping, lean-backs) stabilize gait pattern; Internal TP release to scar at posterior fourchette, treat spinal mobility to improve diaphragmcoordination and reduce L LE radiating symptoms; TP release vs. DN to, hip-flexors, and R pectineus. re-assess for Piriformis spasm (have performed DN and TP release now); check in with long term  HEP performance    PT Home Exercise Plan  A day: TeapotSide-planksPlanksDorothy's, scapular rows and I's, Y's, T's, and W's (temp. rows only and stretch levator scap on R.  B [id number] with leg lift Child's pose V-sit/ single leg hamstring stretch, side-lying hip ABD. Side stretch Every day: Pilates bridges,Kegels 3-5x/day, vaginal weights, chest-stretch, tall kneeling, tall-kneeling step-through, lean-backs    Consulted and Agree with Plan of Care Patient           Patient will benefit from skilled therapeutic intervention in order to improve the following deficits and impairments:     Visit Diagnosis: Other muscle spasm  Foot drop, left  Sacrococcygeal disorders, not elsewhere classified     Problem List Patient Active Problem List   Diagnosis Date Noted   Urinary incontinence 08/17/2019   Foot drop, left 08/17/2019   Maternal varicella, non-immune 11/20/2018   Willa Rough DPT, ATC Willa Rough 06/20/2020, 9:20 AM  Marne Brady [facility] SERVICES [address] [place], [place], [address] Phone: [phone]   Fax:  [phone]  Name: Lisa Brady MRN: [id number] Date of Birth: [birth year]

## 2020-06-27 ENCOUNTER — Other Ambulatory Visit: Payer: Self-pay

## 2020-06-27 ENCOUNTER — Ambulatory Visit: Payer: No Typology Code available for payment source

## 2020-06-27 DIAGNOSIS — M533 Sacrococcygeal disorders, not elsewhere classified: Secondary | ICD-10-CM

## 2020-06-27 DIAGNOSIS — M21372 Foot drop, left foot: Secondary | ICD-10-CM

## 2020-06-27 DIAGNOSIS — M62838 Other muscle spasm: Secondary | ICD-10-CM | POA: Diagnosis not present

## 2020-06-27 NOTE — Therapy (Signed)
[place] [facility] SERVICES [address] [place], [place], [address] Phone: [phone]   Fax:  [phone]  Physical Therapy Treatment  The patient has been informed of current processes in place at Outpatient Rehab to protect patients from Covid-19 exposure including social distancing, schedule modifications, and new cleaning procedures. After discussing their particular risk with a therapist based on the patient's personal risk factors, the patient has decided to proceed with in-person therapy.  Patient Details  Name: Lisa Brady MRN: [id number] Date of Birth: 1986-09-02 Referring Provider (PT): [name]   Encounter Date: 06/27/2020   PT End of Session - 06/27/20 0914    Visit Number 45    Number of Visits 53    Date for PT Re-Evaluation 08/22/20    Authorization Type     Authorization - Visit Number 2    Authorization - Number of Visits 10    Progress Note Due on Visit 10    PT Start Time 0805    PT Stop Time 0905    PT Time Calculation (min) 60 min    Activity Tolerance Patient tolerated treatment well;No increased pain    Behavior During Therapy WFL for tasks assessed/performed           Past Medical History:  Diagnosis Date  . Foot drop     Past Surgical History:  Procedure Laterality Date  . WISDOM TOOTH EXTRACTION  2006    There were no vitals filed for this visit.   Pelvic Floor Physical Therapy Treatment Note  SCREENING  Changes in medications, allergies, or medical history?: none    SUBJECTIVE  Patient reports: She had leakage when she had a sneezing fit one day. Feels like it is still hit or miss whether she is getting the kegel's correctly. Sometimes still has a hard time feeling everything. Knows she missed her exercises a couple days. Has really tried to focus on her posture more. Feels like she may have some fatigue from trying to use better posture. Shoulder is about the  same.  Precautions:  11 months PP  Pain update:  Location of pain:  L hip/side (shoulders/neck) Current pain:  0/10 (0/10) Max pain:  2/10  (3/10) Least pain:  0/10 (0/10) Nature of pain: tightness/achy  **no change/not addressed today  Patient Goals: Control her bladder/not need depends.   OBJECTIVE  Changes in:   MMT LE  - L DF 4+/5 (different rater may account for difference but could be due to spasms in back since test performed prior to DN/manual) - R DF 5/5 (from prior session)  Observation:  R anterior/L posterior rotation at pelvis.  Protracted and elevated R scapula. (from prior session)  Strength:  Muscle fatigue reached earlier on R>L with tall-kneeling/balance exercises. Imbalance surrounding R scapula not allowing Pt. To perform I's, Y's, T's W's well. (from prior session)  Pelvic floor: TTP through R posterior PR/PC and OI as well as 5 o'clock region of posterior fourchette. TTP to to L posterior fourchette at ~ 5 o'clock. Coordinated and able to keep probe in even at ~ 45 degrees. Increased force production when TA and obliques recruited. ~ 3+/5 strength and maximal endurance at ~ 8 seconds.  (from prior session)  TODAY: Pt. Able to maintain PFM squeeze to prevent probe from being ejected with laughing and small sneezes but any amount of cough pressure presses the probe out. Her maximal squeeze endurance is ~ 7 sec. Which she was able to  repeat at least 3 times in a row. She is able to layer TA on top of kegel without losing kegel strength and was able to encorporate posterior pelvic tilt with TA and PFM squeeze to allow a faster more functional "knack" to help minimize POP  Pressure and decrease leakage. Pt. Was able with MOD cueing to maintain probe placement in standing and demonstrate ~ 75% PFM squeeze with ~ 3 sec. Endurance in standing with posterior pelvic tilt. She initially had reversed her pelvic tilt upon standing and was arching low back but able to  correct with VC, TC.   ROM/Mobility:  Decreased scapular depression and downward rotation ROM in R>L scapula. (from prior session)  Palpation:   Gait Analysis: (from prior session) Gait speed: 1.72m/s ; anterior pelvic tilt, knee hyperextension, heel strike - with cues pelvic neutral achieved, with appropriate arm swing and decreased heel strike.     INTERVENTIONS THIS SESSION: NM re-ed: Used MOD TC and VC along with biofeedback as visual cue to improve timing and recruitment of the PFM with the TA while doing a posterior pelvic tilt to help improve Pt's ability to perform "knack" quickly enough and with enough force to help prevent downward pressure making her have SUI.  Therex: Reviewed and practiced kegels and posterior pelvic tilts and discussed how to make the most out of them by increasing focus on endurance and timing kegel with tilt before squeeze to decrease SUI.  Biofeedback: Used biofeedback to help improve Pt's awareness and proprioception of what is happening with her PFM when she is attempting to do posterior pelvic tilt or TA contraction to improve coordination issue noted at prior session when pt. Lost her Kegel when trying to engage the TA or do a posterior pelvic tilt. Worked on improving control with small and large squeezes in hook-lying, sitting, and standing.   Total time: 60 min.                              PT Short Term Goals - 06/13/20 0904      PT SHORT TERM GOAL #1   Title Patient will demonstrate improved pelvic alignment and balance of musculature surrounding the pelvis to facilitate decreased PFM spasms and decrease pressure on nerve roots to allow for optimal PFM function    Baseline Has L foot-drop and pain in L glutes/knee as well as inability to sense PFM and L posterior innominate rotation/spasms surrounding pelvis.    Time 5    Period Weeks    Status Achieved    Target Date 11/26/19      PT SHORT TERM GOAL #2   Title  Patient will demonstrate HEP x1 in the clinic to demonstrate understanding and proper form to allow for further improvement.    Baseline Pt. lacks knowledge of therapeutic ecersise that will decrease Sx.    Time 5    Period Weeks    Status Achieved    Target Date 11/26/19      PT SHORT TERM GOAL #3   Title Pt. will be able to walk 250 Ft.with CGA and w/o assistive device to demonstrate improved balance/decreased instability and foot drop.    Baseline Pt. using RW to AMB due to L foot-drop and increased instability.    Time 5    Period Weeks    Status Achieved    Target Date 08/20/19      PT SHORT TERM GOAL #4  Title Patient will demonstrate a coordinated contraction, relaxation, and bulge of the pelvic floor muscles to demonstrate functional recruitment and motion and allow for further strengthening.    Baseline Pt. reports inability to sense the PFM for kegel or relaxation, having mixed UI    Time 5    Period Weeks    Status Achieved    Target Date 08/20/19             PT Long Term Goals - 06/13/20 0904      PT LONG TERM GOAL #1   Title Patient will report no episodes of UUI/SUI over the course of the prior two weeks to demonstrate improved functional ability.    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 As of 4/30: She had been doing better but then took a step backward when she was sick and vomiting a lot. As of 7/9: Pt. continues to have decreased incontinence but had an episode of total bladder loss and UUI ~ 1-2 times/day still (worsened due to overuse/spasm of LB musculature.    Time 10    Period Weeks    Status On-going    Target Date 08/22/20      PT LONG TERM GOAL #2   Title Patient will report no pain with intercourse to demonstrate improved functional ability.    Baseline having mild pain with initial penetration As of 1:29: had some pain at initial penetration and with  deeper thrusting at ~ 3-4/10 but "not terrible"    Time 10    Period Weeks    Status On-going    Target Date 08/22/20      PT LONG TERM GOAL #3   Title Patient will score at or below 22/100 on the UDI and 27/100 on the UIQ to demonstrate a clinically meaningful decrease in disability and distress due to pelvic floor dysfunction.    Baseline UDI: 67, UIQ: 72/100 10-19-19: UDI 55.5, UIQ 76/100; POPDI 11/100 As of 1/29: UDI:33/100, UIQ: 6/100, POPDI:6/100. As of 4/30: Due to set-back from    Time 10    Period Weeks    Status On-going    Target Date 08/22/20      PT LONG TERM GOAL #4   Title Pt. 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    Baseline Pt. requiring RW for AMB due to L foot-drop and imbalance. As of 10/12: Pt. requiring some support (e.g. grocery cart) for > 20 min. of AMB or stairs.    Time 10    Period Weeks    Status Achieved    Target Date 11/25/20                 Plan - 06/27/20 0914    Clinical Impression Statement Pt. Responded well to all interventions today, demonstrating improved PFM and TA coordination and endurance and ability to stand without losing probe, as well as understanding and correct performance of all education and exercises provided today. They will continue to benefit from skilled physical therapy to work toward remaining goals and maximize function as well as decrease likelihood of symptom increase or recurrence.    PT Next Visit Plan TPDN to R posterior scapular stabilizers and perform PNF for the scapula and rainbows on the R.re-assess rib mobility and obliques/diaphragm, re-assess internally,  review deep-core coordination (tall kneel, knee step through, stepping, lean-backs) stabilize gait pattern; Internal TP release to scar at posterior fourchette, treat spinal mobility to improve diaphragmcoordination and reduce L LE radiating  symptoms; TP release vs. DN to, hip-flexors, and R pectineus. re-assess for Piriformis spasm (have performed DN and TP release now); check in with long term HEP performance    PT Home Exercise Plan A day: TeapotSide-planksPlanksDorothy's, scapular rows and I's, Y's, T's, and W's (temp. rows only and stretch levator scap on R.  B [id number] with leg lift Child's pose V-sit/ single leg hamstring stretch, side-lying hip ABD. Side stretch Every day: Pilates bridges,Kegels 3-5x/day with long-hold and pelvic tilt with sneeze, vaginal weights, chest-stretch, tall kneeling, tall-kneeling step-through, lean-backs    Consulted and Agree with Plan of Care Patient           Patient will benefit from skilled therapeutic intervention in order to improve the following deficits and impairments:     Visit Diagnosis: Other muscle spasm  Foot drop, left  Sacrococcygeal disorders, not elsewhere classified     Problem List Patient Active Problem List   Diagnosis Date Noted  . Urinary incontinence 08/17/2019  . Foot drop, left 08/17/2019  . Maternal varicella, non-immune 11/20/2018   Willa Rough DPT, ATC Willa Rough 06/27/2020, 9:44 AM  [place] [facility] SERVICES [address] [place], [place], [address] Phone: [phone]   Fax:  [phone]  Name: Lisa Brady MRN: [id number] Date of Birth: 1986-05-03

## 2020-07-04 ENCOUNTER — Ambulatory Visit: Payer: No Typology Code available for payment source

## 2020-07-04 ENCOUNTER — Other Ambulatory Visit: Payer: Self-pay

## 2020-07-04 DIAGNOSIS — M21372 Foot drop, left foot: Secondary | ICD-10-CM

## 2020-07-04 DIAGNOSIS — M533 Sacrococcygeal disorders, not elsewhere classified: Secondary | ICD-10-CM

## 2020-07-04 DIAGNOSIS — M62838 Other muscle spasm: Secondary | ICD-10-CM | POA: Diagnosis not present

## 2020-07-04 NOTE — Patient Instructions (Signed)
Small rotations near the point of discomfort.  Let the top arm rest on your side, and slide along the torso as you rotate. Breathe in as you come forward, out as you open up. Do 2x15 on each side.

## 2020-07-04 NOTE — Therapy (Signed)
[place] MAIN [facility] SERVICES [address] [place], [place], [address] Phone: [phone]   Fax:  [phone]  Physical Therapy Treatment  The patient has been informed of current processes in place at Outpatient Rehab to protect patients from Covid-19 exposure including social distancing, schedule modifications, and new cleaning procedures. After discussing their particular risk with a therapist based on the patient's personal risk factors, the patient has decided to proceed with in-person therapy.  Patient Details  Name: Lisa Brady MRN: [id number] Date of Birth: 1986-09-07 Referring Provider (PT): [name]   Encounter Date: 07/04/2020   PT End of Session - 07/04/20 0936    Visit Number 17    Number of Visits 53    Date for PT Re-Evaluation 08/22/20    Authorization Type [place]    Authorization - Visit Number 3    Authorization - Number of Visits 10    Progress Note Due on Visit 10    PT Start Time 0800    PT Stop Time 0900    PT Time Calculation (min) 60 min    Activity Tolerance Patient tolerated treatment well;No increased pain    Behavior During Therapy WFL for tasks assessed/performed           Past Medical History:  Diagnosis Date  . Foot drop     Past Surgical History:  Procedure Laterality Date  . WISDOM TOOTH EXTRACTION  2006    There were no vitals filed for this visit.  Pelvic Floor Physical Therapy Treatment Note  SCREENING  Changes in medications, allergies, or medical history?: none    SUBJECTIVE  Patient reports: Has laryngitis because she got it from her son who picked it up at daycare.   Precautions:  11 months PP  Pain update:  Location of pain:  L hip/side (shoulders/neck) Current pain:  4/10 (0/10) Max pain:  4/10  (3/10) Least pain:  0/10 (0/10) Nature of pain: tightness/achy  **"just sore" from rotations following.  Patient Goals: Control her bladder/not need  depends.   OBJECTIVE  Changes in:   MMT LE  - L DF 4+/5 (different rater may account for difference but could be due to spasms in back since test performed prior to DN/manual) - R DF 5/5 (from prior session)  Observation:  R anterior/L posterior rotation at pelvis.  Protracted and elevated R scapula. (from prior session)  Strength:  Muscle fatigue reached earlier on R>L with tall-kneeling/balance exercises. Imbalance surrounding R scapula not allowing Pt. To perform I's, Y's, T's W's well. (from prior session)  Pelvic floor: TTP through R posterior PR/PC and OI as well as 5 o'clock region of posterior fourchette. TTP to to L posterior fourchette at ~ 5 o'clock. Coordinated and able to keep probe in even at ~ 45 degrees. Increased force production when TA and obliques recruited. ~ 3+/5 strength and maximal endurance at ~ 8 seconds.  (from prior session)  TODAY: TTP to L PR/PC/IC and posterior fourchette, strength was 2/5 pre-treatment. ~ 3/5 following release and recruitment training.   ROM/Mobility:  Decreased scapular depression and downward rotation ROM in R>L scapula. (from prior session)  Decreased thoracic rotation to R>L with mild discomfort at T-L junction at end-range.  Palpation:  TTP to L QL and multifidus across T-L junction.  Gait Analysis: (from prior session) Gait speed: 1.23m/s ; anterior pelvic tilt, knee hyperextension, heel strike - with cues pelvic neutral achieved, with appropriate arm swing and decreased heel strike.  INTERVENTIONS THIS SESSION: Manual: Performed TP release internally to  L PR/PC/IC and posterior fourchette to decrease spasm and pain and allow for improved balance of musculature for improved function and decreased symptoms.  Therex: Reviewed and practiced kegels 2x5 quick-flicks and 1x5 for long-holds to improve strength and recruitment to decrease SUI and bow-and arrow 1x10 on each side to improve mobility of joint and surrounding  connective tissue and decrease pressure on nerve roots for improved conductivity and function of down-stream tissues.  .  Dry-needle: Performed TPDN with a .30x27mm needle and standard approach as described below to decrease spasm and pain and allow for improved balance of musculature for improved function and decreased symptoms.  Total time: 60 min.                      Trigger Point Dry Needling - 07/04/20 0001    Consent Given? Yes    Education Handout Provided No    Muscles Treated Back/Hip Quadratus lumborum    Quadratus Lumborum Response Twitch response elicited;Palpable increased muscle length                  PT Short Term Goals - 06/13/20 0904      PT SHORT TERM GOAL #1   Title Patient will demonstrate improved pelvic alignment and balance of musculature surrounding the pelvis to facilitate decreased PFM spasms and decrease pressure on nerve roots to allow for optimal PFM function    Baseline Has L foot-drop and pain in L glutes/knee as well as inability to sense PFM and L posterior innominate rotation/spasms surrounding pelvis.    Time 5    Period Weeks    Status Achieved    Target Date 11/26/19      PT SHORT TERM GOAL #2   Title Patient will demonstrate HEP x1 in the clinic to demonstrate understanding and proper form to allow for further improvement.    Baseline Pt. lacks knowledge of therapeutic ecersise that will decrease Sx.    Time 5    Period Weeks    Status Achieved    Target Date 11/26/19      PT SHORT TERM GOAL #3   Title Pt. will be able to walk 250 Ft.with CGA and w/o assistive device to demonstrate improved balance/decreased instability and foot drop.    Baseline Pt. using RW to AMB due to L foot-drop and increased instability.    Time 5    Period Weeks    Status Achieved    Target Date 08/20/19      PT SHORT TERM GOAL #4   Title Patient will demonstrate a coordinated contraction, relaxation, and bulge of the pelvic floor  muscles to demonstrate functional recruitment and motion and allow for further strengthening.    Baseline Pt. reports inability to sense the PFM for kegel or relaxation, having mixed UI    Time 5    Period Weeks    Status Achieved    Target Date 08/20/19             PT Long Term Goals - 06/13/20 0904      PT LONG TERM GOAL #1   Title Patient will report no episodes of UUI/SUI over the course of the prior two weeks to demonstrate improved functional ability.    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 As of 4/30: She had been  doing better but then took a step backward when she was sick and vomiting a lot. As of 7/9: Pt. continues to have decreased incontinence but had an episode of total bladder loss and UUI ~ 1-2 times/day still (worsened due to overuse/spasm of LB musculature.    Time 10    Period Weeks    Status On-going    Target Date 08/22/20      PT LONG TERM GOAL #2   Title Patient will report no pain with intercourse to demonstrate improved functional ability.    Baseline having mild pain with initial penetration As of 1:29: had some pain at initial penetration and with deeper thrusting at ~ 3-4/10 but "not terrible"    Time 10    Period Weeks    Status On-going    Target Date 08/22/20      PT LONG TERM GOAL #3   Title Patient will score at or below 22/100 on the UDI and 27/100 on the UIQ to demonstrate a clinically meaningful decrease in disability and distress due to pelvic floor dysfunction.    Baseline UDI: 67, UIQ: 72/100 10-19-19: UDI 55.5, UIQ 76/100; POPDI 11/100 As of 1/29: UDI:33/100, UIQ: 6/100, POPDI:6/100. As of 4/30: Due to set-back from    Time 10    Period Weeks    Status On-going    Target Date 08/22/20      PT LONG TERM GOAL #4   Title Pt. will demonstrate appropriate gait mechanics and be able to perform ADL's without use of AD and without pain to allow for  child-care and return to work.    Baseline Pt. requiring RW for AMB due to L foot-drop and imbalance. As of 10/12: Pt. requiring some support (e.g. grocery cart) for > 20 min. of AMB or stairs.    Time 10    Period Weeks    Status Achieved    Target Date 11/25/20                 Plan - 07/04/20 5361    Clinical Impression Statement Pt. Responded well to all interventions today, demonstrating decreased spasm and pain and improved PFM recruitment and proprioception of kegels, as well as understanding and correct performance of all education and exercises provided today. They will continue to benefit from skilled physical therapy to work toward remaining goals and maximize function as well as decrease likelihood of symptom increase or recurrence.     PT Next Visit Plan TPDN to R posterior scapular stabilizers and perform PNF for the scapula and rainbows on the R.re-assess rib mobility and obliques/diaphragm, re-assess internally, review deep-core coordination (tall kneel, knee step through, stepping, lean-backs) stabilize gait pattern; Internal TP release to scar at posterior fourchette, treat spinal mobility to improve diaphragmcoordination and reduce L LE radiating symptoms; TP release vs. DN to, hip-flexors, and R pectineus. re-assess for Piriformis spasm (have performed DN and TP release now); check in with long term HEP performance    PT Home Exercise Plan A day: TeapotSide-planksPlanksDorothy's, scapular rows and I's, Y's, T's, and W's (temp. rows only and stretch levator scap on R.  B WER:[id number] with leg lift Child's pose V-sit/ single leg hamstring stretch, side-lying hip ABD. Side stretch Every day: Pilates bridges,Kegels 3-5x/day with long-hold and pelvic tilt with sneeze, vaginal weights, chest-stretch, tall kneeling, tall-kneeling step-through, lean-backs    Consulted and Agree with Plan of Care Patient           Patient will benefit from skilled therapeutic  intervention in  order to improve the following deficits and impairments:     Visit Diagnosis: Other muscle spasm  Foot drop, left  Sacrococcygeal disorders, not elsewhere classified     Problem List Patient Active Problem List   Diagnosis Date Noted  . Urinary incontinence 08/17/2019  . Foot drop, left 08/17/2019  . Maternal varicella, non-immune 11/20/2018   Willa Rough DPT, ATC Willa Rough 07/04/2020, 9:38 AM  [place] MAIN [facility] SERVICES [address] [place], [place], [address] Phone: [phone]   Fax:  [phone]  Name: Gloriajean Okun MRN: [id number] Date of Birth: 1986/06/20

## 2020-07-18 ENCOUNTER — Other Ambulatory Visit: Payer: Self-pay

## 2020-07-18 ENCOUNTER — Ambulatory Visit: Payer: No Typology Code available for payment source | Attending: Obstetrics and Gynecology

## 2020-07-18 DIAGNOSIS — M62838 Other muscle spasm: Secondary | ICD-10-CM | POA: Insufficient documentation

## 2020-07-18 DIAGNOSIS — M533 Sacrococcygeal disorders, not elsewhere classified: Secondary | ICD-10-CM | POA: Diagnosis present

## 2020-07-18 DIAGNOSIS — M21372 Foot drop, left foot: Secondary | ICD-10-CM | POA: Insufficient documentation

## 2020-07-18 NOTE — Therapy (Signed)
[place] MAIN [facility] SERVICES [address] [place], [place], [address] Phone: [phone]   Fax:  [phone]  Physical Therapy Treatment  The patient has been informed of current processes in place at Outpatient Rehab to protect patients from Covid-19 exposure including social distancing, schedule modifications, and new cleaning procedures. After discussing their particular risk with a therapist based on the patient's personal risk factors, the patient has decided to proceed with in-person therapy.  Patient Details  Name: Lisa Brady MRN: [id number] Date of Birth: 20-Jan-1986 No data recorded  Encounter Date: 07/18/2020   PT End of Session - 07/18/20 1126    Visit Number 29    Number of Visits 46    Date for PT Re-Evaluation 08/22/20    Authorization Type [place]    Authorization - Visit Number 4    Authorization - Number of Visits 10    Progress Note Due on Visit 10    PT Start Time 0730    PT Stop Time 0830    PT Time Calculation (min) 60 min    Activity Tolerance Patient tolerated treatment well;No increased pain    Behavior During Therapy WFL for tasks assessed/performed           Past Medical History:  Diagnosis Date  . Foot drop     Past Surgical History:  Procedure Laterality Date  . WISDOM TOOTH EXTRACTION  2006    There were no vitals filed for this visit.   Pelvic Floor Physical Therapy Treatment Note  SCREENING  Changes in medications, allergies, or medical history?: none    SUBJECTIVE  Patient reports: Had a rough time while on vacation due to being sick still and cough/leaking a lot. Since the coughing has calmed down she has been doing better. Her back and neck are also flared up since she has not been doing her exercises as frequently while travelling. Has been trying to do some bow-and-arrows due to increased back pain. Decreased control and some discomfort in the abdominal  muscles.  Precautions:  11 months PP  Pain update:  Location of pain:  Low back hip/side (shoulders/neck) Current pain:  1/10 (3/10 with turning) Max pain:  4/10  (4/10) Least pain:  0/10 (0/10) Nature of pain: tightness/achy  ** no pain following session.  Patient Goals: Control her bladder/not need depends.   OBJECTIVE  Changes in:   MMT LE  - L DF 4+/5 (different rater may account for difference but could be due to spasms in back since test performed prior to DN/manual) - R DF 5/5 (from prior session)  Observation:  R anterior/L posterior rotation at pelvis.  Protracted and elevated R scapula. (from prior session)  Strength:  Muscle fatigue reached earlier on R>L with tall-kneeling/balance exercises. Imbalance surrounding R scapula not allowing Pt. To perform I's, Y's, T's W's well. (from prior session)  Pelvic floor: TTP through R posterior PR/PC and OI as well as 5 o'clock region of posterior fourchette. TTP to to L posterior fourchette at ~ 5 o'clock. Coordinated and able to keep probe in even at ~ 45 degrees. Increased force production when TA and obliques recruited. ~ 3+/5 strength and maximal endurance at ~ 8 seconds.  (from prior session)  TODAY: TTP to L PR/PC/IC and posterior fourchette, strength was 2/5 pre-treatment. ~ 3/5 following release and recruitment training.   ROM/Mobility:  Decreased scapular depression and downward rotation ROM in R>L scapula. (from prior session)  Decreased L cervical rotation  with painful end-feel.    Palpation:  TTP to L sub-occipitals cervical extensors, R iliacus and rectus abdominus.    Gait Analysis: (from prior session) Gait speed: 1.29ms ; anterior pelvic tilt, knee hyperextension, heel strike - with cues pelvic neutral achieved, with appropriate arm swing and decreased heel strike.     INTERVENTIONS THIS SESSION: Manual: Performed TP release to L sub-occipitals cervical extensors followed by grade 2-3 L to R  lateral mobs through ~ C2-4 and TP release to the R iliacus and rectus abdominus followed by MET correction x2 to decrease spasm and pain and allow for improved balance of musculature for improved function and decreased symptoms.  Therex: Reviewed and discussed re-starting self MET correction for R anterior rotation for a week to continue to improve pelvic alignment and allow for maximal efficiency of the PFM. Reviewed and instructed to perform L cervical rotational mobilizations to further improve neck ROM and prevent return of pain.  Total time: 60 min.                               PT Short Term Goals - 06/13/20 0904      PT SHORT TERM GOAL #1   Title Patient will demonstrate improved pelvic alignment and balance of musculature surrounding the pelvis to facilitate decreased PFM spasms and decrease pressure on nerve roots to allow for optimal PFM function    Baseline Has L foot-drop and pain in L glutes/knee as well as inability to sense PFM and L posterior innominate rotation/spasms surrounding pelvis.    Time 5    Period Weeks    Status Achieved    Target Date 11/26/19      PT SHORT TERM GOAL #2   Title Patient will demonstrate HEP x1 in the clinic to demonstrate understanding and proper form to allow for further improvement.    Baseline Pt. lacks knowledge of therapeutic ecersise that will decrease Sx.    Time 5    Period Weeks    Status Achieved    Target Date 11/26/19      PT SHORT TERM GOAL #3   Title Pt. will be able to walk 250 Ft.with CGA and w/o assistive device to demonstrate improved balance/decreased instability and foot drop.    Baseline Pt. using RW to AMB due to L foot-drop and increased instability.    Time 5    Period Weeks    Status Achieved    Target Date 08/20/19      PT SHORT TERM GOAL #4   Title Patient will demonstrate a coordinated contraction, relaxation, and bulge of the pelvic floor muscles to demonstrate functional  recruitment and motion and allow for further strengthening.    Baseline Pt. reports inability to sense the PFM for kegel or relaxation, having mixed UI    Time 5    Period Weeks    Status Achieved    Target Date 08/20/19             PT Long Term Goals - 06/13/20 0904      PT LONG TERM GOAL #1   Title Patient will report no episodes of UUI/SUI over the course of the prior two weeks to demonstrate improved functional ability.    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 As of 4/30: She had been doing better but  then took a step backward when she was sick and vomiting a lot. As of 7/9: Pt. continues to have decreased incontinence but had an episode of total bladder loss and UUI ~ 1-2 times/day still (worsened due to overuse/spasm of LB musculature.    Time 10    Period Weeks    Status On-going    Target Date 08/22/20      PT LONG TERM GOAL #2   Title Patient will report no pain with intercourse to demonstrate improved functional ability.    Baseline having mild pain with initial penetration As of 1:29: had some pain at initial penetration and with deeper thrusting at ~ 3-4/10 but "not terrible"    Time 10    Period Weeks    Status On-going    Target Date 08/22/20      PT LONG TERM GOAL #3   Title Patient will score at or below 22/100 on the UDI and 27/100 on the UIQ to demonstrate a clinically meaningful decrease in disability and distress due to pelvic floor dysfunction.    Baseline UDI: 67, UIQ: 72/100 10-19-19: UDI 55.5, UIQ 76/100; POPDI 11/100 As of 1/29: UDI:33/100, UIQ: 6/100, POPDI:6/100. As of 4/30: Due to set-back from    Time 10    Period Weeks    Status On-going    Target Date 08/22/20      PT LONG TERM GOAL #4   Title Pt. 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     Baseline Pt. requiring RW for AMB due to L foot-drop and imbalance. As of 10/12: Pt. requiring some support (e.g. grocery cart) for > 20 min. of AMB or stairs.    Time 10    Period Weeks    Status Achieved    Target Date 11/25/20                 Plan - 07/18/20 1127    Clinical Impression Statement Pt. Responded well to all interventions today, demonstrating improved neck and back pain, improved neck mobility and pelvic alignment by ~ 75% as well as understanding and correct performance of all education and exercises provided today. They will continue to benefit from skilled physical therapy to work toward remaining goals and maximize function as well as decrease likelihood of symptom increase or recurrence.    PT Next Visit Plan TPDN to R posterior scapular stabilizers and perform PNF for the scapula and rainbows on the R.re-assess rib mobility and obliques/diaphragm, re-assess internally, review deep-core coordination (tall kneel, knee step through, stepping, lean-backs) stabilize gait pattern; Internal TP release to scar at posterior fourchette, treat spinal mobility to improve diaphragmcoordination and reduce L LE radiating symptoms; TP release vs. DN to, hip-flexors, and R pectineus. re-assess for Piriformis spasm (have performed DN and TP release now); check in with long term HEP performance    PT Home Exercise Plan A day: TeapotSide-planksPlanksDorothy's, scapular rows and I's, Y's, T's, and W's (temp. rows only and stretch levator scap on R.  B FOY:[id number] with leg lift Child's pose V-sit/ single leg hamstring stretch, side-lying hip ABD. Side stretch Every day: Pilates bridges,Kegels 3-5x/day with long-hold and pelvic tilt with sneeze, vaginal weights, chest-stretch, tall kneeling, tall-kneeling step-through, lean-backs, added MET for R anterior for 1 week.    Consulted and Agree with Plan of Care Patient  Patient will benefit from skilled therapeutic intervention  in order to improve the following deficits and impairments:     Visit Diagnosis: Other muscle spasm  Foot drop, left  Sacrococcygeal disorders, not elsewhere classified     Problem List Patient Active Problem List   Diagnosis Date Noted  . Urinary incontinence 08/17/2019  . Foot drop, left 08/17/2019  . Maternal varicella, non-immune 11/20/2018   Willa Rough DPT, ATC Willa Rough 07/18/2020, 11:40 AM  [place] MAIN [facility] SERVICES [address] [place], [place], [address] Phone: [phone]   Fax:  [phone]  Name: Lisa Brady MRN: [id number] Date of Birth: Jul 06, 1986

## 2020-07-18 NOTE — Patient Instructions (Signed)
MET to Correct Right Anteriorly Rotated/Left Posteriorly Rotated Innominate   Begin laying on your back with your feet at 90 degrees. Put a dowel/broomstick  through your legs, behind your right knee and in front of your left knee. Stabilize the dowel on ether side with your hands.  Press down with the right leg and up with the left leg. Hold for 5 seconds  then slowly relax. Repeat 5 times.

## 2020-08-01 ENCOUNTER — Ambulatory Visit: Payer: No Typology Code available for payment source

## 2020-08-01 ENCOUNTER — Other Ambulatory Visit: Payer: Self-pay

## 2020-08-01 DIAGNOSIS — M62838 Other muscle spasm: Secondary | ICD-10-CM

## 2020-08-01 DIAGNOSIS — M533 Sacrococcygeal disorders, not elsewhere classified: Secondary | ICD-10-CM

## 2020-08-01 DIAGNOSIS — M21372 Foot drop, left foot: Secondary | ICD-10-CM

## 2020-08-01 NOTE — Therapy (Signed)
[place] MAIN [facility] SERVICES [address] [place], [place], [address] Phone: [phone]   Fax:  [phone]  Physical Therapy Treatment  The patient has been informed of current processes in place at Outpatient Rehab to protect patients from Covid-19 exposure including social distancing, schedule modifications, and new cleaning procedures. After discussing their particular risk with a therapist based on the patient's personal risk factors, the patient has decided to proceed with in-person therapy.   Patient Details  Name: Lisa Brady MRN: [id number] Date of Birth: [birth year] No data recorded  Encounter Date: 08/01/2020    Past Medical History:  Diagnosis Date  . Foot drop     Past Surgical History:  Procedure Laterality Date  . WISDOM TOOTH EXTRACTION  2006    There were no vitals filed for this visit.    Pelvic Floor Physical Therapy Treatment Note  SCREENING  Changes in medications, allergies, or medical history?: none    SUBJECTIVE  Patient reports: Neck is doing much better but some discomfort. Her R hip/side is achy, flared up last night. Her QLs are always tight, nothing seems to make them "better". Has had no leakage other than in the shower/pool 1-2 times. Getting urge to urinate early. Is getting early fatigue in the inner thighs/adductors first and gets fasciculations when trying to do kegels.  Precautions:  11 months PP  Pain update:  Location of pain:  Low back hip/side (shoulders/neck) Current pain:  1/10 (1/10 with turning) Max pain:  4/10  (2/10) Least pain:  0/10 (0/10) Nature of pain: tightness/achy  **still achy in the R hip (could be from [facility]) following treatment.   Patient Goals: Control her bladder/not need depends.   OBJECTIVE  Changes in:   MMT LE  - L DF 4+/5 (different rater may account for difference but could be due to spasms in back since test performed prior to  DN/manual) - R DF 5/5 (from prior session)  Observation:  R anterior/L posterior rotation at pelvis.   Strength:  Muscle fatigue reached earlier on R>L with tall-kneeling/balance exercises. Imbalance surrounding R scapula not allowing Pt. To perform I's, Y's, T's W's well. (from prior session)  Pelvic floor: TTP through R posterior PR/PC and OI as well as 5 o'clock region of posterior fourchette. TTP to to L posterior fourchette at ~ 5 o'clock. Coordinated and able to keep probe in even at ~ 45 degrees. Increased force production when TA and obliques recruited. ~ 3+/5 strength and maximal endurance at ~ 8 seconds.  (from prior session)  TODAY: not re-assessed today   ROM/Mobility:  Decreased scapular depression and downward rotation ROM in R>L scapula. Decreased L cervical rotation with painful end-feel.  (from prior session)   Palpation:  TTP to R Iliacus and B adductor brevis. Decreased fascial mobility through B lateral thigh along ITB.  Gait Analysis: (from prior session) Gait speed: 1.52ms ; anterior pelvic tilt, knee hyperextension, heel strike - with cues pelvic neutral achieved, with appropriate arm swing and decreased heel strike.     INTERVENTIONS THIS SESSION: Manual: Performed TP release and STM to B adductor brevis and R iliacus followed by MET correction for R anterior innominate x2 and MFR to B lateral thigh using static cupping technique to improve fascial mobility and decrease imbalance of musculature to decrease pain and allow for improved recruitment.  Dry-needle: Performed TPDN with a .928mneedle and standard approach as described below to decrease spasm and pain and allow for improved  balance of musculature for improved function and decreased symptoms.  Therex: Reviewed and discussed HEP and compiled all exercises into one list but divided into "A day, B day, and every day" to help Pt. Keep track of her HEP and not be overwhelmed by too many exercises.    Total time: 60 min.                            PT Short Term Goals - 06/13/20 0904      PT SHORT TERM GOAL #1   Title Patient will demonstrate improved pelvic alignment and balance of musculature surrounding the pelvis to facilitate decreased PFM spasms and decrease pressure on nerve roots to allow for optimal PFM function    Baseline Has L foot-drop and pain in L glutes/knee as well as inability to sense PFM and L posterior innominate rotation/spasms surrounding pelvis.    Time 5    Period Weeks    Status Achieved    Target Date 11/26/19      PT SHORT TERM GOAL #2   Title Patient will demonstrate HEP x1 in the clinic to demonstrate understanding and proper form to allow for further improvement.    Baseline Pt. lacks knowledge of therapeutic ecersise that will decrease Sx.    Time 5    Period Weeks    Status Achieved    Target Date 11/26/19      PT SHORT TERM GOAL #3   Title Pt. will be able to walk 250 Ft.with CGA and w/o assistive device to demonstrate improved balance/decreased instability and foot drop.    Baseline Pt. using RW to AMB due to L foot-drop and increased instability.    Time 5    Period Weeks    Status Achieved    Target Date 08/20/19      PT SHORT TERM GOAL #4   Title Patient will demonstrate a coordinated contraction, relaxation, and bulge of the pelvic floor muscles to demonstrate functional recruitment and motion and allow for further strengthening.    Baseline Pt. reports inability to sense the PFM for kegel or relaxation, having mixed UI    Time 5    Period Weeks    Status Achieved    Target Date 08/20/19             PT Long Term Goals - 06/13/20 0904      PT LONG TERM GOAL #1   Title Patient will report no episodes of UUI/SUI over the course of the prior two weeks to demonstrate improved functional ability.    Baseline Having UUI at toiletside, h/o mild SUI before and during pregnancy As of 1/29: had 2 little episodes  one day over the past week. As of 4/30: She had been doing better but then took a step backward when she was sick and vomiting a lot. As of 7/9: Pt. continues to have decreased incontinence but had an episode of total bladder loss and UUI ~ 1-2 times/day still (worsened due to overuse/spasm of LB musculature.    Time 10    Period Weeks    Status On-going    Target Date 08/22/20      PT LONG TERM GOAL #2   Title Patient will report no pain with intercourse to demonstrate improved functional ability.    Baseline having mild pain with initial penetration As of 1:29: had some pain at initial penetration and with deeper thrusting at ~ 3-4/10 but "  not terrible"    Time 10    Period Weeks    Status On-going    Target Date 08/22/20      PT LONG TERM GOAL #3   Title Patient will score at or below 22/100 on the UDI and 27/100 on the UIQ to demonstrate a clinically meaningful decrease in disability and distress due to pelvic floor dysfunction.    Baseline UDI: 67, UIQ: 72/100 10-19-19: UDI 55.5, UIQ 76/100; POPDI 11/100 As of 1/29: UDI:33/100, UIQ: 6/100, POPDI:6/100. As of 4/30: Due to set-back from    Time 10    Period Weeks    Status On-going    Target Date 08/22/20      PT LONG TERM GOAL #4   Title Pt. 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    Baseline Pt. requiring RW for AMB due to L foot-drop and imbalance. As of 10/12: Pt. requiring some support (e.g. grocery cart) for > 20 min. of AMB or stairs.    Time 10    Period Weeks    Status Achieved    Target Date 11/25/20                 Plan - 08/01/20 0734    Clinical Impression Statement Pt. Responded well to all interventions today, demonstrating decreased spasm and fascial restriction, improved awareness of how to best manage her HEP, as well as understanding and correct performance of all education and exercises  provided today. They will continue to benefit from skilled physical therapy to work toward remaining goals and maximize function as well as decrease likelihood of symptom increase or recurrence.     PT Next Visit Plan TPDN to R posterior scapular stabilizers and perform PNF for the scapula and rainbows on the R.re-assess rib mobility and obliques/diaphragm, re-assess internally, review deep-core coordination (tall kneel, knee step through, stepping, lean-backs) stabilize gait pattern; Internal TP release to scar at posterior fourchette, treat spinal mobility to improve diaphragmcoordination and reduce L LE radiating symptoms; TP release vs. DN to, hip-flexors, and R pectineus. re-assess for Piriformis spasm (have performed DN and TP release now); check in with long term HEP performance    PT Home Exercise Plan A day: Teapot, Side-planks, Planks, Dorothy's, scapular rows and I's, Y's, T's, and W's (temp. rows only and stretch levator scap on R. Door stretch,B [id number] with leg lift Child's pose V-sit/ single leg hamstring stretch, side-lying hip ABD. Side stretch, modified bridge, Piriformis stretch, frog stretch Every day: Kegels 3-5x/day with long-hold and pelvic tilt with sneeze, laugh/cough kegel, vaginal weights, tall kneeling, tall-kneeling step-through, lean-backs, Corrective: Upper cervical rotationTennis ball releaseadded MET for R anterior for 1 week.    Consulted and Agree with Plan of Care Patient           Patient will benefit from skilled therapeutic intervention in order to improve the following deficits and impairments:     Visit Diagnosis: No diagnosis found.     Problem List Patient Active Problem List   Diagnosis Date Noted  . Urinary incontinence 08/17/2019  . Foot drop, left 08/17/2019  . Maternal varicella, non-immune 11/20/2018   Willa Rough DPT, ATC Willa Rough 08/01/2020, 11:10 AM  [place] MAIN [facility]  SERVICES [address] [place], [place], [address] Phone: [phone]   Fax:  [phone]  Name: Lisa Brady MRN:  [id number] Date of Birth: 11/24/1986

## 2020-08-01 NOTE — Patient Instructions (Addendum)
A day: Teapot, Side-planks, Planks, Dorothy's, scapular rows and I's, Y's, T's, and W's (temp. rows only and stretch levator scap on R. Door stretch,  B [id number] with leg lift Child's pose V-sit/ single leg hamstring stretch, side-lying hip ABD. Side stretch, modified bridge, Piriformis stretch, frog stretch   Every day: Kegels 3-5x/day with long-hold and pelvic tilt with sneeze, laugh/cough kegel, vaginal weights, tall kneeling, tall-kneeling step-through, lean-backs,   Corrective:  Upper cervical rotation Tennis ball release added MET for R anterior for 1 week.

## 2020-08-08 ENCOUNTER — Ambulatory Visit: Payer: No Typology Code available for payment source

## 2020-08-22 ENCOUNTER — Other Ambulatory Visit: Payer: Self-pay

## 2020-08-22 ENCOUNTER — Ambulatory Visit: Payer: No Typology Code available for payment source | Attending: Obstetrics and Gynecology

## 2020-08-22 DIAGNOSIS — M62838 Other muscle spasm: Secondary | ICD-10-CM | POA: Insufficient documentation

## 2020-08-22 DIAGNOSIS — M21372 Foot drop, left foot: Secondary | ICD-10-CM | POA: Insufficient documentation

## 2020-08-22 DIAGNOSIS — M533 Sacrococcygeal disorders, not elsewhere classified: Secondary | ICD-10-CM | POA: Diagnosis present

## 2020-08-22 NOTE — Therapy (Signed)
[place] MAIN [facility] SERVICES [address] [place], [place], [address] Phone: [phone]   Fax:  [phone]  Physical Therapy Treatment  The patient has been informed of current processes in place at Outpatient Rehab to protect patients from Covid-19 exposure including social distancing, schedule modifications, and new cleaning procedures. After discussing their particular risk with a therapist based on the patient's personal risk factors, the patient has decided to proceed with in-person therapy.  Patient Details  Name: Lisa Brady MRN: [id number] Date of Birth: 1986-06-27 No data recorded  Encounter Date: 08/22/2020   PT End of Session - 08/22/20 0833    Visit Number [id number]    Number of Visits 49    Date for PT Re-Evaluation 08/22/20    Authorization Type [place]    Authorization - Visit Number 6    Authorization - Number of Visits 10    Progress Note Due on Visit 10    PT Start Time 0730    PT Stop Time 0830    PT Time Calculation (min) 60 min    Activity Tolerance Patient tolerated treatment well;No increased pain    Behavior During Therapy WFL for tasks assessed/performed           Past Medical History:  Diagnosis Date  . Foot drop     Past Surgical History:  Procedure Laterality Date  . WISDOM TOOTH EXTRACTION  2006    There were no vitals filed for this visit.   Pelvic Floor Physical Therapy Treatment Note  SCREENING  Changes in medications, allergies, or medical history?: none    SUBJECTIVE  Patient reports: Has had a rough week, has not done her PT exercises for 4 days which she feels has made her a little weaker. Leading up to that was going pretty well. Has not had any accidents that she can think of. Has had a little frequency but may have been de to drinking alcohol or being constipated. Has still had some leakage in the shower/pool. Had a HA last night.  Precautions:  14 months PP  Pain  update:  Location of pain:  Low back hip/side (shoulders/neck) Current pain:  0/10 (0/10 with turning) Max pain:  3/10  (2/10) Least pain:  0/10 (0/10) Nature of pain: tightness/achy  **tender at pubic symphysis from treatment  Patient Goals: Control her bladder/not need depends.   OBJECTIVE  Changes in:    Observation:  R ASIS low, PSIS level. L pubic ramus low   ROM/Mobility:  Decreased scapular depression and downward rotation ROM in R>L scapula. Decreased L cervical rotation with painful end-feel.  (from prior session)  Decreased mobility at pubic symphysis and pain with pressure at L Pubic ramus   Strength:  Muscle fatigue reached earlier on R>L with tall-kneeling/balance exercises. Imbalance surrounding R scapula not allowing Pt. To perform I's, Y's, T's W's well. (from prior session)  MMT LE  - L DF 4+/5 (different rater may account for difference but could be due to spasms in back since test performed prior to DN/manual) - R DF 5/5 (from prior session)  Pelvic floor: TTP through R posterior PR/PC and OI as well as 5 o'clock region of posterior fourchette. TTP to to L posterior fourchette at ~ 5 o'clock. Coordinated and able to keep probe in even at ~ 45 degrees. Increased force production when TA and obliques recruited. ~ 3+/5 strength and maximal endurance at ~ 8 seconds.  (from prior session)  TODAY: not re-assessed  today   Palpation:  TTP to L pectineus and rectus abdominus at umbilicus and below. Highly ticklish and TTP surrounding R scapula.  Gait Analysis: (from prior session) Gait speed: 1.29ms ; anterior pelvic tilt, knee hyperextension, heel strike - with cues pelvic neutral achieved, with appropriate arm swing and decreased heel strike.     INTERVENTIONS THIS SESSION: Manual: Performed TP release and STM to  L pectineus and rectus abdominus followed by grade 2-3 inferior to superior and AP mobs to L pubic ramus to decrease spasm and pain, improve  pelvic alignment, and allow for improved balance of musculature for improved function and decreased symptoms.  Dry-needle: Performed TPDN with a .30x655mneedle and standard approach as described below to decrease spasm and pain and allow for improved balance of musculature for improved function and decreased symptoms.  Therex: Reviewed side-lying hip ABD and mini-marches to ensure correct performance and maximal efficacy. Discussed options for pilates as maintenance option long-term.  Total time: 60 min.                             PT Short Term Goals - 06/13/20 0904      PT SHORT TERM GOAL #1   Title Patient will demonstrate improved pelvic alignment and balance of musculature surrounding the pelvis to facilitate decreased PFM spasms and decrease pressure on nerve roots to allow for optimal PFM function    Baseline Has L foot-drop and pain in L glutes/knee as well as inability to sense PFM and L posterior innominate rotation/spasms surrounding pelvis.    Time 5    Period Weeks    Status Achieved    Target Date 11/26/19      PT SHORT TERM GOAL #2   Title Patient will demonstrate HEP x1 in the clinic to demonstrate understanding and proper form to allow for further improvement.    Baseline Pt. lacks knowledge of therapeutic ecersise that will decrease Sx.    Time 5    Period Weeks    Status Achieved    Target Date 11/26/19      PT SHORT TERM GOAL #3   Title Pt. will be able to walk 250 Ft.with CGA and w/o assistive device to demonstrate improved balance/decreased instability and foot drop.    Baseline Pt. using RW to AMB due to L foot-drop and increased instability.    Time 5    Period Weeks    Status Achieved    Target Date 08/20/19      PT SHORT TERM GOAL #4   Title Patient will demonstrate a coordinated contraction, relaxation, and bulge of the pelvic floor muscles to demonstrate functional recruitment and motion and allow for further strengthening.     Baseline Pt. reports inability to sense the PFM for kegel or relaxation, having mixed UI    Time 5    Period Weeks    Status Achieved    Target Date 08/20/19             PT Long Term Goals - 06/13/20 0904      PT LONG TERM GOAL #1   Title Patient will report no episodes of UUI/SUI over the course of the prior two weeks to demonstrate improved functional ability.    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 As of 4/30: She had been doing better but  then took a step backward when she was sick and vomiting a lot. As of 7/9: Pt. continues to have decreased incontinence but had an episode of total bladder loss and UUI ~ 1-2 times/day still (worsened due to overuse/spasm of LB musculature.    Time 10    Period Weeks    Status On-going    Target Date 08/22/20      PT LONG TERM GOAL #2   Title Patient will report no pain with intercourse to demonstrate improved functional ability.    Baseline having mild pain with initial penetration As of 1:29: had some pain at initial penetration and with deeper thrusting at ~ 3-4/10 but "not terrible"    Time 10    Period Weeks    Status On-going    Target Date 08/22/20      PT LONG TERM GOAL #3   Title Patient will score at or below 22/100 on the UDI and 27/100 on the UIQ to demonstrate a clinically meaningful decrease in disability and distress due to pelvic floor dysfunction.    Baseline UDI: 67, UIQ: 72/100 10-19-19: UDI 55.5, UIQ 76/100; POPDI 11/100 As of 1/29: UDI:33/100, UIQ: 6/100, POPDI:6/100. As of 4/30: Due to set-back from    Time 10    Period Weeks    Status On-going    Target Date 08/22/20      PT LONG TERM GOAL #4   Title Pt. 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    Baseline Pt. requiring RW for AMB due to L foot-drop and  imbalance. As of 10/12: Pt. requiring some support (e.g. grocery cart) for > 20 min. of AMB or stairs.    Time 10    Period Weeks    Status Achieved    Target Date 11/25/20                 Plan - 08/22/20 0834    Clinical Impression Statement Pt. Responded well to all interventions today, demonstrating improved pubic symphysis alignment, decreased rectus and pectineus spasm, as well as understanding and correct performance of all education and exercises provided today. They will continue to benefit from skilled physical therapy to work toward remaining goals and maximize function as well as decrease likelihood of symptom increase or recurrence.    PT Next Visit Plan TPDN to R posterior scapular stabilizers and perform PNF for the scapula and rainbows on the R.re-assess rib mobility and obliques/diaphragm, re-assess internally, review deep-core coordination (tall kneel, knee step through, stepping, lean-backs) stabilize gait pattern; Internal TP release to scar at posterior fourchette, treat spinal mobility to improve diaphragmcoordination and reduce L LE radiating symptoms; TP release vs. DN to, hip-flexors, and R pectineus. re-assess for Piriformis spasm (have performed DN and TP release now); check in with long term HEP performance    PT Home Exercise Plan A day: Teapot, Side-planks, Planks, Dorothy's, scapular rows and I's, Y's, T's, and W's (temp. rows only and stretch levator scap on R. Door stretch,B [id number] with leg lift Child's pose V-sit/ single leg hamstring stretch, side-lying hip ABD. Side stretch, modified bridge, Piriformis stretch, frog stretch Every day: Kegels 3-5x/day with long-hold and pelvic tilt with sneeze, laugh/cough kegel, vaginal weights, tall kneeling, tall-kneeling step-through, lean-backs, Corrective: Upper cervical rotationTennis ball releaseadded MET for R anterior for 1 week.    Consulted and Agree with Plan of Care Patient  Patient will  benefit from skilled therapeutic intervention in order to improve the following deficits and impairments:     Visit Diagnosis: Other muscle spasm  Foot drop, left  Sacrococcygeal disorders, not elsewhere classified     Problem List Patient Active Problem List   Diagnosis Date Noted  . Urinary incontinence 08/17/2019  . Foot drop, left 08/17/2019  . Maternal varicella, non-immune 11/20/2018   Willa Rough DPT, ATC Willa Rough 08/22/2020, 9:01 AM  [place] MAIN [facility] SERVICES [address] [place], [place], [address] Phone: [phone]   Fax:  [phone]  Name: Mckaylah Bettendorf MRN: [id number] Date of Birth: 14-Jan-1986

## 2020-09-08 ENCOUNTER — Other Ambulatory Visit: Payer: Self-pay | Admitting: Family Medicine

## 2020-09-08 DIAGNOSIS — R634 Abnormal weight loss: Secondary | ICD-10-CM

## 2020-09-08 NOTE — Progress Notes (Signed)
Spoke with patient in office. She has had 10 pound weight loss over last several months without change in diet/ exercise. Lab orders placed.

## 2020-09-10 ENCOUNTER — Other Ambulatory Visit (INDEPENDENT_AMBULATORY_CARE_PROVIDER_SITE_OTHER): Payer: No Typology Code available for payment source

## 2020-09-10 DIAGNOSIS — R634 Abnormal weight loss: Secondary | ICD-10-CM

## 2020-09-10 LAB — CBC WITH DIFFERENTIAL/PLATELET
Basophils Absolute: 0.1 10*3/uL (ref 0.0–0.1)
Basophils Relative: 1.4 % (ref 0.0–3.0)
Eosinophils Absolute: 0 10*3/uL (ref 0.0–0.7)
Eosinophils Relative: 0.5 % (ref 0.0–5.0)
HCT: 42.7 % (ref 36.0–46.0)
Hemoglobin: 14.4 g/dL (ref 12.0–15.0)
Lymphocytes Relative: 34.8 % (ref 12.0–46.0)
Lymphs Abs: 2.8 10*3/uL (ref 0.7–4.0)
MCHC: 33.8 g/dL (ref 30.0–36.0)
MCV: 88.6 fl (ref 78.0–100.0)
Monocytes Absolute: 0.6 10*3/uL (ref 0.1–1.0)
Monocytes Relative: 7.8 % (ref 3.0–12.0)
Neutro Abs: 4.5 10*3/uL (ref 1.4–7.7)
Neutrophils Relative %: 55.5 % (ref 43.0–77.0)
Platelets: 245 10*3/uL (ref 150.0–400.0)
RBC: 4.82 Mil/uL (ref 3.87–5.11)
RDW: 13.7 % (ref 11.5–15.5)
WBC: 8.2 10*3/uL (ref 4.0–10.5)

## 2020-09-10 LAB — T3: T3, Total: 116 ng/dL (ref 76–181)

## 2020-09-10 LAB — T4, FREE: Free T4: 0.95 ng/dL (ref 0.60–1.60)

## 2020-09-10 LAB — TSH: TSH: 1.39 u[IU]/mL (ref 0.35–4.50)

## 2020-09-12 ENCOUNTER — Ambulatory Visit: Payer: No Typology Code available for payment source | Attending: Obstetrics and Gynecology

## 2020-09-12 ENCOUNTER — Other Ambulatory Visit: Payer: Self-pay

## 2020-09-12 DIAGNOSIS — M533 Sacrococcygeal disorders, not elsewhere classified: Secondary | ICD-10-CM | POA: Insufficient documentation

## 2020-09-12 DIAGNOSIS — M21372 Foot drop, left foot: Secondary | ICD-10-CM | POA: Diagnosis present

## 2020-09-12 DIAGNOSIS — M62838 Other muscle spasm: Secondary | ICD-10-CM | POA: Insufficient documentation

## 2020-09-12 NOTE — Therapy (Signed)
[place] MAIN [facility] SERVICES [address] [place], [place], [address] Phone: [phone]   Fax:  [phone]  Physical Therapy Treatment  The patient has been informed of current processes in place at Outpatient Rehab to protect patients from Covid-19 exposure including social distancing, schedule modifications, and new cleaning procedures. After discussing their particular risk with a therapist based on the patient's personal risk factors, the patient has decided to proceed with in-person therapy.   Patient Details  Name: Lisa Brady MRN: [id number] Date of Birth: January 03, 1986 No data recorded  Encounter Date: 09/12/2020   PT End of Session - 09/12/20 0851    Visit Number 50    Number of Visits 10    Date for PT Re-Evaluation 08/22/20    Authorization Type [employer]    Authorization - Visit Number 7    Authorization - Number of Visits 10    Progress Note Due on Visit 10    PT Start Time 0730    PT Stop Time 0830    PT Time Calculation (min) 60 min    Activity Tolerance Patient tolerated treatment well;No increased pain    Behavior During Therapy WFL for tasks assessed/performed           Past Medical History:  Diagnosis Date  . Foot drop     Past Surgical History:  Procedure Laterality Date  . WISDOM TOOTH EXTRACTION  2006    There were no vitals filed for this visit.   Pelvic Floor Physical Therapy Treatment Note  SCREENING  Changes in medications, allergies, or medical history?: none    SUBJECTIVE  Patient reports: Has had an accident walking downstairs while carrying the baby (had to change underwear), once while washing dishes. Has felt constipated over the last few weeks even though she is not straining. Has done 2 piliates classes. The first one is an assessment and 1 private session. Has had some LB/crampiness and her neck had bothered her for ~ 2 weeks   Precautions:  14 months PP  Pain  update:  Location of pain:  Low back hip/side (shoulders/neck) Current pain:  0/10 (0/10 with turning) Max pain:  2/10  (4/10) Least pain:  0/10 (0/10) Nature of pain: tightness/achy  **no increased pain following treatment   Patient Goals: Control her bladder/not need depends.   OBJECTIVE  Changes in:   Observation:  R ASIS low, PSIS level. L pubic ramus low   ROM/Mobility:  Decreased scapular depression and downward rotation ROM in R>L scapula. Decreased L cervical rotation with painful end-feel.  (from prior session)  Decreased mobility at pubic symphysis and pain with pressure at L Pubic ramus   Strength:  Muscle fatigue reached earlier on R>L with tall-kneeling/balance exercises. Imbalance surrounding R scapula not allowing Pt. To perform I's, Y's, T's W's well. (from prior session)  MMT LE  - L DF 4+/5 (different rater may account for difference but could be due to spasms in back since test performed prior to DN/manual) - R DF 5/5 (from prior session)  Pelvic floor: TTP through R posterior PR/PC and OI as well as 5 o'clock region of posterior fourchette. TTP to to L posterior fourchette at ~ 5 o'clock. Coordinated and able to keep probe in even at ~ 45 degrees. Increased force production when TA and obliques recruited. ~ 3+/5 strength and maximal endurance at ~ 8 seconds.  (from prior session)  TODAY: not re-assessed today   Palpation:  TTP to L  pectineus and rectus abdominus at umbilicus and below. Highly ticklish and TTP surrounding R scapula.  Gait Analysis: (from prior session) Gait speed: 1.34ms ; anterior pelvic tilt, knee hyperextension, heel strike - with cues pelvic neutral achieved, with appropriate arm swing and decreased heel strike.     INTERVENTIONS THIS SESSION: Manual: Performed TP release to R cervical extensors at C2 and C3-4. Followed by L to R mobs at C3 to decrease neck pain and sensitivty. Performed cuping to B scapular region to decrease  fascial restriction and allow for improved scapular tracing and stabilizer activation. Performed TP release to R Psoas followed by MET correction x1 to decreased spasm and pressure on genitofemoral nerve and improve pelvic alignment.  Therex: Reviewed bow-and-arrow and emphasized R rotation to improve mobility of joint and surrounding connective tissue and decrease pressure on nerve roots for improved conductivity and function of down-stream tissues.   Total time: 60 min.                             PT Short Term Goals - 06/13/20 0904      PT SHORT TERM GOAL #1   Title Patient will demonstrate improved pelvic alignment and balance of musculature surrounding the pelvis to facilitate decreased PFM spasms and decrease pressure on nerve roots to allow for optimal PFM function    Baseline Has L foot-drop and pain in L glutes/knee as well as inability to sense PFM and L posterior innominate rotation/spasms surrounding pelvis.    Time 5    Period Weeks    Status Achieved    Target Date 11/26/19      PT SHORT TERM GOAL #2   Title Patient will demonstrate HEP x1 in the clinic to demonstrate understanding and proper form to allow for further improvement.    Baseline Pt. lacks knowledge of therapeutic ecersise that will decrease Sx.    Time 5    Period Weeks    Status Achieved    Target Date 11/26/19      PT SHORT TERM GOAL #3   Title Pt. will be able to walk 250 Ft.with CGA and w/o assistive device to demonstrate improved balance/decreased instability and foot drop.    Baseline Pt. using RW to AMB due to L foot-drop and increased instability.    Time 5    Period Weeks    Status Achieved    Target Date 08/20/19      PT SHORT TERM GOAL #4   Title Patient will demonstrate a coordinated contraction, relaxation, and bulge of the pelvic floor muscles to demonstrate functional recruitment and motion and allow for further strengthening.    Baseline Pt. reports inability to  sense the PFM for kegel or relaxation, having mixed UI    Time 5    Period Weeks    Status Achieved    Target Date 08/20/19             PT Long Term Goals - 06/13/20 0904      PT LONG TERM GOAL #1   Title Patient will report no episodes of UUI/SUI over the course of the prior two weeks to demonstrate improved functional ability.    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 As of 4/30: She had been doing better but then took a step backward when she was sick and vomiting  a lot. As of 7/9: Pt. continues to have decreased incontinence but had an episode of total bladder loss and UUI ~ 1-2 times/day still (worsened due to overuse/spasm of LB musculature.    Time 10    Period Weeks    Status On-going    Target Date 08/22/20      PT LONG TERM GOAL #2   Title Patient will report no pain with intercourse to demonstrate improved functional ability.    Baseline having mild pain with initial penetration As of 1:29: had some pain at initial penetration and with deeper thrusting at ~ 3-4/10 but "not terrible"    Time 10    Period Weeks    Status On-going    Target Date 08/22/20      PT LONG TERM GOAL #3   Title Patient will score at or below 22/100 on the UDI and 27/100 on the UIQ to demonstrate a clinically meaningful decrease in disability and distress due to pelvic floor dysfunction.    Baseline UDI: 67, UIQ: 72/100 10-19-19: UDI 55.5, UIQ 76/100; POPDI 11/100 As of 1/29: UDI:33/100, UIQ: 6/100, POPDI:6/100. As of 4/30: Due to set-back from    Time 10    Period Weeks    Status On-going    Target Date 08/22/20      PT LONG TERM GOAL #4   Title Pt. 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    Baseline Pt. requiring RW for AMB due to L foot-drop and imbalance. As of 10/12: Pt. requiring  some support (e.g. grocery cart) for > 20 min. of AMB or stairs.    Time 10    Period Weeks    Status Achieved    Target Date 11/25/20                 Plan - 09/12/20 0851    Clinical Impression Statement Pt. Responded well to all interventions today, demonstrating improved fascial mobility, decreased spasm and pain and improved pelvic alignment as well as understanding and correct performance of all education and exercises provided today. They will continue to benefit from skilled physical therapy to work toward remaining goals and maximize function as well as decrease likelihood of symptom increase or recurrence.    PT Next Visit Plan TPDN to R posterior scapular stabilizers and spinal mobilization/ rotational mobs into R rotation and perform PNF for the scapula and rainbows on the R.re-assess rib mobility and obliques/diaphragm, re-assess internally, review deep-core coordination (tall kneel, knee step through, stepping, lean-backs) stabilize gait pattern; Internal TP release to scar at posterior fourchette, treat spinal mobility to improve diaphragmcoordination and reduce L LE radiating symptoms; TP release vs. DN to, hip-flexors, and R pectineus. re-assess for Piriformis spasm (have performed DN and TP release now); check in with long term HEP performance    PT Home Exercise Plan A day: Teapot, Side-planks, Planks, Dorothy's, scapular rows and I's, Y's, T's, and W's (temp. rows only and stretch levator scap on R. Door stretch,B [id number] with leg lift Child's pose V-sit/ single leg hamstring stretch, side-lying hip ABD. Side stretch, modified bridge, Piriformis stretch, frog stretch Every day: Kegels 3-5x/day with long-hold and pelvic tilt with sneeze, laugh/cough kegel, vaginal weights, tall kneeling, tall-kneeling step-through, lean-backs, Corrective: Upper cervical rotationTennis ball releaseadded MET for R anterior for 1 week.    Consulted and Agree with Plan of Care Patient  Patient will benefit from skilled therapeutic intervention in order to improve the following deficits and impairments:     Visit Diagnosis: Other muscle spasm  Foot drop, left  Sacrococcygeal disorders, not elsewhere classified     Problem List Patient Active Problem List   Diagnosis Date Noted  . Urinary incontinence 08/17/2019  . Foot drop, left 08/17/2019  . Maternal varicella, non-immune 11/20/2018   Willa Rough DPT, ATC Willa Rough 09/12/2020, 8:53 AM  [place] MAIN [facility] SERVICES [address] [place], [place], [address] Phone: [phone]   Fax:  [phone]  Name: Lisa Brady MRN: [id number] Date of Birth: [birth year]

## 2020-09-17 ENCOUNTER — Encounter: Payer: Self-pay | Admitting: Family Medicine

## 2020-09-17 ENCOUNTER — Ambulatory Visit (INDEPENDENT_AMBULATORY_CARE_PROVIDER_SITE_OTHER): Payer: No Typology Code available for payment source | Admitting: Family Medicine

## 2020-09-17 VITALS — Wt 115.4 lb

## 2020-09-17 DIAGNOSIS — L819 Disorder of pigmentation, unspecified: Secondary | ICD-10-CM

## 2020-09-17 DIAGNOSIS — R634 Abnormal weight loss: Secondary | ICD-10-CM | POA: Diagnosis not present

## 2020-09-17 DIAGNOSIS — M25569 Pain in unspecified knee: Secondary | ICD-10-CM | POA: Insufficient documentation

## 2020-09-17 DIAGNOSIS — G8929 Other chronic pain: Secondary | ICD-10-CM | POA: Insufficient documentation

## 2020-09-17 LAB — HIGH SENSITIVITY CRP: CRP, High Sensitivity: 0.92 mg/L (ref 0.000–5.000)

## 2020-09-17 LAB — COMPREHENSIVE METABOLIC PANEL
ALT: 41 U/L — ABNORMAL HIGH (ref 0–35)
AST: 28 U/L (ref 0–37)
Albumin: 4.5 g/dL (ref 3.5–5.2)
Alkaline Phosphatase: 32 U/L — ABNORMAL LOW (ref 39–117)
BUN: 10 mg/dL (ref 6–23)
CO2: 29 mEq/L (ref 19–32)
Calcium: 9.3 mg/dL (ref 8.4–10.5)
Chloride: 102 mEq/L (ref 96–112)
Creatinine, Ser: 0.69 mg/dL (ref 0.40–1.20)
GFR: 112.08 mL/min (ref >60.00)
Glucose, Bld: 109 mg/dL — ABNORMAL HIGH (ref 70–99)
Potassium: 4.4 mEq/L (ref 3.5–5.1)
Sodium: 137 mEq/L (ref 135–145)
Total Bilirubin: 1.3 mg/dL — ABNORMAL HIGH (ref 0.2–1.2)
Total Protein: 7.4 g/dL (ref 6.0–8.3)

## 2020-09-17 LAB — URINALYSIS, ROUTINE W REFLEX MICROSCOPIC
Bilirubin Urine: NEGATIVE
Hgb urine dipstick: NEGATIVE
Ketones, ur: NEGATIVE
Leukocytes,Ua: NEGATIVE
Nitrite: NEGATIVE
RBC / HPF: NONE SEEN (ref 0–?)
Specific Gravity, Urine: 1.01 (ref 1.000–1.030)
Total Protein, Urine: NEGATIVE
Urine Glucose: NEGATIVE
Urobilinogen, UA: 0.2 (ref 0.0–1.0)
pH: 6 (ref 5.0–8.0)

## 2020-09-17 LAB — HEMOGLOBIN A1C: Hgb A1c MFr Bld: 5.3 % (ref 4.6–6.5)

## 2020-09-17 LAB — SEDIMENTATION RATE: Sed Rate: 5 mm/hr (ref 0–20)

## 2020-09-17 NOTE — Progress Notes (Signed)
   Subjective:    Patient ID: Lisa Brady, female    DOB: May 25, 1986, 34 y.o.   MRN: [id number]  HPI Chief Complaint  Patient presents with  . weight loss   This a 34 yo female who presents today with unintended weight loss. Had labs done 09/10/2020, nml CBC, TSH, free T4, T3.   Normal appetite. Eating 3 meals a day. Slightly decreased appetite last week, otherwise normal.   History of low blood pressures with some orthostatic symptoms. Rare, brief ringing in ears.   Some increased stress, up and down. Sleeping better with son getting older. Still doing pelvic floor pt/ exercises, 10-20 minute walk most days, reformer pilates 1x/ week. No excessive exercise.    Review of Systems No fatigue, fever, night sweats, cough, abdominal pain, dark stools, blood in stool, myalgias, arthralgias, polyuria.     Objective:   Physical Exam Physical Exam  Constitutional: Oriented to person, place, and time. Appears well-developed and well-nourished.  HENT:  Head: Normocephalic and atraumatic.  Eyes: Conjunctivae are normal.  Neck: Normal range of motion. Neck supple.  Cardiovascular: Normal rate, regular rhythm and normal heart sounds.   Pulmonary/Chest: Effort normal and breath sounds normal.  Musculoskeletal: No lower extremity edema.   Neurological: Alert and oriented to person, place, and time.  Skin: Skin is warm and dry.  Psychiatric: Normal mood and affect. Behavior is normal. Judgment and thought content normal.  Vitals reviewed.    Wt 115 lb 6.4 oz (52.3 kg)   SpO2 96%   Breastfeeding No   BMI 20.44 kg/m  Wt Readings from Last 3 Encounters:  09/17/20 115 lb 6.4 oz (52.3 kg)  12/26/19 128 lb (58.1 kg)  12/03/19 123 lb (55.8 kg)    Orthostatic VS for the past 72 hrs (Last 3 readings):  Orthostatic BP Patient Position BP Location Cuff Size Orthostatic Pulse  09/17/20 1342 106/62 Standing Right Arm Normal 96  09/17/20 1341 98/62 Sitting Right Arm Normal 81  09/17/20  1303 104/64 Supine Right Arm Normal 77       Assessment & Plan:  1. Unintentional weight loss of 10% body weight within 6 months - continue to monitor weight periodically, will check labs/ urine - Comprehensive metabolic panel - Hemoglobin A1c - Urinalysis, Routine w reflex microscopic - POC Hemoccult Bld/Stl (3-Cd Home Screen); Future - Sedimentation rate - High sensitivity CRP  2. Change in color of pigmented skin lesion - Ambulatory referral to Dermatology  This visit occurred during the SARS-CoV-2 public health emergency.  Safety protocols were in place, including screening questions prior to the visit, additional usage of staff PPE, and extensive cleaning of exam room while observing appropriate contact time as indicated for disinfecting solutions.    Clarene Reamer, FNP-BC  [facility]  09/17/2020 1:48 PM

## 2020-09-26 ENCOUNTER — Ambulatory Visit: Payer: No Typology Code available for payment source

## 2020-09-26 ENCOUNTER — Other Ambulatory Visit: Payer: Self-pay

## 2020-09-26 DIAGNOSIS — M21372 Foot drop, left foot: Secondary | ICD-10-CM

## 2020-09-26 DIAGNOSIS — M533 Sacrococcygeal disorders, not elsewhere classified: Secondary | ICD-10-CM

## 2020-09-26 DIAGNOSIS — M62838 Other muscle spasm: Secondary | ICD-10-CM | POA: Diagnosis not present

## 2020-09-26 NOTE — Therapy (Signed)
[place] MAIN [facility] SERVICES [address] [place], [place], [address] Phone: [phone]   Fax:  [phone]  Physical Therapy Treatment  The patient has been informed of current processes in place at Outpatient Rehab to protect patients from Covid-19 exposure including social distancing, schedule modifications, and new cleaning procedures. After discussing their particular risk with a therapist based on the patient's personal risk factors, the patient has decided to proceed with in-person therapy.  Patient Details  Name: Lisa Brady MRN: [id number] Date of Birth: 05-Apr-1986 No data recorded  Encounter Date: 09/26/2020   PT End of Session - 09/26/20 0840    Visit Number 9    Number of Visits 50    Date for PT Re-Evaluation 08/22/20    Authorization Type [employer]    Authorization - Visit Number 8    Authorization - Number of Visits 10    Progress Note Due on Visit 10    PT Start Time 0730    PT Stop Time 0830    PT Time Calculation (min) 60 min    Activity Tolerance Patient tolerated treatment well;No increased pain    Behavior During Therapy WFL for tasks assessed/performed           Past Medical History:  Diagnosis Date  . Foot drop     Past Surgical History:  Procedure Laterality Date  . WISDOM TOOTH EXTRACTION  2006    There were no vitals filed for this visit.  Pelvic Floor Physical Therapy Treatment Note  SCREENING  Changes in medications, allergies, or medical history?: none    SUBJECTIVE  Patient reports: Has had one accident (while washing hands) since last visit. Has been feeling some pressure and urgency more last week then this week. It seems like it is worse when she does more holding the baby then gradually eases over a few days where she is at work more.   Precautions:  14 months PP  Pain update:  Location of pain:  Low back hip/side (shoulders R shoulder) Current pain:  0/10 (0/10 with  turning) Max pain:  2/10  (2/10) Least pain:  0/10 (0/10) Nature of pain: tightness/achy  **no increased pain following treatment   Patient Goals: Control her bladder/not need depends.   OBJECTIVE  Changes in:   Observation:  R ASIS low, PSIS level. L pubic ramus low (not re-assessed today)  R scapula protracted and winging>L   ROM/Mobility:  Decreased scapular depression and downward rotation ROM in R>L scapula. Decreased L cervical rotation with painful end-feel.  (from prior session)  Decreased mobility at pubic symphysis and pain with pressure at L Pubic ramus   Strength:  Muscle fatigue reached earlier on R>L with tall-kneeling/balance exercises. Imbalance surrounding R scapula not allowing Pt. To perform I's, Y's, T's W's well. (from prior session)  MMT LE  - L DF 4+/5 (different rater may account for difference but could be due to spasms in back since test performed prior to DN/manual) - R DF 5/5 (from prior session)  Pelvic floor: TTP through R posterior PR/PC and OI as well as 5 o'clock region of posterior fourchette. TTP to to L posterior fourchette at ~ 5 o'clock. Coordinated and able to keep probe in even at ~ 45 degrees. Increased force production when TA and obliques recruited. ~ 3+/5 strength and maximal endurance at ~ 8 seconds.  (from prior session)  TODAY: not re-assessed today   Palpation:  TTP to R infraspinatus, teres minor,  and subscapularis. Pain radiates to familiar pain site at anterior shoulder and down into R rib-cage.  Gait Analysis: (from prior session) Gait speed: 1.35ms ; anterior pelvic tilt, knee hyperextension, heel strike - with cues pelvic neutral achieved, with appropriate arm swing and decreased heel strike.     INTERVENTIONS THIS SESSION: Manual: Performed TP release to R infraspinatus, teres minor, and subscapularis to decreased spasm and pressure on genitofemoral nerve and improve pelvic alignment.  Therex: Educated on and  practiced straight-arm scapular retraction with red theraband to improve scapular stability and help prevent return of spasm and pain.   Total time: 60 min.                      Trigger Point Dry Needling - 09/26/20 0001    Consent Given? Yes    Education Handout Provided No    Muscles Treated Upper Quadrant Infraspinatus;Teres minor    Dry Needling Comments Right    Infraspinatus Response Twitch response elicited;Palpable increased muscle length    Subscapularis Response Twitch response elicited;Palpable increased muscle length                  PT Short Term Goals - 06/13/20 0904      PT SHORT TERM GOAL #1   Title Patient will demonstrate improved pelvic alignment and balance of musculature surrounding the pelvis to facilitate decreased PFM spasms and decrease pressure on nerve roots to allow for optimal PFM function    Baseline Has L foot-drop and pain in L glutes/knee as well as inability to sense PFM and L posterior innominate rotation/spasms surrounding pelvis.    Time 5    Period Weeks    Status Achieved    Target Date 11/26/19      PT SHORT TERM GOAL #2   Title Patient will demonstrate HEP x1 in the clinic to demonstrate understanding and proper form to allow for further improvement.    Baseline Pt. lacks knowledge of therapeutic ecersise that will decrease Sx.    Time 5    Period Weeks    Status Achieved    Target Date 11/26/19      PT SHORT TERM GOAL #3   Title Pt. will be able to walk 250 Ft.with CGA and w/o assistive device to demonstrate improved balance/decreased instability and foot drop.    Baseline Pt. using RW to AMB due to L foot-drop and increased instability.    Time 5    Period Weeks    Status Achieved    Target Date 08/20/19      PT SHORT TERM GOAL #4   Title Patient will demonstrate a coordinated contraction, relaxation, and bulge of the pelvic floor muscles to demonstrate functional recruitment and motion and allow for  further strengthening.    Baseline Pt. reports inability to sense the PFM for kegel or relaxation, having mixed UI    Time 5    Period Weeks    Status Achieved    Target Date 08/20/19             PT Long Term Goals - 06/13/20 0904      PT LONG TERM GOAL #1   Title Patient will report no episodes of UUI/SUI over the course of the prior two weeks to demonstrate improved functional ability.    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 As of 4/30:  She had been doing better but then took a step backward when she was sick and vomiting a lot. As of 7/9: Pt. continues to have decreased incontinence but had an episode of total bladder loss and UUI ~ 1-2 times/day still (worsened due to overuse/spasm of LB musculature.    Time 10    Period Weeks    Status On-going    Target Date 08/22/20      PT LONG TERM GOAL #2   Title Patient will report no pain with intercourse to demonstrate improved functional ability.    Baseline having mild pain with initial penetration As of 1:29: had some pain at initial penetration and with deeper thrusting at ~ 3-4/10 but "not terrible"    Time 10    Period Weeks    Status On-going    Target Date 08/22/20      PT LONG TERM GOAL #3   Title Patient will score at or below 22/100 on the UDI and 27/100 on the UIQ to demonstrate a clinically meaningful decrease in disability and distress due to pelvic floor dysfunction.    Baseline UDI: 67, UIQ: 72/100 10-19-19: UDI 55.5, UIQ 76/100; POPDI 11/100 As of 1/29: UDI:33/100, UIQ: 6/100, POPDI:6/100. As of 4/30: Due to set-back from    Time 10    Period Weeks    Status On-going    Target Date 08/22/20      PT LONG TERM GOAL #4   Title Pt. 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    Baseline Pt. requiring RW for AMB  due to L foot-drop and imbalance. As of 10/12: Pt. requiring some support (e.g. grocery cart) for > 20 min. of AMB or stairs.    Time 10    Period Weeks    Status Achieved    Target Date 11/25/20                 Plan - 09/26/20 0841    Clinical Impression Statement Pt. Responded well to all interventions today, demonstrating decreased spasm and TTP, improved recruitment of rhomboids, as well as understanding and correct performance of all education and exercises provided today. They will continue to benefit from skilled physical therapy to work toward remaining goals and maximize function as well as decrease likelihood of symptom increase or recurrence.    PT Next Visit Plan TPDN to R posterior scapular stabilizers and spinal mobilization/ rotational mobs into R rotation and perform PNF for the scapula and rainbows on the R.re-assess rib mobility and obliques/diaphragm, re-assess internally, review deep-core coordination (tall kneel, knee step through, stepping, lean-backs) stabilize gait pattern; Internal TP release to scar at posterior fourchette, treat spinal mobility to improve diaphragmcoordination and reduce L LE radiating symptoms; TP release vs. DN to, hip-flexors, and R pectineus. re-assess for Piriformis spasm (have performed DN and TP release now); check in with long term HEP performance    PT Home Exercise Plan A day: Teapot, Side-planks, Planks, Dorothy's, scapular rows and I's, Y's, T's, and W's (temp. rows,sacp retractions, and stretch levator scap on R. Door stretch,B [id number] with leg lift Child's pose V-sit/ single leg hamstring stretch, side-lying hip ABD. Side stretch, modified bridge, Piriformis stretch, frog stretch Every day: Kegels 3-5x/day with long-hold and pelvic tilt with sneeze, laugh/cough kegel, vaginal weights, tall kneeling, tall-kneeling step-through, lean-backs, Corrective: Upper cervical rotationTennis ball releaseadded MET for R anterior for 1  week.     Consulted and Agree with Plan of Care Patient           Patient will benefit from skilled therapeutic intervention in order to improve the following deficits and impairments:     Visit Diagnosis: Other muscle spasm  Foot drop, left  Sacrococcygeal disorders, not elsewhere classified     Problem List Patient Active Problem List   Diagnosis Date Noted  . Knee pain 09/17/2020  . Urinary incontinence 08/17/2019  . Foot drop, left 08/17/2019  . Maternal varicella, non-immune 11/20/2018   Willa Rough DPT, ATC Willa Rough 09/26/2020, 8:42 AM  [place] MAIN [facility] SERVICES [address] [place], [place], [address] Phone: [phone]   Fax:  [phone]  Name: Elli Groesbeck MRN: [id number] Date of Birth: 1986-07-31

## 2020-09-26 NOTE — Patient Instructions (Signed)
  Have your palms up and do 2x15, focus on pulling the shoulder blades back and down

## 2020-10-10 ENCOUNTER — Ambulatory Visit: Payer: No Typology Code available for payment source | Attending: Obstetrics and Gynecology

## 2020-10-10 ENCOUNTER — Other Ambulatory Visit: Payer: Self-pay

## 2020-10-10 DIAGNOSIS — M62838 Other muscle spasm: Secondary | ICD-10-CM | POA: Diagnosis not present

## 2020-10-10 DIAGNOSIS — M21372 Foot drop, left foot: Secondary | ICD-10-CM | POA: Diagnosis present

## 2020-10-10 DIAGNOSIS — M533 Sacrococcygeal disorders, not elsewhere classified: Secondary | ICD-10-CM

## 2020-10-10 NOTE — Therapy (Signed)
[place] MAIN [facility] SERVICES [address] [place], [place], [address] Phone: [phone]   Fax:  [phone]  Physical Therapy Treatment  The patient has been informed of current processes in place at Outpatient Rehab to protect patients from Covid-19 exposure including social distancing, schedule modifications, and new cleaning procedures. After discussing their particular risk with a therapist based on the patient's personal risk factors, the patient has decided to proceed with in-person therapy.  Patient Details  Name: Lisa Brady MRN: [id number] Date of Birth: [birth year] No data recorded  Encounter Date: 10/10/2020   PT End of Session - 10/10/20 1430    Visit Number 52    Number of Visits 73    Date for PT Re-Evaluation 08/22/20    Authorization Type [employer]    Authorization - Visit Number 9    Authorization - Number of Visits 10    Progress Note Due on Visit 10    PT Start Time 0730    PT Stop Time 0830    PT Time Calculation (min) 60 min    Activity Tolerance Patient tolerated treatment well;No increased pain    Behavior During Therapy WFL for tasks assessed/performed           Past Medical History:  Diagnosis Date  . Foot drop     Past Surgical History:  Procedure Laterality Date  . WISDOM TOOTH EXTRACTION  2006    There were no vitals filed for this visit.  Pelvic Floor Physical Therapy Treatment Note  SCREENING  Changes in medications, allergies, or medical history?: none    SUBJECTIVE  Patient reports: Felt like she was doing well over the weekend but then she threw up twice over the weekend and had some but not "too much" leakage. Since then she has been feeling more urgency over the last couple days. May have has a couple drops before emptying. Had a small amount of leakage last night as well when she was walking down the stairs differently than normal (maybe swinging the leg out further) Has had  some pain in R shoulder and mid back so she ended up taking pain medicine like 3 days in a row.   Precautions:  14 months PP  Pain update:  Location of pain:  Low back hip/side (shoulders R shoulder) Current pain:  1/10 (0/10 with turning) Max pain:  4/10  (3/10) Least pain:  0/10 (0/10) Nature of pain: tightness/achy  **no increased pain following treatment   Patient Goals: Control her bladder/not need depends.   OBJECTIVE  Changes in:   Observation:  R ASIS low, PSIS level. L pubic ramus low (not re-assessed today)  R scapula protracted and winging>L   ROM/Mobility:  Decreased scapular depression and downward rotation ROM in R>L scapula. Decreased L cervical rotation with painful end-feel. Decreased mobility at pubic symphysis and pain with pressure at L Pubic ramus  (from prior session)  TODAY: Pt demonstrates decreased upward rotation ROM for R scapula due to spasm/ fascial tightness that is limiting proprioception and recruitment for effective downward scapular rotation strengthening.   Strength:  Muscle fatigue reached earlier on R>L with tall-kneeling/balance exercises. Imbalance surrounding R scapula not allowing Pt. To perform I's, Y's, T's W's well. (from prior session)  MMT LE  - L DF 4+/5 (different rater may account for difference but could be due to spasms in back since test performed prior to DN/manual) - R DF 5/5 (from prior session)  Pelvic floor: TTP  through R posterior PR/PC and OI as well as 5 o'clock region of posterior fourchette. TTP to to L posterior fourchette at ~ 5 o'clock. Coordinated and able to keep probe in even at ~ 45 degrees. Increased force production when TA and obliques recruited. ~ 3+/5 strength and maximal endurance at ~ 8 seconds.  (from prior session)  TODAY: not re-assessed today   Palpation:  TTP to R latissimus dorsi and subscapularis.   Gait Analysis: (from prior session) Gait speed: 1.73ms ; anterior pelvic tilt, knee  hyperextension, heel strike - with cues pelvic neutral achieved, with appropriate arm swing and decreased heel strike.     INTERVENTIONS THIS SESSION: Manual: Performed TP release to R latissimus dorsi and subscapularis to decreased spasm and  Allow for improved scapular ROM and recruitment for decreased scapular protraction and improved posture to prevent return of spasm/pain.  NM re-ed: used PNF for scapular retraction, depression, and downward rotation of the R scapula to improve recruitment and strength to allow for improved posture and decreased Sx.  Performed TPDN with a .[id number] and standard approach as described below to decrease spasm and pain and allow for improved balance of musculature for improved function and decreased symptoms.  Therex: Reviewed and practiced tall kneeling kegel with arm-swing and tall-kneeling step-throughs to improve timing, coordination, and strength of the PFM to decrease Sx. Long-term.  Total time: 60 min.                                PT Short Term Goals - 06/13/20 0904      PT SHORT TERM GOAL #1   Title Patient will demonstrate improved pelvic alignment and balance of musculature surrounding the pelvis to facilitate decreased PFM spasms and decrease pressure on nerve roots to allow for optimal PFM function    Baseline Has L foot-drop and pain in L glutes/knee as well as inability to sense PFM and L posterior innominate rotation/spasms surrounding pelvis.    Time 5    Period Weeks    Status Achieved    Target Date 11/26/19      PT SHORT TERM GOAL #2   Title Patient will demonstrate HEP x1 in the clinic to demonstrate understanding and proper form to allow for further improvement.    Baseline Pt. lacks knowledge of therapeutic ecersise that will decrease Sx.    Time 5    Period Weeks    Status Achieved    Target Date 11/26/19      PT SHORT TERM GOAL #3   Title Pt. will be able to walk 250 Ft.with CGA and w/o  assistive device to demonstrate improved balance/decreased instability and foot drop.    Baseline Pt. using RW to AMB due to L foot-drop and increased instability.    Time 5    Period Weeks    Status Achieved    Target Date 08/20/19      PT SHORT TERM GOAL #4   Title Patient will demonstrate a coordinated contraction, relaxation, and bulge of the pelvic floor muscles to demonstrate functional recruitment and motion and allow for further strengthening.    Baseline Pt. reports inability to sense the PFM for kegel or relaxation, having mixed UI    Time 5    Period Weeks    Status Achieved    Target Date 08/20/19             PT Long Term Goals -  06/13/20 0904      PT LONG TERM GOAL #1   Title Patient will report no episodes of UUI/SUI over the course of the prior two weeks to demonstrate improved functional ability.    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 As of 4/30: She had been doing better but then took a step backward when she was sick and vomiting a lot. As of 7/9: Pt. continues to have decreased incontinence but had an episode of total bladder loss and UUI ~ 1-2 times/day still (worsened due to overuse/spasm of LB musculature.    Time 10    Period Weeks    Status On-going    Target Date 08/22/20      PT LONG TERM GOAL #2   Title Patient will report no pain with intercourse to demonstrate improved functional ability.    Baseline having mild pain with initial penetration As of 1:29: had some pain at initial penetration and with deeper thrusting at ~ 3-4/10 but "not terrible"    Time 10    Period Weeks    Status On-going    Target Date 08/22/20      PT LONG TERM GOAL #3   Title Patient will score at or below 22/100 on the UDI and 27/100 on the UIQ to demonstrate a clinically meaningful decrease in disability and distress due to pelvic floor dysfunction.    Baseline UDI: 67, UIQ:  72/100 10-19-19: UDI 55.5, UIQ 76/100; POPDI 11/100 As of 1/29: UDI:33/100, UIQ: 6/100, POPDI:6/100. As of 4/30: Due to set-back from    Time 10    Period Weeks    Status On-going    Target Date 08/22/20      PT LONG TERM GOAL #4   Title Pt. 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    Baseline Pt. requiring RW for AMB due to L foot-drop and imbalance. As of 10/12: Pt. requiring some support (e.g. grocery cart) for > 20 min. of AMB or stairs.    Time 10    Period Weeks    Status Achieved    Target Date 11/25/20                 Plan - 10/10/20 1431    Clinical Impression Statement Pt. Responded well to all interventions today, demonstrating improved scapular retraction and depression recruitment and decreased spasm and TTP, improved timing and coordination of deep-core with tall-kneeling exercises, as well as understanding and correct performance of all education and exercises provided today. They will continue to benefit from skilled physical therapy to work toward remaining goals and maximize function as well as decrease likelihood of symptom increase or recurrence.    PT Next Visit Plan RE-ASSESS GOALS AND SEND RE-CERT!! TPDN to R posterior scapular stabilizers and spinal mobilization/ rotational mobs into R rotation and perform PNF for the scapula and rainbows on the R.re-assess rib mobility and obliques/diaphragm, re-assess internally, review deep-core coordination (tall kneel, knee step through, stepping, lean-backs) stabilize gait pattern; Internal TP release to scar at posterior fourchette, treat spinal mobility to improve diaphragmcoordination and reduce L LE radiating symptoms; TP release vs. DN to, hip-flexors, and R pectineus. re-assess for Piriformis spasm (have performed DN and TP release now); check in with long term HEP performance  PT Home Exercise Plan A day:  Teap[place], Side-planks, Planks, Dorothy's, scapular rows and I's, Y's, T's, and W's (temp. rows,sacp retractions, and stretch levator scap on R. Door stretch,B [id number] with leg lift Child's pose V-sit/ single leg hamstring stretch, side-lying hip ABD. Side stretch, modified bridge, Piriformis stretch, frog stretch Every day: Kegels 3-5x/day with long-hold and pelvic tilt with sneeze, laugh/cough kegel, vaginal weights, tall kneeling, tall-kneeling step-through, lean-backs, Corrective: Upper cervical rotationTennis ball releaseadded MET for R anterior for 1 week.    Consulted and Agree with Plan of Care Patient           Patient will benefit from skilled therapeutic intervention in order to improve the following deficits and impairments:     Visit Diagnosis: Other muscle spasm  Foot drop, left  Sacrococcygeal disorders, not elsewhere classified     Problem List Patient Active Problem List   Diagnosis Date Noted  . Knee pain 09/17/2020  . Urinary incontinence 08/17/2019  . Foot drop, left 08/17/2019  . Maternal varicella, non-immune 11/20/2018   Willa Rough DPT, ATC Willa Rough 10/10/2020, 2:33 PM  [place] MAIN [facility] SERVICES [address] [place], [place], [address] Phone: [phone]   Fax:  [phone]  Name: Lisa Brady MRN: [id number] Date of Birth: 11/03/1986

## 2020-10-10 NOTE — Patient Instructions (Addendum)
Activate and draw up the pelvic floor up and and tuck pelvis slightly under by squeezing the lower tummy muscles and glutes. Shift your weight to the still leg and stabilize as you bring your other leg forward and then back. Hold kegel through the motion, resting in-between sets. Repeat _2x10__ times, __1__ times per day. ( half with each leg forward)    Make sure the hips are both pointed forward and that the knee is not forward past the toes. Activate and draw up the pelvic floor and tuck pelvis slightly under by squeezing the lower tummy muscles and glutes. Hold up to 10 seconds, resting when the PF becomes tired. Repeat ___ times, ____ times per day.     Make sure the hips are both pointed forward and that the knee is not forward past the toes. Activate and draw up the pelvic floor and tuck pelvis slightly under by squeezing the lower tummy muscles and glutes. Finally, slowly swing arms in an alternating fashion to challenge your balance. Hold up to 10 seconds, resting when the PF becomes tired. Repeat _10__ times, __1__ times per day.  A day: Teapot, Side-planks, Planks, Dorothy's, scapular rows and I's, Y's, T's, and W's (temp. rows,sacp retractions, and stretch levator scap on R. Door stretch, B [id number] with leg lift Child's pose V-sit/ single leg hamstring stretch, side-lying hip ABD. Side stretch, modified bridge, Piriformis stretch, frog stretch  Every day: Kegels 3-5x/day with long-hold and pelvic tilt with sneeze, laugh/cough kegel, vaginal weights, tall kneeling (one set of Kegel's, tall-kneeling step-through, lean-backs   Corrective: Upper cervical rotationTennis ball releaseadded MET for R anterior for 1 week.

## 2020-10-17 ENCOUNTER — Other Ambulatory Visit: Payer: Self-pay

## 2020-10-17 ENCOUNTER — Ambulatory Visit: Payer: No Typology Code available for payment source

## 2020-10-17 DIAGNOSIS — M533 Sacrococcygeal disorders, not elsewhere classified: Secondary | ICD-10-CM

## 2020-10-17 DIAGNOSIS — M21372 Foot drop, left foot: Secondary | ICD-10-CM

## 2020-10-17 DIAGNOSIS — M62838 Other muscle spasm: Secondary | ICD-10-CM | POA: Diagnosis not present

## 2020-10-17 NOTE — Patient Instructions (Signed)
Hip Extension:    Lying face down, raise leg just off floor squeezing glutes. Keep leg straight. Hold 1 count. Lower leg to floor. Do _10__ reps per set, __3_ sets for each leg.  Optional: Place small pillow under abdomen if back becomes uncomfortable.

## 2020-10-17 NOTE — Therapy (Signed)
[place] MAIN [facility] SERVICES [address] [place], [place], [address] Phone: [phone]   Fax:  [phone]  Physical Therapy Treatment  The patient has been informed of current processes in place at [facility]patient [facility] to protect patients from Covid-19 exposure including social distancing, schedule modifications, and new cleaning procedures. After discussing their particular risk with a therapist based on the patient's personal risk factors, the patient has decided to proceed with in-person therapy.   Patient Details  Name: Lisa Brady MRN: [id number] Date of Birth: [birth year] No data recorded  Encounter Date: 10/17/2020   PT End of Session - 10/17/20 1012    Visit Number 89    Number of Visits 91    Date for PT Re-Evaluation 08/22/20    Authorization Type [employer]    Authorization - Visit Number 10    Authorization - Number of Visits 10    Progress Note Due on Visit 10    PT Start Time 0730    PT Stop Time 0830    PT Time Calculation (min) 60 min    Activity Tolerance Patient tolerated treatment well;No increased pain    Behavior During Therapy WFL for tasks assessed/performed           Past Medical History:  Diagnosis Date  . Foot drop     Past Surgical History:  Procedure Laterality Date  . WISDOM TOOTH EXTRACTION  2006    There were no vitals filed for this visit.  Pelvic Floor Physical Therapy Treatment Note  SCREENING  Changes in medications, allergies, or medical history?: none    SUBJECTIVE  Patient reports: Rough week, had an accident while brushing her teeth over the weekend while brushing her teeth. Had one "sneeze-pee" on Wednesday night.   Precautions:  14 months PP  Pain update:  Location of pain:  Low back hip/side (shoulders R shoulder) Current pain:  1/10 (0/10 with turning) Max pain:  4/10  (3/10) Least pain:  0/10 (0/10) Nature of pain: tightness/achy  **no increased pain  following treatment   Patient Goals: Control her bladder/not need depends.   OBJECTIVE  Changes in:   Observation:  R ASIS low, PSIS level. L pubic ramus low (not re-assessed today)  R scapula protracted and winging>L   ROM/Mobility:  Decreased scapular depression and downward rotation ROM in R>L scapula. Decreased L cervical rotation with painful end-feel. Decreased mobility at pubic symphysis and pain with pressure at L Pubic ramus  (from prior session)  TODAY: Pt demonstrates decreased upward rotation ROM for R scapula due to spasm/ fascial tightness that is limiting proprioception and recruitment for effective downward scapular rotation strengthening.   Strength:  Muscle fatigue reached earlier on R>L with tall-kneeling/balance exercises. Imbalance surrounding R scapula not allowing Pt. To perform I's, Y's, T's W's well. (from prior session)  MMT LE  - L DF 4+/5 (different rater may account for difference but could be due to spasms in back since test performed prior to DN/manual) - R DF 5/5 (from prior session)  Pelvic floor: TTP through R posterior PR/PC and OI as well as 5 o'clock region of posterior fourchette. TTP to to L posterior fourchette at ~ 5 o'clock. Coordinated and able to keep probe in even at ~ 45 degrees. Increased force production when TA and obliques recruited. ~ 3+/5 strength and maximal endurance at ~ 8 seconds.  (from prior session)  TODAY: 3-/5 strength pre-treatment, 3/5 post-treatment. TTP through all PFM with radiating Sx.  To LB, into LLE, and across the lower abdomen.    Palpation:    Gait Analysis: (from prior session) Gait speed: 1.12ms ; anterior pelvic tilt, knee hyperextension, heel strike - with cues pelvic neutral achieved, with appropriate arm swing and decreased heel strike.     INTERVENTIONS THIS SESSION: Manual: Performed TP release to L posterior and anterior PR/PC, coccygeus, and through posterior fourchette between PFM layers and  near scar at ~ 6:30 to decrease spasm and pain and allow for improved balance of musculature for improved function and decreased symptoms.  Self-care: Re-assessed goals and POC and concluded that the LB spasms and glute weakness were likely the culprit for a decline in PFM spasms and weakness.  Therex: educated on prone hip EXT with hips over pillow and emphasis on not letting the LB muscles compensate to strengthen glutes and allow for improved relaxation of the deep hip ER's and LB extensors to help stabilize the pelvis and prevent return of PFM spasms.    Total time: 60 min.                               PT Short Term Goals - 06/13/20 0904      PT SHORT TERM GOAL #1   Title Patient will demonstrate improved pelvic alignment and balance of musculature surrounding the pelvis to facilitate decreased PFM spasms and decrease pressure on nerve roots to allow for optimal PFM function    Baseline Has L foot-drop and pain in L glutes/knee as well as inability to sense PFM and L posterior innominate rotation/spasms surrounding pelvis.    Time 5    Period Weeks    Status Achieved    Target Date 11/26/19      PT SHORT TERM GOAL #2   Title Patient will demonstrate HEP x1 in the clinic to demonstrate understanding and proper form to allow for further improvement.    Baseline Pt. lacks knowledge of therapeutic ecersise that will decrease Sx.    Time 5    Period Weeks    Status Achieved    Target Date 11/26/19      PT SHORT TERM GOAL #3   Title Pt. will be able to walk 250 Ft.with CGA and w/o assistive device to demonstrate improved balance/decreased instability and foot drop.    Baseline Pt. using RW to AMB due to L foot-drop and increased instability.    Time 5    Period Weeks    Status Achieved    Target Date 08/20/19      PT SHORT TERM GOAL #4   Title Patient will demonstrate a coordinated contraction, relaxation, and bulge of the pelvic floor muscles to  demonstrate functional recruitment and motion and allow for further strengthening.    Baseline Pt. reports inability to sense the PFM for kegel or relaxation, having mixed UI    Time 5    Period Weeks    Status Achieved    Target Date 08/20/19             PT Long Term Goals - 10/17/20 0740      PT LONG TERM GOAL #1   Title Patient will report no episodes of UUI/SUI over the course of the prior two weeks to demonstrate improved functional ability.    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  As of 4/30: She had been doing better but then took a step backward when she was sick and vomiting a lot. As of 7/9: Pt. continues to have decreased incontinence but had an episode of total bladder loss and UUI ~ 1-2 times/day still (worsened due to overuse/spasm of LB musculature. As of 11/12: She had one "big accident" and one "snexe-pee" accident over the week.    Time 10    Period Weeks    Status On-going    Target Date 12/26/20      PT LONG TERM GOAL #2   Title Patient will report no pain with intercourse to demonstrate improved functional ability.    Baseline having mild pain with initial penetration As of 1:29: had some pain at initial penetration and with deeper thrusting at ~ 3-4/10 but "not terrible" 10/17/20: has not attempted to know    Time 10    Period Weeks    Status On-going    Target Date 12/26/20      PT LONG TERM GOAL #3   Title Patient will score at or below 22/100 on the UDI and 27/100 on the UIQ to demonstrate a clinically meaningful decrease in disability and distress due to pelvic floor dysfunction.    Baseline UDI: 67, UIQ: 72/100 10-19-19: UDI 55.5, UIQ 76/100; POPDI 11/100 As of 1/29: UDI:33/100, UIQ: 6/100, POPDI:6/100. As of 4/30: Due to set-back from vomiting. As of 11/12: UDI: 17/100, UIQ: POPDI: 11/100    Time 10    Period Weeks    Status Achieved    Target Date  08/22/20      PT LONG TERM GOAL #4   Title Pt. 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    Baseline Pt. requiring RW for AMB due to L foot-drop and imbalance. As of 10/12: Pt. requiring some support (e.g. grocery cart) for > 20 min. of AMB or stairs.    Time 10    Period Weeks    Status Achieved    Target Date 11/25/20      PT LONG TERM GOAL #5   Title Pt. will improve in FOTO score by 10 points to demonstrate improved function.    Baseline FOTO PFDI Urinary: 29, Urinary Problem:54    Time 10    Period Weeks    Status New    Target Date 12/26/20      Additional Long Term Goals   Additional Long Term Goals Yes      PT LONG TERM GOAL #6   Title Patient will demonstrate 4+/5 strength or better in the PF and surrounding musculature to show improved functional strength for childcare and other vocational and recreational activities.    Baseline PFM 3/5    Time 10    Period Weeks    Status New    Target Date 12/26/20                 Plan - 10/17/20 1012    Clinical Impression Statement Pt. has continued to make progress toward goals and has met initial functional outcome measure goals today but continues to demonstrate significant variability in how well she is able to retain urine and is still having urinary urge incontinence though with less frequency. Today with PFM re-assessment it became evident that though pt. is not complaining of high pain in the LB, there is restriction in the SIJ region  that has contributed to a return of PFM spasms and decreased ability to recruit. She will continue to benefit from skilled pelvic health PT to continue to focus on strengthening proximal stabilizers and take pressure off of nerve roots to allow for improved function of the PFM and prevent return of spasms and pain. The patient's course has been complicated by her co-morbidities and  set-backs that have occurred any time pt. has become ill and vomiting but she is expected to continue to make progress. New functional goals were established today using FOTO and strength measures to improve focus of therapy.    Personal Factors and Comorbidities Comorbidity 2    Comorbidities scoliosis, hypermobility    Examination-Activity Limitations Locomotion Level;Stand;Stairs;Lift;Squat;Toileting;Continence;Sit;Transfers;Caring for Others;Bend    Examination-Participation Restrictions Interpersonal Relationship;Yard Work;Community Activity;Meal Prep;Laundry    Stability/Clinical Decision Making Evolving/Moderate complexity    Clinical Decision Making Moderate    Rehab Potential Good    PT Frequency 1x / week    PT Duration Other (comment)    PT Treatment/Interventions ADLs/Self Care Home Management;Aquatic Therapy;Biofeedback;Electrical Stimulation;Gait training;Stair training;Balance training;Therapeutic exercise;Therapeutic activities;Functional mobility training;Neuromuscular re-education;Patient/family education;Manual techniques;Scar mobilization;Dry needling;Taping;Spinal Manipulations;Joint Manipulations    PT Next Visit Plan test hip strength!!!!!!!!!!!! TPDN to LB, re-assess PFM, TPDN to R posterior scapular stabilizers and spinal mobilization/ rotational mobs into R rotation and perform PNF for the scapula and rainbows on the R.re-assess rib mobility and obliques/diaphragm, re-assess internally, review deep-core coordination (tall kneel, knee step through, stepping, lean-backs) stabilize gait pattern; Internal TP release to scar at posterior fourchette, treat spinal mobility to improve diaphragmcoordination and reduce L LE radiating symptoms; TP release vs. DN to, hip-flexors, and R pectineus. re-assess for Piriformis spasm (have performed DN and TP release now); check in with long term HEP performance    PT Home Exercise Plan A day: [place], Side-planks, Planks, Dorothy's, scapular rows  and I's, Y's, T's, and W's (temp. rows,sacp retractions, and stretch levator scap on R. Door stretch,B [id number] with leg lift Child's pose V-sit/ single leg hamstring stretch, side-lying hip ABD and prone hip EXT. Side stretch, modified bridge, Piriformis stretch, frog stretch Every day: Kegels 3-5x/day with long-hold and pelvic tilt with sneeze, laugh/cough kegel, vaginal weights, tall kneeling, tall-kneeling step-through, lean-backs, Corrective: Upper cervical rotationTennis ball releaseadded MET for R anterior for 1 week.    Consulted and Agree with Plan of Care Patient           Patient will benefit from skilled therapeutic intervention in order to improve the following deficits and impairments:  Abnormal gait, Decreased balance, Decreased endurance, Difficulty walking, Increased muscle spasms, Impaired tone, Improper body mechanics, Decreased activity tolerance, Decreased coordination, Decreased strength, Hypermobility, Postural dysfunction, Pain  Visit Diagnosis: Other muscle spasm  Foot drop, left  Sacrococcygeal disorders, not elsewhere classified     Problem List Patient Active Problem List   Diagnosis Date Noted  . Knee pain 09/17/2020  . Urinary incontinence 08/17/2019  . Foot drop, left 08/17/2019  . Maternal varicella, non-immune 11/20/2018   Willa Rough DPT, ATC Willa Rough 10/17/2020, 10:39 AM  [place] [facility] SERVICES [address] [place], [place], [address] Phone: [phone]   Fax:  [phone]  Name: Tashana Haberl MRN: [id number] Date of Birth: August 22, 1986

## 2020-11-07 ENCOUNTER — Ambulatory Visit: Payer: No Typology Code available for payment source

## 2020-11-21 ENCOUNTER — Ambulatory Visit: Payer: No Typology Code available for payment source | Attending: Obstetrics and Gynecology

## 2020-11-21 ENCOUNTER — Other Ambulatory Visit: Payer: Self-pay

## 2020-11-21 DIAGNOSIS — M21372 Foot drop, left foot: Secondary | ICD-10-CM | POA: Diagnosis present

## 2020-11-21 DIAGNOSIS — M62838 Other muscle spasm: Secondary | ICD-10-CM | POA: Diagnosis present

## 2020-11-21 DIAGNOSIS — M533 Sacrococcygeal disorders, not elsewhere classified: Secondary | ICD-10-CM | POA: Diagnosis present

## 2020-11-21 NOTE — Therapy (Addendum)
[place] MAIN [facility] SERVICES [address] [place], [place], [address] Phone: [phone]   Fax:  [phone]  Physical Therapy Treatment  The patient has been informed of current processes in place at Outpatient Rehab to protect patients from Covid-19 exposure including social distancing, schedule modifications, and new cleaning procedures. After discussing their particular risk with a therapist based on the patient's personal risk factors, the patient has decided to proceed with in-person therapy.  Patient Details  Name: Lisa Brady MRN: [id number] Date of Birth: 08-05-1986 No data recorded  Encounter Date: 11/21/2020   PT End of Session - 11/25/20 1255    Visit Number 69    Number of Visits 77    Date for PT Re-Evaluation 12/26/20    Authorization Type [place]    Authorization - Visit Number 1    Authorization - Number of Visits 10    Progress Note Due on Visit 10    PT Start Time 0730    PT Stop Time 0830    PT Time Calculation (min) 60 min    Activity Tolerance Patient tolerated treatment well;No increased pain    Behavior During Therapy WFL for tasks assessed/performed           Past Medical History:  Diagnosis Date  . Foot drop     Past Surgical History:  Procedure Laterality Date  . WISDOM TOOTH EXTRACTION  2006    There were no vitals filed for this visit.  Pelvic Floor Physical Therapy Treatment Note  SCREENING  Changes in medications, allergies, or medical history?: none    SUBJECTIVE  Patient reports: She has been traveling and not getting her HEP done. She was having issues feeling her Kegel's before then anyway. Feels like she is "falling apart". Feels like her inner thigh muscles are limiting her still. Shoulder is not perfect but has not bothered her much. Her neck has been a little bothersome. Her low back pas been a little sore occasionally, probably a little less than before but it is a little  lower (QL region). Her abs feel weaker. Had been really consistent with her PT up until ~ 2 weeks ago. Feels like her planks got harder. Side plank still feels normal.   Precautions:  14 months PP  Pain update:  Location of pain:  Low back hip/side (neck) Current pain:  0/10 (0/10 with turning) Max pain:  4/10  (3/10) Least pain:  0/10 (0/10) Nature of pain: tightness/achy  **no increased pain following treatment   Patient Goals: Control her bladder/not need depends.   OBJECTIVE  Changes in:   Observation:  R anterior innominate rotation, L up-slip  **ASIS and PSIS appear level following treatment.  R scapula protracted and winging>L   ROM/Mobility:  Decreased scapular depression and downward rotation ROM in R>L scapula. Decreased L cervical rotation with painful end-feel. Decreased mobility at pubic symphysis and pain with pressure at L Pubic ramus  (from prior session)  Pt demonstrates decreased upward rotation ROM for R scapula due to spasm/ fascial tightness that is limiting proprioception and recruitment for effective downward scapular rotation strengthening. (from previous session)   Strength:  Muscle fatigue reached earlier on R>L with tall-kneeling/balance exercises. Imbalance surrounding R scapula not allowing Pt. To perform I's, Y's, T's W's well. (from prior session)  MMT LE  - L DF 4+/5 (different rater may account for difference but could be due to spasms in back since test performed prior to DN/manual) -  R DF 5/5 (from prior session)  Pelvic floor: TTP through R posterior PR/PC and OI as well as 5 o'clock region of posterior fourchette. TTP to to L posterior fourchette at ~ 5 o'clock. Coordinated and able to keep probe in even at ~ 45 degrees. Increased force production when TA and obliques recruited. ~ 3+/5 strength and maximal endurance at ~ 8 seconds.  (from prior session)  TODAY: not-reassessed today   Palpation:  Greatest tenderness with pressure  on spinous processes T5 and L2.  TTP to  B QL and L3/4 multifidus as well as R pectineus, iliacus and Psoas  Gait Analysis: (from prior session) Gait speed: 1.44ms ; anterior pelvic tilt, knee hyperextension, heel strike - with cues pelvic neutral achieved, with appropriate arm swing and decreased heel strike.     INTERVENTIONS THIS SESSION: Manual: Performed TP release to B QL and L3/4 multifidus as well as R pectineus, iliacus and Psoas distally follwed by MET correction for R anterior rotation x 2 and L up-slip correction to decrease spasm and pain and allow for improved balance of musculature for improved function and decreased symptoms.  Total time: 60 min.                      Trigger Point Dry Needling - 11/25/20 0001    Consent Given? Yes    Education Handout Provided No    Muscles Treated Lower Quadrant Adductor longus/brevis/magnus    Muscles Treated Back/Hip Iliopsoas;Lumbar multifidi;Quadratus lumborum    Adductor Response Twitch response elicited;Palpable increased muscle length    Lumbar multifidi Response Twitch response elicited;Palpable increased muscle length    Iliopsoas Response Twitch response elicited;Palpable increased muscle length                  PT Short Term Goals - 06/13/20 0904      PT SHORT TERM GOAL #1   Title Patient will demonstrate improved pelvic alignment and balance of musculature surrounding the pelvis to facilitate decreased PFM spasms and decrease pressure on nerve roots to allow for optimal PFM function    Baseline Has L foot-drop and pain in L glutes/knee as well as inability to sense PFM and L posterior innominate rotation/spasms surrounding pelvis.    Time 5    Period Weeks    Status Achieved    Target Date 11/26/19      PT SHORT TERM GOAL #2   Title Patient will demonstrate HEP x1 in the clinic to demonstrate understanding and proper form to allow for further improvement.    Baseline Pt. lacks knowledge of  therapeutic ecersise that will decrease Sx.    Time 5    Period Weeks    Status Achieved    Target Date 11/26/19      PT SHORT TERM GOAL #3   Title Pt. will be able to walk 250 Ft.with CGA and w/o assistive device to demonstrate improved balance/decreased instability and foot drop.    Baseline Pt. using RW to AMB due to L foot-drop and increased instability.    Time 5    Period Weeks    Status Achieved    Target Date 08/20/19      PT SHORT TERM GOAL #4   Title Patient will demonstrate a coordinated contraction, relaxation, and bulge of the pelvic floor muscles to demonstrate functional recruitment and motion and allow for further strengthening.    Baseline Pt. reports inability to sense the PFM for kegel or relaxation, having mixed  UI    Time 5    Period Weeks    Status Achieved    Target Date 08/20/19             PT Long Term Goals - 10/17/20 0740      PT LONG TERM GOAL #1   Title Patient will report no episodes of UUI/SUI over the course of the prior two weeks to demonstrate improved functional ability.    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 As of 4/30: She had been doing better but then took a step backward when she was sick and vomiting a lot. As of 7/9: Pt. continues to have decreased incontinence but had an episode of total bladder loss and UUI ~ 1-2 times/day still (worsened due to overuse/spasm of LB musculature. As of 11/12: She had one "big accident" and one "snexe-pee" accident over the week.    Time 10    Period Weeks    Status On-going    Target Date 12/26/20      PT LONG TERM GOAL #2   Title Patient will report no pain with intercourse to demonstrate improved functional ability.    Baseline having mild pain with initial penetration As of 1:29: had some pain at initial penetration and with deeper thrusting at ~ 3-4/10 but "not terrible" 10/17/20: has not attempted  to know    Time 10    Period Weeks    Status On-going    Target Date 12/26/20      PT LONG TERM GOAL #3   Title Patient will score at or below 22/100 on the UDI and 27/100 on the UIQ to demonstrate a clinically meaningful decrease in disability and distress due to pelvic floor dysfunction.    Baseline UDI: 67, UIQ: 72/100 10-19-19: UDI 55.5, UIQ 76/100; POPDI 11/100 As of 1/29: UDI:33/100, UIQ: 6/100, POPDI:6/100. As of 4/30: Due to set-back from vomiting. As of 11/12: UDI: 17/100, UIQ: POPDI: 11/100    Time 10    Period Weeks    Status Achieved    Target Date 08/22/20      PT LONG TERM GOAL #4   Title Pt. 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    Baseline Pt. requiring RW for AMB due to L foot-drop and imbalance. As of 10/12: Pt. requiring some support (e.g. grocery cart) for > 20 min. of AMB or stairs.    Time 10    Period Weeks    Status Achieved    Target Date 11/25/20      PT LONG TERM GOAL #5   Title Pt. will improve in FOTO score by 10 points to demonstrate improved function.    Baseline FOTO PFDI Urinary: 29, Urinary Problem:54    Time 10    Period Weeks    Status New    Target Date 12/26/20      Additional Long Term Goals   Additional Long Term Goals Yes      PT LONG TERM GOAL #6   Title Patient will demonstrate 4+/5 strength or better in the PF and surrounding musculature to show improved functional strength for childcare and other vocational and recreational activities.    Baseline PFM 3/5    Time 10    Period Weeks    Status  New    Target Date 12/26/20                 Plan - 11/25/20 1256    Clinical Impression Statement Pt. Responded well to all interventions today, demonstrating decreased spasm and pain, improved pelvic alignment, as well as understanding and correct performance of all education and exercises provided today. They will  continue to benefit from skilled physical therapy to work toward remaining goals and maximize function as well as decrease likelihood of symptom increase or recurrence.    PT Next Visit Plan test hip strength!!!!!!!!!!!! TPDN to LB, re-assess PFM, TPDN to R posterior scapular stabilizers and spinal mobilization/ rotational mobs into R rotation and perform PNF for the scapula and rainbows on the R.re-assess rib mobility and obliques/diaphragm, re-assess internally, review deep-core coordination (tall kneel, knee step through, stepping, lean-backs) stabilize gait pattern; Internal TP release to scar at posterior fourchette, treat spinal mobility to improve diaphragmcoordination and reduce L LE radiating symptoms; TP release vs. DN to, hip-flexors, and R pectineus. re-assess for Piriformis spasm (have performed DN and TP release now); check in with long term HEP performance    PT Home Exercise Plan A day: [place], Side-planks, Planks, Dorothy's, scapular rows and I's, Y's, T's, and W's (temp. rows,sacp retractions, and stretch levator scap on R. Door stretch,B [id number] with leg lift Child's pose V-sit/ single leg hamstring stretch, side-lying hip ABD and prone hip EXT. Side stretch, modified bridge, Piriformis stretch, frog stretch Every day: Kegels 3-5x/day with long-hold and pelvic tilt with sneeze, laugh/cough kegel, vaginal weights, tall kneeling, tall-kneeling step-through, lean-backs, Corrective: Upper cervical rotationTennis ball releaseadded MET for R anterior for 1 week.    Consulted and Agree with Plan of Care Patient           Patient will benefit from skilled therapeutic intervention in order to improve the following deficits and impairments:     Visit Diagnosis: Other muscle spasm  Foot drop, left  Sacrococcygeal disorders, not elsewhere classified     Problem List Patient Active Problem List   Diagnosis Date Noted  . Knee pain 09/17/2020  . Urinary incontinence 08/17/2019   . Foot drop, left 08/17/2019  . Maternal varicella, non-immune 11/20/2018   Willa Rough DPT, ATC Willa Rough 11/25/2020, 12:56 PM  [place] MAIN [facility] SERVICES [address] [place], [place], [address] Phone: [phone]   Fax:  [phone]  Name: Lisa Brady MRN: [id number] Date of Birth: 1986/11/24

## 2020-12-05 IMAGING — US OBSTETRIC 14+ WK ULTRASOUND
2 series · 13 of 28 positions shown · non-contrast
Comparison: none

CLINICAL DATA: Post-dates pregnancy. Current assigned gestational
age of 40 weeks 4 days. Evaluate fetal growth and BPP.

EXAM:
OBSTETRICAL ULTRASOUND >14 WKS AND BIOPHYSICAL PROFILE

[Series 1: obstetric 14+ wk ultrasound · 0.26mm/px · 5 of 20 slices shown (1 of 2)]
[im 2/20]
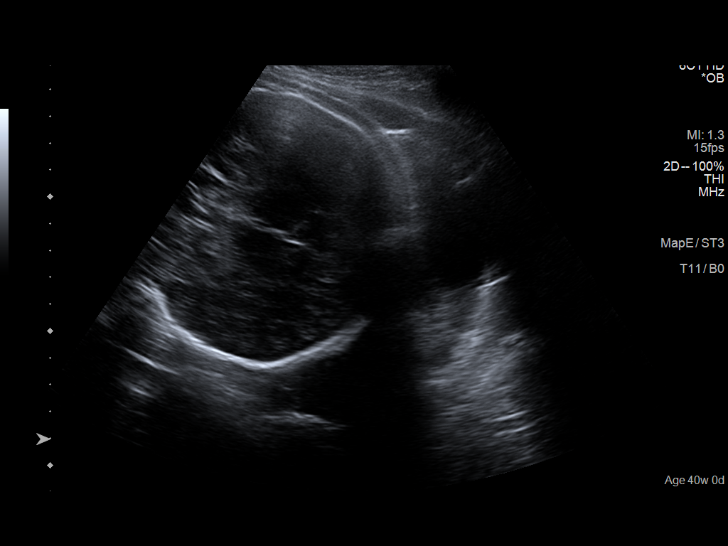
[im 6/20]
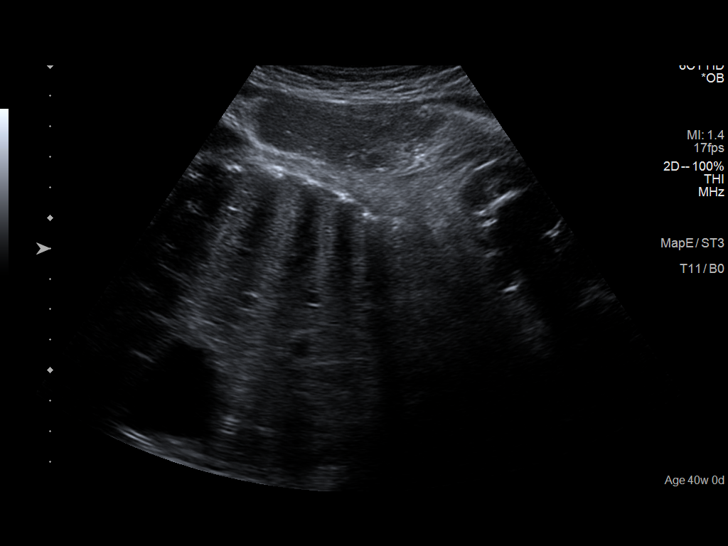
[im 10/20]
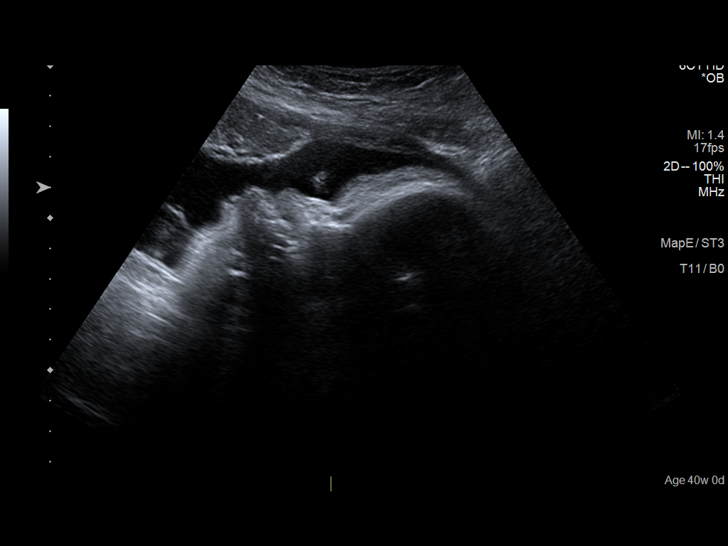
[im 14/20]
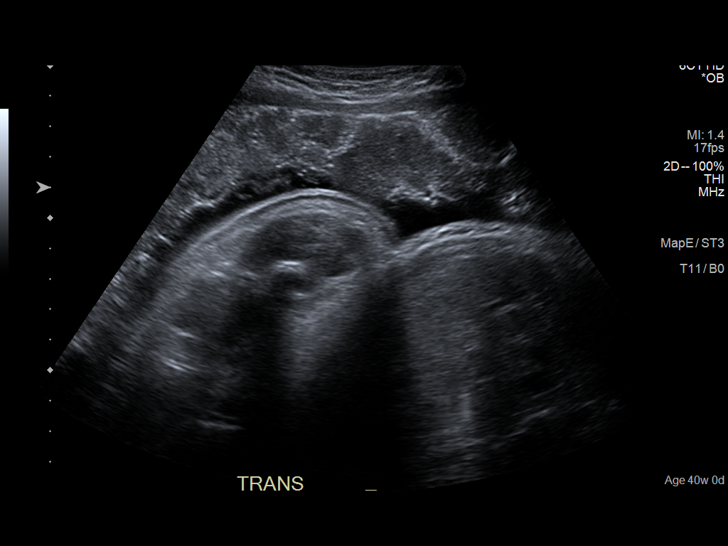
[im 18/20]
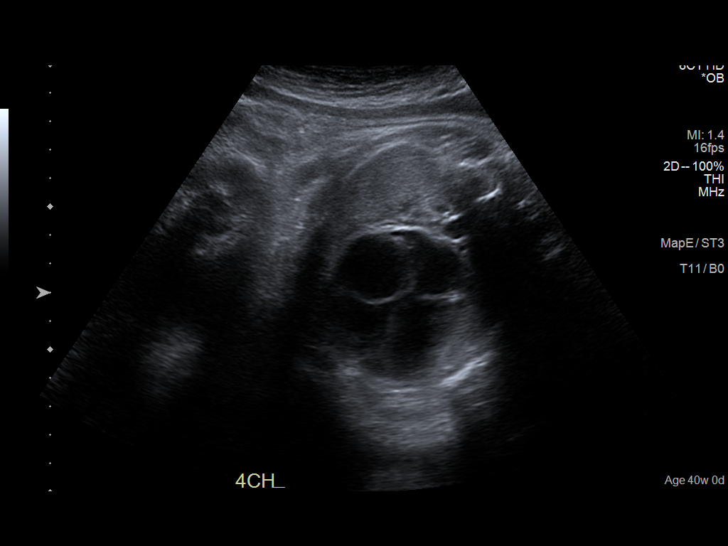

[Series 1001: obstetric 14+ wk ultrasound · 0.23mm/px · 8 of 32 slices shown (2 of 2)]
[im 1/32]
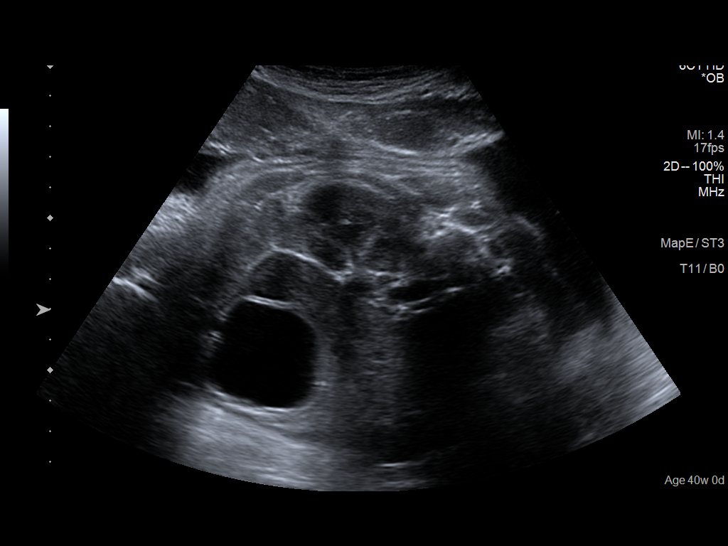
[im 6/32]
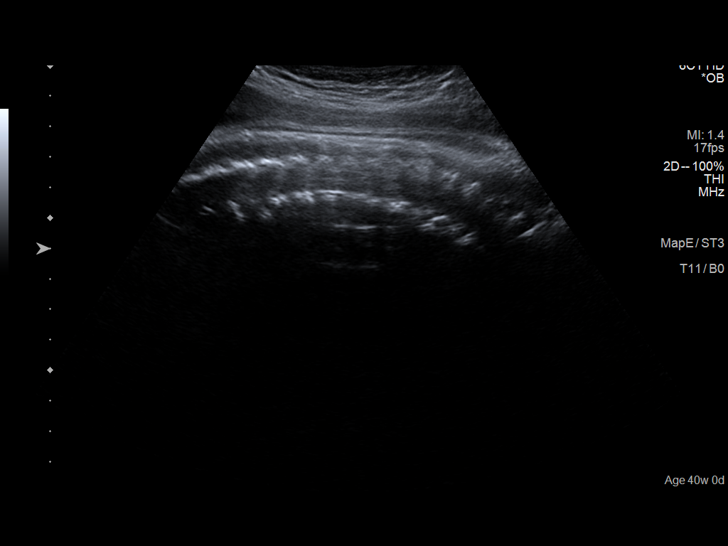
[im 10/32]
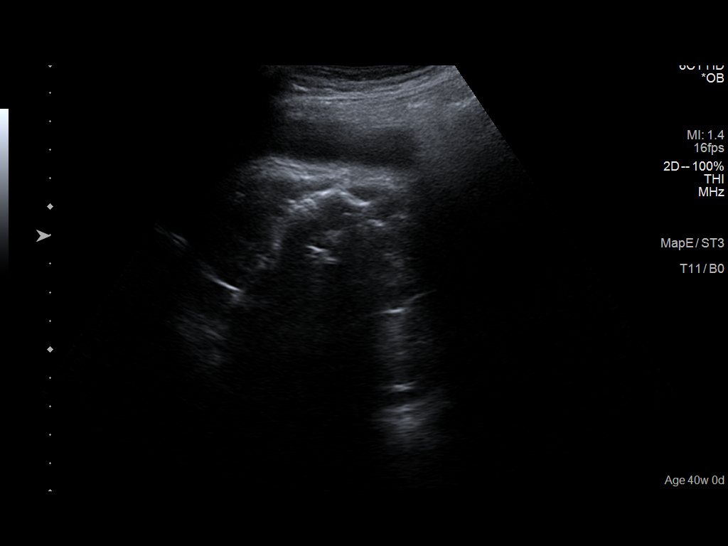
[im 14/32]
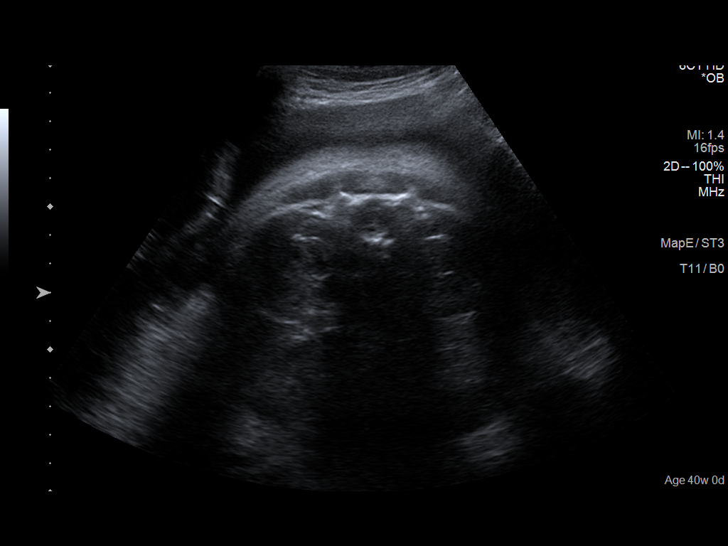
[im 18/32]
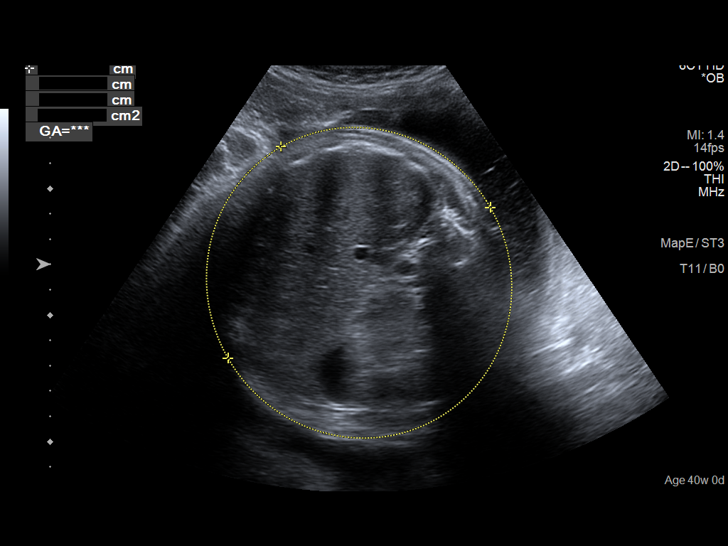
[im 22/32]
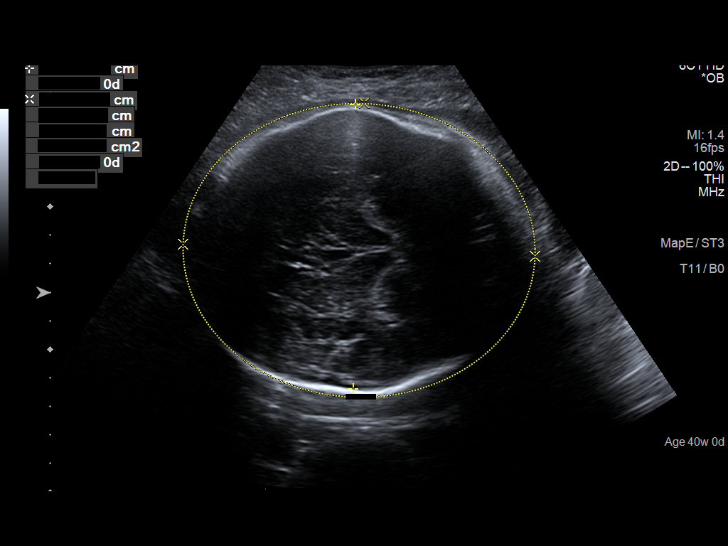
[im 26/32]
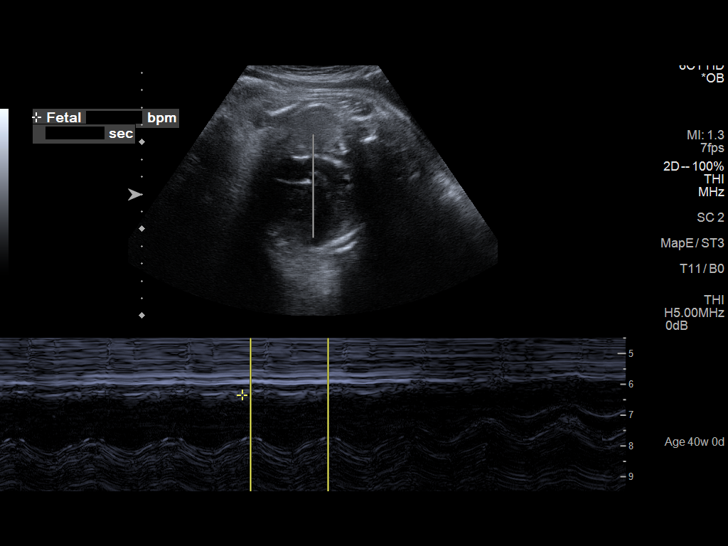
[im 30/32]
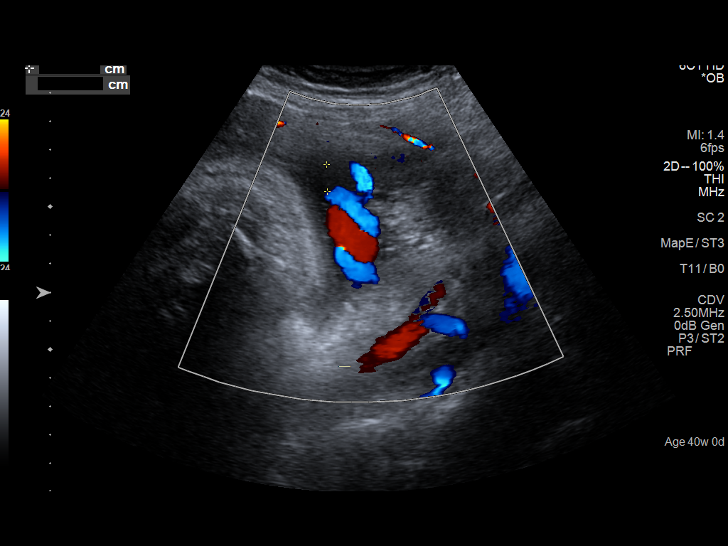

[13 of 28 positions shown; findings below may reference images not displayed]

FINDINGS: Number of Fetuses: 1

Heart Rate:  132 bpm

Movement: Yes

Presentation: Cephalic

Previa: No

Placental Location: Anterior

Amniotic Fluid (Subjective): Within normal limits

Amniotic Fluid (Objective):

AFI 14.0 cm (5%ile= 7.1 cm, 95%= 21.4 cm for 40 wks)

FETAL BIOMETRY

BPD:  10.0cm 41w 0d

HC:    35.7cm 42w 0d

AC:   38.0cm 42w 0d

FL:   7.7cm 39w 4d

Current Mean GA: 40w 6d US EDC: 06/23/2019

Assigned GA: 40w 4d Assigned EDC: 06/29/2019

Estimated Fetal Weight:  4,387g 95%ile

FETAL ANATOMY

Lateral Ventricles: Appears normal

Thalami/CSP: Appears normal

Posterior Fossa: Not visualized

Nuchal Region: Not visualized    NFT= N/A > 20 WKS

Upper Lip: Appears normal

Spine: Appears normal

4 Chamber Heart on Left: Appears normal

LVOT: Not visualized

RVOT: Not visualized

Stomach on Left: Appears normal

3 Vessel Cord: Not visualized

Cord Insertion site: Not visualized

Kidneys: Appears normal

Bladder: Appears normal

Extremities: Not visualized

Sex: Male

Technical Limitations: Advanced gestational age and fetal position

Maternal Findings:

Cervix:  Not evaluated (>34 wks)

BIOPHYSICAL PROFILE

Movement: 2 time: 20 minutes

Breathing: 2

Tone:  2

Amniotic Fluid: 2

Total Score:  8
IMPRESSION: Current assigned gestational age of 40 weeks 4 days. Fetus measures
large for gestational age, with EFW at 95 %ile.

Amniotic fluid volume within normal limits, with AFI of 14.0 cm.

Biophysical profile score of [DATE].

## 2020-12-06 NOTE — L&D Delivery Note (Signed)
Delivery Summary for Lisa Brady  Labor Events:   Preterm labor: No data found  Rupture date: 10/16/2021  Rupture time: 12:41 PM  Rupture type: Artificial Intact  Fluid Color: Clear  Induction: No data found  Augmentation: No data found  Complications: No data found  Cervical ripening: No data found No data found   No data found     Delivery:   Episiotomy: No data found  Lacerations: No data found  Repair suture: No data found  Repair # of packets: No data found  Blood loss (ml): 1000   Information for the patient's newborn:  [name], [name] [[id number]]   Delivery 10/16/2021 2:53 PM by  Vaginal, Spontaneous Sex:  female Gestational Age: [redacted]w[redacted]d Delivery Clinician:   Living?:         APGARS  One minute Five minutes Ten minutes  Skin color:        Heart rate:        Grimace:        Muscle tone:        Breathing:        Totals: 8  9      Presentation/position:      Resuscitation:   Cord information:    Disposition of cord blood:     Blood gases sent?  Complications:   Placenta: Delivered:       appearance Newborn Measurements: Weight: 8 lb 13.5 oz (4010 g)  Height:    Head circumference:    Chest circumference:    Other providers:    Additional  information: Forceps:   Vacuum:   Breech:   Observed anomalies       Delivery Note At 2:53 PM a viable and healthy female was delivered via Vaginal, Spontaneous (Presentation: Right Occiput Anterior).  APGAR: 8, 9; weight 8 lb 13.5 oz (4010 g).   Placenta status: Spontaneous, Intact.  Cord: 3 vessels with the following complications: None.  Cord pH: not obtained.  Delayed cord clamping was observed. IV Pitocin bolus administered after delivery of the fetus.   Anesthesia: Epidural Episiotomy: None Lacerations: 2nd degree Suture Repair: 2.0 vicryl rapide Est. Blood Loss (mL): 1000.  800 mcg Cytotec administered rectally.  Mom to postpartum.  Baby to Couplet care / Skin to Skin.  [name],  MD 10/17/2021, 4:23 PM

## 2020-12-12 ENCOUNTER — Other Ambulatory Visit: Payer: Self-pay

## 2020-12-12 ENCOUNTER — Ambulatory Visit: Payer: No Typology Code available for payment source | Attending: Obstetrics and Gynecology

## 2020-12-12 DIAGNOSIS — M62838 Other muscle spasm: Secondary | ICD-10-CM | POA: Insufficient documentation

## 2020-12-12 DIAGNOSIS — M21372 Foot drop, left foot: Secondary | ICD-10-CM | POA: Diagnosis present

## 2020-12-12 DIAGNOSIS — M533 Sacrococcygeal disorders, not elsewhere classified: Secondary | ICD-10-CM | POA: Insufficient documentation

## 2020-12-12 NOTE — Patient Instructions (Addendum)
  Pelvic Rotation: Contract / Relax (Supine)  MET to Correct Right Anteriorly Rotated/Left Posteriorly Rotated Innominate   Begin laying on your back with your feet at 90 degrees. Put a dowel/broomstick  through your legs, behind your right knee and in front of your left knee. Stabilize the dowel on ether side with your hands.  Press down with the right leg and up with the left leg. Hold for 5 seconds  then slowly relax. Repeat 5 times.    

## 2020-12-12 NOTE — Therapy (Signed)
[place] MAIN [facility] SERVICES [address] [place], [place], [address] Phone: [phone]   Fax:  [phone]  Physical Therapy Treatment  The patient has been informed of current processes in place at Outpatient Rehab to protect patients from Covid-19 exposure including social distancing, schedule modifications, and new cleaning procedures. After discussing their particular risk with a therapist based on the patient's personal risk factors, the patient has decided to proceed with in-person therapy.  Patient Details  Name: Lisa Brady MRN: [id number] Date of Birth: 03/10/1986 No data recorded  Encounter Date: 12/12/2020   PT End of Session - 12/12/20 1155    Visit Number 55    Number of Visits 10    Date for PT Re-Evaluation 12/26/20    Authorization Type [employer]    Authorization - Visit Number 2    Authorization - Number of Visits 10    Progress Note Due on Visit 10    PT Start Time 0830    PT Stop Time 0930    PT Time Calculation (min) 60 min    Activity Tolerance Patient tolerated treatment well;No increased pain    Behavior During Therapy WFL for tasks assessed/performed           Past Medical History:  Diagnosis Date  . Foot drop     Past Surgical History:  Procedure Laterality Date  . WISDOM TOOTH EXTRACTION  2006    There were no vitals filed for this visit.  Pelvic Floor Physical Therapy Treatment Note  SCREENING  Changes in medications, allergies, or medical history?: none    SUBJECTIVE  Patient reports: One day she had a big accident when her bladder was very full and she was trying to pick up leland. Over the last 2 weeks she has been feeling a lot more urgency and has had leakage carrying the baby down stairs. Has had more urgency but no real leakage at work. She had a cough for a couple days too. It has still been really tough feeling kegels but she can feel it better when she is using the kegel  weight. Cand do 20 quick flicks and 10 long-holds and in standing sometimes she can keep it in for a little bit and other times it wants to fall right out. LB has been a little flared but not bad  Precautions:  14 months PP  Pain update:  Location of pain:  Low back hip/side (neck) Current pain:  0/10 (0/10 with turning) Max pain:  3/10  (2/10) Least pain:  0/10 (0/10) Nature of pain: tightness/achy  **no increased pain following treatment   Patient Goals: Control her bladder/not need depends.   OBJECTIVE  Changes in:   Observation:  Slight R anterior innominate rotation  R scapula protracted and winging>L (from prior session)  ROM/Mobility:  Decreased scapular depression and downward rotation ROM in R>L scapula. Decreased L cervical rotation with painful end-feel. Decreased mobility at pubic symphysis and pain with pressure at L Pubic ramus  (from prior session)  Pt demonstrates decreased upward rotation ROM for R scapula due to spasm/ fascial tightness that is limiting proprioception and recruitment for effective downward scapular rotation strengthening. (from previous session)   Strength:  Muscle fatigue reached earlier on R>L with tall-kneeling/balance exercises. Imbalance surrounding R scapula not allowing Pt. To perform I's, Y's, T's W's well. (from prior session)  MMT LE  - L DF 4+/5 (different rater may account for difference but could be due to  spasms in back since test performed prior to DN/manual) - R DF 5/5 (from prior session)  Pelvic floor: TTP through R posterior PR/PC and OI as well as 5 o'clock region of posterior fourchette. TTP to to L posterior fourchette at ~ 5 o'clock. Coordinated and able to keep probe in even at ~ 45 degrees. Increased force production when TA and obliques recruited. ~ 3+/5 strength and maximal endurance at ~ 8 seconds.  (from prior session)  TODAY: Strength ~ 2+/5 initially with TTP to anterior and posterior PR/PC as well as  decreased fascial mobility along anterior/inferior aspect of bladder.   **following treatment Pt. Able to achieve 3++/5 with decreased recruitment of the posterior PR/PC and IC near coccyx remaining.    Palpation:  Greatest tenderness with pressure on spinous processes T5 and L2.  TTP to  B QL and L3/4 multifidus as well as R pectineus, iliacus and Psoas  Gait Analysis: (from prior session) Gait speed: 1.68ms ; anterior pelvic tilt, knee hyperextension, heel strike - with cues pelvic neutral achieved, with appropriate arm swing and decreased heel strike.     INTERVENTIONS THIS SESSION: Manual: Performed TP release to B QL and L3/4 multifidus as well as R pectineus, iliacus and Psoas distally follwed by MET correction for R anterior rotation x 2 and L up-slip correction to decrease spasm and pain and allow for improved balance of musculature for improved function and decreased symptoms.  Therex: Reviewed and printed MET correction for R anterior rotation. Instructed Pt. To have her husband help her release the PFM near the coccyx to continue to help her recruit and feel the PFM for further strengthening and decreased leakage.   Total time: 60 min.                               PT Short Term Goals - 06/13/20 0904      PT SHORT TERM GOAL #1   Title Patient will demonstrate improved pelvic alignment and balance of musculature surrounding the pelvis to facilitate decreased PFM spasms and decrease pressure on nerve roots to allow for optimal PFM function    Baseline Has L foot-drop and pain in L glutes/knee as well as inability to sense PFM and L posterior innominate rotation/spasms surrounding pelvis.    Time 5    Period Weeks    Status Achieved    Target Date 11/26/19      PT SHORT TERM GOAL #2   Title Patient will demonstrate HEP x1 in the clinic to demonstrate understanding and proper form to allow for further improvement.    Baseline Pt. lacks knowledge  of therapeutic ecersise that will decrease Sx.    Time 5    Period Weeks    Status Achieved    Target Date 11/26/19      PT SHORT TERM GOAL #3   Title Pt. will be able to walk 250 Ft.with CGA and w/o assistive device to demonstrate improved balance/decreased instability and foot drop.    Baseline Pt. using RW to AMB due to L foot-drop and increased instability.    Time 5    Period Weeks    Status Achieved    Target Date 08/20/19      PT SHORT TERM GOAL #4   Title Patient will demonstrate a coordinated contraction, relaxation, and bulge of the pelvic floor muscles to demonstrate functional recruitment and motion and allow for further strengthening.  Baseline Pt. reports inability to sense the PFM for kegel or relaxation, having mixed UI    Time 5    Period Weeks    Status Achieved    Target Date 08/20/19             PT Long Term Goals - 10/17/20 0740      PT LONG TERM GOAL #1   Title Patient will report no episodes of UUI/SUI over the course of the prior two weeks to demonstrate improved functional ability.    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 As of 4/30: She had been doing better but then took a step backward when she was sick and vomiting a lot. As of 7/9: Pt. continues to have decreased incontinence but had an episode of total bladder loss and UUI ~ 1-2 times/day still (worsened due to overuse/spasm of LB musculature. As of 11/12: She had one "big accident" and one "snexe-pee" accident over the week.    Time 10    Period Weeks    Status On-going    Target Date 12/26/20      PT LONG TERM GOAL #2   Title Patient will report no pain with intercourse to demonstrate improved functional ability.    Baseline having mild pain with initial penetration As of 1:29: had some pain at initial penetration and with deeper thrusting at ~ 3-4/10 but "not terrible" 10/17/20: has not  attempted to know    Time 10    Period Weeks    Status On-going    Target Date 12/26/20      PT LONG TERM GOAL #3   Title Patient will score at or below 22/100 on the UDI and 27/100 on the UIQ to demonstrate a clinically meaningful decrease in disability and distress due to pelvic floor dysfunction.    Baseline UDI: 67, UIQ: 72/100 10-19-19: UDI 55.5, UIQ 76/100; POPDI 11/100 As of 1/29: UDI:33/100, UIQ: 6/100, POPDI:6/100. As of 4/30: Due to set-back from vomiting. As of 11/12: UDI: 17/100, UIQ: POPDI: 11/100    Time 10    Period Weeks    Status Achieved    Target Date 08/22/20      PT LONG TERM GOAL #4   Title Pt. 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    Baseline Pt. requiring RW for AMB due to L foot-drop and imbalance. As of 10/12: Pt. requiring some support (e.g. grocery cart) for > 20 min. of AMB or stairs.    Time 10    Period Weeks    Status Achieved    Target Date 11/25/20      PT LONG TERM GOAL #5   Title Pt. will improve in FOTO score by 10 points to demonstrate improved function.    Baseline FOTO PFDI Urinary: 29, Urinary Problem:54    Time 10    Period Weeks    Status New    Target Date 12/26/20      Additional Long Term Goals   Additional Long Term Goals Yes      PT LONG TERM GOAL #6   Title Patient will demonstrate 4+/5 strength or better in the PF and surrounding musculature to show improved functional strength for childcare and other vocational and recreational activities.    Baseline PFM 3/5  Time 10    Period Weeks    Status New    Target Date 12/26/20                 Plan - 12/12/20 1155    Clinical Impression Statement Pt. Responded well to all interventions today, demonstrating decreased PFM spasm and improved recruitment/strength by 2+ points, as well as understanding and correct performance of all education and exercises  provided today. They will continue to benefit from skilled physical therapy to work toward remaining goals and maximize function as well as decrease likelihood of symptom increase or recurrence.    PT Next Visit Plan test hip strength!!!!!!!!!!!! TPDN to LB, re-assess PFM, TPDN to R posterior scapular stabilizers and spinal mobilization/ rotational mobs into R rotation and perform PNF for the scapula and rainbows on the R.re-assess rib mobility and obliques/diaphragm, re-assess internally, review deep-core coordination (tall kneel, knee step through, stepping, lean-backs) stabilize gait pattern; Internal TP release to scar at posterior fourchette, treat spinal mobility to improve diaphragmcoordination and reduce L LE radiating symptoms; TP release vs. DN to, hip-flexors, and R pectineus. re-assess for Piriformis spasm (have performed DN and TP release now); check in with long term HEP performance    PT Home Exercise Plan A day: Te[place]ot, Side-planks, Planks, Dorothy's, scapular rows and I's, Y's, T's, and W's (temp. rows,sacp retractions, and stretch levator scap on R. Door stretch,B [id number] with leg lift Child's pose V-sit/ single leg hamstring stretch, side-lying hip ABD and prone hip EXT. Side stretch, modified bridge, Piriformis stretch, frog stretch Every day: Kegels 3-5x/day with long-hold and pelvic tilt with sneeze, laugh/cough kegel, vaginal weights, tall kneeling, tall-kneeling step-through, lean-backs, Corrective: Upper cervical rotationTennis ball releaseadded MET for R anterior for 1 week.    Consulted and Agree with Plan of Care Patient           Patient will benefit from skilled therapeutic intervention in order to improve the following deficits and impairments:     Visit Diagnosis: Other muscle spasm  Foot drop, left  Sacrococcygeal disorders, not elsewhere classified     Problem List Patient Active Problem List   Diagnosis Date Noted  . Knee pain 09/17/2020  . Urinary  incontinence 08/17/2019  . Foot drop, left 08/17/2019  . Maternal varicella, non-immune 11/20/2018   Willa Rough DPT, ATC Willa Rough 12/12/2020, 11:57 AM  [place] MAIN [facility] SERVICES [address] [place], [place], [address] Phone: [phone]   Fax:  [phone]  Name: Lisa Brady MRN: [id number] Date of Birth: [birth year]

## 2020-12-26 ENCOUNTER — Other Ambulatory Visit: Payer: Self-pay

## 2020-12-26 ENCOUNTER — Ambulatory Visit: Payer: No Typology Code available for payment source

## 2020-12-26 DIAGNOSIS — M62838 Other muscle spasm: Secondary | ICD-10-CM

## 2020-12-26 DIAGNOSIS — M533 Sacrococcygeal disorders, not elsewhere classified: Secondary | ICD-10-CM

## 2020-12-26 DIAGNOSIS — M21372 Foot drop, left foot: Secondary | ICD-10-CM

## 2020-12-26 NOTE — Patient Instructions (Signed)
°"  set" the core and start a kegel and hold as you bring one toe down at a time ~ 5 times in a row, taking a break when you feel the pelvic floor is fatigued. Do ~ 3 sets of 5 (L and R together).

## 2020-12-26 NOTE — Therapy (Signed)
[place] MAIN [facility] SERVICES [address] [place], [place], [address] Phone: [phone]   Fax:  [phone]  Physical Therapy Treatment  The patient has been informed of current processes in place at [facility]patient [facility] to protect patients from Covid-19 exposure including social distancing, schedule modifications, and new cleaning procedures. After discussing their particular risk with a therapist based on the patient's personal risk factors, the patient has decided to proceed with in-person therapy.  Patient Details  Name: Lisa Brady MRN: [id number] Date of Birth: [birth year] No data recorded  Encounter Date: 12/26/2020   PT End of Session - 12/26/20 1007    Visit Number 108    Number of Visits 40    Date for PT Re-Evaluation 12/26/20    Authorization Type Reform    Authorization - Visit Number 3    Authorization - Number of Visits 10    Progress Note Due on Visit 10    PT Start Time 0730    PT Stop Time 1779    PT Time Calculation (min) 65 min    Activity Tolerance Patient tolerated treatment well;No increased pain    Behavior During Therapy WFL for tasks assessed/performed           Past Medical History:  Diagnosis Date  . Foot drop     Past Surgical History:  Procedure Laterality Date  . WISDOM TOOTH EXTRACTION  2006    There were no vitals filed for this visit.   Pelvic Floor Physical Therapy Treatment Note  SCREENING  Changes in medications, allergies, or medical history?: none    SUBJECTIVE  Patient reports: Things are "OK" she did not end up doing any internal release. She has still been doing a lot of kegels with the kegel weights and she can tell that she is doing better but not as good as she has been in the past. The urge seems to have been better, she has not had any accidents. Forgot about the MET correction for a few days. Her neck was bothering her but she was able to do HEP to improve Sx. And it  has been good for 2-3 days. She has had intermittent LBP.   Precautions:  Hypermobility and Scoliosis  Pain update:  Location of pain:  Low back hip/side (neck) Current pain:  0/10 (0/10 with turning) Max pain:  3/10  (2/10) Least pain:  0/10 (0/10) Nature of pain: tightness/achy  **no increased pain following treatment   Patient Goals: Control her bladder/not need depends.   OBJECTIVE  Changes in:   Observation:  Slight R anterior innominate rotation (from previous session)  R scapula protracted and winging>L (from prior session)  TODAY: alignment not assessed before MMT performed, audble "pop" when performing MMT for L adduction and then pelvis assessed and appropriately aligned in supine.  ROM/Mobility:  Decreased scapular depression and downward rotation ROM in R>L scapula. Decreased L cervical rotation with painful end-feel. Decreased mobility at pubic symphysis and pain with pressure at L Pubic ramus  (from prior session)  Pt demonstrates decreased upward rotation ROM for R scapula due to spasm/ fascial tightness that is limiting proprioception and recruitment for effective downward scapular rotation strengthening. (from previous session)   Strength:  Muscle fatigue reached earlier on R>L with tall-kneeling/balance exercises. Imbalance surrounding R scapula not allowing Pt. To perform I's, Y's, T's W's well. (from prior session)  MMT LE  - L DF 4+/5 (different rater may account for difference but could  be due to spasms in back since test performed prior to DN/manual) - R DF 5/5 (from prior session)  Strength/MMT:  LE MMT (12/26/20) LE MMT Left Right  Hip flex:  (L2) /5 /5  Hip ext: 5/5 5/5  Hip abd: 4+/5 5/5  Hip add: 4+/5 5/5  Hip IR 4/5 4+/5  Hip ER 5/5 5/5  *audible "pop" with mild discomfort noted with resisted L adduction.  Deep-core strength/endurance sufficient to allow Pt. To perform toe-taps x5 in a row before rest needed.  Pelvic floor: TTP  through R posterior PR/PC and OI as well as 5 o'clock region of posterior fourchette. TTP to to L posterior fourchette at ~ 5 o'clock. Coordinated and able to keep probe in even at ~ 45 degrees. Increased force production when TA and obliques recruited. ~ 3+/5 strength and maximal endurance at ~ 8 seconds.  (from prior session)  1/7:  Strength ~ 2+/5 initially with TTP to anterior and posterior PR/PC as well as decreased fascial mobility along anterior/inferior aspect of bladder.  **following treatment Pt. Able to achieve 3++/5 with decreased recruitment of the posterior PR/PC and IC near coccyx remaining. (from previous session)  TODAY: TTP to R>L coccygeus, OI, and IC near coccyx. Strength ~ 3/5 and 3 sec. endurance with poor recruitment of posterior PFM.  **following treatment, strength ~ 3++/5 with much more squeeze from posterior PFM and ~ 8 sec. Hold.  Palpation:  Greatest tenderness with pressure on spinous processes T5 and L2. (from previous session)  Gait Analysis: (from prior session) Gait speed: 1.62ms ; anterior pelvic tilt, knee hyperextension, heel strike - with cues pelvic neutral achieved, with appropriate arm swing and decreased heel strike.     INTERVENTIONS THIS SESSION: Manual: Performed TP release to B  coccygeus, OI, and IC near coccyx to decrease spasm and pain and allow for improved balance and recruitment of musculature for improved function and decreased symptoms.  Therex: Performed MMT and progressed mini-marches to toe-taps to further increase deep-core stability and help minimize pelvic instability to prevent return of spasms and pain.   Total time: 60 min.                              PT Short Term Goals - 06/13/20 0904      PT SHORT TERM GOAL #1   Title Patient will demonstrate improved pelvic alignment and balance of musculature surrounding the pelvis to facilitate decreased PFM spasms and decrease pressure on nerve roots to allow  for optimal PFM function    Baseline Has L foot-drop and pain in L glutes/knee as well as inability to sense PFM and L posterior innominate rotation/spasms surrounding pelvis.    Time 5    Period Weeks    Status Achieved    Target Date 11/26/19      PT SHORT TERM GOAL #2   Title Patient will demonstrate HEP x1 in the clinic to demonstrate understanding and proper form to allow for further improvement.    Baseline Pt. lacks knowledge of therapeutic ecersise that will decrease Sx.    Time 5    Period Weeks    Status Achieved    Target Date 11/26/19      PT SHORT TERM GOAL #3   Title Pt. will be able to walk 250 Ft.with CGA and w/o assistive device to demonstrate improved balance/decreased instability and foot drop.    Baseline Pt. using RW to AMB due  to L foot-drop and increased instability.    Time 5    Period Weeks    Status Achieved    Target Date 08/20/19      PT SHORT TERM GOAL #4   Title Patient will demonstrate a coordinated contraction, relaxation, and bulge of the pelvic floor muscles to demonstrate functional recruitment and motion and allow for further strengthening.    Baseline Pt. reports inability to sense the PFM for kegel or relaxation, having mixed UI    Time 5    Period Weeks    Status Achieved    Target Date 08/20/19             PT Long Term Goals - 10/17/20 0740      PT LONG TERM GOAL #1   Title Patient will report no episodes of UUI/SUI over the course of the prior two weeks to demonstrate improved functional ability.    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 As of 4/30: She had been doing better but then took a step backward when she was sick and vomiting a lot. As of 7/9: Pt. continues to have decreased incontinence but had an episode of total bladder loss and UUI ~ 1-2 times/day still (worsened due to overuse/spasm of LB musculature. As of 11/12: She had one  "big accident" and one "snexe-pee" accident over the week.    Time 10    Period Weeks    Status On-going    Target Date 12/26/20      PT LONG TERM GOAL #2   Title Patient will report no pain with intercourse to demonstrate improved functional ability.    Baseline having mild pain with initial penetration As of 1:29: had some pain at initial penetration and with deeper thrusting at ~ 3-4/10 but "not terrible" 10/17/20: has not attempted to know    Time 10    Period Weeks    Status On-going    Target Date 12/26/20      PT LONG TERM GOAL #3   Title Patient will score at or below 22/100 on the UDI and 27/100 on the UIQ to demonstrate a clinically meaningful decrease in disability and distress due to pelvic floor dysfunction.    Baseline UDI: 67, UIQ: 72/100 10-19-19: UDI 55.5, UIQ 76/100; POPDI 11/100 As of 1/29: UDI:33/100, UIQ: 6/100, POPDI:6/100. As of 4/30: Due to set-back from vomiting. As of 11/12: UDI: 17/100, UIQ: POPDI: 11/100    Time 10    Period Weeks    Status Achieved    Target Date 08/22/20      PT LONG TERM GOAL #4   Title Pt. 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    Baseline Pt. requiring RW for AMB due to L foot-drop and imbalance. As of 10/12: Pt. requiring some support (e.g. grocery cart) for > 20 min. of AMB or stairs.    Time 10    Period Weeks    Status Achieved    Target Date 11/25/20      PT LONG TERM GOAL #5   Title Pt. will improve in FOTO score by 10 points to demonstrate improved function.    Baseline FOTO PFDI Urinary: 29, Urinary Problem:54    Time 10    Period Weeks    Status  New    Target Date 12/26/20      Additional Long Term Goals   Additional Long Term Goals Yes      PT LONG TERM GOAL #6   Title Patient will demonstrate 4+/5 strength or better in the PF and surrounding musculature to show improved functional strength for childcare  and other vocational and recreational activities.    Baseline PFM 3/5    Time 10    Period Weeks    Status New    Target Date 12/26/20                 Plan - 12/26/20 1008    Clinical Impression Statement Pt. Responded well to all interventions today, demonstrating improved deep-core strength and pelvic alignment, decreased TTP and increased recruitment of the PFM, as well as understanding and correct performance of all education and exercises provided today. They will continue to benefit from skilled physical therapy to work toward remaining goals and maximize function as well as decrease likelihood of symptom increase or recurrence.    PT Next Visit Plan TPDN to LB with TENS, Review toe-taps, re-assess PFM, TPDN to R posterior scapular stabilizers and spinal mobilization/ rotational mobs into R rotation and perform PNF for the scapula and rainbows on the R.re-assess rib mobility and obliques/diaphragm, re-assess internally, review deep-core coordination (tall kneel, knee step through, stepping, lean-backs) stabilize gait pattern; Internal TP release to scar at posterior fourchette, treat spinal mobility to improve diaphragmcoordination and reduce L LE radiating symptoms; TP release vs. DN to, hip-flexors, and R pectineus. re-assess for Piriformis spasm (have performed DN and TP release now); check in with long term HEP performance    PT Home Exercise Plan A day: Te[place]ot, Side-planks, Planks, Dorothy's, scapular rows and I's, Y's, T's, and W's (temp. rows,sacp retractions, and stretch levator scap on R. Door stretch,B [id number] with leg lift Child's pose V-sit/ single leg hamstring stretch, side-lying hip ABD and prone hip EXT. Side stretch, modified bridge, Piriformis stretch, frog stretch Every day: Kegels 3-5x/day with long-hold and pelvic tilt with sneeze, laugh/cough kegel, vaginal weights, tall kneeling, tall-kneeling step-through, lean-backs, Corrective: Upper cervical rotationTennis  ball releaseadded MET for R anterior for 1 week.    Consulted and Agree with Plan of Care Patient           Patient will benefit from skilled therapeutic intervention in order to improve the following deficits and impairments:     Visit Diagnosis: Other muscle spasm  Foot drop, left  Sacrococcygeal disorders, not elsewhere classified     Problem List Patient Active Problem List   Diagnosis Date Noted  . Knee pain 09/17/2020  . Urinary incontinence 08/17/2019  . Foot drop, left 08/17/2019  . Maternal varicella, non-immune 11/20/2018   Willa Rough DPT, ATC Willa Rough 12/26/2020, 10:10 AM  [place] MAIN [facility] SERVICES [address] [place], [place], [address] Phone: [phone]   Fax:  [phone]  Name: Lisa Brady MRN: [id number] Date of Birth: 1985-12-20

## 2021-01-09 ENCOUNTER — Ambulatory Visit: Payer: No Typology Code available for payment source | Attending: Obstetrics and Gynecology

## 2021-01-09 ENCOUNTER — Other Ambulatory Visit: Payer: Self-pay

## 2021-01-09 DIAGNOSIS — M62838 Other muscle spasm: Secondary | ICD-10-CM | POA: Insufficient documentation

## 2021-01-09 DIAGNOSIS — M21372 Foot drop, left foot: Secondary | ICD-10-CM | POA: Insufficient documentation

## 2021-01-09 DIAGNOSIS — M533 Sacrococcygeal disorders, not elsewhere classified: Secondary | ICD-10-CM | POA: Insufficient documentation

## 2021-01-09 NOTE — Patient Instructions (Signed)
As you start wearing your heel-lift only wear it for an hour the first day and increase by an hour each day so you can allow for the body to adapt to the change easily without much pain. If your pain increases by more than 1-2 points, back off slightly or slow down how quickly you increase your wear time. Once you reach a full day of wear, use it as much as possible forever, even in house-shoes or flip-flops if necessary to keep yourself from reverting to bad pelvic and spinal alignment and having symptoms return.    Adjust-a-lift heel lift Can be found at [email or web address]  

## 2021-01-09 NOTE — Therapy (Signed)
[place] MAIN [facility] SERVICES [address] [place], [place], [address] Phone: [phone]   Fax:  [phone]  Physical Therapy Treatment  The patient has been informed of current processes in place at [facility]patient [facility] to protect patients from Covid-19 exposure including social distancing, schedule modifications, and new cleaning procedures. After discussing their particular risk with a therapist based on the patient's personal risk factors, the patient has decided to proceed with in-person therapy.  Patient Details  Name: Lisa Brady MRN: [id number] Date of Birth: 1986/09/05 No data recorded  Encounter Date: 01/09/2021   PT End of Session - 01/09/21 1218    Visit Number 58    Number of Visits 62    Date for PT Re-Evaluation 12/26/20    Authorization Type [place]    Authorization - Visit Number 4    Authorization - Number of Visits 10    Progress Note Due on Visit 10    PT Start Time 3875    PT Stop Time 0835    PT Time Calculation (min) 60 min    Activity Tolerance Patient tolerated treatment well;No increased pain    Behavior During Therapy WFL for tasks assessed/performed           Past Medical History:  Diagnosis Date  . Foot drop     Past Surgical History:  Procedure Laterality Date  . WISDOM TOOTH EXTRACTION  2006    There were no vitals filed for this visit.  Pelvic Floor Physical Therapy Treatment Note  SCREENING  Changes in medications, allergies, or medical history?: none    SUBJECTIVE  Patient reports: She has felt like her L leg is long and has been hit or miss with Kegel success. She had one major bladder emptying episode and has had some increased urgency and some neck and back pain.  Precautions:  Hypermobility and Scoliosis  Pain update:  Location of pain:  Low back hip/side (neck) Current pain:  0/10 (0/10 with turning) Max pain:  2/10  (3/10) Least pain:  0/10 (0/10) Nature of pain:  tightness/achy  **no increased pain following treatment   Patient Goals: Control her bladder/not need depends.   OBJECTIVE  Changes in:   Observation:  Slight R anterior innominate rotation (from previous session)  R scapula protracted and winging>L (from prior session)  12/26/20: alignment not assessed before MMT performed, audble "pop" when performing MMT for L adduction and then pelvis assessed and appropriately aligned in supine.  TODAY: Pt. Demonstrates R anterior rotation, L up-slip and LLD with LLE long was finally detected.  **PSIS appear level in standing with 2/3 heel-lift in R shoe.  ROM/Mobility:  Decreased scapular depression and downward rotation ROM in R>L scapula. Decreased L cervical rotation with painful end-feel. Decreased mobility at pubic symphysis and pain with pressure at L Pubic ramus  (from prior session)  Pt demonstrates decreased upward rotation ROM for R scapula due to spasm/ fascial tightness that is limiting proprioception and recruitment for effective downward scapular rotation strengthening. (from previous session)   Strength:  Muscle fatigue reached earlier on R>L with tall-kneeling/balance exercises. Imbalance surrounding R scapula not allowing Pt. To perform I's, Y's, T's W's well. (from prior session)  MMT LE  - L DF 4+/5 (different rater may account for difference but could be due to spasms in back since test performed prior to DN/manual) - R DF 5/5 (from prior session)  Strength/MMT:  LE MMT (12/26/20) LE MMT Left Right  Hip  flex:  (L2) /5 /5  Hip ext: 5/5 5/5  Hip abd: 4+/5 5/5  Hip add: 4+/5 5/5  Hip IR 4/5 4+/5  Hip ER 5/5 5/5  *audible "pop" with mild discomfort noted with resisted L adduction.  Deep-core strength/endurance sufficient to allow Pt. To perform toe-taps x5 in a row before rest needed.  Pelvic floor: TTP through R posterior PR/PC and OI as well as 5 o'clock region of posterior fourchette. TTP to to L posterior  fourchette at ~ 5 o'clock. Coordinated and able to keep probe in even at ~ 45 degrees. Increased force production when TA and obliques recruited. ~ 3+/5 strength and maximal endurance at ~ 8 seconds.  (from prior session)  1/7:  Strength ~ 2+/5 initially with TTP to anterior and posterior PR/PC as well as decreased fascial mobility along anterior/inferior aspect of bladder.  **following treatment Pt. Able to achieve 3++/5 with decreased recruitment of the posterior PR/PC and IC near coccyx remaining. (from previous session)  TODAY: TTP to R>L coccygeus, OI, and IC near coccyx. Strength ~ 3/5 and 3 sec. endurance with poor recruitment of posterior PFM.  **following treatment, strength ~ 3++/5 with much more squeeze from posterior PFM and ~ 8 sec. Hold.  Palpation:  Greatest tenderness with pressure on spinous processes T5 and L2. (from previous session)  TODAY: TTP to R Iliacus and L QL.  Gait Analysis: (from prior session) Gait speed: 1.13ms ; anterior pelvic tilt, knee hyperextension, heel strike - with cues pelvic neutral achieved, with appropriate arm swing and decreased heel strike.     INTERVENTIONS THIS SESSION: Manual: Performed TP release to R iliacus an L QL followed by MET correction for the R anterior rotation x2 and L up-slip correction to decrease spasm and pain and allow for improved pelvic alignment and balance and recruitment of musculature for improved function and decreased symptoms. Re-assessed pelvic alignment and determined that the LLE is 1 cm. Longer than RLE.   Self-care: Educated on and provided a heel-lift for RLE at 2/3 thickness to improve pelvic alignment and help prevent return of up-slip and rotation to keep pressure off of lumbar nerve roots and help prevent POP and UI Sx.   Total time: 60 min.                                PT Short Term Goals - 06/13/20 0904      PT SHORT TERM GOAL #1   Title Patient will demonstrate  improved pelvic alignment and balance of musculature surrounding the pelvis to facilitate decreased PFM spasms and decrease pressure on nerve roots to allow for optimal PFM function    Baseline Has L foot-drop and pain in L glutes/knee as well as inability to sense PFM and L posterior innominate rotation/spasms surrounding pelvis.    Time 5    Period Weeks    Status Achieved    Target Date 11/26/19      PT SHORT TERM GOAL #2   Title Patient will demonstrate HEP x1 in the clinic to demonstrate understanding and proper form to allow for further improvement.    Baseline Pt. lacks knowledge of therapeutic ecersise that will decrease Sx.    Time 5    Period Weeks    Status Achieved    Target Date 11/26/19      PT SHORT TERM GOAL #3   Title Pt. will be able to walk 250 Ft.with  CGA and w/o assistive device to demonstrate improved balance/decreased instability and foot drop.    Baseline Pt. using RW to AMB due to L foot-drop and increased instability.    Time 5    Period Weeks    Status Achieved    Target Date 08/20/19      PT SHORT TERM GOAL #4   Title Patient will demonstrate a coordinated contraction, relaxation, and bulge of the pelvic floor muscles to demonstrate functional recruitment and motion and allow for further strengthening.    Baseline Pt. reports inability to sense the PFM for kegel or relaxation, having mixed UI    Time 5    Period Weeks    Status Achieved    Target Date 08/20/19             PT Long Term Goals - 10/17/20 0740      PT LONG TERM GOAL #1   Title Patient will report no episodes of UUI/SUI over the course of the prior two weeks to demonstrate improved functional ability.    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 As of 4/30: She had been doing better but then took a step backward when she was sick and vomiting a lot. As of 7/9: Pt. continues to have decreased  incontinence but had an episode of total bladder loss and UUI ~ 1-2 times/day still (worsened due to overuse/spasm of LB musculature. As of 11/12: She had one "big accident" and one "snexe-pee" accident over the week.    Time 10    Period Weeks    Status On-going    Target Date 12/26/20      PT LONG TERM GOAL #2   Title Patient will report no pain with intercourse to demonstrate improved functional ability.    Baseline having mild pain with initial penetration As of 1:29: had some pain at initial penetration and with deeper thrusting at ~ 3-4/10 but "not terrible" 10/17/20: has not attempted to know    Time 10    Period Weeks    Status On-going    Target Date 12/26/20      PT LONG TERM GOAL #3   Title Patient will score at or below 22/100 on the UDI and 27/100 on the UIQ to demonstrate a clinically meaningful decrease in disability and distress due to pelvic floor dysfunction.    Baseline UDI: 67, UIQ: 72/100 10-19-19: UDI 55.5, UIQ 76/100; POPDI 11/100 As of 1/29: UDI:33/100, UIQ: 6/100, POPDI:6/100. As of 4/30: Due to set-back from vomiting. As of 11/12: UDI: 17/100, UIQ: POPDI: 11/100    Time 10    Period Weeks    Status Achieved    Target Date 08/22/20      PT LONG TERM GOAL #4   Title Pt. 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    Baseline Pt. requiring RW for AMB due to L foot-drop and imbalance. As of 10/12: Pt. requiring some support (e.g. grocery cart) for > 20 min. of AMB or stairs.    Time 10    Period Weeks    Status Achieved    Target Date 11/25/20      PT LONG TERM GOAL #5   Title Pt. will improve in FOTO score by 10 points to demonstrate improved function.  Baseline FOTO PFDI Urinary: 29, Urinary Problem:54    Time 10    Period Weeks    Status New    Target Date 12/26/20      Additional Long Term Goals   Additional Long Term Goals Yes      PT LONG  TERM GOAL #6   Title Patient will demonstrate 4+/5 strength or better in the PF and surrounding musculature to show improved functional strength for childcare and other vocational and recreational activities.    Baseline PFM 3/5    Time 10    Period Weeks    Status New    Target Date 12/26/20                 Plan - 01/09/21 1218    Clinical Impression Statement Pt. Responded well to all interventions today, demonstrating decreased spasm and TTP, improved pelvic alignment, as well as understanding and correct performance of all education and exercises provided today. They will continue to benefit from skilled physical therapy to work toward remaining goals and maximize function as well as decrease likelihood of symptom increase or recurrence.    PT Next Visit Plan re-assess pelvic alignment with heel-lift in place. TPDN to LB with TENS, Review toe-taps, re-assess PFM, TPDN to R posterior scapular stabilizers and spinal mobilization/ rotational mobs into R rotation and perform PNF for the scapula and rainbows on the R.re-assess rib mobility and obliques/diaphragm, re-assess internally, review deep-core coordination (tall kneel, knee step through, stepping, lean-backs) stabilize gait pattern; Internal TP release to scar at posterior fourchette, treat spinal mobility to improve diaphragmcoordination and reduce L LE radiating symptoms; TP release vs. DN to, hip-flexors, and R pectineus. re-assess for Piriformis spasm (have performed DN and TP release now); check in with long term HEP performance    PT Home Exercise Plan A day: [place], Side-planks, Planks, Dorothy's, scapular rows and I's, Y's, T's, and W's (temp. rows,sacp retractions, and stretch levator scap on R. Door stretch,B JOA:[id number] with leg lift Child's pose V-sit/ single leg hamstring stretch, side-lying hip ABD and prone hip EXT. Side stretch, modified bridge, Piriformis stretch, frog stretch Every day: Kegels 3-5x/day with long-hold  and pelvic tilt with sneeze, laugh/cough kegel, vaginal weights, tall kneeling, tall-kneeling step-through, lean-backs, Corrective: Upper cervical rotationTennis ball releaseadded MET for R anterior for 1 week. Heel-lift for RLE.    Consulted and Agree with Plan of Care Patient           Patient will benefit from skilled therapeutic intervention in order to improve the following deficits and impairments:     Visit Diagnosis: Other muscle spasm  Foot drop, left  Sacrococcygeal disorders, not elsewhere classified     Problem List Patient Active Problem List   Diagnosis Date Noted  . Knee pain 09/17/2020  . Urinary incontinence 08/17/2019  . Foot drop, left 08/17/2019  . Maternal varicella, non-immune 11/20/2018   Willa Rough DPT, ATC Willa Rough 01/09/2021, 12:55 PM  [place] MAIN [facility] SERVICES [address] [place], [place], [address] Phone: [phone]   Fax:  [phone]  Name: Lisa Brady MRN: [id number] Date of Birth: 02/25/1986

## 2021-02-06 ENCOUNTER — Ambulatory Visit: Payer: No Typology Code available for payment source | Attending: Obstetrics and Gynecology

## 2021-02-06 ENCOUNTER — Other Ambulatory Visit: Payer: Self-pay

## 2021-02-06 DIAGNOSIS — M21372 Foot drop, left foot: Secondary | ICD-10-CM | POA: Insufficient documentation

## 2021-02-06 DIAGNOSIS — M62838 Other muscle spasm: Secondary | ICD-10-CM | POA: Diagnosis present

## 2021-02-06 DIAGNOSIS — M533 Sacrococcygeal disorders, not elsewhere classified: Secondary | ICD-10-CM | POA: Diagnosis present

## 2021-02-06 NOTE — Therapy (Addendum)
[place] [facility] SERVICES [address] [place], [place], [address] Phone: [phone]   Fax:  [phone]  Physical Therapy Treatment  The patient has been informed of current processes in place at Outpatient Rehab to protect patients from Covid-19 exposure including social distancing, schedule modifications, and new cleaning procedures. After discussing their particular risk with a therapist based on the patient's personal risk factors, the patient has decided to proceed with in-person therapy.  Patient Details  Name: Lisa Brady MRN: [id number] Date of Birth: 1986-11-27 No data recorded  Encounter Date: 02/06/2021   PT End of Session - 02/10/21 1057    Visit Number 59    Number of Visits 55    Date for PT Re-Evaluation 12/26/20    Authorization Type [place]    Authorization - Visit Number 5    Authorization - Number of Visits 10    Progress Note Due on Visit 10    PT Start Time 0730    PT Stop Time 0830    PT Time Calculation (min) 60 min    Activity Tolerance Patient tolerated treatment well;No increased pain    Behavior During Therapy WFL for tasks assessed/performed           Past Medical History:  Diagnosis Date  . Foot drop     Past Surgical History:  Procedure Laterality Date  . WISDOM TOOTH EXTRACTION  2006    There were no vitals filed for this visit.  Pelvic Floor Physical Therapy Treatment Note  SCREENING  Changes in medications, allergies, or medical history?: none    SUBJECTIVE  Patient reports: [place]. She has started to try getting pregnant and is a day late. She does feel like the lift has helped with the consistency some. She feels like she can feel the kegels more consistetly but has not seen a lot of improvement in her PFM strength consistency. She has had a little bit of an emotionally stressful week but had been doing her exercises pretty regularly and  the arm and leg exercises are getting easy. She feels like she is not getting very far with the toe-taps because her kegels are are still weak and inconsistent. She has felt that her R side has been a little tight/uncomfortable the last few days. R knee is ~ 3/10 today    Precautions:  Hypermobility and Scoliosis  Pain update:  Location of pain:  Low back hip/side (neck) Current pain:  1-2/10 (0/10 with turning) Max pain:  2/10  (3/10) Least pain:  0/10 (0/10) Nature of pain: tightness/achy  **no increased pain following treatment   Patient Goals: Control her bladder/not need depends.   OBJECTIVE  Changes in:   Observation:  Slight R anterior innominate rotation (from previous session)  R scapula protracted and winging>L (from prior session)  12/26/20: alignment not assessed before MMT performed, audble "pop" when performing MMT for L adduction and then pelvis assessed and appropriately aligned in supine.  01/09/21: Pt. Demonstrates R anterior rotation, L up-slip and LLD with LLE long was finally detected. **PSIS appear level in standing with 2/3 heel-lift in R shoe.  TODAY mild R anterior rotation, resolved following treatment.  ROM/Mobility:  Decreased scapular depression and downward rotation ROM in R>L scapula. Decreased L cervical rotation with painful end-feel. Decreased mobility at pubic symphysis and pain with pressure at L Pubic ramus  (from prior session)  Pt demonstrates decreased upward rotation ROM  for R scapula due to spasm/ fascial tightness that is limiting proprioception and recruitment for effective downward scapular rotation strengthening. (from previous session)   Strength:  Muscle fatigue reached earlier on R>L with tall-kneeling/balance exercises. Imbalance surrounding R scapula not allowing Pt. To perform I's, Y's, T's W's well. (from prior session)  MMT LE  - L DF 4+/5 (different rater may account for difference but could be due to spasms in back  since test performed prior to DN/manual) - R DF 5/5 (from prior session)  Strength/MMT:  LE MMT (12/26/20) LE MMT Left Right  Hip flex:  (L2) /5 /5  Hip ext: 5/5 5/5  Hip abd: 4+/5 5/5  Hip add: 4+/5 5/5  Hip IR 4/5 4+/5  Hip ER 5/5 5/5  *audible "pop" with mild discomfort noted with resisted L adduction.  (02-06-21) LE MMT Left Right  Hip flex:  (L2) /5 /5  Hip ext: -5/5 5/5  Hip abd: 5/5 5/5  Hip add: 4+/5 4/5!  Hip IR 4+/5 4+/5  Hip ER 4++/5 4++/5    Deep-core strength/endurance sufficient to allow Pt. To perform toe-taps x5 in a row before rest needed.  Pelvic floor: TTP through R posterior PR/PC and OI as well as 5 o'clock region of posterior fourchette. TTP to to L posterior fourchette at ~ 5 o'clock. Coordinated and able to keep probe in even at ~ 45 degrees. Increased force production when TA and obliques recruited. ~ 3+/5 strength and maximal endurance at ~ 8 seconds.  (from prior session)  1/7:  Strength ~ 2+/5 initially with TTP to anterior and posterior PR/PC as well as decreased fascial mobility along anterior/inferior aspect of bladder.  **following treatment Pt. Able to achieve 3++/5 with decreased recruitment of the posterior PR/PC and IC near coccyx remaining. (from previous session)  01/09/21: TTP to R>L coccygeus, OI, and IC near coccyx. Strength ~ 3/5 and 3 sec. endurance with poor recruitment of posterior PFM.  **following treatment, strength ~ 3++/5 with much more squeeze from posterior PFM and ~ 8 sec. Hold.  TODAY: not re-assessed today.  Palpation:  Greatest tenderness with pressure on spinous processes T5 and L2. (from previous session)  TODAY: TTP to R TFL, iliopsoas, vastus lateralis and rectus femoris  Gait Analysis: (from prior session) Gait speed: 1.58ms ; anterior pelvic tilt, knee hyperextension, heel strike - with cues pelvic neutral achieved, with appropriate arm swing and decreased heel strike.     INTERVENTIONS THIS SESSION: Manual:  re-assessed ip strength and performed TP release to R TFL, iliopsoas, vastus lateralis and rectus femoris followed by MET correction for R anterior rotation to decrease spasm and pain and allow for improved balance of musculature for improved function and decreased symptoms.  Therex: Upgraded bands for UE strengthening, encouraged to start walking with mindfulness on posture, begin pelvic-floor pilates videos to improve deep-core strengthening and help prepare the body for added stresses of pregnancy.    Total time: 60 min.                              PT Short Term Goals - 06/13/20 0904      PT SHORT TERM GOAL #1   Title Patient will demonstrate improved pelvic alignment and balance of musculature surrounding the pelvis to facilitate decreased PFM spasms and decrease pressure on nerve roots to allow for optimal PFM function    Baseline Has L foot-drop and pain in L glutes/knee as well  as inability to sense PFM and L posterior innominate rotation/spasms surrounding pelvis.    Time 5    Period Weeks    Status Achieved    Target Date 11/26/19      PT SHORT TERM GOAL #2   Title Patient will demonstrate HEP x1 in the clinic to demonstrate understanding and proper form to allow for further improvement.    Baseline Pt. lacks knowledge of therapeutic ecersise that will decrease Sx.    Time 5    Period Weeks    Status Achieved    Target Date 11/26/19      PT SHORT TERM GOAL #3   Title Pt. will be able to walk 250 Ft.with CGA and w/o assistive device to demonstrate improved balance/decreased instability and foot drop.    Baseline Pt. using RW to AMB due to L foot-drop and increased instability.    Time 5    Period Weeks    Status Achieved    Target Date 08/20/19      PT SHORT TERM GOAL #4   Title Patient will demonstrate a coordinated contraction, relaxation, and bulge of the pelvic floor muscles to demonstrate functional recruitment and motion and allow for  further strengthening.    Baseline Pt. reports inability to sense the PFM for kegel or relaxation, having mixed UI    Time 5    Period Weeks    Status Achieved    Target Date 08/20/19             PT Long Term Goals - 10/17/20 0740      PT LONG TERM GOAL #1   Title Patient will report no episodes of UUI/SUI over the course of the prior two weeks to demonstrate improved functional ability.    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 As of 4/30: She had been doing better but then took a step backward when she was sick and vomiting a lot. As of 7/9: Pt. continues to have decreased incontinence but had an episode of total bladder loss and UUI ~ 1-2 times/day still (worsened due to overuse/spasm of LB musculature. As of 11/12: She had one "big accident" and one "snexe-pee" accident over the week.    Time 10    Period Weeks    Status On-going    Target Date 12/26/20      PT LONG TERM GOAL #2   Title Patient will report no pain with intercourse to demonstrate improved functional ability.    Baseline having mild pain with initial penetration As of 1:29: had some pain at initial penetration and with deeper thrusting at ~ 3-4/10 but "not terrible" 10/17/20: has not attempted to know    Time 10    Period Weeks    Status On-going    Target Date 12/26/20      PT LONG TERM GOAL #3   Title Patient will score at or below 22/100 on the UDI and 27/100 on the UIQ to demonstrate a clinically meaningful decrease in disability and distress due to pelvic floor dysfunction.    Baseline UDI: 67, UIQ: 72/100 10-19-19: UDI 55.5, UIQ 76/100; POPDI 11/100 As of 1/29: UDI:33/100, UIQ: 6/100, POPDI:6/100. As of 4/30: Due to set-back from vomiting. As of 11/12: UDI: 17/100, UIQ: POPDI: 11/100    Time 10    Period Weeks    Status Achieved    Target Date 08/22/20  PT LONG TERM GOAL #4   Title Pt. will demonstrate  appropriate gait mechanics and be able to perform ADL's without use of AD and without pain to allow for child-care and return to work.    Baseline Pt. requiring RW for AMB due to L foot-drop and imbalance. As of 10/12: Pt. requiring some support (e.g. grocery cart) for > 20 min. of AMB or stairs.    Time 10    Period Weeks    Status Achieved    Target Date 11/25/20      PT LONG TERM GOAL #5   Title Pt. will improve in FOTO score by 10 points to demonstrate improved function.    Baseline FOTO PFDI Urinary: 29, Urinary Problem:54    Time 10    Period Weeks    Status New    Target Date 12/26/20      Additional Long Term Goals   Additional Long Term Goals Yes      PT LONG TERM GOAL #6   Title Patient will demonstrate 4+/5 strength or better in the PF and surrounding musculature to show improved functional strength for childcare and other vocational and recreational activities.    Baseline PFM 3/5    Time 10    Period Weeks    Status New    Target Date 12/26/20                 Plan - 02/10/21 1057    Clinical Impression Statement Pt. Has made between-session improvement in maintenance of decreased UI and pain and Responded well to all interventions today, demonstrating improved pelvic alignment, decreased spasm and TTP, as well as understanding and correct performance of all education and exercises provided today. They will continue to benefit from skilled physical therapy to work toward remaining goals and maximize function as well as decrease likelihood of symptom increase or recurrence.    PT Next Visit Plan re-assess pelvic alignment with heel-lift in place. discuss plan for strengthening and walking program, stabilize gait pattern, discuss benefits of aquatic exercise. Review toe-taps, re-assess PFM, TPDN to R posterior scapular stabilizers and spinal mobilization/ rotational mobs into R rotation and perform PNF for the scapula and rainbows on the Re-assess rib mobility and  obliques/diaphragm, review deep-core coordination (tall kneel, knee step through, stepping, lean-backs); re-assess for Piriformis spasm (have performed DN and TP release now); check in with long term HEP performance    PT Home Exercise Plan A day: [place], Side-planks, Planks, Dorothy's, scapular rows and I's, Y's, T's, and W's (temp. rows,sacp retractions, and stretch levator scap on R. Door stretch,B [id number] with leg lift Child's pose V-sit/ single leg hamstring stretch, side-lying hip ABD and prone hip EXT. Side stretch, modified bridge, Piriformis stretch, frog stretch Every day: Kegels 3-5x/day with long-hold and pelvic tilt with sneeze, laugh/cough kegel, vaginal weights, tall kneeling, tall-kneeling step-through, lean-backs, Corrective: Upper cervical rotationTennis ball releaseadded MET for R anterior for 1 week. Heel-lift for RLE. Pilates videos.    Consulted and Agree with Plan of Care Patient           Patient will benefit from skilled therapeutic intervention in order to improve the following deficits and impairments:     Visit Diagnosis: Other muscle spasm  Foot drop, left  Sacrococcygeal disorders, not elsewhere classified     Problem List Patient Active Problem List   Diagnosis Date Noted  . Knee pain 09/17/2020  . Urinary incontinence 08/17/2019  . Foot drop, left 08/17/2019  . Maternal  varicella, non-immune 11/20/2018   Willa Rough DPT, ATC Willa Rough 02/10/2021, 11:01 AM  [place] [facility] SERVICES [address] [place], [place], [address] Phone: [phone]   Fax:  [phone]  Name: Lisa Brady MRN: [id number] Date of Birth: 1985-12-25

## 2021-02-12 ENCOUNTER — Ambulatory Visit: Payer: No Typology Code available for payment source | Admitting: Dermatology

## 2021-02-20 ENCOUNTER — Ambulatory Visit: Payer: No Typology Code available for payment source

## 2021-02-25 ENCOUNTER — Other Ambulatory Visit: Payer: Self-pay

## 2021-02-25 ENCOUNTER — Encounter: Payer: Self-pay | Admitting: Primary Care

## 2021-02-25 ENCOUNTER — Ambulatory Visit (INDEPENDENT_AMBULATORY_CARE_PROVIDER_SITE_OTHER): Payer: No Typology Code available for payment source | Admitting: Primary Care

## 2021-02-25 VITALS — BP 102/60 | HR 100 | Ht 63.0 in

## 2021-02-25 DIAGNOSIS — M2550 Pain in unspecified joint: Secondary | ICD-10-CM | POA: Insufficient documentation

## 2021-02-25 DIAGNOSIS — Z8739 Personal history of other diseases of the musculoskeletal system and connective tissue: Secondary | ICD-10-CM

## 2021-02-25 DIAGNOSIS — G8929 Other chronic pain: Secondary | ICD-10-CM

## 2021-02-25 DIAGNOSIS — M25561 Pain in right knee: Secondary | ICD-10-CM | POA: Diagnosis not present

## 2021-02-25 DIAGNOSIS — N3946 Mixed incontinence: Secondary | ICD-10-CM

## 2021-02-25 DIAGNOSIS — Z3201 Encounter for pregnancy test, result positive: Secondary | ICD-10-CM | POA: Insufficient documentation

## 2021-02-25 DIAGNOSIS — Z0000 Encounter for general adult medical examination without abnormal findings: Secondary | ICD-10-CM | POA: Insufficient documentation

## 2021-02-25 LAB — POCT URINE PREGNANCY: Preg Test, Ur: POSITIVE — AB

## 2021-02-25 NOTE — Assessment & Plan Note (Signed)
Chronic, stable, intermittent.  No concerns today.

## 2021-02-25 NOTE — Assessment & Plan Note (Signed)
Immunizations UTD, she will provide Covid vaccine cards. Pap smear UTD, will get records from prior GYN.  Encouraged a healthy diet, regular exercise.  Exam today unremarkable. Labs pending for later this month.

## 2021-02-25 NOTE — Assessment & Plan Note (Signed)
Resolved post partum three months in 2020.

## 2021-02-25 NOTE — Assessment & Plan Note (Signed)
Chronic, improving slightly with PT. Continue regular PT.

## 2021-02-25 NOTE — Assessment & Plan Note (Signed)
LMP 7 weeks ago, positive home pregnancy test, positive test today. She is established with OB and will schedule an appointment.   Continue prenatal vitamins.

## 2021-02-25 NOTE — Progress Notes (Signed)
Subjective:    Patient ID: Lisa Brady, female    DOB: 01/06/1986, 35 y.o.   MRN: [id number]  HPI  Lisa Brady is a very pleasant 35 y.o. female who presents today to transfer care from [facility] and for CPE.  Immunizations: -Tetanus: Completed in 2020 -Influenza: Completed this season  -Covid-19: Completed three doses  Diet: She endorses a healthy diet.  Exercise: She is walking some  Eye exam: Due Dental exam: Completes semi-annually   Pap Smear: Completed in 2019, Follows with [facility]    Review of Systems  Constitutional: Negative for unexpected weight change.  HENT: Negative for rhinorrhea.   Respiratory: Negative for cough and shortness of breath.   Cardiovascular: Negative for chest pain.  Gastrointestinal: Negative for constipation and diarrhea.  Genitourinary: Negative for menstrual problem.       Chronic urinary incontinence   Musculoskeletal: Positive for arthralgias.  Skin: Negative for rash.  Allergic/Immunologic: Negative for environmental allergies.  Neurological: Negative for dizziness, numbness and headaches.  Psychiatric/Behavioral: The patient is not nervous/anxious.          Past Medical History:  Diagnosis Date  . Foot drop   . Hypermobility arthralgia   . Scoliosis   . Urinary incontinence     Social History   Socioeconomic History  . Marital status: Married    Spouse name: [name]  . Number of children: 1  . Years of education: Not on file  . Highest education level: Not on file  Occupational History  . Not on file  Tobacco Use  . Smoking status: Never Smoker  . Smokeless tobacco: Never Used  Vaping Use  . Vaping Use: Never used  Substance and Sexual Activity  . Alcohol use: Not Currently  . Drug use: Never  . Sexual activity: Not Currently  Other Topics Concern  . Not on file  Social History Narrative   [name] born [birth year]   Social Determinants of Health   Financial Resource Strain: Not on file  Food  Insecurity: Not on file  Transportation Needs: Not on file  Physical Activity: Not on file  Stress: Not on file  Social Connections: Not on file  Intimate Partner Violence: Not on file    Past Surgical History:  Procedure Laterality Date  . WISDOM TOOTH EXTRACTION  2006    Family History  Problem Relation Age of Onset  . Breast cancer Mother 45  . Melanoma Mother   . Hyperlipidemia Father   . Hypertension Father   . Hyperlipidemia Brother   . Brain cancer Maternal Grandmother   . Heart disease Maternal Grandfather   . Lung cancer Paternal Grandmother   . Lung cancer Paternal Grandfather   . Hyperlipidemia Brother   . Hyperlipidemia Brother   . Hyperlipidemia Brother     No Known Allergies  Current Outpatient Medications on File Prior to Visit  Medication Sig Dispense Refill  . Prenatal Vit-Fe Fumarate-FA (PRENATAL MULTIVITAMIN) TABS tablet Take 1 tablet by mouth daily at 12 noon. 90 tablet 3   No current facility-administered medications on file prior to visit.    BP 102/60   Pulse 100   Ht 5\' 3"  (1.6 m)   SpO2 99%   BMI 20.44 kg/m  Objective:   Physical Exam HENT:     Right Ear: Tympanic membrane and ear canal normal.     Left Ear: Tympanic membrane and ear canal normal.     Nose: Nose normal.  Eyes:     Conjunctiva/sclera:  Conjunctivae normal.     Pupils: Pupils are equal, round, and reactive to light.  Neck:     Thyroid: No thyromegaly.  Cardiovascular:     Rate and Rhythm: Normal rate and regular rhythm.     Heart sounds: No murmur heard.   Pulmonary:     Effort: Pulmonary effort is normal.     Breath sounds: Normal breath sounds. No rales.  Abdominal:     General: Bowel sounds are normal.     Palpations: Abdomen is soft.     Tenderness: There is no abdominal tenderness.  Musculoskeletal:        General: Normal range of motion.     Cervical back: Neck supple.  Lymphadenopathy:     Cervical: No cervical adenopathy.  Skin:    General: Skin is  warm and dry.     Findings: No rash.  Neurological:     Mental Status: She is alert and oriented to person, place, and time.     Cranial Nerves: No cranial nerve deficit.     Deep Tendon Reflexes: Reflexes are normal and symmetric.  Psychiatric:        Mood and Affect: Mood normal.           Assessment & Plan:      This visit occurred during the SARS-CoV-2 public health emergency.  Safety protocols were in place, including screening questions prior to the visit, additional usage of staff PPE, and extensive cleaning of exam room while observing appropriate contact time as indicated for disinfecting solutions.

## 2021-02-25 NOTE — Assessment & Plan Note (Signed)
Follows with physical therapy, is slowly progressing overall. Now wearing shoe lift.   Continue every other week sessions.

## 2021-02-25 NOTE — Patient Instructions (Signed)
Continue to work on a healthy diet. Ensure you are consuming 64 ounces of water daily.  Continue exercising. You should be getting 150 minutes of exercise weekly.  It was a pleasure to see you today!   Preventive Care 44-37 Years Old, Female Preventive care refers to lifestyle choices and visits with your health care provider that can promote health and wellness. This includes:  A yearly physical exam. This is also called an annual wellness visit.  Regular dental and eye exams.  Immunizations.  Screening for certain conditions.  Healthy lifestyle choices, such as: ? Eating a healthy diet. ? Getting regular exercise. ? Not using drugs or products that contain nicotine and tobacco. ? Limiting alcohol use. What can I expect for my preventive care visit? Physical exam Your health care provider may check your:  Height and weight. These may be used to calculate your BMI (body mass index). BMI is a measurement that tells if you are at a healthy weight.  Heart rate and blood pressure.  Body temperature.  Skin for abnormal spots. Counseling Your health care provider may ask you questions about your:  Past medical problems.  Family's medical history.  Alcohol, tobacco, and drug use.  Emotional well-being.  Home life and relationship well-being.  Sexual activity.  Diet, exercise, and sleep habits.  Work and work Statistician.  Access to firearms.  Method of birth control.  Menstrual cycle.  Pregnancy history. What immunizations do I need? Vaccines are usually given at various ages, according to a schedule. Your health care provider will recommend vaccines for you based on your age, medical history, and lifestyle or other factors, such as travel or where you work.   What tests do I need? Blood tests  Lipid and cholesterol levels. These may be checked every 5 years starting at age 37.  Hepatitis C test.  Hepatitis B test. Screening  Diabetes screening. This  is done by checking your blood sugar (glucose) after you have not eaten for a while (fasting).  STD (sexually transmitted disease) testing, if you are at risk.  BRCA-related cancer screening. This may be done if you have a family history of breast, ovarian, tubal, or peritoneal cancers.  Pelvic exam and Pap test. This may be done every 3 years starting at age 37. Starting at age 37, this may be done every 5 years if you have a Pap test in combination with an HPV test. Talk with your health care provider about your test results, treatment options, and if necessary, the need for more tests.   Follow these instructions at home: Eating and drinking  Eat a healthy diet that includes fresh fruits and vegetables, whole grains, lean protein, and low-fat dairy products.  Take vitamin and mineral supplements as recommended by your health care provider.  Do not drink alcohol if: ? Your health care provider tells you not to drink. ? You are pregnant, may be pregnant, or are planning to become pregnant.  If you drink alcohol: ? Limit how much you have to 0-1 drink a day. ? Be aware of how much alcohol is in your drink. In the [place] one drink equals one 12 oz bottle of beer (355 mL), one 5 oz glass of wine (148 mL), or one 1 oz glass of hard liquor (44 mL).   Lifestyle  Take daily care of your teeth and gums. Brush your teeth every morning and night with fluoride toothpaste. Floss one time each day.  Stay active. Exercise for at least  30 minutes 5 or more days each week.  Do not use any products that contain nicotine or tobacco, such as cigarettes, e-cigarettes, and chewing tobacco. If you need help quitting, ask your health care provider.  Do not use drugs.  If you are sexually active, practice safe sex. Use a condom or other form of protection to prevent STIs (sexually transmitted infections).  If you do not wish to become pregnant, use a form of birth control. If you plan to become pregnant,  see your health care provider for a prepregnancy visit.  Find healthy ways to cope with stress, such as: ? Meditation, yoga, or listening to music. ? Journaling. ? Talking to a trusted person. ? Spending time with friends and family. Safety  Always wear your seat belt while driving or riding in a vehicle.  Do not drive: ? If you have been drinking alcohol. Do not ride with someone who has been drinking. ? When you are tired or distracted. ? While texting.  Wear a helmet and other protective equipment during sports activities.  If you have firearms in your house, make sure you follow all gun safety procedures.  Seek help if you have been physically or sexually abused. What's next?  Go to your health care provider once a year for an annual wellness visit.  Ask your health care provider how often you should have your eyes and teeth checked.  Stay up to date on all vaccines. This information is not intended to replace advice given to you by your health care provider. Make sure you discuss any questions you have with your health care provider. Document Revised: 07/20/2020 Document Reviewed: 08/03/2018 Elsevier Patient Education  2021 [employer].

## 2021-03-04 ENCOUNTER — Ambulatory Visit: Payer: No Typology Code available for payment source | Admitting: Dermatology

## 2021-03-06 ENCOUNTER — Other Ambulatory Visit: Payer: Self-pay

## 2021-03-06 ENCOUNTER — Ambulatory Visit: Payer: No Typology Code available for payment source | Attending: Obstetrics and Gynecology

## 2021-03-06 DIAGNOSIS — M533 Sacrococcygeal disorders, not elsewhere classified: Secondary | ICD-10-CM | POA: Insufficient documentation

## 2021-03-06 DIAGNOSIS — M62838 Other muscle spasm: Secondary | ICD-10-CM | POA: Diagnosis not present

## 2021-03-06 DIAGNOSIS — M21372 Foot drop, left foot: Secondary | ICD-10-CM | POA: Diagnosis present

## 2021-03-06 NOTE — Therapy (Signed)
[place] MAIN [facility] SERVICES [address] [place], [place], [address] Phone: [phone]   Fax:  [phone]  Physical Therapy Treatment  The patient has been informed of current processes in place at Outpatient Rehab to protect patients from Covid-19 exposure including social distancing, schedule modifications, and new cleaning procedures. After discussing their particular risk with a therapist based on the patient's personal risk factors, the patient has decided to proceed with in-person therapy.  Patient Details  Name: Lisa Brady MRN: [id number] Date of Birth: 1986/02/06 No data recorded  Encounter Date: 03/06/2021   PT End of Session - 03/06/21 1542    Visit Number 47    Number of Visits 23    Date for PT Re-Evaluation 12/26/20    Authorization Type Kyle    Authorization - Visit Number 6    Authorization - Number of Visits 10    Progress Note Due on Visit 10    PT Start Time 0733    PT Stop Time 0833    PT Time Calculation (min) 60 min    Activity Tolerance Patient tolerated treatment well;No increased pain    Behavior During Therapy WFL for tasks assessed/performed           Past Medical History:  Diagnosis Date  . Foot drop   . Hypermobility arthralgia   . Maternal varicella, non-immune 11/20/2018  . Scoliosis   . Urinary incontinence     Past Surgical History:  Procedure Laterality Date  . WISDOM TOOTH EXTRACTION  2006    There were no vitals filed for this visit.  Pelvic Floor Physical Therapy Treatment Note  SCREENING  Changes in medications, allergies, or medical history?: none    SUBJECTIVE  Patient reports: She has a cold and the first day she had less leakage but since then she has had leakage every time she coughs/sneezes. She has a hard time "mustering" a kegel even before she coughs. She has less leakage when she is sitting. She is pregnant so she has been having some nausea/vomiting. She  has not been doing her PT a lot lately has been hit-or-miss.   Precautions:  Hypermobility and Scoliosis  Pain update:  Location of pain:  Low back hip/side (neck) Current pain:  1-2/10 (0/10 with turning) Max pain:  2/10  (3/10) Least pain:  0/10 (0/10) Nature of pain: tightness/achy  **no increased pain following treatment   Patient Goals: Control her bladder/not need depends.   OBJECTIVE  Changes in:   Observation:  Slight R anterior innominate rotation (from previous session)  R scapula protracted and winging>L (from prior session)  12/26/20: alignment not assessed before MMT performed, audble "pop" when performing MMT for L adduction and then pelvis assessed and appropriately aligned in supine.  01/09/21: Pt. Demonstrates R anterior rotation, L up-slip and LLD with LLE long was finally detected. **PSIS appear level in standing with 2/3 heel-lift in R shoe.  TODAY mild R anterior rotation, resolved following treatment.  ROM/Mobility:  Decreased scapular depression and downward rotation ROM in R>L scapula. Decreased L cervical rotation with painful end-feel. Decreased mobility at pubic symphysis and pain with pressure at L Pubic ramus  (from prior session)  Pt demonstrates decreased upward rotation ROM for R scapula due to spasm/ fascial tightness that is limiting proprioception and recruitment for effective downward scapular rotation strengthening. (from previous session)   Strength:  Muscle fatigue reached earlier on R>L with tall-kneeling/balance exercises. Imbalance surrounding R scapula not allowing  Pt. To perform I's, Y's, T's W's well. (from prior session)  MMT LE  - L DF 4+/5 (different rater may account for difference but could be due to spasms in back since test performed prior to DN/manual) - R DF 5/5 (from prior session)  Strength/MMT:  LE MMT (12/26/20) LE MMT Left Right  Hip flex:  (L2) /5 /5  Hip ext: 5/5 5/5  Hip abd: 4+/5 5/5  Hip add: 4+/5 5/5   Hip IR 4/5 4+/5  Hip ER 5/5 5/5  *audible "pop" with mild discomfort noted with resisted L adduction.  (02-06-21) LE MMT Left Right  Hip flex:  (L2) /5 /5  Hip ext: -5/5 5/5  Hip abd: 5/5 5/5  Hip add: 4+/5 4/5!  Hip IR 4+/5 4+/5  Hip ER 4++/5 4++/5    Deep-core strength/endurance sufficient to allow Pt. To perform toe-taps x5 in a row before rest needed.  Pelvic floor: TTP through R posterior PR/PC and OI as well as 5 o'clock region of posterior fourchette. TTP to to L posterior fourchette at ~ 5 o'clock. Coordinated and able to keep probe in even at ~ 45 degrees. Increased force production when TA and obliques recruited. ~ 3+/5 strength and maximal endurance at ~ 8 seconds.  (from prior session)  1/7:  Strength ~ 2+/5 initially with TTP to anterior and posterior PR/PC as well as decreased fascial mobility along anterior/inferior aspect of bladder.  **following treatment Pt. Able to achieve 3++/5 with decreased recruitment of the posterior PR/PC and IC near coccyx remaining. (from previous session)  01/09/21: TTP to R>L coccygeus, OI, and IC near coccyx. Strength ~ 3/5 and 3 sec. endurance with poor recruitment of posterior PFM.  **following treatment, strength ~ 3++/5 with much more squeeze from posterior PFM and ~ 8 sec. Hold.  TODAY: not re-assessed today.  Palpation:  Greatest tenderness with pressure on spinous processes T5 and L2. (from previous session)  02/06/21: TTP to R TFL, iliopsoas, vastus lateralis and rectus femoris  TODAY: TTP to R Iliacus, Psoas, TFL, and vastus lateralis.  Gait Analysis: (from prior session) Gait speed: 1.68ms ; anterior pelvic tilt, knee hyperextension, heel strike - with cues pelvic neutral achieved, with appropriate arm swing and decreased heel strike.     INTERVENTIONS THIS SESSION: Manual: re-assessed TP release to R Iliacus, Psoas, TFL, and vastus lateralis followed by MET correction for R anterior rotation x2  And MFR along the R  anterior thigh to decrease spasm and pain and allow for improved balance of musculature for improved function and decreased symptoms.  Therex: Reviewed MET correction and instructed to perform MET fro R anterior rotation if she starts to have a hard time getting the PFM to recruit again and instructed to do her bridges with single-leg holds on the R for 5 sec. For one of her sets of bridges.   Total time: 60 min.                               PT Short Term Goals - 06/13/20 0904      PT SHORT TERM GOAL #1   Title Patient will demonstrate improved pelvic alignment and balance of musculature surrounding the pelvis to facilitate decreased PFM spasms and decrease pressure on nerve roots to allow for optimal PFM function    Baseline Has L foot-drop and pain in L glutes/knee as well as inability to sense PFM and L posterior innominate rotation/spasms  surrounding pelvis.    Time 5    Period Weeks    Status Achieved    Target Date 11/26/19      PT SHORT TERM GOAL #2   Title Patient will demonstrate HEP x1 in the clinic to demonstrate understanding and proper form to allow for further improvement.    Baseline Pt. lacks knowledge of therapeutic ecersise that will decrease Sx.    Time 5    Period Weeks    Status Achieved    Target Date 11/26/19      PT SHORT TERM GOAL #3   Title Pt. will be able to walk 250 Ft.with CGA and w/o assistive device to demonstrate improved balance/decreased instability and foot drop.    Baseline Pt. using RW to AMB due to L foot-drop and increased instability.    Time 5    Period Weeks    Status Achieved    Target Date 08/20/19      PT SHORT TERM GOAL #4   Title Patient will demonstrate a coordinated contraction, relaxation, and bulge of the pelvic floor muscles to demonstrate functional recruitment and motion and allow for further strengthening.    Baseline Pt. reports inability to sense the PFM for kegel or relaxation, having mixed UI     Time 5    Period Weeks    Status Achieved    Target Date 08/20/19             PT Long Term Goals - 10/17/20 0740      PT LONG TERM GOAL #1   Title Patient will report no episodes of UUI/SUI over the course of the prior two weeks to demonstrate improved functional ability.    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 As of 4/30: She had been doing better but then took a step backward when she was sick and vomiting a lot. As of 7/9: Pt. continues to have decreased incontinence but had an episode of total bladder loss and UUI ~ 1-2 times/day still (worsened due to overuse/spasm of LB musculature. As of 11/12: She had one "big accident" and one "snexe-pee" accident over the week.    Time 10    Period Weeks    Status On-going    Target Date 12/26/20      PT LONG TERM GOAL #2   Title Patient will report no pain with intercourse to demonstrate improved functional ability.    Baseline having mild pain with initial penetration As of 1:29: had some pain at initial penetration and with deeper thrusting at ~ 3-4/10 but "not terrible" 10/17/20: has not attempted to know    Time 10    Period Weeks    Status On-going    Target Date 12/26/20      PT LONG TERM GOAL #3   Title Patient will score at or below 22/100 on the UDI and 27/100 on the UIQ to demonstrate a clinically meaningful decrease in disability and distress due to pelvic floor dysfunction.    Baseline UDI: 67, UIQ: 72/100 10-19-19: UDI 55.5, UIQ 76/100; POPDI 11/100 As of 1/29: UDI:33/100, UIQ: 6/100, POPDI:6/100. As of 4/30: Due to set-back from vomiting. As of 11/12: UDI: 17/100, UIQ: POPDI: 11/100    Time 10    Period Weeks    Status Achieved    Target Date 08/22/20      PT LONG TERM GOAL #4  Title Pt. will demonstrate appropriate gait mechanics and be able to perform ADL's without use of AD and without pain to allow for child-care  and return to work.    Baseline Pt. requiring RW for AMB due to L foot-drop and imbalance. As of 10/12: Pt. requiring some support (e.g. grocery cart) for > 20 min. of AMB or stairs.    Time 10    Period Weeks    Status Achieved    Target Date 11/25/20      PT LONG TERM GOAL #5   Title Pt. will improve in FOTO score by 10 points to demonstrate improved function.    Baseline FOTO PFDI Urinary: 29, Urinary Problem:54    Time 10    Period Weeks    Status New    Target Date 12/26/20      Additional Long Term Goals   Additional Long Term Goals Yes      PT LONG TERM GOAL #6   Title Patient will demonstrate 4+/5 strength or better in the PF and surrounding musculature to show improved functional strength for childcare and other vocational and recreational activities.    Baseline PFM 3/5    Time 10    Period Weeks    Status New    Target Date 12/26/20                 Plan - 03/06/21 1543    Clinical Impression Statement Pt. Responded well to all interventions today, demonstrating improved pelvic alignment and decreased spasm/TTP, as well as understanding and correct performance of all education and exercises provided today. They will continue to benefit from skilled physical therapy to work toward remaining goals and maximize function as well as decrease likelihood of symptom increase or recurrence.    PT Next Visit Plan re-assess pelvic alignment with heel-lift in place. discuss plan for strengthening and walking program, stabilize gait pattern, discuss benefits of aquatic exercise. Review toe-taps, re-assess PFM, TPDN to R posterior scapular stabilizers and spinal mobilization/ rotational mobs into R rotation and perform PNF for the scapula and rainbows on the Re-assess rib mobility and obliques/diaphragm, review deep-core coordination (tall kneel, knee step through, stepping, lean-backs); re-assess for Piriformis spasm (have performed DN and TP release now); check in with long term  HEP performance    PT Home Exercise Plan A day: Teapot, Side-planks, Planks, Dorothy's, scapular rows and I's, Y's, T's, and W's (temp. rows,sacp retractions, and stretch levator scap on R. Door stretch,B [id number] with leg lift Child's pose V-sit/ single leg hamstring stretch, side-lying hip ABD and prone hip EXT. Side stretch, modified bridge, Piriformis stretch, frog stretch Every day: Kegels 3-5x/day with long-hold and pelvic tilt with sneeze, laugh/cough kegel, vaginal weights, tall kneeling, tall-kneeling step-through, lean-backs, Corrective: Upper cervical rotationTennis ball releaseadded MET for R anterior for 1 week. Heel-lift for RLE. Pilates videos.    Consulted and Agree with Plan of Care Patient           Patient will benefit from skilled therapeutic intervention in order to improve the following deficits and impairments:     Visit Diagnosis: Other muscle spasm  Foot drop, left  Sacrococcygeal disorders, not elsewhere classified     Problem List Patient Active Problem List   Diagnosis Date Noted  . Hypermobility arthralgia 02/25/2021  . Preventative health care 02/25/2021  . Positive urine pregnancy test 02/25/2021  . Chronic pain of right knee 09/17/2020  . Urinary incontinence 08/17/2019  . History of left foot drop 08/17/2019  Willa Rough DPT, ATC Willa Rough 03/06/2021, 3:47 PM  [place] MAIN [facility] SERVICES [address] [place], [place], [address] Phone: [phone]   Fax:  [phone]  Name: Lisa Brady MRN: [id number] Date of Birth: 10/17/1986

## 2021-03-11 ENCOUNTER — Ambulatory Visit (INDEPENDENT_AMBULATORY_CARE_PROVIDER_SITE_OTHER): Payer: No Typology Code available for payment source | Admitting: Certified Nurse Midwife

## 2021-03-11 ENCOUNTER — Other Ambulatory Visit: Payer: Self-pay

## 2021-03-11 ENCOUNTER — Encounter: Payer: Self-pay | Admitting: Certified Nurse Midwife

## 2021-03-11 ENCOUNTER — Encounter: Payer: Self-pay | Admitting: Internal Medicine

## 2021-03-11 VITALS — BP 117/80 | HR 99

## 2021-03-11 DIAGNOSIS — Z113 Encounter for screening for infections with a predominantly sexual mode of transmission: Secondary | ICD-10-CM

## 2021-03-11 DIAGNOSIS — Z3491 Encounter for supervision of normal pregnancy, unspecified, first trimester: Secondary | ICD-10-CM | POA: Diagnosis not present

## 2021-03-11 NOTE — Progress Notes (Signed)
Subjective:    Lisa Brady is a 35 y.o. female who presents for evaluation of amenorrhea. She believes she could be pregnant. Pregnancy is desired. Sexual Activity: none. Current symptoms also include: fatigue. Last period was normal.   No LMP recorded. Patient is pregnant. The following portions of the patient's history were reviewed and updated as appropriate: allergies, current medications, past family history, past medical history, past social history, past surgical history and problem list.  Review of Systems Pertinent items are noted in HPI.     Objective:    BP 117/80   Pulse 99  General: alert, cooperative, appears stated age, fatigued and no acute distress    Lab Review Urine HCG: positive    Assessment:    Absence of menstruation.     Plan:  Positive: EDC: 10/13/2021. Briefly discussed pre-natal care options. Midwifery and MD care reviewed. Plans to see midwives.. Encouraged well-balanced diet, plenty of rest when needed, pre-natal vitamins daily and walking for exercise. Discussed self-help for nausea, avoiding OTC medications until consulting provider or pharmacist, other than Tylenol as needed, minimal caffeine (1-2 cups daily) and avoiding alcohol. She will schedule her initial OB physical in 3 wk.. Feel free to call with any questions.   Philip Aspen, CNM

## 2021-03-11 NOTE — Patient Instructions (Signed)
https://www.acog.org/womens-health/faqs/prenatal-genetic-screening-tests">  Prenatal Care Prenatal care is health care during pregnancy. It helps you and your unborn baby (fetus) stay as healthy as possible. Prenatal care may be provided by a midwife, a family practice doctor, a IT consultant (nurse practitioner or physician assistant), or a childbirth and pregnancy doctor (obstetrician). How does this affect me? During pregnancy, you will be closely monitored for any new conditions that might develop. To lower your risk of pregnancy complications, you and your health care provider will talk about any underlying conditions you have. How does this affect my baby? Early and consistent prenatal care increases the chance that your baby will be healthy during pregnancy. Prenatal care lowers the risk that your baby will be:  Born early (prematurely).  Smaller than expected at birth (small for gestational age). What can I expect at the first prenatal care visit? Your first prenatal care visit will likely be the longest. You should schedule your first prenatal care visit as soon as you know that you are pregnant. Your first visit is a good time to talk about any questions or concerns you have about pregnancy. Medical history At your visit, you and your health care provider will talk about your medical history, including:  Any past pregnancies.  Your family's medical history.  Medical history of the baby's father.  Any long-term (chronic) health conditions you have and how you manage them.  Any surgeries or procedures you have had.  Any current over-the-counter or prescription medicines, herbs, or supplements that you are taking.  Other factors that could pose a risk to your baby, including: ? Exposure to harmful chemicals or radiation at work or at home. ? Any substance use, including tobacco, alcohol, and drug use.  Your home setting and your stress levels, including: ? Exposure to  abuse or violence. ? Household financial strain.  Your daily health habits, including diet and exercise. Tests and screenings Your health care provider will:  Measure your weight, height, and blood pressure.  Do a physical exam, including a pelvic and breast exam.  Perform blood tests and urine tests to check for: ? Urinary tract infection. ? Sexually transmitted infections (STIs). ? Low iron levels in your blood (anemia). ? Blood type and certain proteins on red blood cells (Rh antibodies). ? Infections and immunity to viruses, such as hepatitis B and rubella. ? HIV (human immunodeficiency virus).  Discuss your options for genetic screening. Tips about staying healthy Your health care provider will also give you information about how to keep yourself and your baby healthy, including:  Nutrition and taking vitamins.  Physical activity.  How to manage pregnancy symptoms such as nausea and vomiting (morning sickness).  Infections and substances that may be harmful to your baby and how to avoid them.  Food safety.  Dental care.  Working.  Travel.  Warning signs to watch for and when to call your health care provider. How often will I have prenatal care visits? After your first prenatal care visit, you will have regular visits throughout your pregnancy. The visit schedule is often as follows:  Up to week 28 of pregnancy: once every 4 weeks.  28-36 weeks: once every 2 weeks.  After 36 weeks: every week until delivery. Some women may have visits more or less often depending on any underlying health conditions and the health of the baby. Keep all follow-up and prenatal care visits. This is important. What happens during routine prenatal care visits? Your health care provider will:  Measure your weight  and blood pressure.  Check for fetal heart sounds.  Measure the height of your uterus in your abdomen (fundal height). This may be measured starting around week 20 of  pregnancy.  Check the position of your baby inside your uterus.  Ask questions about your diet, sleeping patterns, and whether you can feel the baby move.  Review warning signs to watch for and signs of labor.  Ask about any pregnancy symptoms you are having and how you are dealing with them. Symptoms may include: ? Headaches. ? Nausea and vomiting. ? Vaginal discharge. ? Swelling. ? Fatigue. ? Constipation. ? Changes in your vision. ? Feeling persistently sad or anxious. ? Any discomfort, including back or pelvic pain. ? Bleeding or spotting. Make a list of questions to ask your health care provider at your routine visits.   What tests might I have during prenatal care visits? You may have blood, urine, and imaging tests throughout your pregnancy, such as:  Urine tests to check for glucose, protein, or signs of infection.  Glucose tests to check for a form of diabetes that can develop during pregnancy (gestational diabetes mellitus). This is usually done around week 24 of pregnancy.  Ultrasounds to check your baby's growth and development, to check for birth defects, and to check your baby's well-being. These can also help to decide when you should deliver your baby.  A test to check for group B strep (GBS) infection. This is usually done around week 36 of pregnancy.  Genetic testing. This may include blood, fluid, or tissue sampling, or imaging tests, such as an ultrasound. Some genetic tests are done during the first trimester and some are done during the second trimester. What else can I expect during prenatal care visits? Your health care provider may recommend getting certain vaccines during pregnancy. These may include:  A yearly flu shot (annual influenza vaccine). This is especially important if you will be pregnant during flu season.  Tdap (tetanus, diphtheria, pertussis) vaccine. Getting this vaccine during pregnancy can protect your baby from whooping cough  (pertussis) after birth. This vaccine may be recommended between weeks 27 and 36 of pregnancy.  A COVID-19 vaccine. Later in your pregnancy, your health care provider may give you information about:  Childbirth and breastfeeding classes.  Choosing a health care provider for your baby.  Umbilical cord banking.  Breastfeeding.  Birth control after your baby is born.  The hospital labor and delivery unit and how to set up a tour.  Registering at the hospital before you go into labor. Where to find more information  Office on Women's Health: LegalWarrants.gl  American Pregnancy Association: americanpregnancy.org  March of Dimes: marchofdimes.org Summary  Prenatal care helps you and your baby stay as healthy as possible during pregnancy.  Your first prenatal care visit will most likely be the longest.  You will have visits and tests throughout your pregnancy to monitor your health and your baby's health.  Bring a list of questions to your visits to ask your health care provider.  Make sure to keep all follow-up and prenatal care visits. This information is not intended to replace advice given to you by your health care provider. Make sure you discuss any questions you have with your health care provider. Document Revised: 09/04/2020 Document Reviewed: 09/04/2020 Elsevier Patient Education  [place].

## 2021-03-12 LAB — VIRAL HEPATITIS HBV, HCV
HCV Ab: <0.1 s/co ratio (ref 0.0–0.9)
Hep B Core Total Ab: NEGATIVE
Hep B Surface Ab, Qual: NONREACTIVE
Hepatitis B Surface Ag: NEGATIVE

## 2021-03-12 LAB — RUBELLA SCREEN: Rubella Antibodies, IGG: 1.05 index (ref >0.99)

## 2021-03-12 LAB — URINALYSIS, ROUTINE W REFLEX MICROSCOPIC

## 2021-03-12 LAB — ANTIBODY SCREEN: Antibody Screen: NEGATIVE

## 2021-03-12 LAB — HCV INTERPRETATION

## 2021-03-12 LAB — HIV ANTIBODY (ROUTINE TESTING W REFLEX): HIV Screen 4th Generation wRfx: NONREACTIVE

## 2021-03-12 LAB — HGB SOLU + RFLX FRAC: Sickle Solubility Test - HGBRFX: NEGATIVE

## 2021-03-12 LAB — RPR: RPR Ser Ql: NONREACTIVE

## 2021-03-12 LAB — ABO AND RH: Rh Factor: POSITIVE

## 2021-03-12 LAB — VARICELLA ZOSTER ANTIBODY, IGG: Varicella zoster IgG: <135 index — ABNORMAL LOW (ref >165)

## 2021-03-13 LAB — MONITOR DRUG PROFILE 14(MW)
Amphetamine Scrn, Ur: NEGATIVE ng/mL
BARBITURATE SCREEN URINE: NEGATIVE ng/mL
BENZODIAZEPINE SCREEN, URINE: NEGATIVE ng/mL
Buprenorphine, Urine: NEGATIVE ng/mL
CANNABINOIDS UR QL SCN: NEGATIVE ng/mL
Cocaine (Metab) Scrn, Ur: NEGATIVE ng/mL
Creatinine(Crt), U: 36.6 mg/dL (ref 20.0–300.0)
Fentanyl, Urine: NEGATIVE pg/mL
Meperidine Screen, Urine: NEGATIVE ng/mL
Methadone Screen, Urine: NEGATIVE ng/mL
OXYCODONE+OXYMORPHONE UR QL SCN: NEGATIVE ng/mL
Opiate Scrn, Ur: NEGATIVE ng/mL
Ph of Urine: 7.4 (ref 4.5–8.9)
Phencyclidine Qn, Ur: NEGATIVE ng/mL
Propoxyphene Scrn, Ur: NEGATIVE ng/mL
SPECIFIC GRAVITY: 1.007
Tramadol Screen, Urine: NEGATIVE ng/mL

## 2021-03-13 LAB — GC/CHLAMYDIA PROBE AMP
Chlamydia trachomatis, NAA: NEGATIVE
Neisseria Gonorrhoeae by PCR: NEGATIVE

## 2021-03-13 LAB — CULTURE, OB URINE

## 2021-03-13 LAB — URINE CULTURE, OB REFLEX

## 2021-03-20 ENCOUNTER — Ambulatory Visit: Payer: No Typology Code available for payment source

## 2021-03-20 ENCOUNTER — Other Ambulatory Visit: Payer: Self-pay

## 2021-03-20 DIAGNOSIS — M62838 Other muscle spasm: Secondary | ICD-10-CM

## 2021-03-20 DIAGNOSIS — M21372 Foot drop, left foot: Secondary | ICD-10-CM

## 2021-03-20 DIAGNOSIS — M533 Sacrococcygeal disorders, not elsewhere classified: Secondary | ICD-10-CM

## 2021-03-20 NOTE — Therapy (Signed)
[place] MAIN [facility] SERVICES [address] [place], [place], [address] Phone: [phone]   Fax:  [phone]  Physical Therapy Treatment  The patient has been informed of current processes in place at Outpatient Rehab to protect patients from Covid-19 exposure including social distancing, schedule modifications, and new cleaning procedures. After discussing their particular risk with a therapist based on the patient's personal risk factors, the patient has decided to proceed with in-person therapy.  Patient Details  Name: Lisa Brady MRN: [id number] Date of Birth: 09-Jul-1986 No data recorded  Encounter Date: 03/20/2021   PT End of Session - 03/20/21 0841    Visit Number 60    Number of Visits 65    Date for PT Re-Evaluation 12/26/20    Authorization Type [place]    Authorization - Visit Number 7    Authorization - Number of Visits 10    Progress Note Due on Visit 10    PT Start Time 0730    PT Stop Time 0830    PT Time Calculation (min) 60 min    Activity Tolerance Patient tolerated treatment well;No increased pain    Behavior During Therapy WFL for tasks assessed/performed           Past Medical History:  Diagnosis Date  . Foot drop   . Hypermobility arthralgia   . Maternal varicella, non-immune 11/20/2018  . Scoliosis   . Urinary incontinence     Past Surgical History:  Procedure Laterality Date  . WISDOM TOOTH EXTRACTION  2006    There were no vitals filed for this visit.  Pelvic Floor Physical Therapy Treatment Note  SCREENING  Changes in medications, allergies, or medical history?: pregnant    SUBJECTIVE  Patient reports: Her cold "broke everything" and she was having a lot of UI especially throughout the day. Since feeling better it is starting to get a little better. Most of the leakage was with coughing but some leakage after voiding. She noticed that if she crossed the L leg over the right when  lying on her side to sleep it would help her not have UI. Kegels are feeling very weak.  Precautions:  Hypermobility and Scoliosis  Pain update:  Location of pain:  Low back hip/side (neck) Current pain:  1-2/10 (0/10 with turning) Max pain:  2/10  (3/10) Least pain:  0/10 (0/10) Nature of pain: tightness/achy  **no increased pain following treatment   Patient Goals: Control her bladder/not need depends.   OBJECTIVE  Changes in:   Observation:  Slight R anterior innominate rotation (from previous session)  R scapula protracted and winging>L (from prior session)  12/26/20: alignment not assessed before MMT performed, audble "pop" when performing MMT for L adduction and then pelvis assessed and appropriately aligned in supine.  01/09/21: Pt. Demonstrates R anterior rotation, L up-slip and LLD with LLE long was finally detected. **PSIS appear level in standing with 2/3 heel-lift in R shoe.   03/06/21: mild R anterior rotation, resolved following treatment.  TODAY: L up-slip and R anterior/L posterior rotation, possible R out-flare.   **following treatment pubic symphysis level, up-slip corrected but R anterion rotation remaining.   ROM/Mobility:  Decreased scapular depression and downward rotation ROM in R>L scapula. Decreased L cervical rotation with painful end-feel. Decreased mobility at pubic symphysis and pain with pressure at L Pubic ramus  (from prior session)  Pt demonstrates decreased upward rotation ROM for R scapula due to spasm/ fascial tightness that is limiting  proprioception and recruitment for effective downward scapular rotation strengthening. (from previous session)   Strength:  Muscle fatigue reached earlier on R>L with tall-kneeling/balance exercises. Imbalance surrounding R scapula not allowing Pt. To perform I's, Y's, T's W's well. (from prior session)  MMT LE  - L DF 4+/5 (different rater may account for difference but could be due to spasms in back  since test performed prior to DN/manual) - R DF 5/5 (from prior session)  Strength/MMT:  LE MMT (12/26/20) LE MMT Left Right  Hip flex:  (L2) /5 /5  Hip ext: 5/5 5/5  Hip abd: 4+/5 5/5  Hip add: 4+/5 5/5  Hip IR 4/5 4+/5  Hip ER 5/5 5/5  *audible "pop" with mild discomfort noted with resisted L adduction.  (02-06-21) LE MMT Left Right  Hip flex:  (L2) /5 /5  Hip ext: -5/5 5/5  Hip abd: 5/5 5/5  Hip add: 4+/5 4/5!  Hip IR 4+/5 4+/5  Hip ER 4++/5 4++/5    Deep-core strength/endurance sufficient to allow Pt. To perform toe-taps x5 in a row before rest needed.  Pelvic floor: TTP through R posterior PR/PC and OI as well as 5 o'clock region of posterior fourchette. TTP to to L posterior fourchette at ~ 5 o'clock. Coordinated and able to keep probe in even at ~ 45 degrees. Increased force production when TA and obliques recruited. ~ 3+/5 strength and maximal endurance at ~ 8 seconds.  (from prior session)  1/7:  Strength ~ 2+/5 initially with TTP to anterior and posterior PR/PC as well as decreased fascial mobility along anterior/inferior aspect of bladder.  **following treatment Pt. Able to achieve 3++/5 with decreased recruitment of the posterior PR/PC and IC near coccyx remaining. (from previous session)  01/09/21: TTP to R>L coccygeus, OI, and IC near coccyx. Strength ~ 3/5 and 3 sec. endurance with poor recruitment of posterior PFM.  **following treatment, strength ~ 3++/5 with much more squeeze from posterior PFM and ~ 8 sec. Hold.  TODAY: not re-assessed today.  Palpation:  Greatest tenderness with pressure on spinous processes T5 and L2. (from previous session)  02/06/21: TTP to R TFL, iliopsoas, vastus lateralis and rectus femoris  03/06/21: TTP to R Iliacus, Psoas, TFL, and vastus lateralis.  TODAY:  TTP to R iliacus L QL, adductor brevis and pectineus  Gait Analysis: (from prior session) Gait speed: 1.54ms ; anterior pelvic tilt, knee hyperextension, heel strike - with cues  pelvic neutral achieved, with appropriate arm swing and decreased heel strike.     INTERVENTIONS THIS SESSION: Manual: Performed MET correction x2 followed by TP release to R iliacus L QL, adductor brevis and pectineus then L up-slip correction to improve pelvic alignment, and to decrease spasm and pain and allow for improved balance of musculature for improved function and decreased symptoms.   Self-care: discussed the impact of pregnancy on her hypermobility and potential use of SI belt to help stabilize the SIJ. Discussed the likelihood that she will be a candidate for surgical intervention following the delivery of her second child and educated on standing and then sitting again to try to empty the bladder fully due to prolapse to try and prevent leakage after urinating.  Total time: 60 min.                                PT Short Term Goals - 06/13/20 0904      PT SHORT TERM GOAL #  1   Title Patient will demonstrate improved pelvic alignment and balance of musculature surrounding the pelvis to facilitate decreased PFM spasms and decrease pressure on nerve roots to allow for optimal PFM function    Baseline Has L foot-drop and pain in L glutes/knee as well as inability to sense PFM and L posterior innominate rotation/spasms surrounding pelvis.    Time 5    Period Weeks    Status Achieved    Target Date 11/26/19      PT SHORT TERM GOAL #2   Title Patient will demonstrate HEP x1 in the clinic to demonstrate understanding and proper form to allow for further improvement.    Baseline Pt. lacks knowledge of therapeutic ecersise that will decrease Sx.    Time 5    Period Weeks    Status Achieved    Target Date 11/26/19      PT SHORT TERM GOAL #3   Title Pt. will be able to walk 250 Ft.with CGA and w/o assistive device to demonstrate improved balance/decreased instability and foot drop.    Baseline Pt. using RW to AMB due to L foot-drop and increased  instability.    Time 5    Period Weeks    Status Achieved    Target Date 08/20/19      PT SHORT TERM GOAL #4   Title Patient will demonstrate a coordinated contraction, relaxation, and bulge of the pelvic floor muscles to demonstrate functional recruitment and motion and allow for further strengthening.    Baseline Pt. reports inability to sense the PFM for kegel or relaxation, having mixed UI    Time 5    Period Weeks    Status Achieved    Target Date 08/20/19             PT Long Term Goals - 10/17/20 0740      PT LONG TERM GOAL #1   Title Patient will report no episodes of UUI/SUI over the course of the prior two weeks to demonstrate improved functional ability.    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 As of 4/30: She had been doing better but then took a step backward when she was sick and vomiting a lot. As of 7/9: Pt. continues to have decreased incontinence but had an episode of total bladder loss and UUI ~ 1-2 times/day still (worsened due to overuse/spasm of LB musculature. As of 11/12: She had one "big accident" and one "snexe-pee" accident over the week.    Time 10    Period Weeks    Status On-going    Target Date 12/26/20      PT LONG TERM GOAL #2   Title Patient will report no pain with intercourse to demonstrate improved functional ability.    Baseline having mild pain with initial penetration As of 1:29: had some pain at initial penetration and with deeper thrusting at ~ 3-4/10 but "not terrible" 10/17/20: has not attempted to know    Time 10    Period Weeks    Status On-going    Target Date 12/26/20      PT LONG TERM GOAL #3   Title Patient will score at or below 22/100 on the UDI and 27/100 on the UIQ to demonstrate a clinically meaningful decrease in disability and distress due to pelvic floor dysfunction.    Baseline UDI: 67, UIQ: 72/100 10-19-19: UDI 55.5,  UIQ  76/100; POPDI 11/100 As of 1/29: UDI:33/100, UIQ: 6/100, POPDI:6/100. As of 4/30: Due to set-back from vomiting. As of 11/12: UDI: 17/100, UIQ: POPDI: 11/100    Time 10    Period Weeks    Status Achieved    Target Date 08/22/20      PT LONG TERM GOAL #4   Title Pt. 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    Baseline Pt. requiring RW for AMB due to L foot-drop and imbalance. As of 10/12: Pt. requiring some support (e.g. grocery cart) for > 20 min. of AMB or stairs.    Time 10    Period Weeks    Status Achieved    Target Date 11/25/20      PT LONG TERM GOAL #5   Title Pt. will improve in FOTO score by 10 points to demonstrate improved function.    Baseline FOTO PFDI Urinary: 29, Urinary Problem:54    Time 10    Period Weeks    Status New    Target Date 12/26/20      Additional Long Term Goals   Additional Long Term Goals Yes      PT LONG TERM GOAL #6   Title Patient will demonstrate 4+/5 strength or better in the PF and surrounding musculature to show improved functional strength for childcare and other vocational and recreational activities.    Baseline PFM 3/5    Time 10    Period Weeks    Status New    Target Date 12/26/20                 Plan - 03/20/21 0841    Clinical Impression Statement Pt. Responded well to all interventions today, demonstrating improved pelvic alignment, decreased spasm, as well as understanding and correct performance of all education and exercises provided today. They will continue to benefit from skilled physical therapy to work toward remaining goals and maximize function as well as decrease likelihood of symptom increase or recurrence.    PT Next Visit Plan re-assess pelvic alignment with heel-lift in place. discuss plan for strengthening and walking program, stabilize gait pattern, discuss benefits of aquatic exercise. Review  toe-taps, re-assess PFM, TPDN to R posterior scapular stabilizers and spinal mobilization/ rotational mobs into R rotation and perform PNF for the scapula and rainbows on the Re-assess rib mobility and obliques/diaphragm, review deep-core coordination (tall kneel, knee step through, stepping, lean-backs); re-assess for Piriformis spasm (have performed DN and TP release now); check in with long term HEP performance    PT Home Exercise Plan A day: [place], Side-planks, Planks, Dorothy's, scapular rows and I's, Y's, T's, and W's (temp. rows,sacp retractions, and stretch levator scap on R. Door stretch,B [id number] with leg lift Child's pose V-sit/ single leg hamstring stretch, side-lying hip ABD and prone hip EXT. Side stretch, modified bridge, Piriformis stretch, frog stretch Every day: Kegels 3-5x/day with long-hold and pelvic tilt with sneeze, laugh/cough kegel, vaginal weights, tall kneeling, tall-kneeling step-through, lean-backs, Corrective: Upper cervical rotationTennis ball releaseadded MET for R anterior for 1 week. Heel-lift for RLE. Pilates videos.    Consulted and Agree with Plan of Care Patient           Patient will benefit from skilled therapeutic intervention in order to improve the following deficits and impairments:     Visit Diagnosis: Other muscle spasm  Foot drop, left  Sacrococcygeal disorders, not elsewhere classified     Problem List Patient Active Problem List   Diagnosis Date Noted  . Hypermobility arthralgia 02/25/2021  . Preventative health care 02/25/2021  . Positive urine pregnancy test 02/25/2021  . Chronic pain of right knee 09/17/2020  . Urinary incontinence 08/17/2019  . History of left foot drop 08/17/2019   Willa Rough DPT, ATC Willa Rough 03/20/2021, 8:53 AM  [place] MAIN [facility] SERVICES [address] [place], [place], [address] Phone: [phone]   Fax:  [phone]  Name: Lisa Brady MRN: [id number] Date of Birth: [birth year]

## 2021-04-01 ENCOUNTER — Other Ambulatory Visit: Payer: Self-pay

## 2021-04-01 ENCOUNTER — Ambulatory Visit: Payer: No Typology Code available for payment source | Admitting: Dermatology

## 2021-04-01 ENCOUNTER — Encounter: Payer: No Typology Code available for payment source | Admitting: Certified Nurse Midwife

## 2021-04-01 DIAGNOSIS — L578 Other skin changes due to chronic exposure to nonionizing radiation: Secondary | ICD-10-CM

## 2021-04-01 DIAGNOSIS — L814 Other melanin hyperpigmentation: Secondary | ICD-10-CM | POA: Diagnosis not present

## 2021-04-01 DIAGNOSIS — L821 Other seborrheic keratosis: Secondary | ICD-10-CM

## 2021-04-01 DIAGNOSIS — Z1283 Encounter for screening for malignant neoplasm of skin: Secondary | ICD-10-CM | POA: Diagnosis not present

## 2021-04-01 DIAGNOSIS — D229 Melanocytic nevi, unspecified: Secondary | ICD-10-CM

## 2021-04-01 DIAGNOSIS — B070 Plantar wart: Secondary | ICD-10-CM | POA: Diagnosis not present

## 2021-04-01 DIAGNOSIS — D239 Other benign neoplasm of skin, unspecified: Secondary | ICD-10-CM

## 2021-04-01 DIAGNOSIS — D2261 Melanocytic nevi of right upper limb, including shoulder: Secondary | ICD-10-CM

## 2021-04-01 DIAGNOSIS — D492 Neoplasm of unspecified behavior of bone, soft tissue, and skin: Secondary | ICD-10-CM

## 2021-04-01 DIAGNOSIS — D225 Melanocytic nevi of trunk: Secondary | ICD-10-CM

## 2021-04-01 DIAGNOSIS — D1800 Hemangioma unspecified site: Secondary | ICD-10-CM

## 2021-04-01 HISTORY — DX: Other benign neoplasm of skin, unspecified: D23.9

## 2021-04-01 NOTE — Patient Instructions (Addendum)

## 2021-04-01 NOTE — Progress Notes (Signed)
New Patient Visit  Subjective  Lisa Brady is a 35 y.o. female who presents for the following: Annual Exam (Mole check ). Fx hx of Melanoma.  The patient presents for Total-Body Skin Exam (TBSE) for skin cancer screening and mole check. Patient  [redacted] weeks pregnant   The following portions of the chart were reviewed this encounter and updated as appropriate:   Tobacco  Allergies  Meds  Problems  Med Hx  Surg Hx  Fam Hx     Review of Systems:  No other skin or systemic complaints except as noted in HPI or Assessment and Plan.  Objective  Well appearing patient in no apparent distress; mood and affect are within normal limits.  A full examination was performed including scalp, head, eyes, ears, nose, lips, neck, chest, axillae, abdomen, back, buttocks, bilateral upper extremities, bilateral lower extremities, hands, feet, fingers, toes, fingernails, and toenails. All findings within normal limits unless otherwise noted below.  Objective  plantar feet: Verrucous papules -- Discussed viral etiology and contagion.   Objective  Right medial forearm: 0.5 cm Irregular brown macule   Objective  Right inferior  axilla: 0.5 cm Irregular brown macule   Objective  Right sup medial  scapula: 0.5 cm Irregular brown macule   Objective  Right lateral deltoid: 0.4 cm irregular brown macule    Assessment & Plan  Plantar wart plantar feet Chronic and persistent.   Discussed treatment options with the patient. Pt decline treatment today -she is pregnant currently.  Neoplasm of skin (4) Right medial forearm Epidermal / dermal shaving  Lesion diameter (cm):  0.5 Informed consent: discussed and consent obtained   Timeout: patient name, date of birth, surgical site, and procedure verified   Procedure prep:  Patient was prepped and draped in usual sterile fashion Prep type:  Isopropyl alcohol Anesthesia: the lesion was anesthetized in a standard fashion   Anesthetic:  1%  lidocaine w/ epinephrine 1-100,000 buffered w/ 8.4% NaHCO3 Hemostasis achieved with: pressure, aluminum chloride and electrodesiccation   Outcome: patient tolerated procedure well   Post-procedure details: sterile dressing applied and wound care instructions given   Dressing type: bandage and petrolatum    Specimen 1 - Surgical pathology Differential Diagnosis: R/O Dysplastic nevus vs other   Check Margins: No 0.5 cm Irregular brown macule  Right inferior  axilla Epidermal / dermal shaving  Lesion diameter (cm):  0.5 Informed consent: discussed and consent obtained   Timeout: patient name, date of birth, surgical site, and procedure verified   Procedure prep:  Patient was prepped and draped in usual sterile fashion Prep type:  Isopropyl alcohol Anesthesia: the lesion was anesthetized in a standard fashion   Anesthetic:  1% lidocaine w/ epinephrine 1-100,000 buffered w/ 8.4% NaHCO3 Hemostasis achieved with: pressure, aluminum chloride and electrodesiccation   Outcome: patient tolerated procedure well   Post-procedure details: sterile dressing applied and wound care instructions given   Dressing type: bandage and petrolatum    Specimen 2 - Surgical pathology Differential Diagnosis: R/O Dysplastic nevus vs other  Check Margins: No 0.5 cm  Irregular brown macule  Right sup medial  scapula Epidermal / dermal shaving  Lesion diameter (cm):  0.5 Informed consent: discussed and consent obtained   Timeout: patient name, date of birth, surgical site, and procedure verified   Procedure prep:  Patient was prepped and draped in usual sterile fashion Prep type:  Isopropyl alcohol Anesthesia: the lesion was anesthetized in a standard fashion   Anesthetic:  1% lidocaine  w/ epinephrine 1-100,000 buffered w/ 8.4% NaHCO3 Hemostasis achieved with: pressure, aluminum chloride and electrodesiccation   Outcome: patient tolerated procedure well   Post-procedure details: sterile dressing applied  and wound care instructions given   Dressing type: bandage and petrolatum    Specimen 3 - Surgical pathology Differential Diagnosis: R/O Dysplastic nevus vs other   Check Margins: No 0.5 cm Irregular brown macule  Right lateral deltoid Epidermal / dermal shaving  Lesion diameter (cm):  0.4 Informed consent: discussed and consent obtained   Timeout: patient name, date of birth, surgical site, and procedure verified   Procedure prep:  Patient was prepped and draped in usual sterile fashion Prep type:  Isopropyl alcohol Anesthesia: the lesion was anesthetized in a standard fashion   Anesthetic:  1% lidocaine w/ epinephrine 1-100,000 buffered w/ 8.4% NaHCO3 Hemostasis achieved with: pressure, aluminum chloride and electrodesiccation   Outcome: patient tolerated procedure well   Post-procedure details: sterile dressing applied and wound care instructions given   Dressing type: bandage and petrolatum    Specimen 4 - Surgical pathology Differential Diagnosis: R/O Dysplastic nevus   Check Margins: No 0.4 cm irregular brown macule    Lentigines - Scattered tan macules - Due to sun exposure - Benign-appering, observe - Recommend daily broad spectrum sunscreen SPF 30+ to sun-exposed areas, reapply every 2 hours as needed. - Call for any changes  Seborrheic Keratoses - Stuck-on, waxy, tan-brown papules and/or plaques  - Benign-appearing - Discussed benign etiology and prognosis. - Observe - Call for any changes  Melanocytic Nevi - Tan-brown and/or pink-flesh-colored symmetric macules and papules - Benign appearing on exam today - Observation - Call clinic for new or changing moles - Recommend daily use of broad spectrum spf 30+ sunscreen to sun-exposed areas.   Hemangiomas - Red papules - Discussed benign nature - Observe - Call for any changes  Actinic Damage - Chronic condition, secondary to cumulative UV/sun exposure - diffuse scaly erythematous macules with  underlying dyspigmentation - Recommend daily broad spectrum sunscreen SPF 30+ to sun-exposed areas, reapply every 2 hours as needed.  - Staying in the shade or wearing long sleeves, sun glasses (UVA+UVB protection) and wide brim hats (4-inch brim around the entire circumference of the hat) are also recommended for sun protection.  - Call for new or changing lesions.  Skin cancer screening performed today.  Return in about 1 year (around 04/01/2022) for TBSE.  IMarye Round, CMA, am acting as scribe for Sarina Ser, MD .  Documentation: I have reviewed the above documentation for accuracy and completeness, and I agree with the above.  Sarina Ser, MD

## 2021-04-03 ENCOUNTER — Ambulatory Visit (INDEPENDENT_AMBULATORY_CARE_PROVIDER_SITE_OTHER): Payer: No Typology Code available for payment source | Admitting: Certified Nurse Midwife

## 2021-04-03 ENCOUNTER — Other Ambulatory Visit: Payer: Self-pay

## 2021-04-03 ENCOUNTER — Ambulatory Visit: Payer: No Typology Code available for payment source

## 2021-04-03 VITALS — BP 106/69 | HR 68 | Wt 121.3 lb

## 2021-04-03 DIAGNOSIS — O09521 Supervision of elderly multigravida, first trimester: Secondary | ICD-10-CM

## 2021-04-03 DIAGNOSIS — Z8759 Personal history of other complications of pregnancy, childbirth and the puerperium: Secondary | ICD-10-CM

## 2021-04-03 DIAGNOSIS — Z3480 Encounter for supervision of other normal pregnancy, unspecified trimester: Secondary | ICD-10-CM

## 2021-04-03 DIAGNOSIS — M533 Sacrococcygeal disorders, not elsewhere classified: Secondary | ICD-10-CM

## 2021-04-03 DIAGNOSIS — O09299 Supervision of pregnancy with other poor reproductive or obstetric history, unspecified trimester: Secondary | ICD-10-CM

## 2021-04-03 DIAGNOSIS — M62838 Other muscle spasm: Secondary | ICD-10-CM

## 2021-04-03 DIAGNOSIS — M21372 Foot drop, left foot: Secondary | ICD-10-CM

## 2021-04-03 DIAGNOSIS — Z1379 Encounter for other screening for genetic and chromosomal anomalies: Secondary | ICD-10-CM

## 2021-04-03 DIAGNOSIS — Z3A12 12 weeks gestation of pregnancy: Secondary | ICD-10-CM

## 2021-04-03 DIAGNOSIS — Z3689 Encounter for other specified antenatal screening: Secondary | ICD-10-CM

## 2021-04-03 HISTORY — DX: Personal history of other complications of pregnancy, childbirth and the puerperium: Z87.59

## 2021-04-03 LAB — POCT URINALYSIS DIPSTICK OB
Bilirubin, UA: NEGATIVE
Blood, UA: NEGATIVE
Glucose, UA: NEGATIVE
Ketones, UA: NEGATIVE
Leukocytes, UA: NEGATIVE
Nitrite, UA: NEGATIVE
POC,PROTEIN,UA: NEGATIVE
Spec Grav, UA: 1.01 (ref 1.010–1.025)
Urobilinogen, UA: 0.2 E.U./dL
pH, UA: 6.5 (ref 5.0–8.0)

## 2021-04-03 NOTE — Patient Instructions (Signed)
Self External Trigger Point Relief    1) Wash your hands and prop yourself up in a way where you can easily reach the vagina. You may wish to have a small hand-held mirror near by.  2) Use the 2 middle fingers to put gentle pressure on the three external pelvic floor muscles and hold pressure and take deep breaths as you allow the tension to release and discomfort to dissipate   3) Repeat the process for any trigger points you find spending between 3-10 minutes on this every 1-2 days until you do not find any more trigger points or you are told otherwise by your therapist.

## 2021-04-03 NOTE — Patient Instructions (Signed)
Healthy Weight Gain During Pregnancy A certain amount of weight gain during pregnancy is normal and healthy. The amount of weight you should gain during pregnancy depends on your overall health and your weight before you became pregnant. Talk with your health care provider to find out how much weight you should gain during your pregnancy. General guidelines for a healthy total weight gain during pregnancy are based on your body mass index (BMI) and are listed below. If your BMI at or before the start of your pregnancy is:  Less than 18.5 (underweight), you should gain 28-40 lb (13-18 kg).  18.5-24.9 (normal weight), you should gain 25-35 lb (11-16 kg).  25-29.9 (overweight), you should gain 15-25 lb (7-11 kg).  30 or higher (obese), you should gain 11-20 lb (5-9 kg). Your health care provider may recommend that you:  Gain more weight, if you were underweight before pregnancy or if you are pregnant with more than one baby.  Gain less weight, if you were overweight before pregnancy or if you are gaining too much weight during your pregnancy. How does unhealthy weight gain affect me? Gaining too much weight during pregnancy can lead to pregnancy complications, such as:  A temporary form of diabetes that develops during pregnancy (gestational diabetes).  Hypertensive disorders of pregnancy (preeclampsia or gestational hypertension).  Raising your risk of having a more difficult delivery or a surgical delivery (cesarean delivery, or C-section). How does unhealthy weight gain affect my baby? Not gaining enough weight can be life-threatening for your baby. It may also raise these risks for your baby:  Being born early (preterm).  Being smaller than normal during pregnancy or not growing normally (intrauterine growth restriction).  Having a low weight at birth. Gaining too much weight may raise these risks for your baby:  Growing larger than normal during pregnancy (large for gestational  age or macrosomia).  Increased risk of obesity. What actions can I take to gain a healthy amount of weight during pregnancy? Nutrition  Eat healthy foods. Every day, try to eat: ? Fruits and vegetables. Include a variety of colors and types, like sweet potatoes, oranges, apples, bell peppers, beets, berries, squash, and broccoli. ? Whole grains, such as millet, barley, whole-wheat breads and cereals, and oatmeal. ? Low-fat dairy products, like yogurt, milk, and cheese. You can also include non-dairy choices, like almond milk or rice milk. ? Protein-rich foods, like lean meat, chicken, eggs, peas, and beans.  Avoid foods that are fried or have a lot of fat, salt (sodium), or sugar.  Drink enough fluid to keep your urine pale yellow.  Choose healthy snack and drink options when you are away from home: ? Drink water. Avoid soda, sports drinks, and juices that have added sugar. ? Avoid drinks with caffeine, such as coffee and energy drinks. ? Eat snacks that are high in protein, such as nuts, protein bars, and low-fat yogurt. ? Carry convenient snacks with you that do not need refrigeration, such as a pack of trail mix, an apple, or a granola bar.  If you need help improving your diet, work with a health care provider or a dietitian.   Activity  Exercise regularly, as told by your health care provider. ? If you were active before becoming pregnant, you may be able to continue your regular fitness activities. ? If you were not active before pregnancy, you may gradually build up to exercising for 30 or more minutes on most days of the week. This may include walking, swimming,   or yoga.  Ask your health care provider what activities are safe for you. Talk with your health care provider about whether you may need to be excused from certain school or work activities.   Follow these instructions at home:  Take over-the-counter and prescription medicines only as told by your health care  provider.  Take all prenatal supplements as told by your health care provider.  Keep track of your weight gain during pregnancy.  Keep all health care visits during pregnancy (prenatal visits). These visits are a good time to discuss your weight gain. Your health care provider will weigh you at each visit to make sure you are gaining a healthy amount of weight. Where to find support Some pregnant women and teens face unique challenges and need extra support. If you have questions or need help gaining a healthy amount of weight during pregnancy, these people may help:  Your health care provider.  A dietitian.  Your school nurse.  Your family and friends.  Local counseling centers, church groups, or clinics that have services for teens. Where to find more information Learn more about managing your weight gain during pregnancy from:  American Pregnancy Association: americanpregnancy.org  [place] Department of Agriculture pregnancy weight gain calculator: myplate.gov Contact a health care provider if:  You are unable to eat or drink for longer than 24 hours.  You cannot afford food or have trouble accessing regular meals. Get help right away if you: Summary  Talk with your health care provider to find out how much weight you should gain during your pregnancy.  Too much or too little weight gain during pregnancy can lead to complications for you and your baby.  Eat healthy foods like fruits and vegetables, whole grains, low-fat dairy products, and protein-rich foods.  Ask your health care provider what activities are safe for you.  Keep all of your prenatal visits. This information is not intended to replace advice given to you by your health care provider. Make sure you discuss any questions you have with your health care provider. Document Revised: 06/19/2020 Document Reviewed: 06/19/2020 Elsevier Patient Education  2021 [employer].    Common Medications Safe in  Pregnancy  Acne:      Constipation:  Benzoyl Peroxide     Colace  Clindamycin      Dulcolax Suppository  Topica Erythromycin     Fibercon  Salicylic Acid      Metamucil         Miralax AVOID:        Senakot   Accutane    Cough:  Retin-A       Cough Drops  Tetracycline      Phenergan w/ Codeine if Rx  Minocycline      Robitussin (Plain & DM)  Antibiotics:     Crabs/Lice:  Ceclor       RID  Cephalosporins    AVOID:  E-Mycins      Kwell  Keflex  Macrobid/Macrodantin   Diarrhea:  Penicillin      Kao-Pectate  Zithromax      Imodium AD         PUSH FLUIDS AVOID:       Cipro     Fever:  Tetracycline      Tylenol (Regular or Extra  Minocycline       Strength)  Levaquin      Extra Strength-Do not          Exceed 8 tabs/24 hrs Caffeine:        <  200mg/day (equiv. To 1 cup of coffee or  approx. 3 12 oz sodas)         Gas: Cold/Hayfever:       Gas-X  Benadryl      Mylicon  Claritin       Phazyme  **Claritin-D        Chlor-Trimeton    Headaches:  Dimetapp      ASA-Free Excedrin  Drixoral-Non-Drowsy     Cold Compress  Mucinex (Guaifenasin)     Tylenol (Regular or Extra  Sudafed/Sudafed-12 Hour     Strength)  **Sudafed PE Pseudoephedrine   Tylenol Cold & Sinus     Vicks Vapor Rub  Zyrtec  **AVOID if Problems With Blood Pressure         Heartburn: Avoid lying down for at least 1 hour after meals  Aciphex      Maalox     Rash:  Milk of Magnesia     Benadryl    Mylanta       1% Hydrocortisone Cream  Pepcid  Pepcid Complete   Sleep Aids:  Prevacid      Ambien   Prilosec       Benadryl  Rolaids       Chamomile Tea  Tums (Limit 4/day)     Unisom         Tylenol PM         Warm milk-add vanilla or  Hemorrhoids:       Sugar for taste  Anusol/Anusol H.C.  (RX: Analapram 2.5%)  Sugar Substitutes:  Hydrocortisone OTC     Ok in moderation  Preparation H      Tucks        Vaseline lotion applied to tissue with  wiping    Herpes:     Throat:  Acyclovir      Oragel  Famvir  Valtrex     Vaccines:         Flu Shot Leg Cramps:       *Gardasil  Benadryl      Hepatitis A         Hepatitis B Nasal Spray:       Pneumovax  Saline Nasal Spray     Polio Booster         Tetanus Nausea:       Tuberculosis test or PPD  Vitamin B6 25 mg TID   AVOID:    Dramamine      *Gardasil  Emetrol       Live Poliovirus  Ginger Root 250 mg QID    MMR (measles, mumps &  High Complex Carbs @ Bedtime    rebella)  Sea Bands-Accupressure    Varicella (Chickenpox)  Unisom 1/2 tab TID     *No known complications           If received before Pain:         Known pregnancy;   Darvocet       Resume series after  Lortab        Delivery  Percocet    Yeast:   Tramadol      Femstat  Tylenol 3      Gyne-lotrimin  Ultram       Monistat  Vicodin           MISC:         All Sunscreens           Hair Coloring/highlights          Insect   Repellant's          (Including DEET)         Mystic Tans   Second Trimester of Pregnancy  The second trimester of pregnancy is from week 13 through week 27. This is also called months 4 through 6 of pregnancy. This is often the time when you feel your best. During the second trimester:  Morning sickness is less or has stopped.  You may have more energy.  You may feel hungry more often. At this time, your unborn baby (fetus) is growing very fast. At the end of the sixth month, the unborn baby may be up to 12 inches long and weigh about 1 pounds. You will likely start to feel the baby move between 16 and 20 weeks of pregnancy. Body changes during your second trimester Your body continues to go through many changes during this time. The changes vary and generally return to normal after the baby is born. Physical changes  You will gain more weight.  You may start to get stretch marks on your hips, belly (abdomen), and breasts.  Your breasts will grow and may hurt.  Dark spots or  blotches may develop on your face.  A dark line from your belly button to the pubic area (linea nigra) may appear.  You may have changes in your hair. Health changes  You may have headaches.  You may have heartburn.  You may have trouble pooping (constipation).  You may have hemorrhoids or swollen, bulging veins (varicose veins).  Your gums may bleed.  You may pee (urinate) more often.  You may have back pain. Follow these instructions at home: Medicines  Take over-the-counter and prescription medicines only as told by your doctor. Some medicines are not safe during pregnancy.  Take a prenatal vitamin that contains at least 600 micrograms (mcg) of folic acid. Eating and drinking  Eat healthy meals that include: ? Fresh fruits and vegetables. ? Whole grains. ? Good sources of protein, such as meat, eggs, or tofu. ? Low-fat dairy products.  Avoid raw meat and unpasteurized juice, milk, and cheese.  You may need to take these actions to prevent or treat trouble pooping: ? Drink enough fluids to keep your pee (urine) pale yellow. ? Eat foods that are high in fiber. These include beans, whole grains, and fresh fruits and vegetables. ? Limit foods that are high in fat and sugar. These include fried or sweet foods. Activity  Exercise only as told by your doctor. Most people can do their usual exercise during pregnancy. Try to exercise for 30 minutes at least 5 days a week.  Stop exercising if you have pain or cramps in your belly or lower back.  Do not exercise if it is too hot or too humid, or if you are in a place of great height (high altitude).  Avoid heavy lifting.  If you choose to, you may have sex unless your doctor tells you not to. Relieving pain and discomfort  Wear a good support bra if your breasts are sore.  Take warm water baths (sitz baths) to soothe pain or discomfort caused by hemorrhoids. Use hemorrhoid cream if your doctor approves.  Rest with  your legs raised (elevated) if you have leg cramps or low back pain.  If you develop bulging veins in your legs: ? Wear support hose as told by your doctor. ? Raise your feet for 15 minutes, 3-4 times a day. ? Limit salt in your food. Safety    Wear your seat belt at all times when you are in a car.  Talk with your doctor if someone is hurting you or yelling at you a lot. Lifestyle  Do not use hot tubs, steam rooms, or saunas.  Do not douche. Do not use tampons or scented sanitary pads.  Avoid cat litter boxes and soil used by cats. These carry germs that can harm your baby and can cause a loss of your baby by miscarriage or stillbirth.  Do not use herbal medicines, illegal drugs, or medicines that are not approved by your doctor. Do not drink alcohol.  Do not smoke or use any products that contain nicotine or tobacco. If you need help quitting, ask your doctor. General instructions  Keep all follow-up visits. This is important.  Ask your doctor about local prenatal classes.  Ask your doctor about the right foods to eat or for help finding a counselor. Where to find more information  American Pregnancy Association: americanpregnancy.org  American College of Obstetricians and Gynecologists: www.acog.org  Office on Women's Health: womenshealth.gov/pregnancy Contact a doctor if:  You have a headache that does not go away when you take medicine.  You have changes in how you see, or you see spots in front of your eyes.  You have mild cramps, pressure, or pain in your lower belly.  You continue to feel like you may vomit (nauseous), you vomit, or you have watery poop (diarrhea).  You have bad-smelling fluid coming from your vagina.  You have pain when you pee or your pee smells bad.  You have very bad swelling of your face, hands, ankles, feet, or legs.  You have a fever. Get help right away if:  You are leaking fluid from your vagina.  You have spotting or bleeding  from your vagina.  You have very bad belly cramping or pain.  You have trouble breathing.  You have chest pain.  You faint.  You have not felt your baby move for the time period told by your doctor.  You have new or increased pain, swelling, or redness in an arm or leg. Summary  The second trimester of pregnancy is from week 13 through week 27 (months 4 through 6).  Eat healthy meals.  Exercise as told by your doctor. Most people can do their usual exercise during pregnancy.  Do not use herbal medicines, illegal drugs, or medicines that are not approved by your doctor. Do not drink alcohol.  Call your doctor if you get sick or if you notice anything unusual about your pregnancy. This information is not intended to replace advice given to you by your health care provider. Make sure you discuss any questions you have with your health care provider. Document Revised: 04/30/2020 Document Reviewed: 03/06/2020 Elsevier Patient Education  2021 [employer]  

## 2021-04-03 NOTE — Therapy (Signed)
[place] MAIN [facility] SERVICES [address] [place], [place], [address] Phone: [phone]   Fax:  [phone]  Physical Therapy Treatment  The patient has been informed of current processes in place at Outpatient Rehab to protect patients from Covid-19 exposure including social distancing, schedule modifications, and new cleaning procedures. After discussing their particular risk with a therapist based on the patient's personal risk factors, the patient has decided to proceed with in-person therapy.   Patient Details  Name: Lisa Brady MRN: [id number] Date of Birth: [birth year] No data recorded  Encounter Date: 04/03/2021   PT End of Session - 04/03/21 0854    Visit Number 21    Number of Visits 34    Date for PT Re-Evaluation 12/26/20    Authorization Type [place]    Authorization - Visit Number 8    Authorization - Number of Visits 10    Progress Note Due on Visit 10    PT Start Time 0730    PT Stop Time 0830    PT Time Calculation (min) 60 min    Activity Tolerance Patient tolerated treatment well;No increased pain    Behavior During Therapy WFL for tasks assessed/performed           Past Medical History:  Diagnosis Date  . Foot drop   . Hypermobility arthralgia   . Maternal varicella, non-immune 11/20/2018  . Scoliosis   . Urinary incontinence     Past Surgical History:  Procedure Laterality Date  . WISDOM TOOTH EXTRACTION  2006    There were no vitals filed for this visit.   Pelvic Floor Physical Therapy Treatment Note  SCREENING  Changes in medications, allergies, or medical history?: pregnant    SUBJECTIVE  Patient reports: She coughed for an entire week after last visit which just finished on Sunday. She had to have to wear depends to bed until Sunday. She feels like her coordination from kegels is not good. This week things have gotten a little better but she has had leakage 2-3 times and more  urgency. Her neck flared up a lot over the weekend. She has been stretching etc. Hip is tight but not painful.   Precautions:  She is [redacted] weeks pregnant,  Hypermobility and Scoliosis  Pain update:  Location of pain:  Low back hip/side (neck) Current pain:  1-2/10 (0/10 with turning) Max pain:  2/10  (3/10) Least pain:  0/10 (0/10) Nature of pain: tightness/achy  **no increased pain following treatment   Patient Goals: Control her bladder/not need depends.   OBJECTIVE  Changes in:   Observation:  Slight R anterior innominate rotation (from previous session)  R scapula protracted and winging>L (from prior session)  12/26/20: alignment not assessed before MMT performed, audble "pop" when performing MMT for L adduction and then pelvis assessed and appropriately aligned in supine.  01/09/21: Pt. Demonstrates R anterior rotation, L up-slip and LLD with LLE long was finally detected. **PSIS appear level in standing with 2/3 heel-lift in R shoe.   03/06/21: mild R anterior rotation, resolved following treatment.  03/20/21: L up-slip and R anterior/L posterior rotation, possible R out-flare.  **following treatment pubic symphysis level, up-slip corrected but R anterion rotation remaining.   TODAY: R out-flare.  ROM/Mobility:  Decreased scapular depression and downward rotation ROM in R>L scapula. Decreased L cervical rotation with painful end-feel. Decreased mobility at pubic symphysis and pain with pressure at L Pubic ramus  (from prior session)  Pt demonstrates  decreased upward rotation ROM for R scapula due to spasm/ fascial tightness that is limiting proprioception and recruitment for effective downward scapular rotation strengthening. (from previous session)   Strength:  Muscle fatigue reached earlier on R>L with tall-kneeling/balance exercises. Imbalance surrounding R scapula not allowing Pt. To perform I's, Y's, T's W's well. (from prior session)  MMT LE  - L DF 4+/5  (different rater may account for difference but could be due to spasms in back since test performed prior to DN/manual) - R DF 5/5 (from prior session)  Strength/MMT:  LE MMT (12/26/20) LE MMT Left Right  Hip flex:  (L2) /5 /5  Hip ext: 5/5 5/5  Hip abd: 4+/5 5/5  Hip add: 4+/5 5/5  Hip IR 4/5 4+/5  Hip ER 5/5 5/5  *audible "pop" with mild discomfort noted with resisted L adduction.  (02-06-21) LE MMT Left Right  Hip flex:  (L2) /5 /5  Hip ext: -5/5 5/5  Hip abd: 5/5 5/5  Hip add: 4+/5 4/5!  Hip IR 4+/5 4+/5  Hip ER 4++/5 4++/5    Deep-core strength/endurance sufficient to allow Pt. To perform toe-taps x5 in a row before rest needed.  Pelvic floor: TTP through R posterior PR/PC and OI as well as 5 o'clock region of posterior fourchette. TTP to to L posterior fourchette at ~ 5 o'clock. Coordinated and able to keep probe in even at ~ 45 degrees. Increased force production when TA and obliques recruited. ~ 3+/5 strength and maximal endurance at ~ 8 seconds.  (from prior session)  1/7:  Strength ~ 2+/5 initially with TTP to anterior and posterior PR/PC as well as decreased fascial mobility along anterior/inferior aspect of bladder.  **following treatment Pt. Able to achieve 3++/5 with decreased recruitment of the posterior PR/PC and IC near coccyx remaining. (from previous session)  01/09/21: TTP to R>L coccygeus, OI, and IC near coccyx. Strength ~ 3/5 and 3 sec. endurance with poor recruitment of posterior PFM.  **following treatment, strength ~ 3++/5 with much more squeeze from posterior PFM and ~ 8 sec. Hold.  TODAY: TTP to B STP and bulbo as well as EUS and decreased mobility through 2nd layer fascia on R>L.  Palpation:  Greatest tenderness with pressure on spinous processes T5 and L2. (from previous session)  02/06/21: TTP to R TFL, iliopsoas, vastus lateralis and rectus femoris  03/06/21: TTP to R Iliacus, Psoas, TFL, and vastus lateralis.  03/20/21:  TTP to R iliacus L QL,  adductor brevis and pectineus  Gait Analysis: (from prior session) Gait speed: 1.23ms ; anterior pelvic tilt, knee hyperextension, heel strike - with cues pelvic neutral achieved, with appropriate arm swing and decreased heel strike.     INTERVENTIONS THIS SESSION: Manual: Performed TP release to  B STP and bulbo as well as EUS externally followed by fascial release to 2nd layer fascia on R then educated Pt. On how to perform self external TP release to improve mobility and recruitment of the PFM to decrease leakage and take pressure off of the nerves.  Total time: 60 min.                             PT Short Term Goals - 06/13/20 0904      PT SHORT TERM GOAL #1   Title Patient will demonstrate improved pelvic alignment and balance of musculature surrounding the pelvis to facilitate decreased PFM spasms and decrease pressure on nerve roots to  allow for optimal PFM function    Baseline Has L foot-drop and pain in L glutes/knee as well as inability to sense PFM and L posterior innominate rotation/spasms surrounding pelvis.    Time 5    Period Weeks    Status Achieved    Target Date 11/26/19      PT SHORT TERM GOAL #2   Title Patient will demonstrate HEP x1 in the clinic to demonstrate understanding and proper form to allow for further improvement.    Baseline Pt. lacks knowledge of therapeutic ecersise that will decrease Sx.    Time 5    Period Weeks    Status Achieved    Target Date 11/26/19      PT SHORT TERM GOAL #3   Title Pt. will be able to walk 250 Ft.with CGA and w/o assistive device to demonstrate improved balance/decreased instability and foot drop.    Baseline Pt. using RW to AMB due to L foot-drop and increased instability.    Time 5    Period Weeks    Status Achieved    Target Date 08/20/19      PT SHORT TERM GOAL #4   Title Patient will demonstrate a coordinated contraction, relaxation, and bulge of the pelvic floor muscles to demonstrate  functional recruitment and motion and allow for further strengthening.    Baseline Pt. reports inability to sense the PFM for kegel or relaxation, having mixed UI    Time 5    Period Weeks    Status Achieved    Target Date 08/20/19             PT Long Term Goals - 10/17/20 0740      PT LONG TERM GOAL #1   Title Patient will report no episodes of UUI/SUI over the course of the prior two weeks to demonstrate improved functional ability.    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 As of 4/30: She had been doing better but then took a step backward when she was sick and vomiting a lot. As of 7/9: Pt. continues to have decreased incontinence but had an episode of total bladder loss and UUI ~ 1-2 times/day still (worsened due to overuse/spasm of LB musculature. As of 11/12: She had one "big accident" and one "snexe-pee" accident over the week.    Time 10    Period Weeks    Status On-going    Target Date 12/26/20      PT LONG TERM GOAL #2   Title Patient will report no pain with intercourse to demonstrate improved functional ability.    Baseline having mild pain with initial penetration As of 1:29: had some pain at initial penetration and with deeper thrusting at ~ 3-4/10 but "not terrible" 10/17/20: has not attempted to know    Time 10    Period Weeks    Status On-going    Target Date 12/26/20      PT LONG TERM GOAL #3   Title Patient will score at or below 22/100 on the UDI and 27/100 on the UIQ to demonstrate a clinically meaningful decrease in disability and distress due to pelvic floor dysfunction.    Baseline UDI: 67, UIQ: 72/100 10-19-19: UDI 55.5, UIQ 76/100; POPDI 11/100 As of 1/29: UDI:33/100, UIQ: 6/100, POPDI:6/100. As of 4/30: Due to set-back from vomiting. As of 11/12: UDI: 17/100, UIQ: POPDI: 11/100  Time 10    Period Weeks    Status Achieved    Target Date 08/22/20      PT  LONG TERM GOAL #4   Title Pt. 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    Baseline Pt. requiring RW for AMB due to L foot-drop and imbalance. As of 10/12: Pt. requiring some support (e.g. grocery cart) for > 20 min. of AMB or stairs.    Time 10    Period Weeks    Status Achieved    Target Date 11/25/20      PT LONG TERM GOAL #5   Title Pt. will improve in FOTO score by 10 points to demonstrate improved function.    Baseline FOTO PFDI Urinary: 29, Urinary Problem:54    Time 10    Period Weeks    Status New    Target Date 12/26/20      Additional Long Term Goals   Additional Long Term Goals Yes      PT LONG TERM GOAL #6   Title Patient will demonstrate 4+/5 strength or better in the PF and surrounding musculature to show improved functional strength for childcare and other vocational and recreational activities.    Baseline PFM 3/5    Time 10    Period Weeks    Status New    Target Date 12/26/20                 Plan - 04/03/21 0854    Clinical Impression Statement Pt. Responded well to all interventions today, demonstrating decreased spasm and increased fascial mobility with less TTP, as well as understanding and correct performance of all education and exercises provided today. They will continue to benefit from skilled physical therapy to work toward remaining goals and maximize function as well as decrease likelihood of symptom increase or recurrence.    PT Next Visit Plan treat R out-flare. re-assess pelvic alignment with heel-lift in place. discuss plan for strengthening and walking program, stabilize gait pattern, discuss benefits of aquatic exercise. Review toe-taps, re-assess PFM, TPDN to R posterior scapular stabilizers and spinal mobilization/ rotational mobs into R rotation and perform PNF for the scapula and rainbows on the Re-assess rib mobility and  obliques/diaphragm, review deep-core coordination (tall kneel, knee step through, stepping, lean-backs); re-assess for Piriformis spasm (have performed DN and TP release now); check in with long term HEP performance    PT Home Exercise Plan A day: Te[place]ot, Side-planks, Planks, Dorothy's, scapular rows and I's, Y's, T's, and W's (temp. rows,sacp retractions, and stretch levator scap on R. Door stretch,B H[facility]:[id number] with leg lift Child's pose V-sit/ single leg hamstring stretch, side-lying hip ABD and prone hip EXT. Side stretch, modified bridge, Piriformis stretch, frog stretch Every day: Kegels 3-5x/day with long-hold and pelvic tilt with sneeze, laugh/cough kegel, vaginal weights, tall kneeling, tall-kneeling step-through, lean-backs, Corrective: Upper cervical rotationTennis ball releaseadded MET for R anterior for 1 week. Heel-lift for RLE. Pilates videos, self external PFM release.    Consulted and Agree with Plan of Care Patient           Patient will benefit from skilled therapeutic intervention in order to improve the following deficits and impairments:     Visit Diagnosis: Other muscle spasm  Foot drop, left  Sacrococcygeal disorders, not elsewhere classified     Problem List Patient Active Problem List   Diagnosis Date  Noted  . Hypermobility arthralgia 02/25/2021  . Preventative health care 02/25/2021  . Positive urine pregnancy test 02/25/2021  . Chronic pain of right knee 09/17/2020  . Urinary incontinence 08/17/2019  . History of left foot drop 08/17/2019   Willa Rough DPT, ATC Willa Rough 04/03/2021, 9:00 AM  [place] MAIN [facility] SERVICES [address] [place], [place], [address] Phone: [phone]   Fax:  [phone]  Name: Lisa Brady MRN: [id number] Date of Birth: [birth year]

## 2021-04-03 NOTE — Progress Notes (Signed)
NEW OB HISTORY AND PHYSICAL  SUBJECTIVE:       Lisa Brady is a 35 y.o. G31P1001 female, Patient's last menstrual period was 01/06/2021., Estimated Date of Delivery: 10/13/2021, presents today for establishment of Prenatal Care.  Reports urinary frequency.   Desires genetic screening. Prefers midwifery care.    Gynecologic History  Patient's last menstrual period was 01/06/2021.   Contraception: none  Last Pap: 2019. Results were: Neg/Neg  Obstetric History  OB History  Gravida Para Term Preterm AB Living  2 1 1     1   SAB IAB Ectopic Multiple Live Births        0 1    # Outcome Date GA Lbr Len/2nd Weight Sex Delivery Anes PTL Lv  2 Current           1 Term 07/05/19 [redacted]w[redacted]d / 03:37 9 lb 10.2 oz (4.37 kg) M Vag-Spont EPI  LIV    Past Medical History:  Diagnosis Date  . Foot drop   . Hypermobility arthralgia   . Maternal varicella, non-immune 11/20/2018  . Scoliosis   . Urinary incontinence     Past Surgical History:  Procedure Laterality Date  . WISDOM TOOTH EXTRACTION  2006    Current Outpatient Medications on File Prior to Visit  Medication Sig Dispense Refill  . Prenatal Vit-Fe Fumarate-FA (PRENATAL MULTIVITAMIN) TABS tablet Take 1 tablet by mouth daily at 12 noon. 90 tablet 3   No current facility-administered medications on file prior to visit.    No Known Allergies  Social History   Socioeconomic History  . Marital status: Married    Spouse name: Lisa Brady  . Number of children: 1  . Years of education: Not on file  . Highest education level: Not on file  Occupational History  . Not on file  Tobacco Use  . Smoking status: Never Smoker  . Smokeless tobacco: Never Used  Vaping Use  . Vaping Use: Never used  Substance and Sexual Activity  . Alcohol use: Not Currently  . Drug use: Never  . Sexual activity: Not Currently  Other Topics Concern  . Not on file  Social History Narrative   Lisa Brady born [birth year]   Social Determinants of Health    Financial Resource Strain: Not on file  Food Insecurity: Not on file  Transportation Needs: Not on file  Physical Activity: Not on file  Stress: Not on file  Social Connections: Not on file  Intimate Partner Violence: Not on file    Family History  Problem Relation Age of Onset  . Breast cancer Mother 20  . Melanoma Mother   . Hyperlipidemia Father   . Hypertension Father   . Hyperlipidemia Brother   . Brain cancer Maternal Grandmother   . Heart disease Maternal Grandfather   . Lung cancer Paternal Grandmother   . Lung cancer Paternal Grandfather   . Hyperlipidemia Brother   . Hyperlipidemia Brother   . Hyperlipidemia Brother     The following portions of the patient's history were reviewed and updated as appropriate: allergies, current medications, past OB history, past medical history, past surgical history, past family history, past social history, and problem list.  Review of Systems:  ROS negative except as noted above. Information obtained from patient.   OBJECTIVE:  BP 106/69   Pulse 68   Wt 121 lb 4.8 oz (55 kg)   LMP 01/06/2021   BMI 21.49 kg/m   Initial Physical Exam (New OB)  GENERAL APPEARANCE: alert, well appearing, in  no apparent distress  HEAD: normocephalic, atraumatic  MOUTH: deferred, due to COVID-19 pandemic  ABDOMEN: soft, nontender, nondistended, no abnormal masses, no epigastric pain and FHT present on bedside ultrasound  PELVIC EXAM: not indicated  ASSESSMENT:  Supervision of other normal pregnancy, antepartum - Plan: POC Urinalysis Dipstick OB, US OB Comp + 14 Wk, MaterniT21 PLUS Core NO Gender, CANCELED: MaterniT 21 plus Core, Blood  [redacted] weeks gestation of pregnancy - Plan: POC Urinalysis Dipstick OB, MaterniT21 PLUS Core NO Gender, CANCELED: MaterniT 21 plus Core, Blood  Genetic screening - Plan: MaterniT21 PLUS Core NO Gender, CANCELED: MaterniT 21 plus Core, Blood  AMA (advanced maternal age) multigravida 65+, first trimester  - Plan: POC Urinalysis Dipstick OB, US OB Comp + 14 Wk, MaterniT21 PLUS Core NO Gender, CANCELED: MaterniT 21 plus Core, Blood  Encounter for fetal anatomic survey - Plan: US OB Comp + 14 Wk  History of vacuum extraction assisted delivery  H/O macrosomia in infant in prior pregnancy, currently pregnant   PLAN: Prenatal care New OB counseling: The patient has been given an overview regarding routine prenatal care. Recommendations regarding diet, weight gain, and exercise in pregnancy were given. Prenatal testing, optional genetic testing, and ultrasound use in pregnancy were reviewed.  Benefits of Breast Feeding were discussed. The patient is encouraged to consider nursing her baby post partum. See orders

## 2021-04-05 ENCOUNTER — Encounter: Payer: Self-pay | Admitting: Dermatology

## 2021-04-06 ENCOUNTER — Encounter: Payer: Self-pay | Admitting: Dermatology

## 2021-04-06 ENCOUNTER — Telehealth: Payer: Self-pay

## 2021-04-06 NOTE — Telephone Encounter (Signed)
LM on VM pt with hx of Dysplastic nevus should be seen once or year or If any mole removed starts to come back return to the office

## 2021-04-08 LAB — MATERNIT21 PLUS CORE NO GENDER
Fetal Fraction: 9
Result (T21): NEGATIVE
Trisomy 13 (Patau syndrome): NEGATIVE
Trisomy 18 (Edwards syndrome): NEGATIVE
Trisomy 21 (Down syndrome): NEGATIVE

## 2021-04-17 ENCOUNTER — Other Ambulatory Visit: Payer: Self-pay

## 2021-04-17 ENCOUNTER — Ambulatory Visit: Payer: No Typology Code available for payment source | Attending: Obstetrics and Gynecology

## 2021-04-17 DIAGNOSIS — M62838 Other muscle spasm: Secondary | ICD-10-CM | POA: Insufficient documentation

## 2021-04-17 DIAGNOSIS — M533 Sacrococcygeal disorders, not elsewhere classified: Secondary | ICD-10-CM | POA: Diagnosis present

## 2021-04-17 DIAGNOSIS — M21372 Foot drop, left foot: Secondary | ICD-10-CM | POA: Insufficient documentation

## 2021-04-17 NOTE — Therapy (Signed)
[place] MAIN [facility] SERVICES [address] [place], [place], [address] Phone: [phone]   Fax:  [phone]  Physical Therapy Treatment  The patient has been informed of current processes in place at Outpatient Rehab to protect patients from Covid-19 exposure including social distancing, schedule modifications, and new cleaning procedures. After discussing their particular risk with a therapist based on the patient's personal risk factors, the patient has decided to proceed with in-person therapy.  Patient Details  Name: Lisa Brady MRN: [id number] Date of Birth: 1985-12-21 No data recorded  Encounter Date: 04/17/2021   PT End of Session - 04/24/21 0952    Visit Number 45    Number of Visits 90    Date for PT Re-Evaluation 12/26/20    Authorization Type [place]    Authorization - Visit Number 9    Authorization - Number of Visits 10    Progress Note Due on Visit 10    PT Start Time 0730    PT Stop Time 0830    PT Time Calculation (min) 60 min    Activity Tolerance Patient tolerated treatment well;No increased pain    Behavior During Therapy WFL for tasks assessed/performed           Past Medical History:  Diagnosis Date  . Dysplastic nevus 04/01/2021   right medial forearm, mod Atypia   . Dysplastic nevus 04/01/2021   right inferior axilla, mild atypia   . Dysplastic nevus 04/01/2021   right superior medial scapula, mild atypia   . Foot drop   . Hypermobility arthralgia   . Maternal varicella, non-immune 11/20/2018  . Scoliosis   . Urinary incontinence     Past Surgical History:  Procedure Laterality Date  . WISDOM TOOTH EXTRACTION  2006    There were no vitals filed for this visit.  Pelvic Floor Physical Therapy Treatment Note  SCREENING  Changes in medications, allergies, or medical history?: pregnant    SUBJECTIVE  Patient reports: She has had leakage with direction changes sometimes, most often a  small volume. She also had leakage with attempt during intercourse. Her back seems to be ok, nothing else seems to be an issue but the leakage but it seems to have regressed to ~ 6 months PP state.  Precautions:  She is [redacted] weeks pregnant,  Hypermobility and Scoliosis  Pain update:  Location of pain:  Low back hip/side (neck) Current pain:  1-2/10 (0/10 with turning) Max pain:  2/10  (3/10) Least pain:  0/10 (0/10) Nature of pain: tightness/achy  **no increased pain following treatment   Patient Goals: Control her bladder/not need depends.   OBJECTIVE  Changes in:   Observation:  Slight R anterior innominate rotation (from previous session)  R scapula protracted and winging>L (from prior session)  12/26/20: alignment not assessed before MMT performed, audble "pop" when performing MMT for L adduction and then pelvis assessed and appropriately aligned in supine.  01/09/21: Pt. Demonstrates R anterior rotation, L up-slip and LLD with LLE long was finally detected. **PSIS appear level in standing with 2/3 heel-lift in R shoe.   03/06/21: mild R anterior rotation, resolved following treatment.  03/20/21: L up-slip and R anterior/L posterior rotation, possible R out-flare.  **following treatment pubic symphysis level, up-slip corrected but R anterion rotation remaining.   TODAY: R out-flare.  ROM/Mobility:  Decreased scapular depression and downward rotation ROM in R>L scapula. Decreased L cervical rotation with painful end-feel. Decreased mobility at pubic symphysis and pain  with pressure at L Pubic ramus  (from prior session)  Pt demonstrates decreased upward rotation ROM for R scapula due to spasm/ fascial tightness that is limiting proprioception and recruitment for effective downward scapular rotation strengthening. (from previous session)   Strength:  Muscle fatigue reached earlier on R>L with tall-kneeling/balance exercises. Imbalance surrounding R scapula not allowing Pt.  To perform I's, Y's, T's W's well. (from prior session)  MMT LE  - L DF 4+/5 (different rater may account for difference but could be due to spasms in back since test performed prior to DN/manual) - R DF 5/5 (from prior session)  Strength/MMT:  LE MMT (12/26/20) LE MMT Left Right  Hip flex:  (L2) /5 /5  Hip ext: 5/5 5/5  Hip abd: 4+/5 5/5  Hip add: 4+/5 5/5  Hip IR 4/5 4+/5  Hip ER 5/5 5/5  *audible "pop" with mild discomfort noted with resisted L adduction.  (02-06-21) LE MMT Left Right  Hip flex:  (L2) /5 /5  Hip ext: -5/5 5/5  Hip abd: 5/5 5/5  Hip add: 4+/5 4/5!  Hip IR 4+/5 4+/5  Hip ER 4++/5 4++/5    Deep-core strength/endurance sufficient to allow Pt. To perform toe-taps x5 in a row before rest needed.  Pelvic floor: TTP through R posterior PR/PC and OI as well as 5 o'clock region of posterior fourchette. TTP to to L posterior fourchette at ~ 5 o'clock. Coordinated and able to keep probe in even at ~ 45 degrees. Increased force production when TA and obliques recruited. ~ 3+/5 strength and maximal endurance at ~ 8 seconds.  (from prior session)  1/7:  Strength ~ 2+/5 initially with TTP to anterior and posterior PR/PC as well as decreased fascial mobility along anterior/inferior aspect of bladder.  **following treatment Pt. Able to achieve 3++/5 with decreased recruitment of the posterior PR/PC and IC near coccyx remaining. (from previous session)  01/09/21: TTP to R>L coccygeus, OI, and IC near coccyx. Strength ~ 3/5 and 3 sec. endurance with poor recruitment of posterior PFM.  **following treatment, strength ~ 3++/5 with much more squeeze from posterior PFM and ~ 8 sec. Hold.  04/03/21: TTP to B STP and bulbo as well as EUS and decreased mobility through 2nd layer fascia on R>L.  04/17/20: TTP to all except coccygeus on L and to R anterior and posterior PR/PC. 2/5 at BOS, Pt. demonstrated 3+/ 5 strength at EOS.  Palpation:  Greatest tenderness with pressure on spinous  processes T5 and L2. (from previous session)  02/06/21: TTP to R TFL, iliopsoas, vastus lateralis and rectus femoris  03/06/21: TTP to R Iliacus, Psoas, TFL, and vastus lateralis.  03/20/21:  TTP to R iliacus L QL, adductor brevis and pectineus  TODAY: TTO to B diaphragm.  Gait Analysis: (from prior session) Gait speed: 1.36ms ; anterior pelvic tilt, knee hyperextension, heel strike - with cues pelvic neutral achieved, with appropriate arm swing and decreased heel strike.     INTERVENTIONS THIS SESSION: Manual: Performed TP release to TP release internally to all except coccygeus on L and to R anterior and posterior PR/PC internally and Released B diaphragm to decrease spasm and pain and allow for improved balance of musculature for improved function and decreased symptoms.  Self-care:  Educated on ICON underwear and attempted biofeedback with technology failure. Discussed possibility of pessary during pregnancy, encouraged Pt. To try it.  Total time: 60 min.  PT Short Term Goals - 06/13/20 0904      PT SHORT TERM GOAL #1   Title Patient will demonstrate improved pelvic alignment and balance of musculature surrounding the pelvis to facilitate decreased PFM spasms and decrease pressure on nerve roots to allow for optimal PFM function    Baseline Has L foot-drop and pain in L glutes/knee as well as inability to sense PFM and L posterior innominate rotation/spasms surrounding pelvis.    Time 5    Period Weeks    Status Achieved    Target Date 11/26/19      PT SHORT TERM GOAL #2   Title Patient will demonstrate HEP x1 in the clinic to demonstrate understanding and proper form to allow for further improvement.    Baseline Pt. lacks knowledge of therapeutic ecersise that will decrease Sx.    Time 5    Period Weeks    Status Achieved    Target Date 11/26/19      PT SHORT TERM GOAL #3   Title Pt. will be able to walk 250 Ft.with CGA  and w/o assistive device to demonstrate improved balance/decreased instability and foot drop.    Baseline Pt. using RW to AMB due to L foot-drop and increased instability.    Time 5    Period Weeks    Status Achieved    Target Date 08/20/19      PT SHORT TERM GOAL #4   Title Patient will demonstrate a coordinated contraction, relaxation, and bulge of the pelvic floor muscles to demonstrate functional recruitment and motion and allow for further strengthening.    Baseline Pt. reports inability to sense the PFM for kegel or relaxation, having mixed UI    Time 5    Period Weeks    Status Achieved    Target Date 08/20/19             PT Long Term Goals - 10/17/20 0740      PT LONG TERM GOAL #1   Title Patient will report no episodes of UUI/SUI over the course of the prior two weeks to demonstrate improved functional ability.    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 As of 4/30: She had been doing better but then took a step backward when she was sick and vomiting a lot. As of 7/9: Pt. continues to have decreased incontinence but had an episode of total bladder loss and UUI ~ 1-2 times/day still (worsened due to overuse/spasm of LB musculature. As of 11/12: She had one "big accident" and one "snexe-pee" accident over the week.    Time 10    Period Weeks    Status On-going    Target Date 12/26/20      PT LONG TERM GOAL #2   Title Patient will report no pain with intercourse to demonstrate improved functional ability.    Baseline having mild pain with initial penetration As of 1:29: had some pain at initial penetration and with deeper thrusting at ~ 3-4/10 but "not terrible" 10/17/20: has not attempted to know    Time 10    Period Weeks    Status On-going    Target Date 12/26/20      PT LONG TERM GOAL #3   Title Patient will score at or below 22/100 on the UDI and 27/100 on the UIQ to  demonstrate a clinically meaningful decrease in disability and  distress due to pelvic floor dysfunction.    Baseline UDI: 67, UIQ: 72/100 10-19-19: UDI 55.5, UIQ 76/100; POPDI 11/100 As of 1/29: UDI:33/100, UIQ: 6/100, POPDI:6/100. As of 4/30: Due to set-back from vomiting. As of 11/12: UDI: 17/100, UIQ: POPDI: 11/100    Time 10    Period Weeks    Status Achieved    Target Date 08/22/20      PT LONG TERM GOAL #4   Title Pt. 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    Baseline Pt. requiring RW for AMB due to L foot-drop and imbalance. As of 10/12: Pt. requiring some support (e.g. grocery cart) for > 20 min. of AMB or stairs.    Time 10    Period Weeks    Status Achieved    Target Date 11/25/20      PT LONG TERM GOAL #5   Title Pt. will improve in FOTO score by 10 points to demonstrate improved function.    Baseline FOTO PFDI Urinary: 29, Urinary Problem:54    Time 10    Period Weeks    Status New    Target Date 12/26/20      Additional Long Term Goals   Additional Long Term Goals Yes      PT LONG TERM GOAL #6   Title Patient will demonstrate 4+/5 strength or better in the PF and surrounding musculature to show improved functional strength for childcare and other vocational and recreational activities.    Baseline PFM 3/5    Time 10    Period Weeks    Status New    Target Date 12/26/20                 Plan - 04/24/21 0953    Clinical Impression Statement Pt. Responded well to all interventions today, demonstrating decreased spasms and TTP as well as improved PFM recruitment as well as understanding and correct performance of all education and exercises provided today. They will continue to benefit from skilled physical therapy to work toward remaining goals and maximize function as well as decrease likelihood of symptom increase or recurrence.    PT Next Visit Plan  treat R out-flare. re-assess pelvic alignment with heel-lift in place. discuss plan for strengthening and walking program, stabilize gait pattern, discuss benefits of aquatic exercise. Review toe-taps, re-assess PFM, TPDN to R posterior scapular stabilizers and spinal mobilization/ rotational mobs into R rotation and perform PNF for the scapula and rainbows on the Re-assess rib mobility and obliques/diaphragm, review deep-core coordination (tall kneel, knee step through, stepping, lean-backs); re-assess for Piriformis spasm (have performed DN and TP release now); check in with long term HEP performance    PT Home Exercise Plan A day: [place], Side-planks, Planks, Dorothy's, scapular rows and I's, Y's, T's, and W's (temp. rows,sacp retractions, and stretch levator scap on R. Door stretch,B GLO:[id number] with leg lift Child's pose V-sit/ single leg hamstring stretch, side-lying hip ABD and prone hip EXT. Side stretch, modified bridge, Piriformis stretch, frog stretch Every day: Kegels 3-5x/day with long-hold and pelvic tilt with sneeze, laugh/cough kegel, vaginal weights, tall kneeling, tall-kneeling step-through, lean-backs, Corrective: Upper cervical rotationTennis ball releaseadded MET for R anterior for 1 week. Heel-lift for RLE. Pilates videos, self external PFM release.    Consulted and Agree with Plan of Care Patient           Patient will  benefit from skilled therapeutic intervention in order to improve the following deficits and impairments:     Visit Diagnosis: Other muscle spasm  Foot drop, left  Sacrococcygeal disorders, not elsewhere classified     Problem List Patient Active Problem List   Diagnosis Date Noted  . History of vacuum extraction assisted delivery 04/03/2021  . H/O macrosomia in infant in prior pregnancy, currently pregnant 04/03/2021  . AMA (advanced maternal age) multigravida 50+, first trimester 04/03/2021  . Hypermobility arthralgia 02/25/2021  . Positive  urine pregnancy test 02/25/2021  . Chronic pain of right knee 09/17/2020  . Urinary incontinence 08/17/2019  . History of left foot drop 08/17/2019   Willa Rough DPT, ATC Willa Rough 04/24/2021, 10:05 AM  [place] MAIN [facility] SERVICES [address] [place], [place], [address] Phone: [phone]   Fax:  [phone]  Name: Lisa Brady MRN: [id number] Date of Birth: 31-Mar-1986

## 2021-04-29 ENCOUNTER — Encounter: Payer: Self-pay | Admitting: Obstetrics and Gynecology

## 2021-04-29 ENCOUNTER — Other Ambulatory Visit: Payer: Self-pay

## 2021-04-29 ENCOUNTER — Ambulatory Visit (INDEPENDENT_AMBULATORY_CARE_PROVIDER_SITE_OTHER): Payer: No Typology Code available for payment source | Admitting: Obstetrics and Gynecology

## 2021-04-29 VITALS — BP 120/71 | HR 94 | Wt 126.7 lb

## 2021-04-29 DIAGNOSIS — Z4689 Encounter for fitting and adjustment of other specified devices: Secondary | ICD-10-CM

## 2021-04-29 DIAGNOSIS — Z3482 Encounter for supervision of other normal pregnancy, second trimester: Secondary | ICD-10-CM

## 2021-04-29 DIAGNOSIS — O09299 Supervision of pregnancy with other poor reproductive or obstetric history, unspecified trimester: Secondary | ICD-10-CM

## 2021-04-29 DIAGNOSIS — R32 Unspecified urinary incontinence: Secondary | ICD-10-CM | POA: Diagnosis not present

## 2021-04-29 DIAGNOSIS — Z3A16 16 weeks gestation of pregnancy: Secondary | ICD-10-CM

## 2021-04-29 DIAGNOSIS — S39093A Other injury of muscle, fascia and tendon of pelvis, initial encounter: Secondary | ICD-10-CM

## 2021-04-29 LAB — POCT URINALYSIS DIPSTICK OB
Bilirubin, UA: NEGATIVE
Blood, UA: NEGATIVE
Glucose, UA: NEGATIVE
Ketones, UA: NEGATIVE
Leukocytes, UA: NEGATIVE
Nitrite, UA: NEGATIVE
POC,PROTEIN,UA: NEGATIVE
Spec Grav, UA: <=1.005 — AB (ref 1.010–1.025)
Urobilinogen, UA: 0.2 E.U./dL
pH, UA: 7 (ref 5.0–8.0)

## 2021-04-29 NOTE — Progress Notes (Signed)
OB-Pt present for routine prenatal care and pessary fitting. Pt stated having bladder pain and pressure.

## 2021-04-30 ENCOUNTER — Encounter: Payer: Self-pay | Admitting: Obstetrics and Gynecology

## 2021-04-30 NOTE — Progress Notes (Signed)
OB Consultation:   Subjective:    Patient ID: Lisa Brady, female    DOB: March 02, 1986, 35 y.o.   MRN: [id number]  HPI  Patient is a 35 y.o. G68P1001 female at [redacted]w[redacted]d who presents for consultation and pessary fitting.  She has a history of pelvic floor dysfunction, urinary incontinence, history of left foot drop, and urethral trauma after VAVD of macrosomic infant in previous pregnancy.  She has been working extensively with pelvic floor physical therapy over the past year.  She has made moderate improvement, however is still noting issues with urinary leakage, now worsening in pregnancy.  She has some stress and urge incontinence, however also has sporadic spontaneous urinary leakage without activity. 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 She has been evaluated in the past by Urology. Was told that surgical correction could be performed once she completed childbearing.    The following portions of the patient's history were reviewed and updated as appropriate: allergies, current medications, past family history, past medical history, past social history, past surgical history and problem list.  Review of Systems Pertinent items noted in HPI and remainder of comprehensive ROS otherwise negative.   Objective:   Blood pressure 120/71, pulse 94, weight 126 lb 11.2 oz (57.5 kg), last menstrual period 01/06/2021, not currently breastfeeding. General appearance: alert and no distress Abdomen: soft, non-tender; bowel sounds normal; no masses,  no organomegaly. FHT 157 bpm, FH 16 cm.  Pelvic: Pelvic exam:      - VULVA: normal appearing vulva with no masses, tenderness or lesions      - VAGINA: normal appearing vagina with normal color and discharge, no lesions, PELVIC FLOOR EXAM: rectocele Grade 1, cystocele Grade 2.  Positive valsalva test with leakage of urine     - CERVIX: normal appearing cervix without discharge or lesions     - UTERUS: gravid, nontender.      - ADNEXA: not assessed Extremities:  extremities normal, atraumatic, no cyanosis or edema Neurologic: Grossly normal   Assessment:   1. Encounter for supervision of other normal pregnancy in second trimester   2. [redacted] weeks gestation of pregnancy   3. Encounter for fitting and adjustment of pessary   4. H/O macrosomia in infant in prior pregnancy, currently pregnant   5. Urinary incontinence in female   6. Traumatic disruption of pelvic floor muscle     Plan:   1. Discussed option of pessary for management of her urinary incontinence and urethral hypermobility. Is also at risk for some degree of uterine prolapse later in pregnancy.  Pessary fitting performed today.  Fitted with size 4 incontinence dish.  Will order.  2. Patient reports that she would elect for cesarean delivery if current pregnancy has concerns for macrasomia.  3. Continue pelvic floor physical therapy during pregnancy to aid with management of symptoms.    [name], MD [facility]

## 2021-05-01 ENCOUNTER — Ambulatory Visit: Payer: No Typology Code available for payment source

## 2021-05-01 ENCOUNTER — Other Ambulatory Visit: Payer: Self-pay

## 2021-05-01 ENCOUNTER — Encounter: Payer: Self-pay | Admitting: Certified Nurse Midwife

## 2021-05-01 ENCOUNTER — Ambulatory Visit (INDEPENDENT_AMBULATORY_CARE_PROVIDER_SITE_OTHER): Payer: No Typology Code available for payment source | Admitting: Certified Nurse Midwife

## 2021-05-01 VITALS — BP 95/62 | HR 83 | Wt 124.0 lb

## 2021-05-01 DIAGNOSIS — M21372 Foot drop, left foot: Secondary | ICD-10-CM

## 2021-05-01 DIAGNOSIS — Z3A16 16 weeks gestation of pregnancy: Secondary | ICD-10-CM

## 2021-05-01 DIAGNOSIS — Z3482 Encounter for supervision of other normal pregnancy, second trimester: Secondary | ICD-10-CM

## 2021-05-01 DIAGNOSIS — M533 Sacrococcygeal disorders, not elsewhere classified: Secondary | ICD-10-CM

## 2021-05-01 DIAGNOSIS — M62838 Other muscle spasm: Secondary | ICD-10-CM

## 2021-05-01 LAB — POCT URINALYSIS DIPSTICK OB
Bilirubin, UA: NEGATIVE
Blood, UA: NEGATIVE
Glucose, UA: NEGATIVE
Ketones, UA: NEGATIVE
Leukocytes, UA: NEGATIVE
Nitrite, UA: NEGATIVE
POC,PROTEIN,UA: NEGATIVE
Spec Grav, UA: 1.02 (ref 1.010–1.025)
Urobilinogen, UA: 0.2 E.U./dL
pH, UA: 6 (ref 5.0–8.0)

## 2021-05-01 NOTE — Progress Notes (Signed)
ROB-Doing well, no concerns. Pessary fitting completed by Dr. Marcelline Mates earlier this week; for further details, please see previous note. ANATOMY SCAN scheduled for 05/27/2021. Anticipatory guidance regarding course prenatal care. Reviewed red flag symptoms and when to call. RTC x 4 weeks for ROB or sooner if needed.

## 2021-05-01 NOTE — Progress Notes (Signed)
ROB: Doing well no concerns today.

## 2021-05-01 NOTE — Therapy (Signed)
[place] MAIN [facility] SERVICES [address] [place], [place], [address] Phone: [phone]   Fax:  [phone]  Physical Therapy Progress Note   Dates of reporting period  10/17/21   to   05/01/21   The patient has been informed of current processes in place at [facility]patient [facility] to protect patients from Covid-19 exposure including social distancing, schedule modifications, and new cleaning procedures. After discussing their particular risk with a therapist based on the patient's personal risk factors, the patient has decided to proceed with in-person therapy.  Patient Details  Name: Lisa Brady MRN: [id number] Date of Birth: 1986-04-14 No data recorded  Encounter Date: 05/01/2021   PT End of Session - 05/01/21 1307    Visit Number 63    Number of Visits 67    Date for PT Re-Evaluation 12/26/20    Authorization Type [place]    Authorization - Visit Number 10    Authorization - Number of Visits 10    Progress Note Due on Visit 10    PT Start Time 0730    PT Stop Time 0830    PT Time Calculation (min) 60 min    Activity Tolerance Patient tolerated treatment well;No increased pain    Behavior During Therapy WFL for tasks assessed/performed           Past Medical History:  Diagnosis Date  . Dysplastic nevus 04/01/2021   right medial forearm, mod Atypia   . Dysplastic nevus 04/01/2021   right inferior axilla, mild atypia   . Dysplastic nevus 04/01/2021   right superior medial scapula, mild atypia   . Foot drop   . Hypermobility arthralgia   . Maternal varicella, non-immune 11/20/2018  . Scoliosis   . Urinary incontinence     Past Surgical History:  Procedure Laterality Date  . WISDOM TOOTH EXTRACTION  2006    There were no vitals filed for this visit.  Pelvic Floor Physical Therapy Treatment Note  SCREENING  Changes in medications, allergies, or medical history?: pregnant    SUBJECTIVE  Patient reports: She  was Fitted for her pessary on Wednesday. 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 She is still having to wear pads. One day she had to change the pad but most the time has not been high volume. She has had a little mid-back pain but thinks it is probably because she has not been doing her PT exercises due to having a sick child, etc.  Precautions:  She is [redacted] weeks pregnant,  Hypermobility and Scoliosis  Pain update:  Location of pain:  Low back hip/side (neck) Current pain:  1-2/10 (0/10 with turning) Max pain:  2/10  (3/10) Least pain:  0/10 (0/10) Nature of pain: tightness/achy  **no increased pain following treatment   Patient Goals: Control her bladder/not need depends.   OBJECTIVE  Changes in:   Observation:  Slight R anterior innominate rotation (from previous session)  R scapula protracted and winging>L (from prior session)  12/26/20: alignment not assessed before MMT performed, audble "pop" when performing MMT for L adduction and then pelvis assessed and appropriately aligned in supine.  01/09/21: Pt. Demonstrates R anterior rotation, L up-slip and LLD with LLE long was finally detected. **PSIS appear level in standing with 2/3 heel-lift in R shoe.   03/06/21: mild R anterior rotation, resolved following treatment.  03/20/21: L up-slip and R anterior/L posterior rotation, possible R out-flare.  **  following treatment pubic symphysis level, up-slip corrected but R anterion rotation remaining.   04/17/21: R out-flare.  TODAY: not re-assessed  ROM/Mobility:  Decreased scapular depression and downward rotation ROM in R>L scapula. Decreased L cervical rotation with painful end-feel. Decreased mobility at pubic symphysis and pain with pressure at L Pubic ramus  (from prior session)  Pt demonstrates decreased upward rotation ROM for R scapula due to spasm/ fascial tightness that is limiting  proprioception and recruitment for effective downward scapular rotation strengthening. (from previous session)   Strength:  Muscle fatigue reached earlier on R>L with tall-kneeling/balance exercises. Imbalance surrounding R scapula not allowing Pt. To perform I's, Y's, T's W's well. (from prior session)  MMT LE  - L DF 4+/5 (different rater may account for difference but could be due to spasms in back since test performed prior to DN/manual) - R DF 5/5 (from prior session)  Strength/MMT:  LE MMT (12/26/20) LE MMT Left Right  Hip flex:  (L2) /5 /5  Hip ext: 5/5 5/5  Hip abd: 4+/5 5/5  Hip add: 4+/5 5/5  Hip IR 4/5 4+/5  Hip ER 5/5 5/5  *audible "pop" with mild discomfort noted with resisted L adduction.  (02-06-21) LE MMT Left Right  Hip flex:  (L2) /5 /5  Hip ext: -5/5 5/5  Hip abd: 5/5 5/5  Hip add: 4+/5 4/5!  Hip IR 4+/5 4+/5  Hip ER 4++/5 4++/5    Deep-core strength/endurance sufficient to allow Pt. To perform toe-taps x5 in a row before rest needed.  As of 05/01/21: Pt. Demonstrated weakness in DF in fatiguing during BLE plantarflexion at 23 repetitions.  Pelvic floor: TTP through R posterior PR/PC and OI as well as 5 o'clock region of posterior fourchette. TTP to to L posterior fourchette at ~ 5 o'clock. Coordinated and able to keep probe in even at ~ 45 degrees. Increased force production when TA and obliques recruited. ~ 3+/5 strength and maximal endurance at ~ 8 seconds.  (from prior session)  1/7:  Strength ~ 2+/5 initially with TTP to anterior and posterior PR/PC as well as decreased fascial mobility along anterior/inferior aspect of bladder.  **following treatment Pt. Able to achieve 3++/5 with decreased recruitment of the posterior PR/PC and IC near coccyx remaining. (from previous session)  01/09/21: TTP to R>L coccygeus, OI, and IC near coccyx. Strength ~ 3/5 and 3 sec. endurance with poor recruitment of posterior PFM.  **following treatment, strength ~ 3++/5 with  much more squeeze from posterior PFM and ~ 8 sec. Hold.  04/03/21: TTP to B STP and bulbo as well as EUS and decreased mobility through 2nd layer fascia on R>L.  04/17/20: TTP to all except coccygeus on L and to R anterior and posterior PR/PC. 2/5 at BOS, Pt. demonstrated 3+/ 5 strength at EOS.  Palpation:  Greatest tenderness with pressure on spinous processes T5 and L2. (from previous session)  02/06/21: TTP to R TFL, iliopsoas, vastus lateralis and rectus femoris  03/06/21: TTP to R Iliacus, Psoas, TFL, and vastus lateralis.  03/20/21:  TTP to R iliacus L QL, adductor brevis and pectineus  04/17/21: TTP to B diaphragm.  Gait Analysis: (from prior session) Gait speed: 1.76ms ; anterior pelvic tilt, knee hyperextension, heel strike - with cues pelvic neutral achieved, with appropriate arm swing and decreased heel strike.     INTERVENTIONS THIS SESSION: Therex: Reviewed HEP for appropriateness as pregnancy progresses and condensed/progressed/regressed as appropriate. Added tall kneeling with kegel, calf-raises, and multi-direction stepping to  address concern of leakage with direction change and to strengthen PFM in functional position without causing increased UI/Pop Sx. Felt in half-kneeling.  Self-care: Re-assessed plan based on pregnancy and plan to use pessary. Discussed focusing on strengthening core and postural muscles to prevent return of spasms/minimize impact of pregnancy on physical health. Discussed the deep front facial chain and it's role in her remaining Sx.   Total time: 60 min.                                 PT Short Term Goals - 05/01/21 1313      PT SHORT TERM GOAL #1   Title Patient will demonstrate improved pelvic alignment and balance of musculature surrounding the pelvis to facilitate decreased PFM spasms and decrease pressure on nerve roots to allow for optimal PFM function    Baseline Has L foot-drop and pain in L glutes/knee as well as  inability to sense PFM and L posterior innominate rotation/spasms surrounding pelvis.    Time 5    Period Weeks    Status Achieved    Target Date 11/26/19      PT SHORT TERM GOAL #2   Title Patient will demonstrate HEP x1 in the clinic to demonstrate understanding and proper form to allow for further improvement.    Baseline Pt. lacks knowledge of therapeutic ecersise that will decrease Sx.    Time 5    Period Weeks    Status Achieved    Target Date 11/26/19      PT SHORT TERM GOAL #3   Title Pt. will be able to walk 250 Ft.with CGA and w/o assistive device to demonstrate improved balance/decreased instability and foot drop.    Baseline Pt. using RW to AMB due to L foot-drop and increased instability.    Time 5    Period Weeks    Status Achieved    Target Date 08/20/19      PT SHORT TERM GOAL #4   Title Patient will demonstrate a coordinated contraction, relaxation, and bulge of the pelvic floor muscles to demonstrate functional recruitment and motion and allow for further strengthening.    Baseline Pt. reports inability to sense the PFM for kegel or relaxation, having mixed UI    Time 5    Period Weeks    Status Achieved    Target Date 08/20/19      PT SHORT TERM GOAL #5   Title Pt. will be able to demonstrate tall/half kneeling exercises for 10-15 seconds without increased pelvic pressure/UI sensation.    Baseline Pt. has avoided half-kneeling exercises because they increased Sx.    Time 6    Period Weeks    Status New    Target Date 06/12/21             PT Long Term Goals - 05/01/21 1313      PT LONG TERM GOAL #1   Title Patient will report no episodes of UUI/SUI over the course of the prior two weeks to demonstrate improved functional ability.    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 As of 4/30: She had been doing better but then took a step backward when she was sick  and vomiting a lot. As of 7/9: Pt. continues to have decreased incontinence but had an episode of total bladder  loss and UUI ~ 1-2 times/day still (worsened due to overuse/spasm of LB musculature. As of 11/12: She had one "big accident" and one "snexe-pee" accident over the week. As of 05/01/21: Pt. has had an increase in Sx. since becoming pregnant and has been fit for a pessary which she will begin using soon to prevent further UI worsening over the course of her pregnancy along with further PFM strengthening.    Time 10    Period Weeks    Status On-going    Target Date 07/10/21      PT LONG TERM GOAL #2   Title Patient will report no pain with intercourse to demonstrate improved functional ability.    Baseline having mild pain with initial penetration As of 1:29: had some pain at initial penetration and with deeper thrusting at ~ 3-4/10 but "not terrible" 10/17/20: has not attempted to know As of 05/01/21: Pt. had some radiating pain with pessary fitting even following release the prior Friday, reports "not as bad as it could've been".    Time 10    Period Weeks    Status On-going    Target Date 07/10/21      PT LONG TERM GOAL #3   Title Patient will score at or below 22/100 on the UDI and 27/100 on the UIQ to demonstrate a clinically meaningful decrease in disability and distress due to pelvic floor dysfunction.    Baseline UDI: 67, UIQ: 72/100 10-19-19: UDI 55.5, UIQ 76/100; POPDI 11/100 As of 1/29: UDI:33/100, UIQ: 6/100, POPDI:6/100. As of 4/30: Due to set-back from vomiting. As of 11/12: UDI: 17/100, UIQ: POPDI: 11/100    Time 10    Period Weeks    Status Achieved    Target Date 08/22/20      PT LONG TERM GOAL #4   Title Pt. 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    Baseline Pt. requiring RW for AMB due to L foot-drop and imbalance. As of 10/12: Pt. requiring some support  (e.g. grocery cart) for > 20 min. of AMB or stairs.    Time 10    Period Weeks    Status Achieved    Target Date 11/25/20      PT LONG TERM GOAL #5   Title Pt. will improve in FOTO score by 10 points to demonstrate improved function.    Baseline FOTO PFDI Urinary: 29, Urinary Problem:54    Time 10    Period Weeks    Status On-going    Target Date 07/10/21      PT LONG TERM GOAL #6   Title Patient will demonstrate 4+/5 strength or better in the PF and surrounding musculature to show improved functional strength for childcare and other vocational and recreational activities.    Baseline PFM 3/5, As of 04/17/21: 3+/5.    Time 10    Period Weeks    Status On-going    Target Date 07/10/21                 Plan - 05/01/21 1311    Clinical Impression Statement Pt. had nearly met her goals but has had a set-back in her UI since becoming pregnant and following back-to-back illnesses causing vomiting and coughing and worsening POP and UI Sx. She has been fit for a pessary which she will begin using soon but she continues to demonstrate poor postural stability  and with her hypermobility at baseline plus scoliosis she will benefit from further strengthening to minimize chances of increasing pain and worsening of UI and dyspareunia. We discovered today that she demonstrates weakness in plantarflexion that is likely contributing to her recurrent adductor spasms and difficulty maintaining PFM relaxation and recruitment due to the effect throught the deep front fascial chain and we have made adjustments to her HEP to allow for continued use throughout her pregnancy and fit better wit her work/life balance to maximize adherance. We will continue on an every-other week basis for 10 weeks to continue working toward original goals and new goals and prevent/minimize worsening of Sx. during her current pregnancy.    Personal Factors and Comorbidities Comorbidity 2    Examination-Activity Limitations  Locomotion Level;Stand;Stairs;Lift;Squat;Toileting;Continence;Sit;Transfers;Caring for Others;Bend    Examination-Participation Restrictions Interpersonal Relationship;Yard Work;Community Activity;Meal Prep;Laundry    Stability/Clinical Decision Making Evolving/Moderate complexity    Clinical Decision Making Moderate    Rehab Potential Good    PT Frequency 1x / week    PT Duration Other (comment)    PT Treatment/Interventions ADLs/Self Care Home Management;Aquatic Therapy;Biofeedback;Electrical Stimulation;Gait training;Stair training;Balance training;Therapeutic exercise;Therapeutic activities;Functional mobility training;Neuromuscular re-education;Patient/family education;Manual techniques;Scar mobilization;Dry needling;Taping;Spinal Manipulations;Joint Manipulations    PT Next Visit Plan practice multi-stepping, release adductors, re-assess pelvic alignment with heel-lift in place. discuss plan for strengthening and walking program, stabilize gait pattern, discuss benefits of aquatic exercise. Review toe-taps, re-assess PFM, TPDN to R posterior scapular stabilizers and spinal mobilization/ rotational mobs into R rotation and perform PNF for the scapula and rainbows on the Re-assess rib mobility and obliques/diaphragm, review deep-core coordination (tall kneel, knee step through, stepping, lean-backs); re-assess for Piriformis spasm (have performed DN and TP release now); check in with long term HEP performance    PT Home Exercise Plan day: Teapot, Side-planks, Planks, rows, scap retractions, (blue band). Mini-Marches with leg lift,  standing hip ABD and EXT, modified bridge, tall kneeling. lean-backs, calf raises.     B day: Child's pose V-sit/ single leg hamstring stretch,. Side stretch, Piriformis stretch, Door stretch,  stretch levator scap (neck) on R    Every day: Kegels 3-5x/day with long-hold and pelvic tilt with sneeze, laugh/cough kegel, vaginal weights, mult-direction stepping.         Corrective: Upper cervical rotation Tennis ball release added MET for R anterior for 1 week. Heel-lift for RLE. self external PFM release.    Consulted and Agree with Plan of Care Patient           Patient will benefit from skilled therapeutic intervention in order to improve the following deficits and impairments:  Abnormal gait,Decreased balance,Decreased endurance,Difficulty walking,Increased muscle spasms,Impaired tone,Improper body mechanics,Decreased activity tolerance,Decreased coordination,Decreased strength,Hypermobility,Postural dysfunction,Pain  Visit Diagnosis: Other muscle spasm  Foot drop, left  Sacrococcygeal disorders, not elsewhere classified     Problem List Patient Active Problem List   Diagnosis Date Noted  . History of vacuum extraction assisted delivery 04/03/2021  . H/O macrosomia in infant in prior pregnancy, currently pregnant 04/03/2021  . AMA (advanced maternal age) multigravida 91+, first trimester 04/03/2021  . Hypermobility arthralgia 02/25/2021  . Positive urine pregnancy test 02/25/2021  . Chronic pain of right knee 09/17/2020  . Urinary incontinence 08/17/2019  . History of left foot drop 08/17/2019   Willa Rough DPT, ATC Willa Rough 05/01/2021, 2:41 PM  [place] [facility] SERVICES [address] [place], [place], [address] Phone: [phone]   Fax:  [phone]  Name: Lisa Brady  MRN: [id number] Date of Birth: 1986-09-04

## 2021-05-01 NOTE — Patient Instructions (Addendum)
A day: Teapot, Side-planks, Planks, rows, scap retractions, (blue band). Mini-Marches with leg lift,  standing hip ABD and EXT, modified bridge, tall kneeling. lean-backs, calf raises.   B day: Child's pose V-sit/ single leg hamstring stretch,. Side stretch, Piriformis stretch, Door stretch,  stretch levator scap (neck) on R  Every day: Kegels 3-5x/day with long-hold and pelvic tilt with sneeze, laugh/cough kegel, vaginal weights, mult-direction stepping.     Corrective: Upper cervical rotation Tennis ball release added MET for R anterior for 1 week. Heel-lift for RLE. self external PFM release.   Activate and draw up the pelvic floor and tuck pelvis slightly under by squeezing the lower tummy muscles and glutes. Hold up to 10 seconds while ( add swinging arms when easy), resting when the PF becomes tired. Repeat _5__ times, __2__ times per day.    Do 3 sets of 20 with both feet on "A" days and then after 8 weeks start doing 2x15 with each leg individually.            Stand with equal weight on both feet engage the pelvic floor and lower abdomen and then lunge with the same leg 5 times in each direction, keeping foot forward and balancing for a second before placing the foot down between repetitions.  _3__ reps __1_ times per day.  Hip Abduction (Standing)    Stand with support. Squeeze deep core and hold. Start with leg to the side/back on your toe and gently squeeze the outer hip to lift the toe off of the ground. Hold for 1 second then repeat. You should feel this in the outer hip, not the front, not your side,  And not your back. Repeat __10_ times. Do _3__ sets  a day.  HIP Extension - Standing    Stand with support. Squeeze deep core and hold. Start with leg to the back on your toe and gently squeeze the buttock to lift the toe off of the ground. Hold for 1 second then repeat. You should feel this in the outer hip, not the front, not your side,  And not your back. Repeat  __10_ times. Do _3__ sets  a day.

## 2021-05-01 NOTE — Patient Instructions (Signed)

## 2021-05-08 ENCOUNTER — Ambulatory Visit: Payer: No Typology Code available for payment source

## 2021-05-08 ENCOUNTER — Ambulatory Visit: Payer: No Typology Code available for payment source | Attending: Obstetrics and Gynecology

## 2021-05-08 ENCOUNTER — Other Ambulatory Visit: Payer: Self-pay

## 2021-05-08 DIAGNOSIS — M533 Sacrococcygeal disorders, not elsewhere classified: Secondary | ICD-10-CM | POA: Diagnosis present

## 2021-05-08 DIAGNOSIS — M21372 Foot drop, left foot: Secondary | ICD-10-CM | POA: Diagnosis present

## 2021-05-08 DIAGNOSIS — M62838 Other muscle spasm: Secondary | ICD-10-CM | POA: Insufficient documentation

## 2021-05-08 NOTE — Therapy (Signed)
[place] MAIN [facility] [address] [place], [place], [address] Phone: [phone]   Fax:  [phone]  Physical Therapy Treatment  The patient has been informed of current processes in place at Outpatient Rehab to protect patients from Covid-19 exposure including social distancing, schedule modifications, and new cleaning procedures. After discussing their particular risk with a therapist based on the patient's personal risk factors, the patient has decided to proceed with in-person therapy.   Patient Details  Name: Lisa Brady MRN: [id number] Date of Birth: 03-13-1986 No data recorded  Encounter Date: 05/08/2021   PT End of Session - 05/08/21 0834    Visit Number 67    Number of Visits 26    Date for PT Re-Evaluation 07/10/21    Authorization Type Mier    Authorization - Visit Number 1    Authorization - Number of Visits 10    Progress Note Due on Visit 20    PT Start Time 9767    PT Stop Time 3419    PT Time Calculation (min) 60 min    Activity Tolerance Patient tolerated treatment well;No increased pain    Behavior During Therapy WFL for tasks assessed/performed           Past Medical History:  Diagnosis Date  . Dysplastic nevus 04/01/2021   right medial forearm, mod Atypia   . Dysplastic nevus 04/01/2021   right inferior axilla, mild atypia   . Dysplastic nevus 04/01/2021   right superior medial scapula, mild atypia   . Foot drop   . Hypermobility arthralgia   . Maternal varicella, non-immune 11/20/2018  . Scoliosis   . Urinary incontinence     Past Surgical History:  Procedure Laterality Date  . WISDOM TOOTH EXTRACTION  2006    There were no vitals filed for this visit.  Pelvic Floor Physical Therapy Treatment Note  SCREENING  Changes in medications, allergies, or medical history?: pregnant    SUBJECTIVE  Patient reports: Her glutes and legs were "killing me for like 3 days". 3 sets of  20 calf raises is really hard. She feels unsure about the side-stepping but good with the forward stepping. She coughed and sneezed multiple times in a row and had to change her pad but other than that it has been more little leaks. She has not had a lot of urgency that she can remember.    Precautions:  She is [redacted] weeks pregnant,  Hypermobility and Scoliosis  Pain update:  Location of pain:  Low back hip/side (neck) Current pain:  0/10 (0/10 with turning) Max pain:  2/10  (0/10) Least pain:  0/10 (0/10) Nature of pain: tightness/achy  **no increased pain following treatment   Patient Goals: Control her bladder/not need depends.   OBJECTIVE  Changes in:   Observation:  Slight R anterior innominate rotation (from previous session)  R scapula protracted and winging>L (from prior session)  12/26/20: alignment not assessed before MMT performed, audble "pop" when performing MMT for L adduction and then pelvis assessed and appropriately aligned in supine.  01/09/21: Pt. Demonstrates R anterior rotation, L up-slip and LLD with LLE long was finally detected. **PSIS appear level in standing with 2/3 heel-lift in R shoe.   03/06/21: mild R anterior rotation, resolved following treatment.  03/20/21: L up-slip and R anterior/L posterior rotation, possible R out-flare.  **following treatment pubic symphysis level, up-slip corrected but R anterion rotation remaining.   04/17/21: R out-flare.  TODAY: TTP to  B IC and coccygeus. Sytrnge ~ 2/5 in standing and 2+/5 in supine at BOS. ~ 3+ following treatment in supine, Pt. Able to sense in standing following treatment.   ROM/Mobility:  Decreased scapular depression and downward rotation ROM in R>L scapula. Decreased L cervical rotation with painful end-feel. Decreased mobility at pubic symphysis and pain with pressure at L Pubic ramus  (from prior session)  Pt demonstrates decreased upward rotation ROM for R scapula due to spasm/ fascial tightness  that is limiting proprioception and recruitment for effective downward scapular rotation strengthening. (from previous session)   Strength:  Muscle fatigue reached earlier on R>L with tall-kneeling/balance exercises. Imbalance surrounding R scapula not allowing Pt. To perform I's, Y's, T's W's well. (from prior session)  MMT LE  - L DF 4+/5 (different rater may account for difference but could be due to spasms in back since test performed prior to DN/manual) - R DF 5/5 (from prior session)  Strength/MMT:  LE MMT (12/26/20) LE MMT Left Right  Hip flex:  (L2) /5 /5  Hip ext: 5/5 5/5  Hip abd: 4+/5 5/5  Hip add: 4+/5 5/5  Hip IR 4/5 4+/5  Hip ER 5/5 5/5  *audible "pop" with mild discomfort noted with resisted L adduction.  (02-06-21) LE MMT Left Right  Hip flex:  (L2) /5 /5  Hip ext: -5/5 5/5  Hip abd: 5/5 5/5  Hip add: 4+/5 4/5!  Hip IR 4+/5 4+/5  Hip ER 4++/5 4++/5    Deep-core strength/endurance sufficient to allow Pt. To perform toe-taps x5 in a row before rest needed.  As of 05/01/21: Pt. Demonstrated weakness in DF in fatiguing during BLE plantarflexion at 23 repetitions.  Pelvic floor: TTP through R posterior PR/PC and OI as well as 5 o'clock region of posterior fourchette. TTP to to L posterior fourchette at ~ 5 o'clock. Coordinated and able to keep probe in even at ~ 45 degrees. Increased force production when TA and obliques recruited. ~ 3+/5 strength and maximal endurance at ~ 8 seconds.  (from prior session)  1/7:  Strength ~ 2+/5 initially with TTP to anterior and posterior PR/PC as well as decreased fascial mobility along anterior/inferior aspect of bladder.  **following treatment Pt. Able to achieve 3++/5 with decreased recruitment of the posterior PR/PC and IC near coccyx remaining. (from previous session)  01/09/21: TTP to R>L coccygeus, OI, and IC near coccyx. Strength ~ 3/5 and 3 sec. endurance with poor recruitment of posterior PFM.  **following treatment,  strength ~ 3++/5 with much more squeeze from posterior PFM and ~ 8 sec. Hold.  04/03/21: TTP to B STP and bulbo as well as EUS and decreased mobility through 2nd layer fascia on R>L.  04/17/20: TTP to all except coccygeus on L and to R anterior and posterior PR/PC. 2/5 at BOS, Pt. demonstrated 3+/ 5 strength at EOS.  Palpation:  Greatest tenderness with pressure on spinous processes T5 and L2. (from previous session)  02/06/21: TTP to R TFL, iliopsoas, vastus lateralis and rectus femoris  03/06/21: TTP to R Iliacus, Psoas, TFL, and vastus lateralis.  03/20/21:  TTP to R iliacus L QL, adductor brevis and pectineus  04/17/21: TTP to B diaphragm.  Gait Analysis: (from prior session) Gait speed: 1.12m/s ; anterior pelvic tilt, knee hyperextension, heel strike - with cues pelvic neutral achieved, with appropriate arm swing and decreased heel strike.     INTERVENTIONS THIS SESSION: Therex: Discussed breaking up sets of calf-raises to make keep reps high without  losing form. Reviewed multi-stepping and added backward step practiced ~ 1x5 in each direction B.   Manual: Re-assessed strength of PFM in standing and supine and released B IC and coccygeus to decrease spasm and pain and allow for improved balance of musculature for improved function and decreased symptoms.   Total time: 60 min.                               PT Short Term Goals - 05/01/21 1313      PT SHORT TERM GOAL #1   Title Patient will demonstrate improved pelvic alignment and balance of musculature surrounding the pelvis to facilitate decreased PFM spasms and decrease pressure on nerve roots to allow for optimal PFM function    Baseline Has L foot-drop and pain in L glutes/knee as well as inability to sense PFM and L posterior innominate rotation/spasms surrounding pelvis.    Time 5    Period Weeks    Status Achieved    Target Date 11/26/19      PT SHORT TERM GOAL #2   Title Patient will demonstrate  HEP x1 in the clinic to demonstrate understanding and proper form to allow for further improvement.    Baseline Pt. lacks knowledge of therapeutic ecersise that will decrease Sx.    Time 5    Period Weeks    Status Achieved    Target Date 11/26/19      PT SHORT TERM GOAL #3   Title Pt. will be able to walk 250 Ft.with CGA and w/o assistive device to demonstrate improved balance/decreased instability and foot drop.    Baseline Pt. using RW to AMB due to L foot-drop and increased instability.    Time 5    Period Weeks    Status Achieved    Target Date 08/20/19      PT SHORT TERM GOAL #4   Title Patient will demonstrate a coordinated contraction, relaxation, and bulge of the pelvic floor muscles to demonstrate functional recruitment and motion and allow for further strengthening.    Baseline Pt. reports inability to sense the PFM for kegel or relaxation, having mixed UI    Time 5    Period Weeks    Status Achieved    Target Date 08/20/19      PT SHORT TERM GOAL #5   Title Pt. will be able to demonstrate tall/half kneeling exercises for 10-15 seconds without increased pelvic pressure/UI sensation.    Baseline Pt. has avoided half-kneeling exercises because they increased Sx.    Time 6    Period Weeks    Status New    Target Date 06/12/21             PT Long Term Goals - 05/01/21 1313      PT LONG TERM GOAL #1   Title Patient will report no episodes of UUI/SUI over the course of the prior two weeks to demonstrate improved functional ability.    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 As of 4/30: She had been doing better but then took a step backward when she was sick and vomiting a lot. As of 7/9: Pt. continues to have decreased incontinence but had an episode of total bladder loss and UUI ~ 1-2 times/day still (worsened due to overuse/spasm of LB musculature. As of 11/12: She had  one  "big accident" and one "snexe-pee" accident over the week. As of 05/01/21: Pt. has had an increase in Sx. since becoming pregnant and has been fit for a pessary which she will begin using soon to prevent further UI worsening over the course of her pregnancy along with further PFM strengthening.    Time 10    Period Weeks    Status On-going    Target Date 07/10/21      PT LONG TERM GOAL #2   Title Patient will report no pain with intercourse to demonstrate improved functional ability.    Baseline having mild pain with initial penetration As of 1:29: had some pain at initial penetration and with deeper thrusting at ~ 3-4/10 but "not terrible" 10/17/20: has not attempted to know As of 05/01/21: Pt. had some radiating pain with pessary fitting even following release the prior Friday, reports "not as bad as it could've been".    Time 10    Period Weeks    Status On-going    Target Date 07/10/21      PT LONG TERM GOAL #3   Title Patient will score at or below 22/100 on the UDI and 27/100 on the UIQ to demonstrate a clinically meaningful decrease in disability and distress due to pelvic floor dysfunction.    Baseline UDI: 67, UIQ: 72/100 10-19-19: UDI 55.5, UIQ 76/100; POPDI 11/100 As of 1/29: UDI:33/100, UIQ: 6/100, POPDI:6/100. As of 4/30: Due to set-back from vomiting. As of 11/12: UDI: 17/100, UIQ: POPDI: 11/100    Time 10    Period Weeks    Status Achieved    Target Date 08/22/20      PT LONG TERM GOAL #4   Title Pt. 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    Baseline Pt. requiring RW for AMB due to L foot-drop and imbalance. As of 10/12: Pt. requiring some support (e.g. grocery cart) for > 20 min. of AMB or stairs.    Time 10    Period Weeks    Status Achieved    Target Date 11/25/20      PT LONG TERM GOAL #5   Title Pt. will improve in FOTO score by 10 points to demonstrate  improved function.    Baseline FOTO PFDI Urinary: 29, Urinary Problem:54    Time 10    Period Weeks    Status On-going    Target Date 07/10/21      PT LONG TERM GOAL #6   Title Patient will demonstrate 4+/5 strength or better in the PF and surrounding musculature to show improved functional strength for childcare and other vocational and recreational activities.    Baseline PFM 3/5, As of 04/17/21: 3+/5.    Time 10    Period Weeks    Status On-going    Target Date 07/10/21                 Plan - 05/08/21 0835    Clinical Impression Statement Pt. Responded well to all interventions today, demonstrating decreased spasm, improved performance of HEP and PFM recruitment/strength, as well as understanding and correct performance of all education and exercises provided today. They will continue to benefit from skilled physical therapy to work toward remaining goals and maximize function as well as decrease likelihood of symptom increase or recurrence.    PT Next Visit Plan practice multi-stepping, release adductors, re-assess  pelvic alignment with heel-lift in place. discuss plan for strengthening and walking program, stabilize gait pattern, discuss benefits of aquatic exercise. Review toe-taps, re-assess PFM, TPDN to R posterior scapular stabilizers and spinal mobilization/ rotational mobs into R rotation and perform PNF for the scapula and rainbows on the Re-assess rib mobility and obliques/diaphragm, review deep-core coordination (tall kneel, knee step through, stepping, lean-backs); re-assess for Piriformis spasm (have performed DN and TP release now); check in with long term HEP performance    PT Home Exercise Plan day: Teapot, Side-planks, Planks, rows, scap retractions, (blue band). Mini-Marches with leg lift,  standing hip ABD and EXT, modified bridge, tall kneeling. lean-backs, calf raises.     B day: Child's pose V-sit/ single leg hamstring stretch,. Side stretch, Piriformis stretch,  Door stretch,  stretch levator scap (neck) on R    Every day: Kegels 3-5x/day with long-hold and pelvic tilt with sneeze, laugh/cough kegel, vaginal weights, mult-direction stepping.        Corrective: Upper cervical rotation Tennis ball release added MET for R anterior for 1 week. Heel-lift for RLE. self external PFM release.    Consulted and Agree with Plan of Care Patient           Patient will benefit from skilled therapeutic intervention in order to improve the following deficits and impairments:     Visit Diagnosis: Other muscle spasm  Foot drop, left  Sacrococcygeal disorders, not elsewhere classified     Problem List Patient Active Problem List   Diagnosis Date Noted  . History of vacuum extraction assisted delivery 04/03/2021  . H/O macrosomia in infant in prior pregnancy, currently pregnant 04/03/2021  . AMA (advanced maternal age) multigravida 36+, first trimester 04/03/2021  . Hypermobility arthralgia 02/25/2021  . Positive urine pregnancy test 02/25/2021  . Chronic pain of right knee 09/17/2020  . Urinary incontinence 08/17/2019  . History of left foot drop 08/17/2019   Willa Rough DPT, ATC Willa Rough 05/08/2021, 8:38 AM  [place] MAIN [facility] SERVICES [address] [place], [place], [address] Phone: [phone]   Fax:  [phone]  Name: Lisa Brady MRN: [id number] Date of Birth: 1985-12-19

## 2021-05-22 ENCOUNTER — Ambulatory Visit: Payer: No Typology Code available for payment source

## 2021-05-22 ENCOUNTER — Other Ambulatory Visit: Payer: Self-pay

## 2021-05-22 DIAGNOSIS — M21372 Foot drop, left foot: Secondary | ICD-10-CM

## 2021-05-22 DIAGNOSIS — M62838 Other muscle spasm: Secondary | ICD-10-CM | POA: Diagnosis not present

## 2021-05-22 DIAGNOSIS — M533 Sacrococcygeal disorders, not elsewhere classified: Secondary | ICD-10-CM

## 2021-05-22 NOTE — Therapy (Signed)
[place] MAIN [facility] SERVICES [address] [place], [place], [address] Phone: [phone]   Fax:  [phone]  Physical Therapy Treatment  The patient has been informed of current processes in place at [facility]patient [facility] to protect patients from Covid-19 exposure including social distancing, schedule modifications, and new cleaning procedures. After discussing their particular risk with a therapist based on the patient's personal risk factors, the patient has decided to proceed with in-person therapy.   Patient Details  Name: Lisa Brady MRN: [id number] Date of Birth: 10-23-1986 No data recorded  Encounter Date: 05/22/2021    Past Medical History:  Diagnosis Date   Dysplastic nevus 04/01/2021   right medial forearm, mod Atypia    Dysplastic nevus 04/01/2021   right inferior axilla, mild atypia    Dysplastic nevus 04/01/2021   right superior medial scapula, mild atypia    Foot drop    Hypermobility arthralgia    Maternal varicella, non-immune 11/20/2018   Scoliosis    Urinary incontinence     Past Surgical History:  Procedure Laterality Date   WISDOM TOOTH EXTRACTION  2006    There were no vitals filed for this visit.  Pelvic Floor Physical Therapy Treatment Note  SCREENING  Changes in medications, allergies, or medical history?: pregnant    SUBJECTIVE  Patient reports: She threw up 3 times on the plane back from [place] and had some leakage with that but was able to walk ~ 10000 steps each day but she didn't have much leakage. She felt like her short leg was acting long yesterday at work. Has had a litlle more LBP. In general things have been a little better the last couple weeks. Still has not been using the pessary yet. Wearing a pad mostly out of fear of leakage. Using a stayfree period pad. Bought a couple pairs of speaks incontinence underwear.   Precautions:  She is [redacted] weeks pregnant,  Hypermobility and  Scoliosis  Pain update:  Location of pain:  Low back hip/side (neck) Current pain:  0/10 (0/10 with turning) Max pain:  2/10  (0/10) Least pain:  0/10 (0/10) Nature of pain: tightness/achy  **no increased pain following treatment   Patient Goals: Control her bladder/not need depends.   OBJECTIVE  Changes in:   Observation:  Slight R anterior innominate rotation (from previous session)  R scapula protracted and winging>L (from prior session)  12/26/20: alignment not assessed before MMT performed, audble "pop" when performing MMT for L adduction and then pelvis assessed and appropriately aligned in supine.  01/09/21: Pt. Demonstrates R anterior rotation, L up-slip and LLD with LLE long was finally detected. **PSIS appear level in standing with 2/3 heel-lift in R shoe.   03/06/21: mild R anterior rotation, resolved following treatment.  03/20/21: L up-slip and R anterior/L posterior rotation, possible R out-flare.  **following treatment pubic symphysis level, up-slip corrected but R anterion rotation remaining.   04/17/21: R out-flare.  05/08/21: TTP to B IC and coccygeus. Sytrnge ~ 2/5 in standing and 2+/5 in supine at BOS. ~ 3+ following treatment in supine, Pt. Able to sense in standing following treatment.   TODAY: ASIS and PSIS appeared level in standing but in supine R ASIS low and ankles level (RLE is short-leg). At EOS ASIS level and RLE short (appropriate) in supine.  ROM/Mobility:  Decreased scapular depression and downward rotation ROM in R>L scapula. Decreased L cervical rotation with painful end-feel. Decreased mobility at pubic symphysis and pain with pressure at  L Pubic ramus  (from prior session)  Pt demonstrates decreased upward rotation ROM for R scapula due to spasm/ fascial tightness that is limiting proprioception and recruitment for effective downward scapular rotation strengthening. (from previous session)   Strength:  Muscle fatigue reached earlier on R>L  with tall-kneeling/balance exercises. Imbalance surrounding R scapula not allowing Pt. To perform I's, Y's, T's W's well. (from prior session)  MMT LE  - L DF 4+/5 (different rater may account for difference but could be due to spasms in back since test performed prior to DN/manual) - R DF 5/5 (from prior session)  Strength/MMT:  LE MMT (12/26/20) LE MMT Left Right  Hip flex:  (L2) /5 /5  Hip ext: 5/5 5/5  Hip abd: 4+/5 5/5  Hip add: 4+/5 5/5  Hip IR 4/5 4+/5  Hip ER 5/5 5/5  *audible "pop" with mild discomfort noted with resisted L adduction.  (02-06-21) LE MMT Left Right  Hip flex:  (L2) /5 /5  Hip ext: -5/5 5/5  Hip abd: 5/5 5/5  Hip add: 4+/5 4/5!  Hip IR 4+/5 4+/5  Hip ER 4++/5 4++/5    Deep-core strength/endurance sufficient to allow Pt. To perform toe-taps x5 in a row before rest needed.  As of 05/01/21: Pt. Demonstrated weakness in DF in fatiguing during BLE plantarflexion at 23 repetitions.  Pelvic floor: TTP through R posterior PR/PC and OI as well as 5 o'clock region of posterior fourchette. TTP to to L posterior fourchette at ~ 5 o'clock. Coordinated and able to keep probe in even at ~ 45 degrees. Increased force production when TA and obliques recruited. ~ 3+/5 strength and maximal endurance at ~ 8 seconds.  (from prior session)  1/7:  Strength ~ 2+/5 initially with TTP to anterior and posterior PR/PC as well as decreased fascial mobility along anterior/inferior aspect of bladder.  **following treatment Pt. Able to achieve 3++/5 with decreased recruitment of the posterior PR/PC and IC near coccyx remaining. (from previous session)  01/09/21: TTP to R>L coccygeus, OI, and IC near coccyx. Strength ~ 3/5 and 3 sec. endurance with poor recruitment of posterior PFM.  **following treatment, strength ~ 3++/5 with much more squeeze from posterior PFM and ~ 8 sec. Hold.  04/03/21: TTP to B STP and bulbo as well as EUS and decreased mobility through 2nd layer fascia on  R>L.  04/17/20: TTP to all except coccygeus on L and to R anterior and posterior PR/PC. 2/5 at BOS, Pt. demonstrated 3+/ 5 strength at EOS.  Palpation:  Greatest tenderness with pressure on spinous processes T5 and L2. (from previous session)  02/06/21: TTP to R TFL, iliopsoas, vastus lateralis and rectus femoris  03/06/21: TTP to R Iliacus, Psoas, TFL, and vastus lateralis.  03/20/21:  TTP to R iliacus L QL, adductor brevis and pectineus  04/17/21: TTP to B diaphragm.  TODAY: TTP to L QL, R iliacus , TFL and lateral quads  Gait Analysis: (from prior session) Gait speed: 1.19ms ; anterior pelvic tilt, knee hyperextension, heel strike - with cues pelvic neutral achieved, with appropriate arm swing and decreased heel strike.     INTERVENTIONS THIS SESSION:   Manual: Performed TP release to L QL, R iliacus , TFL and lateral quads followed by MET correction for R anterior rotation and MWM to correct R anterior rotation in side-lying x2 each to decrease spasm and pain and allow for improved balance of musculature for improved function and decreased symptoms.   Total time: 60 min.  PT Short Term Goals - 05/01/21 1313       PT SHORT TERM GOAL #1   Title Patient will demonstrate improved pelvic alignment and balance of musculature surrounding the pelvis to facilitate decreased PFM spasms and decrease pressure on nerve roots to allow for optimal PFM function    Baseline Has L foot-drop and pain in L glutes/knee as well as inability to sense PFM and L posterior innominate rotation/spasms surrounding pelvis.    Time 5    Period Weeks    Status Achieved    Target Date 11/26/19      PT SHORT TERM GOAL #2   Title Patient will demonstrate HEP x1 in the clinic to demonstrate understanding and proper form to allow for further improvement.    Baseline Pt. lacks knowledge of therapeutic ecersise that will decrease Sx.    Time 5    Period  Weeks    Status Achieved    Target Date 11/26/19      PT SHORT TERM GOAL #3   Title Pt. will be able to walk 250 Ft.with CGA and w/o assistive device to demonstrate improved balance/decreased instability and foot drop.    Baseline Pt. using RW to AMB due to L foot-drop and increased instability.    Time 5    Period Weeks    Status Achieved    Target Date 08/20/19      PT SHORT TERM GOAL #4   Title Patient will demonstrate a coordinated contraction, relaxation, and bulge of the pelvic floor muscles to demonstrate functional recruitment and motion and allow for further strengthening.    Baseline Pt. reports inability to sense the PFM for kegel or relaxation, having mixed UI    Time 5    Period Weeks    Status Achieved    Target Date 08/20/19      PT SHORT TERM GOAL #5   Title Pt. will be able to demonstrate tall/half kneeling exercises for 10-15 seconds without increased pelvic pressure/UI sensation.    Baseline Pt. has avoided half-kneeling exercises because they increased Sx.    Time 6    Period Weeks    Status New    Target Date 06/12/21               PT Long Term Goals - 05/01/21 1313       PT LONG TERM GOAL #1   Title Patient will report no episodes of UUI/SUI over the course of the prior two weeks to demonstrate improved functional ability.    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 As of 4/30: She had been doing better but then took a step backward when she was sick and vomiting a lot. As of 7/9: Pt. continues to have decreased incontinence but had an episode of total bladder loss and UUI ~ 1-2 times/day still (worsened due to overuse/spasm of LB musculature. As of 11/12: She had one "big accident" and one "snexe-pee" accident over the week. As of 05/01/21: Pt. has had an increase in Sx. since becoming pregnant and has been fit for a pessary which she will begin using soon to  prevent further UI worsening over the course of her pregnancy along with further PFM strengthening.    Time 10    Period Weeks    Status On-going    Target Date 07/10/21      PT LONG TERM  GOAL #2   Title Patient will report no pain with intercourse to demonstrate improved functional ability.    Baseline having mild pain with initial penetration As of 1:29: had some pain at initial penetration and with deeper thrusting at ~ 3-4/10 but "not terrible" 10/17/20: has not attempted to know As of 05/01/21: Pt. had some radiating pain with pessary fitting even following release the prior Friday, reports "not as bad as it could've been".    Time 10    Period Weeks    Status On-going    Target Date 07/10/21      PT LONG TERM GOAL #3   Title Patient will score at or below 22/100 on the UDI and 27/100 on the UIQ to demonstrate a clinically meaningful decrease in disability and distress due to pelvic floor dysfunction.    Baseline UDI: 67, UIQ: 72/100 10-19-19: UDI 55.5, UIQ 76/100; POPDI 11/100 As of 1/29: UDI:33/100, UIQ: 6/100, POPDI:6/100. As of 4/30: Due to set-back from vomiting. As of 11/12: UDI: 17/100, UIQ: POPDI: 11/100    Time 10    Period Weeks    Status Achieved    Target Date 08/22/20      PT LONG TERM GOAL #4   Title Pt. 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    Baseline Pt. requiring RW for AMB due to L foot-drop and imbalance. As of 10/12: Pt. requiring some support (e.g. grocery cart) for > 20 min. of AMB or stairs.    Time 10    Period Weeks    Status Achieved    Target Date 11/25/20      PT LONG TERM GOAL #5   Title Pt. will improve in FOTO score by 10 points to demonstrate improved function.    Baseline FOTO PFDI Urinary: 29, Urinary Problem:54    Time 10    Period Weeks    Status On-going    Target Date 07/10/21      PT LONG TERM GOAL #6   Title Patient will  demonstrate 4+/5 strength or better in the PF and surrounding musculature to show improved functional strength for childcare and other vocational and recreational activities.    Baseline PFM 3/5, As of 04/17/21: 3+/5.    Time 10    Period Weeks    Status On-going    Target Date 07/10/21                    Patient will benefit from skilled therapeutic intervention in order to improve the following deficits and impairments:     Visit Diagnosis: No diagnosis found.     Problem List Patient Active Problem List   Diagnosis Date Noted   History of vacuum extraction assisted delivery 04/03/2021   H/O macrosomia in infant in prior pregnancy, currently pregnant 04/03/2021   AMA (advanced maternal age) multigravida 24+, first trimester 04/03/2021   Hypermobility arthralgia 02/25/2021   Positive urine pregnancy test 02/25/2021   Chronic pain of right knee 09/17/2020   Urinary incontinence 08/17/2019   History of left foot drop 08/17/2019   Willa Rough DPT, ATC Willa Rough 05/22/2021, 8:36 AM  [place] MAIN [facility] SERVICES [address] [place], [place], [address] Phone: [phone]   Fax:  [phone]  Name: Lisa Brady MRN: [id number] Date of Birth: 06-27-1986

## 2021-05-27 ENCOUNTER — Ambulatory Visit
Admission: RE | Admit: 2021-05-27 | Discharge: 2021-05-27 | Disposition: A | Discharge status: Home / Self Care | Payer: No Typology Code available for payment source | Source: Ambulatory Visit | Attending: Certified Nurse Midwife | Admitting: Certified Nurse Midwife

## 2021-05-27 ENCOUNTER — Other Ambulatory Visit: Payer: Self-pay

## 2021-05-27 DIAGNOSIS — Z3689 Encounter for other specified antenatal screening: Secondary | ICD-10-CM | POA: Insufficient documentation

## 2021-05-27 DIAGNOSIS — O09521 Supervision of elderly multigravida, first trimester: Secondary | ICD-10-CM | POA: Insufficient documentation

## 2021-05-27 DIAGNOSIS — Z3480 Encounter for supervision of other normal pregnancy, unspecified trimester: Secondary | ICD-10-CM | POA: Insufficient documentation

## 2021-05-29 ENCOUNTER — Encounter: Payer: Self-pay | Admitting: Certified Nurse Midwife

## 2021-05-29 DIAGNOSIS — IMO0002 Reserved for concepts with insufficient information to code with codable children: Secondary | ICD-10-CM

## 2021-05-29 DIAGNOSIS — O350XX0 Maternal care for (suspected) central nervous system malformation in fetus, not applicable or unspecified: Secondary | ICD-10-CM | POA: Insufficient documentation

## 2021-05-29 HISTORY — DX: Reserved for concepts with insufficient information to code with codable children: IMO0002

## 2021-06-12 ENCOUNTER — Ambulatory Visit: Payer: No Typology Code available for payment source | Attending: Obstetrics and Gynecology

## 2021-06-12 ENCOUNTER — Encounter: Payer: Self-pay | Admitting: Certified Nurse Midwife

## 2021-06-12 ENCOUNTER — Ambulatory Visit (INDEPENDENT_AMBULATORY_CARE_PROVIDER_SITE_OTHER): Payer: No Typology Code available for payment source | Admitting: Certified Nurse Midwife

## 2021-06-12 ENCOUNTER — Other Ambulatory Visit: Payer: Self-pay

## 2021-06-12 VITALS — BP 108/71 | HR 88 | Wt 130.0 lb

## 2021-06-12 DIAGNOSIS — O09299 Supervision of pregnancy with other poor reproductive or obstetric history, unspecified trimester: Secondary | ICD-10-CM

## 2021-06-12 DIAGNOSIS — Z3482 Encounter for supervision of other normal pregnancy, second trimester: Secondary | ICD-10-CM

## 2021-06-12 DIAGNOSIS — M62838 Other muscle spasm: Secondary | ICD-10-CM | POA: Insufficient documentation

## 2021-06-12 DIAGNOSIS — M533 Sacrococcygeal disorders, not elsewhere classified: Secondary | ICD-10-CM | POA: Insufficient documentation

## 2021-06-12 DIAGNOSIS — R32 Unspecified urinary incontinence: Secondary | ICD-10-CM

## 2021-06-12 DIAGNOSIS — M21372 Foot drop, left foot: Secondary | ICD-10-CM | POA: Diagnosis present

## 2021-06-12 DIAGNOSIS — O09521 Supervision of elderly multigravida, first trimester: Secondary | ICD-10-CM

## 2021-06-12 DIAGNOSIS — Z3A22 22 weeks gestation of pregnancy: Secondary | ICD-10-CM

## 2021-06-12 LAB — POCT URINALYSIS DIPSTICK OB
Blood, UA: NEGATIVE
Glucose, UA: NEGATIVE
Leukocytes, UA: NEGATIVE
POC,PROTEIN,UA: NEGATIVE
Spec Grav, UA: 1.01 (ref 1.010–1.025)
pH, UA: 7.5 (ref 5.0–8.0)

## 2021-06-12 NOTE — Progress Notes (Signed)
ROB-Reports decreased symptoms of incontinence at this time. Update given regarding pessary status. Continues weekly PT appointments. Discussed ultrasound findings and recommendations regarding choroid plexus cyst, declined repeat ultrasound in the setting of low risk NIPT. Anticipatory guidance regarding course of prenatal care. Reviewed red flag symptoms and when to call. RTC x 4 weeks for ROB with Dr. Marcelline Mates or sooner if needed.

## 2021-06-12 NOTE — Therapy (Signed)
[place] MAIN [facility] SERVICES [address] [place], [place], [address] Phone: [phone]   Fax:  [phone]  Physical Therapy Treatment  The patient has been informed of current processes in place at Outpatient Rehab to protect patients from Covid-19 exposure including social distancing, schedule modifications, and new cleaning procedures. After discussing their particular risk with a therapist based on the patient's personal risk factors, the patient has decided to proceed with in-person therapy.  Patient Details  Name: Lisa Brady MRN: [id number] Date of Birth: May 15, 1986 No data recorded  Encounter Date: 06/12/2021   PT End of Session - 06/12/21 0833     Visit Number 73    Number of Visits 6    Date for PT Re-Evaluation 07/10/21    Authorization Type [place]    Authorization - Visit Number 3    Authorization - Number of Visits 10    Progress Note Due on Visit 60    PT Start Time 0730    PT Stop Time 0830    PT Time Calculation (min) 60 min    Activity Tolerance Patient tolerated treatment well;No increased pain    Behavior During Therapy [facility] for tasks assessed/performed             Past Medical History:  Diagnosis Date   Dysplastic nevus 04/01/2021   right medial forearm, mod Atypia    Dysplastic nevus 04/01/2021   right inferior axilla, mild atypia    Dysplastic nevus 04/01/2021   right superior medial scapula, mild atypia    Foot drop    Hypermobility arthralgia    Maternal varicella, non-immune 11/20/2018   Scoliosis    Urinary incontinence     Past Surgical History:  Procedure Laterality Date   WISDOM TOOTH EXTRACTION  2006    There were no vitals filed for this visit.  Pelvic Floor Physical Therapy Treatment Note  SCREENING  Changes in medications, allergies, or medical history?: pregnant    SUBJECTIVE  Patient reports: She feels like she has reached the point where the baby is big enough  that the uterus is sitting higher in the abdomen so she has not been having a lot of symptoms. She was on vacation and has not done a lot of her HEP but overall doing well. She had swelling while on the plane with her compression socks that was so uncomfortable she had to take the socks off.   Precautions:  She is [redacted] weeks pregnant,  Hypermobility and Scoliosis  Pain update:  Location of pain:  Low back hip/side (neck) Current pain:  0/10 (0/10 with turning) Max pain:  2/10  (0/10) Least pain:  0/10 (0/10) Nature of pain: tightness/achy  **no increased pain following treatment   Patient Goals: Control her bladder/not need depends.   OBJECTIVE  Changes in:   Observation:  Slight R anterior innominate rotation (from previous session)  R scapula protracted and winging>L (from prior session)  12/26/20: alignment not assessed before MMT performed, audble "pop" when performing MMT for L adduction and then pelvis assessed and appropriately aligned in supine.  01/09/21: Pt. Demonstrates R anterior rotation, L up-slip and LLD with LLE long was finally detected. **PSIS appear level in standing with 2/3 heel-lift in R shoe.   03/06/21: mild R anterior rotation, resolved following treatment.  03/20/21: L up-slip and R anterior/L posterior rotation, possible R out-flare.  **following treatment pubic symphysis level, up-slip corrected but R anterion rotation remaining.   04/17/21: R out-flare.  05/08/21: TTP to B IC and coccygeus. Sytrnge ~ 2/5 in standing and 2+/5 in supine at BOS. ~ 3+ following treatment in supine, Pt. Able to sense in standing following treatment.   05/22/21: ASIS and PSIS appeared level in standing but in supine R ASIS low and ankles level (RLE is short-leg). At EOS ASIS level and RLE short (appropriate) in supine.  TODAY: ASIS and PSIS appear level in standing.  ROM/Mobility:  Decreased scapular depression and downward rotation ROM in R>L scapula. Decreased L cervical  rotation with painful end-feel. Decreased mobility at pubic symphysis and pain with pressure at L Pubic ramus  (from prior session)  Pt demonstrates decreased upward rotation ROM for R scapula due to spasm/ fascial tightness that is limiting proprioception and recruitment for effective downward scapular rotation strengthening. (from previous session)   Strength:  Muscle fatigue reached earlier on R>L with tall-kneeling/balance exercises. Imbalance surrounding R scapula not allowing Pt. To perform I's, Y's, T's W's well. (from prior session)  MMT LE  - L DF 4+/5 (different rater may account for difference but could be due to spasms in back since test performed prior to DN/manual) - R DF 5/5 (from prior session)  Strength/MMT:  LE MMT (12/26/20) LE MMT Left Right  Hip flex:  (L2) /5 /5  Hip ext: 5/5 5/5  Hip abd: 4+/5 5/5  Hip add: 4+/5 5/5  Hip IR 4/5 4+/5  Hip ER 5/5 5/5  *audible "pop" with mild discomfort noted with resisted L adduction.  (02-06-21) LE MMT Left Right  Hip flex:  (L2) /5 /5  Hip ext: -5/5 5/5  Hip abd: 5/5 5/5  Hip add: 4+/5 4/5!  Hip IR 4+/5 4+/5  Hip ER 4++/5 4++/5    06/12/21 LE MMT Left Right  Hip flex:  (L2) /5 /5  Hip ext: 5/5 5/5  Hip abd: 5/5 5/5  Hip add: 4+/5 4+/5  Hip IR 4+/5 4+/5  Hip ER 5-/5 5-/5    Deep-core strength/endurance sufficient to allow Pt. To perform toe-taps x5 in a row before rest needed.  As of 05/01/21: Pt. Demonstrated weakness in DF in fatiguing during BLE plantarflexion at 23 repetitions.  Pelvic floor: TTP through R posterior PR/PC and OI as well as 5 o'clock region of posterior fourchette. TTP to to L posterior fourchette at ~ 5 o'clock. Coordinated and able to keep probe in even at ~ 45 degrees. Increased force production when TA and obliques recruited. ~ 3+/5 strength and maximal endurance at ~ 8 seconds.  (from prior session)  1/7:  Strength ~ 2+/5 initially with TTP to anterior and posterior PR/PC as well as decreased  fascial mobility along anterior/inferior aspect of bladder.  **following treatment Pt. Able to achieve 3++/5 with decreased recruitment of the posterior PR/PC and IC near coccyx remaining. (from previous session)  01/09/21: TTP to R>L coccygeus, OI, and IC near coccyx. Strength ~ 3/5 and 3 sec. endurance with poor recruitment of posterior PFM.  **following treatment, strength ~ 3++/5 with much more squeeze from posterior PFM and ~ 8 sec. Hold.  04/03/21: TTP to B STP and bulbo as well as EUS and decreased mobility through 2nd layer fascia on R>L.  04/17/20: TTP to all except coccygeus on L and to R anterior and posterior PR/PC. 2/5 at BOS, Pt. demonstrated 3+/ 5 strength at EOS.  Palpation:  Greatest tenderness with pressure on spinous processes T5 and L2. (from previous session)  02/06/21: TTP to R TFL, iliopsoas, vastus lateralis and  rectus femoris  03/06/21: TTP to R Iliacus, Psoas, TFL, and vastus lateralis.  03/20/21:  TTP to R iliacus L QL, adductor brevis and pectineus  04/17/21: TTP to B diaphragm.  05/22/21: TTP to L QL, R iliacus , TFL and lateral quads  TODAY: TTP to B adductor magnus/longus  Gait Analysis: (from prior session) Gait speed: 1.31ms ; anterior pelvic tilt, knee hyperextension, heel strike - with cues pelvic neutral achieved, with appropriate arm swing and decreased heel strike.     INTERVENTIONS THIS SESSION:   Manual: Re-assessed hip strength and performed TP release to B adductor magnus/longus to decrease spasm and pain and allow for improved balance of musculature for improved function and decreased symptoms.  Self-care: educated on ankle pumps, arch supports, belly-band and wrapping feet before bed as ways to manage swelling during pregnancy and decrease pain.  Therex: reviewed multi-stepping in cardinal planes with the most difficulty with posterior step requiring MOD VC, TC and visual biofeedback from mirror for correct performance.   Total time: 60  min.                              PT Short Term Goals - 05/01/21 1313       PT SHORT TERM GOAL #1   Title Patient will demonstrate improved pelvic alignment and balance of musculature surrounding the pelvis to facilitate decreased PFM spasms and decrease pressure on nerve roots to allow for optimal PFM function    Baseline Has L foot-drop and pain in L glutes/knee as well as inability to sense PFM and L posterior innominate rotation/spasms surrounding pelvis.    Time 5    Period Weeks    Status Achieved    Target Date 11/26/19      PT SHORT TERM GOAL #2   Title Patient will demonstrate HEP x1 in the clinic to demonstrate understanding and proper form to allow for further improvement.    Baseline Pt. lacks knowledge of therapeutic ecersise that will decrease Sx.    Time 5    Period Weeks    Status Achieved    Target Date 11/26/19      PT SHORT TERM GOAL #3   Title Pt. will be able to walk 250 Ft.with CGA and w/o assistive device to demonstrate improved balance/decreased instability and foot drop.    Baseline Pt. using RW to AMB due to L foot-drop and increased instability.    Time 5    Period Weeks    Status Achieved    Target Date 08/20/19      PT SHORT TERM GOAL #4   Title Patient will demonstrate a coordinated contraction, relaxation, and bulge of the pelvic floor muscles to demonstrate functional recruitment and motion and allow for further strengthening.    Baseline Pt. reports inability to sense the PFM for kegel or relaxation, having mixed UI    Time 5    Period Weeks    Status Achieved    Target Date 08/20/19      PT SHORT TERM GOAL #5   Title Pt. will be able to demonstrate tall/half kneeling exercises for 10-15 seconds without increased pelvic pressure/UI sensation.    Baseline Pt. has avoided half-kneeling exercises because they increased Sx.    Time 6    Period Weeks    Status New    Target Date 06/12/21               PT  Long  Term Goals - 05/01/21 1313       PT LONG TERM GOAL #1   Title Patient will report no episodes of UUI/SUI over the course of the prior two weeks to demonstrate improved functional ability.    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 As of 4/30: She had been doing better but then took a step backward when she was sick and vomiting a lot. As of 7/9: Pt. continues to have decreased incontinence but had an episode of total bladder loss and UUI ~ 1-2 times/day still (worsened due to overuse/spasm of LB musculature. As of 11/12: She had one "big accident" and one "snexe-pee" accident over the week. As of 05/01/21: Pt. has had an increase in Sx. since becoming pregnant and has been fit for a pessary which she will begin using soon to prevent further UI worsening over the course of her pregnancy along with further PFM strengthening.    Time 10    Period Weeks    Status On-going    Target Date 07/10/21      PT LONG TERM GOAL #2   Title Patient will report no pain with intercourse to demonstrate improved functional ability.    Baseline having mild pain with initial penetration As of 1:29: had some pain at initial penetration and with deeper thrusting at ~ 3-4/10 but "not terrible" 10/17/20: has not attempted to know As of 05/01/21: Pt. had some radiating pain with pessary fitting even following release the prior Friday, reports "not as bad as it could've been".    Time 10    Period Weeks    Status On-going    Target Date 07/10/21      PT LONG TERM GOAL #3   Title Patient will score at or below 22/100 on the UDI and 27/100 on the UIQ to demonstrate a clinically meaningful decrease in disability and distress due to pelvic floor dysfunction.    Baseline UDI: 67, UIQ: 72/100 10-19-19: UDI 55.5, UIQ 76/100; POPDI 11/100 As of 1/29: UDI:33/100, UIQ: 6/100, POPDI:6/100. As of 4/30: Due to set-back from vomiting. As of  11/12: UDI: 17/100, UIQ: POPDI: 11/100    Time 10    Period Weeks    Status Achieved    Target Date 08/22/20      PT LONG TERM GOAL #4   Title Pt. 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    Baseline Pt. requiring RW for AMB due to L foot-drop and imbalance. As of 10/12: Pt. requiring some support (e.g. grocery cart) for > 20 min. of AMB or stairs.    Time 10    Period Weeks    Status Achieved    Target Date 11/25/20      PT LONG TERM GOAL #5   Title Pt. will improve in FOTO score by 10 points to demonstrate improved function.    Baseline FOTO PFDI Urinary: 29, Urinary Problem:54    Time 10    Period Weeks    Status On-going    Target Date 07/10/21      PT LONG TERM GOAL #6   Title Patient will demonstrate 4+/5 strength or better in the PF and surrounding musculature to show improved functional strength for childcare and other vocational and recreational activities.  Baseline PFM 3/5, As of 04/17/21: 3+/5.    Time 10    Period Weeks    Status On-going    Target Date 07/10/21                   Plan - 06/12/21 4656     Clinical Impression Statement Pt. Responded well to all interventions today, demonstrating improved understanding of ways she can self-manage her swelling and help prevent foot and ankle pain, decreased spasm and TTP, as well as understanding and correct performance of all education and exercises provided today. They will continue to benefit from skilled physical therapy to work toward remaining goals and maximize function as well as decrease likelihood of symptom increase or recurrence.     PT Next Visit Plan discuss plan for strengthening and walking program, stabilize gait pattern, discuss benefits of aquatic exercise. Review toe-taps, re-assess PFM, TPDN to R posterior scapular stabilizers and spinal mobilization/ rotational mobs into R rotation  and perform PNF for the scapula and rainbows on the Re-assess rib mobility and obliques/diaphragm, review deep-core coordination (tall kneel, knee step through, stepping, lean-backs); re-assess for Piriformis spasm (have performed DN and TP release now); check in with long term HEP performance    PT Home Exercise Plan day: Teapot, Side-planks, Planks, rows, scap retractions, (blue band). Mini-Marches with leg lift,  standing hip ABD and EXT, modified bridge, tall kneeling. lean-backs, calf raises.     B day: Child's pose V-sit/ single leg hamstring stretch,. Side stretch, Piriformis stretch, Door stretch,  stretch levator scap (neck) on R    Every day: Kegels 3-5x/day with long-hold and pelvic tilt with sneeze, laugh/cough kegel, vaginal weights, mult-direction stepping.        Corrective: Upper cervical rotation Tennis ball release added MET for R anterior for 1 week. Heel-lift for RLE. self external PFM release.    Consulted and Agree with Plan of Care Patient             Patient will benefit from skilled therapeutic intervention in order to improve the following deficits and impairments:     Visit Diagnosis: Other muscle spasm  Foot drop, left  Sacrococcygeal disorders, not elsewhere classified     Problem List Patient Active Problem List   Diagnosis Date Noted   Choroid plexus cyst of fetus on prenatal ultrasound 05/29/2021   History of vacuum extraction assisted delivery 04/03/2021   H/O macrosomia in infant in prior pregnancy, currently pregnant 04/03/2021   AMA (advanced maternal age) multigravida 26+, first trimester 04/03/2021   Hypermobility arthralgia 02/25/2021   Positive urine pregnancy test 02/25/2021   Chronic pain of right knee 09/17/2020   Urinary incontinence 08/17/2019   History of left foot drop 08/17/2019   Willa Rough DPT, ATC Willa Rough 06/12/2021, 8:35 AM  [place] MAIN [facility] SERVICES [address]  [place], [place], [address] Phone: [phone]   Fax:  [phone]  Name: Lisa Brady MRN: [id number] Date of Birth: January 10, 1986

## 2021-06-12 NOTE — Progress Notes (Signed)
ROB: She hasn't drank enough water this morning, feeling lightheaded and nauseous, otherwise no concerns today.

## 2021-06-12 NOTE — Patient Instructions (Signed)
Second Trimester of Pregnancy  The second trimester of pregnancy is from week 13 through week 27. This is also called months 4 through 6 of pregnancy. This is often the time when you feelyour best. During the second trimester: Morning sickness is less or has stopped. You may have more energy. You may feel hungry more often. At this time, your unborn baby (fetus) is growing very fast. At the end of the sixth month, the unborn baby may be up to 12 inches long and weigh about 1 pounds. You will likely start to feelthe baby move between 16 and 20 weeks of pregnancy. Body changes during your second trimester Your body continues to go through many changes during this time. The changesvary and generally return to normal after the baby is born. Physical changes You will gain more weight. You may start to get stretch marks on your hips, belly (abdomen), and breasts. Your breasts will grow and may hurt. Dark spots or blotches may develop on your face. A dark line from your belly button to the pubic area (linea nigra) may appear. You may have changes in your hair. Health changes You may have headaches. You may have heartburn. You may have trouble pooping (constipation). You may have hemorrhoids or swollen, bulging veins (varicose veins). Your gums may bleed. You may pee (urinate) more often. You may have back pain. Follow these instructions at home: Medicines Take over-the-counter and prescription medicines only as told by your doctor. Some medicines are not safe during pregnancy. Take a prenatal vitamin that contains at least 600 micrograms (mcg) of folic acid. Eating and drinking Eat healthy meals that include: Fresh fruits and vegetables. Whole grains. Good sources of protein, such as meat, eggs, or tofu. Low-fat dairy products. Avoid raw meat and unpasteurized juice, milk, and cheese. You may need to take these actions to prevent or treat trouble pooping: Drink enough fluids to keep  your pee (urine) pale yellow. Eat foods that are high in fiber. These include beans, whole grains, and fresh fruits and vegetables. Limit foods that are high in fat and sugar. These include fried or sweet foods. Activity Exercise only as told by your doctor. Most people can do their usual exercise during pregnancy. Try to exercise for 30 minutes at least 5 days a week. Stop exercising if you have pain or cramps in your belly or lower back. Do not exercise if it is too hot or too humid, or if you are in a place of great height (high altitude). Avoid heavy lifting. If you choose to, you may have sex unless your doctor tells you not to. Relieving pain and discomfort Wear a good support bra if your breasts are sore. Take warm water baths (sitz baths) to soothe pain or discomfort caused by hemorrhoids. Use hemorrhoid cream if your doctor approves. Rest with your legs raised (elevated) if you have leg cramps or low back pain. If you develop bulging veins in your legs: Wear support hose as told by your doctor. Raise your feet for 15 minutes, 3-4 times a day. Limit salt in your food. Safety Wear your seat belt at all times when you are in a car. Talk with your doctor if someone is hurting you or yelling at you a lot. Lifestyle Do not use hot tubs, steam rooms, or saunas. Do not douche. Do not use tampons or scented sanitary pads. Avoid cat litter boxes and soil used by cats. These carry germs that can harm your baby and can cause   a loss of your baby by miscarriage or stillbirth. Do not use herbal medicines, illegal drugs, or medicines that are not approved by your doctor. Do not drink alcohol. Do not smoke or use any products that contain nicotine or tobacco. If you need help quitting, ask your doctor. General instructions Keep all follow-up visits. This is important. Ask your doctor about local prenatal classes. Ask your doctor about the right foods to eat or for help finding a  counselor. Where to find more information American Pregnancy Association: americanpregnancy.org [employer]: www.acog.org [employer] on [employer]: KeywordPortfolios.com.br Contact a doctor if: You have a headache that does not go away when you take medicine. You have changes in how you see, or you see spots in front of your eyes. You have mild cramps, pressure, or pain in your lower belly. You continue to feel like you may vomit (nauseous), you vomit, or you have watery poop (diarrhea). You have bad-smelling fluid coming from your vagina. You have pain when you pee or your pee smells bad. You have very bad swelling of your face, hands, ankles, feet, or legs. You have a fever. Get help right away if: You are leaking fluid from your vagina. You have spotting or bleeding from your vagina. You have very bad belly cramping or pain. You have trouble breathing. You have chest pain. You faint. You have not felt your baby move for the time period told by your doctor. You have new or increased pain, swelling, or redness in an arm or leg. Summary The second trimester of pregnancy is from week 13 through week 27 (months 4 through 6). Eat healthy meals. Exercise as told by your doctor. Most people can do their usual exercise during pregnancy. Do not use herbal medicines, illegal drugs, or medicines that are not approved by your doctor. Do not drink alcohol. Call your doctor if you get sick or if you notice anything unusual about your pregnancy. This information is not intended to replace advice given to you by your health care provider. Make sure you discuss any questions you have with your healthcare provider. Document Revised: 04/30/2020 Document Reviewed: 03/06/2020 Elsevier Patient Education  2022 [employer].

## 2021-06-19 ENCOUNTER — Telehealth: Payer: Self-pay

## 2021-06-19 NOTE — Telephone Encounter (Signed)
-----   Message from Arvil Persons sent at 06/15/2021 10:06 AM EDT ----- Lisa Brady this pt came in last week- she had questions about her past charges. She was confirmed pregnant at her PCP. She also was asking if she could use her FSA and pay at the time of delivery. Please Advise.

## 2021-06-19 NOTE — Telephone Encounter (Signed)
Mailbox is full. Will send my chart message.

## 2021-06-26 ENCOUNTER — Telehealth: Payer: Self-pay

## 2021-06-26 ENCOUNTER — Ambulatory Visit: Payer: No Typology Code available for payment source

## 2021-06-26 ENCOUNTER — Other Ambulatory Visit: Payer: Self-pay

## 2021-06-26 DIAGNOSIS — M533 Sacrococcygeal disorders, not elsewhere classified: Secondary | ICD-10-CM

## 2021-06-26 DIAGNOSIS — M62838 Other muscle spasm: Secondary | ICD-10-CM | POA: Diagnosis not present

## 2021-06-26 DIAGNOSIS — M21372 Foot drop, left foot: Secondary | ICD-10-CM

## 2021-06-26 NOTE — Patient Instructions (Addendum)
OR Do 2 sets of 15, trying not to compensate with other motions, only go as far as you can without doing funny things with the rest of your body.     OR Do 2 sets of 15, trying not to compensate with other motions, only go as far as you can without doing funny things with the rest of your body.   Remember to stretch your left QL and try having hubby do a leg-pull like we did today while you hold for 5 seconds and then relax fully. Do 5 repetitions and then take 5 deep belly breaths and just relax everything while he continues to pull.   Do 2x15 to strengthen. Can also be used as a treatment if you have pubic symphysis pain.

## 2021-06-26 NOTE — Therapy (Signed)
[place] MAIN [facility] SERVICES [address] [place], [place], [address] Phone: [phone]   Fax:  [phone]  Physical Therapy Treatment  The patient has been informed of current processes in place at Outpatient Rehab to protect patients from Covid-19 exposure including social distancing, schedule modifications, and new cleaning procedures. After discussing their particular risk with a therapist based on the patient's personal risk factors, the patient has decided to proceed with in-person therapy.  Patient Details  Name: Lisa Brady MRN: [id number] Date of Birth: [birth year] No data recorded  Encounter Date: 06/26/2021   PT End of Session - 06/26/21 0926     Visit Number 4    Number of Visits 87    Date for PT Re-Evaluation 07/10/21    Authorization Type [employer]    Authorization - Visit Number 4    Authorization - Number of Visits 10    Progress Note Due on Visit 42    PT Start Time 0730    PT Stop Time 0845    PT Time Calculation (min) 75 min    Activity Tolerance Patient tolerated treatment well;No increased pain    Behavior During Therapy [facility] for tasks assessed/performed             Past Medical History:  Diagnosis Date   Dysplastic nevus 04/01/2021   right medial forearm, mod Atypia    Dysplastic nevus 04/01/2021   right inferior axilla, mild atypia    Dysplastic nevus 04/01/2021   right superior medial scapula, mild atypia    Foot drop    Hypermobility arthralgia    Maternal varicella, non-immune 11/20/2018   Scoliosis    Urinary incontinence     Past Surgical History:  Procedure Laterality Date   WISDOM TOOTH EXTRACTION  2006    There were no vitals filed for this visit.  Pelvic Floor Physical Therapy Treatment Note  SCREENING  Changes in medications, allergies, or medical history?: pregnant    SUBJECTIVE  Patient reports: She is doing ok but she did have some more SUI and urgency a couple  weeks ago but it has been a little better this week. Her back has been hurting more this week, she has not been keeping up with all of the upper body strengthening as much and has been laying on the floor with her toddler who is having separation anxiety and won't stay in bed. It seems to be a little better after doing some of her upper exercises the last couple days. She is feeling low back and hips some today.   Precautions:  She is [redacted] weeks pregnant,  Hypermobility and Scoliosis  Pain update:  Location of pain:  Low back hip/side (neck) Current pain:  2/10 (0/10 with turning) Max pain:  3/10  (0/10) Least pain:  0/10 (0/10) Nature of pain: tightness/achy  **no increased pain following treatment   Patient Goals: Control her bladder/not need depends.   OBJECTIVE  Changes in:   Observation:  Slight R anterior innominate rotation (from previous session)  R scapula protracted and winging>L (from prior session)  12/26/20: alignment not assessed before MMT performed, audble "pop" when performing MMT for L adduction and then pelvis assessed and appropriately aligned in supine.  01/09/21: Pt. Demonstrates R anterior rotation, L up-slip and LLD with LLE long was finally detected. **PSIS appear level in standing with 2/3 heel-lift in R shoe.   03/06/21: mild R anterior rotation, resolved following treatment.  03/20/21: L up-slip and R  anterior/L posterior rotation, possible R out-flare.  **following treatment pubic symphysis level, up-slip corrected but R anterion rotation remaining.   04/17/21: R out-flare.  05/08/21: TTP to B IC and coccygeus. Sytrnge ~ 2/5 in standing and 2+/5 in supine at BOS. ~ 3+ following treatment in supine, Pt. Able to sense in standing following treatment.   05/22/21: ASIS and PSIS appeared level in standing but in supine R ASIS low and ankles level (RLE is short-leg). At EOS ASIS level and RLE short (appropriate) in supine.  06/12/21: ASIS and PSIS appear level in  standing.  TODAY: R anterior rotation and L up-slip  ROM/Mobility:  Decreased scapular depression and downward rotation ROM in R>L scapula. Decreased L cervical rotation with painful end-feel. Decreased mobility at pubic symphysis and pain with pressure at L Pubic ramus  (from prior session)  Pt demonstrates decreased upward rotation ROM for R scapula due to spasm/ fascial tightness that is limiting proprioception and recruitment for effective downward scapular rotation strengthening. (from previous session)   Strength:  Muscle fatigue reached earlier on R>L with tall-kneeling/balance exercises. Imbalance surrounding R scapula not allowing Pt. To perform I's, Y's, T's W's well. (from prior session)  MMT LE  - L DF 4+/5 (different rater may account for difference but could be due to spasms in back since test performed prior to DN/manual) - R DF 5/5 (from prior session)  Strength/MMT:  LE MMT (12/26/20) LE MMT Left Right  Hip flex:  (L2) /5 /5  Hip ext: 5/5 5/5  Hip abd: 4+/5 5/5  Hip add: 4+/5 5/5  Hip IR 4/5 4+/5  Hip ER 5/5 5/5  *audible "pop" with mild discomfort noted with resisted L adduction.  (02-06-21) LE MMT Left Right  Hip flex:  (L2) /5 /5  Hip ext: -5/5 5/5  Hip abd: 5/5 5/5  Hip add: 4+/5 4/5!  Hip IR 4+/5 4+/5  Hip ER 4++/5 4++/5    06/12/21 LE MMT Left Right  Hip flex:  (L2) /5 /5  Hip ext: 5/5 5/5  Hip abd: 5/5 5/5  Hip add: 4+/5 4+/5  Hip IR 4+/5 4+/5  Hip ER 5-/5 5-/5    Deep-core strength/endurance sufficient to allow Pt. To perform toe-taps x5 in a row before rest needed.  As of 05/01/21: Pt. Demonstrated weakness in DF in fatiguing during BLE plantarflexion at 23 repetitions.  Pelvic floor: TTP through R posterior PR/PC and OI as well as 5 o'clock region of posterior fourchette. TTP to to L posterior fourchette at ~ 5 o'clock. Coordinated and able to keep probe in even at ~ 45 degrees. Increased force production when TA and obliques recruited. ~ 3+/5  strength and maximal endurance at ~ 8 seconds.  (from prior session)  1/7:  Strength ~ 2+/5 initially with TTP to anterior and posterior PR/PC as well as decreased fascial mobility along anterior/inferior aspect of bladder.  **following treatment Pt. Able to achieve 3++/5 with decreased recruitment of the posterior PR/PC and IC near coccyx remaining. (from previous session)  01/09/21: TTP to R>L coccygeus, OI, and IC near coccyx. Strength ~ 3/5 and 3 sec. endurance with poor recruitment of posterior PFM.  **following treatment, strength ~ 3++/5 with much more squeeze from posterior PFM and ~ 8 sec. Hold.  04/03/21: TTP to B STP and bulbo as well as EUS and decreased mobility through 2nd layer fascia on R>L.  04/17/20: TTP to all except coccygeus on L and to R anterior and posterior PR/PC. 2/5 at BOS,  Pt. demonstrated 3+/ 5 strength at EOS.  Palpation:  Greatest tenderness with pressure on spinous processes T5 and L2. (from previous session)  02/06/21: TTP to R TFL, iliopsoas, vastus lateralis and rectus femoris  03/06/21: TTP to R Iliacus, Psoas, TFL, and vastus lateralis.  03/20/21:  TTP to R iliacus L QL, adductor brevis and pectineus  04/17/21: TTP to B diaphragm.  05/22/21: TTP to L QL, R iliacus , TFL and lateral quads  06/12/21: TTP to B adductor magnus/longus  06/26/21: TTP to R OI and Piriformis, B QL.  Gait Analysis: (from prior session) Gait speed: 1.66ms ; anterior pelvic tilt, knee hyperextension, heel strike - with cues pelvic neutral achieved, with appropriate arm swing and decreased heel strike.     INTERVENTIONS THIS SESSION:   Manual: Performed TP release to R OI and Piriformis, and B QL followed by MET correction and L up-slip corrections x2 to decrease spasm and pain, improve pelvic alignment, and allow for improved balance of musculature for improved function and decreased symptoms.  Therex: Educated on hip IR/ER and adductor strengthening to continue to improve  stability around the hip and prevent return of spasm and malalignment.    Total time: 75 min.                                PT Short Term Goals - 05/01/21 1313       PT SHORT TERM GOAL #1   Title Patient will demonstrate improved pelvic alignment and balance of musculature surrounding the pelvis to facilitate decreased PFM spasms and decrease pressure on nerve roots to allow for optimal PFM function    Baseline Has L foot-drop and pain in L glutes/knee as well as inability to sense PFM and L posterior innominate rotation/spasms surrounding pelvis.    Time 5    Period Weeks    Status Achieved    Target Date 11/26/19      PT SHORT TERM GOAL #2   Title Patient will demonstrate HEP x1 in the clinic to demonstrate understanding and proper form to allow for further improvement.    Baseline Pt. lacks knowledge of therapeutic ecersise that will decrease Sx.    Time 5    Period Weeks    Status Achieved    Target Date 11/26/19      PT SHORT TERM GOAL #3   Title Pt. will be able to walk 250 Ft.with CGA and w/o assistive device to demonstrate improved balance/decreased instability and foot drop.    Baseline Pt. using RW to AMB due to L foot-drop and increased instability.    Time 5    Period Weeks    Status Achieved    Target Date 08/20/19      PT SHORT TERM GOAL #4   Title Patient will demonstrate a coordinated contraction, relaxation, and bulge of the pelvic floor muscles to demonstrate functional recruitment and motion and allow for further strengthening.    Baseline Pt. reports inability to sense the PFM for kegel or relaxation, having mixed UI    Time 5    Period Weeks    Status Achieved    Target Date 08/20/19      PT SHORT TERM GOAL #5   Title Pt. will be able to demonstrate tall/half kneeling exercises for 10-15 seconds without increased pelvic pressure/UI sensation.    Baseline Pt. has avoided half-kneeling exercises because they increased Sx.  Time 6    Period Weeks    Status New    Target Date 06/12/21               PT Long Term Goals - 05/01/21 1313       PT LONG TERM GOAL #1   Title Patient will report no episodes of UUI/SUI over the course of the prior two weeks to demonstrate improved functional ability.    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 As of 4/30: She had been doing better but then took a step backward when she was sick and vomiting a lot. As of 7/9: Pt. continues to have decreased incontinence but had an episode of total bladder loss and UUI ~ 1-2 times/day still (worsened due to overuse/spasm of LB musculature. As of 11/12: She had one "big accident" and one "snexe-pee" accident over the week. As of 05/01/21: Pt. has had an increase in Sx. since becoming pregnant and has been fit for a pessary which she will begin using soon to prevent further UI worsening over the course of her pregnancy along with further PFM strengthening.    Time 10    Period Weeks    Status On-going    Target Date 07/10/21      PT LONG TERM GOAL #2   Title Patient will report no pain with intercourse to demonstrate improved functional ability.    Baseline having mild pain with initial penetration As of 1:29: had some pain at initial penetration and with deeper thrusting at ~ 3-4/10 but "not terrible" 10/17/20: has not attempted to know As of 05/01/21: Pt. had some radiating pain with pessary fitting even following release the prior Friday, reports "not as bad as it could've been".    Time 10    Period Weeks    Status On-going    Target Date 07/10/21      PT LONG TERM GOAL #3   Title Patient will score at or below 22/100 on the UDI and 27/100 on the UIQ to demonstrate a clinically meaningful decrease in disability and distress due to pelvic floor dysfunction.    Baseline UDI: 67, UIQ: 72/100 10-19-19: UDI 55.5, UIQ 76/100; POPDI 11/100 As  of 1/29: UDI:33/100, UIQ: 6/100, POPDI:6/100. As of 4/30: Due to set-back from vomiting. As of 11/12: UDI: 17/100, UIQ: POPDI: 11/100    Time 10    Period Weeks    Status Achieved    Target Date 08/22/20      PT LONG TERM GOAL #4   Title Pt. 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    Baseline Pt. requiring RW for AMB due to L foot-drop and imbalance. As of 10/12: Pt. requiring some support (e.g. grocery cart) for > 20 min. of AMB or stairs.    Time 10    Period Weeks    Status Achieved    Target Date 11/25/20      PT LONG TERM GOAL #5   Title Pt. will improve in FOTO score by 10 points to demonstrate improved function.    Baseline FOTO PFDI Urinary: 29, Urinary Problem:54    Time 10    Period Weeks    Status On-going    Target Date 07/10/21      PT LONG TERM GOAL #  6   Title Patient will demonstrate 4+/5 strength or better in the PF and surrounding musculature to show improved functional strength for childcare and other vocational and recreational activities.    Baseline PFM 3/5, As of 04/17/21: 3+/5.    Time 10    Period Weeks    Status On-going    Target Date 07/10/21                   Plan - 06/26/21 0926     Clinical Impression Statement Pt. Responded well to all interventions today, demonstrating improved pelvic alignment by ~ 50%, decreased spasm/TTP, as well as understanding and correct performance of all education and exercises provided today. She will benefit from further hip and spinal stability training and manual treatment to reduce spasms and maintain function through her pregnancy due to her hypermobility and scoliosis. She will continue to benefit from skilled physical therapy to work toward remaining goals and maximize function as well as decrease likelihood of symptom increase or recurrence.     PT Next Visit Plan re-assess pelvic alignment,  Review toe-taps, re-assess PFM (if dyspareunia continues) , TP repease to R posterior scapular stabilizers and spinal mobilization/ rotational mobs into R rotation and perform PNF for the scapula and rainbows (side-lying ABD) on the R review hip IR/ER strengthening.    PT Home Exercise Plan day: Teapot, Side-planks, Planks, rows, scap retractions, (blue band). Mini-Marches with leg lift,  standing hip ABD and EXT, modified bridge, tall kneeling. lean-backs, calf raises.     B day: Child's pose V-sit/ single leg hamstring stretch,. Side stretch, Piriformis stretch, Door stretch,  stretch levator scap (neck) on R    Every day: Kegels 3-5x/day with long-hold and pelvic tilt with sneeze, laugh/cough kegel, vaginal weights, mult-direction stepping.        Corrective: Upper cervical rotation Tennis ball release added MET for R anterior for 1 week. Heel-lift for RLE. self external PFM release. Hip IR/ER    Consulted and Agree with Plan of Care Patient             Patient will benefit from skilled therapeutic intervention in order to improve the following deficits and impairments:     Visit Diagnosis: Other muscle spasm  Foot drop, left  Sacrococcygeal disorders, not elsewhere classified     Problem List Patient Active Problem List   Diagnosis Date Noted   Choroid plexus cyst of fetus on prenatal ultrasound 05/29/2021   History of vacuum extraction assisted delivery 04/03/2021   H/O macrosomia in infant in prior pregnancy, currently pregnant 04/03/2021   AMA (advanced maternal age) multigravida 31+, first trimester 04/03/2021   Hypermobility arthralgia 02/25/2021   Positive urine pregnancy test 02/25/2021   Chronic pain of right knee 09/17/2020   Urinary incontinence 08/17/2019   History of left foot drop 08/17/2019   Willa Rough DPT, ATC Willa Rough 06/26/2021, 9:32 AM  [place] MAIN [facility] SERVICES [address] [place], [place],  [address] Phone: [phone]   Fax:  [phone]  Name: Sebrina Kessner MRN: [id number] Date of Birth: 07-19-1986

## 2021-06-26 NOTE — Telephone Encounter (Signed)
Pt states dos 4-6 should have been a nurse visit. She was charged for a OV.  Reached out to [facility] to see it she could change it. Will f/u with pt. Pt aware it may take a few weeks. She verbalized understanding.  Pessary ordered on 5/25- pt wants to know ETA-  Pt aware they are on back order from the  manufacture. Per JW- pessary was ordered. ETA per Lavone Nian is end of August.   Pt requested her next ob visit to do the GTT as well. Pt aware she will only be 26 weeks and we prefer to do it at 28. She requested the visit to be pushed out. No concerns at this time. Ok to push visit out. Pt aware ASC not in clinic that week. Ok to see Amalia Hailey ( plans to transfer to md side). Appt made for 8/12. Gtt appt made- instructions given.

## 2021-07-02 ENCOUNTER — Other Ambulatory Visit: Payer: Self-pay

## 2021-07-02 ENCOUNTER — Other Ambulatory Visit: Payer: No Typology Code available for payment source

## 2021-07-02 DIAGNOSIS — Z20822 Contact with and (suspected) exposure to covid-19: Secondary | ICD-10-CM

## 2021-07-02 NOTE — Progress Notes (Signed)
Per Allie Bossier, NP, place COVID lab order for pt.  Order placed.

## 2021-07-04 LAB — NOVEL CORONAVIRUS, NAA: SARS-CoV-2, NAA: NOT DETECTED

## 2021-07-04 LAB — SARS-COV-2, NAA 2 DAY TAT

## 2021-07-10 ENCOUNTER — Ambulatory Visit
Payer: No Typology Code available for payment source | Attending: Obstetrics and Gynecology | Admitting: Physical Therapy

## 2021-07-10 ENCOUNTER — Other Ambulatory Visit: Payer: Self-pay

## 2021-07-10 ENCOUNTER — Encounter: Payer: No Typology Code available for payment source | Admitting: Obstetrics and Gynecology

## 2021-07-10 DIAGNOSIS — M21372 Foot drop, left foot: Secondary | ICD-10-CM | POA: Insufficient documentation

## 2021-07-10 DIAGNOSIS — M533 Sacrococcygeal disorders, not elsewhere classified: Secondary | ICD-10-CM | POA: Insufficient documentation

## 2021-07-10 DIAGNOSIS — M62838 Other muscle spasm: Secondary | ICD-10-CM | POA: Insufficient documentation

## 2021-07-10 NOTE — Patient Instructions (Addendum)
  Lengthen Back rib by R  shoulder    Lie on L  side , pillow between knees and under head  Pull  arm overhead over mattress, grab the edge of mattress,pull it upward, drawing elbow away from ears  Breathing 10 reps  Open book (handout)  Lying on  _ side , rotating  __ only this week  Rotating onto pillow /yoga block  Pillow/ Block between knees  10 reps   ___   Lengthened breathing for relaxation to calm after coughing spells, to optimize length of pelvic floor   Start with 2:2 count   For 3 reps   Then 2:4 count  For 3 reps  ___  Squeeze PFM with cough   ___ Sitting with uncrossed thighs 4 points of contact  ___   Proper body mechanics with getting out of a chair to decrease strain  on back &pelvic floor   Avoid holding your breath when Getting out of the chair:  Scoot to front part of chair chair Heels behind knees, feet are hip width apart, nose over toes  Inhale like you are smelling roses Exhale to stand   __ Rolling in bed with 4 points of contact, elbows dig in with heels, scoot hips and move knees together as you turn

## 2021-07-10 NOTE — Therapy (Signed)
[place] MAIN [facility] SERVICES [address] [place], [place], [address] Phone: [phone]   Fax:  [phone]  Physical Therapy Treatment  Patient Details  Name: Lisa Brady MRN: [id number] Date of Birth: 1986/10/16 No data recorded  Encounter Date: 07/10/2021   PT End of Session - 07/10/21 0854     Visit Number 6    Number of Visits 72    Date for PT Re-Evaluation 14/48/18   recert 04/10/30   Authorization Type [place]    Authorization - Visit Number 1    Authorization - Number of Visits 10    PT Start Time 0804    PT Stop Time 0900    PT Time Calculation (min) 56 min    Activity Tolerance Patient tolerated treatment well;No increased pain    Behavior During Therapy [facility] for tasks assessed/performed             Past Medical History:  Diagnosis Date   Dysplastic nevus 04/01/2021   right medial forearm, mod Atypia    Dysplastic nevus 04/01/2021   right inferior axilla, mild atypia    Dysplastic nevus 04/01/2021   right superior medial scapula, mild atypia    Foot drop    Hypermobility arthralgia    Maternal varicella, non-immune 11/20/2018   Scoliosis    Urinary incontinence     Past Surgical History:  Procedure Laterality Date   WISDOM TOOTH EXTRACTION  2006    There were no vitals filed for this visit.   Subjective Assessment - 07/10/21 0812     Subjective Pt reported she has had a cough and noticed more leakage. Pt is [redacted] weeks pregnant. Pt will have an appt to get fitted for pessary for support during pregnancy.                [facility] PT Assessment - 07/10/21 0830       Observation/Other Assessments   Observations R thigh crossed over L    Scoliosis R shoulder slightly higher, B shoulder IR R >L ,, pelvis levelled, R shoe lift in shoe      Coordination   Coordination and Movement Description chest breathing, upper trap overuse,. limited diaphragmatic excursion      Strength   Overall Strength  Comments hip abd B5/5, hip flex/knee flex/ext 5/5      Palpation   SI assessment  R PSIS more anterior                           [facility] Adult PT Treatment/Exercise - 07/10/21 0846       Therapeutic Activites    Other Therapeutic Activities cued for lengthenedd breathing to optimize parasympathetic system, tool to help with calming after coughing and to optimize pelvc floor activation      Neuro Re-ed    Neuro Re-ed Details  cued for proper sitting posture ( not crossing leg), standing posture ( not hyperextending knees), cued for HEP      Moist Heat Therapy   Number Minutes Moist Heat 5 Minutes    Moist Heat Location --   thoracic     Manual Therapy   Manual therapy comments STM/MWM at T10-12 to promote rotation, expansion of diaphragm                      PT Short Term Goals - 05/01/21 1313       PT SHORT TERM GOAL #1  Title Patient will demonstrate improved pelvic alignment and balance of musculature surrounding the pelvis to facilitate decreased PFM spasms and decrease pressure on nerve roots to allow for optimal PFM function    Baseline Has L foot-drop and pain in L glutes/knee as well as inability to sense PFM and L posterior innominate rotation/spasms surrounding pelvis.    Time 5    Period Weeks    Status Achieved    Target Date 11/26/19      PT SHORT TERM GOAL #2   Title Patient will demonstrate HEP x1 in the clinic to demonstrate understanding and proper form to allow for further improvement.    Baseline Pt. lacks knowledge of therapeutic ecersise that will decrease Sx.    Time 5    Period Weeks    Status Achieved    Target Date 11/26/19      PT SHORT TERM GOAL #3   Title Pt. will be able to walk 250 Ft.with CGA and w/o assistive device to demonstrate improved balance/decreased instability and foot drop.    Baseline Pt. using RW to AMB due to L foot-drop and increased instability.    Time 5    Period Weeks    Status Achieved     Target Date 08/20/19      PT SHORT TERM GOAL #4   Title Patient will demonstrate a coordinated contraction, relaxation, and bulge of the pelvic floor muscles to demonstrate functional recruitment and motion and allow for further strengthening.    Baseline Pt. reports inability to sense the PFM for kegel or relaxation, having mixed UI    Time 5    Period Weeks    Status Achieved    Target Date 08/20/19      PT SHORT TERM GOAL #5   Title Pt. will be able to demonstrate tall/half kneeling exercises for 10-15 seconds without increased pelvic pressure/UI sensation.    Baseline Pt. has avoided half-kneeling exercises because they increased Sx.    Time 6    Period Weeks    Status New    Target Date 06/12/21               PT Long Term Goals - 07/10/21 0855       PT LONG TERM GOAL #1   Title Patient will report no episodes of UUI/SUI over the course of the prior two weeks to demonstrate improved functional ability.    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 As of 4/30: She had been doing better but then took a step backward when she was sick and vomiting a lot. As of 7/9: Pt. continues to have decreased incontinence but had an episode of total bladder loss and UUI ~ 1-2 times/day still (worsened due to overuse/spasm of LB musculature. As of 11/12: She had one "big accident" and one "snexe-pee" accident over the week. As of 05/01/21: Pt. has had an increase in Sx. since becoming pregnant and has been fit for a pessary which she will begin using soon to prevent further UI worsening over the course of her pregnancy along with further PFM strengthening.  As of 07/10/21: no UUI and able to make it to the toilet before leakage, SUI with coughing occurred lately with colds    Time 10    Period Weeks    Status On-going      PT LONG TERM GOAL #2  Title Patient will report no pain with intercourse to  demonstrate improved functional ability.    Baseline having mild pain with initial penetration As of 1:29: had some pain at initial penetration and with deeper thrusting at ~ 3-4/10 but "not terrible" 10/17/20: has not attempted to know As of 05/01/21: Pt. had some radiating pain with pessary fitting even following release the prior Friday, reports "not as bad as it could've been".  07/10/21: no pain    Time 10    Period Weeks    Status Achieved      PT LONG TERM GOAL #3   Title Patient will score at or below 22/100 on the UDI and 27/100 on the UIQ to demonstrate a clinically meaningful decrease in disability and distress due to pelvic floor dysfunction.    Baseline UDI: 67, UIQ: 72/100 10-19-19: UDI 55.5, UIQ 76/100; POPDI 11/100 As of 1/29: UDI:33/100, UIQ: 6/100, POPDI:6/100. As of 4/30: Due to set-back from vomiting. As of 11/12: UDI: 17/100, UIQ: POPDI: 11/100,    Time 10    Period Weeks    Status Achieved      PT LONG TERM GOAL #4   Title Pt. 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    Baseline Pt. requiring RW for AMB due to L foot-drop and imbalance. As of 10/12: Pt. requiring some support (e.g. grocery cart) for > 20 min. of AMB or stairs.    Time 10    Period Weeks    Status Achieved      PT LONG TERM GOAL #5   Title Pt. will improve in FOTO score by 10 points to demonstrate improved function.    Baseline FOTO PFDI Urinary: 29, Urinary Problem:54    Time 10    Period Weeks    Status On-going      Additional Long Term Goals   Additional Long Term Goals Yes      PT LONG TERM GOAL #6   Title Patient will demonstrate 4+/5 strength or better in the PF and surrounding musculature to show improved functional strength for childcare and other vocational and recreational activities.    Baseline PFM 3/5, As of 04/17/21: 3+/5.  07/10/21: will reassess next session    Time 10     Period Weeks    Status On-going      PT LONG TERM GOAL #7   Title Pt will demo no R shoulder IR, levelled shoulder height, and no anterior rotation of R PSIS across 2 visits to demo IND with scoliosis HEP and pelvic stability.    Time 10    Period Weeks    Status New    Target Date 09/18/21                   Plan - 07/10/21 0855     Clinical Impression Statement Pt has achieved 2/7 of her LTG and progressing well towards remaining goals. Urge incontinence has improved. SUI is still an issue as pt is in her 26 week of pregnancy with her 2nd child. Pelvic girdle alignment is levelled with shoe lift in R shoe. Today, pt showed increased diaphragmatic excursion after gentle manual Tx R upper quadrant. Pt also demo'd a more levelled R shoulder compared to L post Tx which is addressing her scoliosis. Cued for body mechanics to minimize worsening of prolapse and proper pelvic girdle stability. Plan to  initiate deep core coordination to address pelvic floor contractions. With increased diaphragmatic excursion, pt was also cued to use lengthened breathing to help with her coughing spells and minimize upper trap overuse that accompanies excessive coughing and dyscoordination of pelvic floor. Pt will continue to benefit from skilled PT.    Personal Factors and Comorbidities Comorbidity 2    Examination-Activity Limitations Locomotion Level;Stand;Stairs;Lift;Squat;Toileting;Continence;Sit;Transfers;Caring for Others;Bend    Examination-Participation Restrictions Interpersonal Relationship;Yard Work;Community Activity;Meal Prep;Laundry    Stability/Clinical Decision Making Evolving/Moderate complexity    Rehab Potential Good    PT Frequency 1x / week    PT Duration Other (comment)    PT Treatment/Interventions ADLs/Self Care Home Management;Aquatic Therapy;Biofeedback;Electrical Stimulation;Gait training;Stair training;Balance training;Therapeutic exercise;Therapeutic activities;Functional mobility  training;Neuromuscular re-education;Patient/family education;Manual techniques;Scar mobilization;Dry needling;Taping;Spinal Manipulations;Joint Manipulations    PT Next Visit Plan practice multi-stepping, release adductors, re-assess pelvic alignment with heel-lift in place. discuss plan for strengthening and walking program, stabilize gait pattern, discuss benefits of aquatic exercise. Review toe-taps, re-assess PFM, TPDN to R posterior scapular stabilizers and spinal mobilization/ rotational mobs into R rotation and perform PNF for the scapula and rainbows on the Re-assess rib mobility and obliques/diaphragm, review deep-core coordination (tall kneel, knee step through, stepping, lean-backs); re-assess for Piriformis spasm (have performed DN and TP release now); check in with long term HEP performance    PT Home Exercise Plan day: Teapot, Side-planks, Planks, rows, scap retractions, (blue band). Mini-Marches with leg lift,  standing hip ABD and EXT, modified bridge, tall kneeling. lean-backs, calf raises.     B day: Child's pose V-sit/ single leg hamstring stretch,. Side stretch, Piriformis stretch, Door stretch,  stretch levator scap (neck) on R    Every day: Kegels 3-5x/day with long-hold and pelvic tilt with sneeze, laugh/cough kegel, vaginal weights, mult-direction stepping.        Corrective: Upper cervical rotation Tennis ball release added MET for R anterior for 1 week. Heel-lift for RLE. self external PFM release.    Consulted and Agree with Plan of Care Patient             Patient will benefit from skilled therapeutic intervention in order to improve the following deficits and impairments:  Abnormal gait, Decreased balance, Decreased endurance, Difficulty walking, Increased muscle spasms, Impaired tone, Improper body mechanics, Decreased activity tolerance, Decreased coordination, Decreased strength, Hypermobility, Postural dysfunction, Pain  Visit Diagnosis: Other muscle spasm  Foot drop,  left  Sacrococcygeal disorders, not elsewhere classified     Problem List Patient Active Problem List   Diagnosis Date Noted   Choroid plexus cyst of fetus on prenatal ultrasound 05/29/2021   History of vacuum extraction assisted delivery 04/03/2021   H/O macrosomia in infant in prior pregnancy, currently pregnant 04/03/2021   AMA (advanced maternal age) multigravida 35+, first trimester 04/03/2021   Hypermobility arthralgia 02/25/2021   Positive urine pregnancy test 02/25/2021   Chronic pain of right knee 09/17/2020   Urinary incontinence 08/17/2019   History of left foot drop 08/17/2019    Jerl Mina ,PT, DPT, E-RYT  07/10/2021, 11:47 AM  [place] [address] [place], [place], [address] Phone: [phone]   Fax:  [phone]  Name: Katryna Tschirhart MRN: [id number] Date of Birth: Dec 11, 1985

## 2021-07-17 ENCOUNTER — Other Ambulatory Visit: Payer: No Typology Code available for payment source

## 2021-07-17 ENCOUNTER — Encounter: Payer: Self-pay | Admitting: Obstetrics and Gynecology

## 2021-07-17 ENCOUNTER — Other Ambulatory Visit: Payer: Self-pay

## 2021-07-17 ENCOUNTER — Ambulatory Visit (INDEPENDENT_AMBULATORY_CARE_PROVIDER_SITE_OTHER): Payer: No Typology Code available for payment source | Admitting: Obstetrics and Gynecology

## 2021-07-17 VITALS — BP 100/67 | HR 78 | Wt 137.5 lb

## 2021-07-17 DIAGNOSIS — Z3A27 27 weeks gestation of pregnancy: Secondary | ICD-10-CM

## 2021-07-17 DIAGNOSIS — Z3482 Encounter for supervision of other normal pregnancy, second trimester: Secondary | ICD-10-CM

## 2021-07-17 DIAGNOSIS — Z23 Encounter for immunization: Secondary | ICD-10-CM

## 2021-07-17 DIAGNOSIS — Z3402 Encounter for supervision of normal first pregnancy, second trimester: Secondary | ICD-10-CM

## 2021-07-17 LAB — POCT URINALYSIS DIPSTICK OB
Bilirubin, UA: NEGATIVE
Blood, UA: NEGATIVE
Glucose, UA: NEGATIVE
Ketones, UA: NEGATIVE
Leukocytes, UA: NEGATIVE
Nitrite, UA: NEGATIVE
POC,PROTEIN,UA: NEGATIVE
Spec Grav, UA: 1.02 (ref 1.010–1.025)
Urobilinogen, UA: 0.2 E.U./dL
pH, UA: 8 (ref 5.0–8.0)

## 2021-07-17 MED ORDER — CARESTART COVID-19 HOME TEST VI KIT
PACK | 0 refills | Status: DC
  Filled 2021-07-17: qty 2, 4d supply, fill #0

## 2021-07-17 MED ORDER — TETANUS-DIPHTH-ACELL PERTUSSIS 5-2.5-18.5 LF-MCG/0.5 IM SUSY
0.5000 mL | PREFILLED_SYRINGE | Freq: Once | INTRAMUSCULAR | Status: AC
  Administered 2021-07-17: 0.5 mL via INTRAMUSCULAR

## 2021-07-17 NOTE — Progress Notes (Signed)
ROB: Discussed previous complications of last birth including macrosomia, vacuum delivery, pelvic floor issues.  She has had a cough over the last 2 weeks but this seems to be improving. (Son had a virus) has tested COVID-negative several times.  1 hour GCT today.  Plan for 36-week ultrasound for growth. (History of macrosomia)

## 2021-07-17 NOTE — Patient Instructions (Addendum)
Breastfeeding and Breast Care It is normal to have some problems when you start to breastfeed your new baby. But there are things that you can do to take care of yourself and help prevent problems. This includes keeping your breasts healthy and making sure that your baby's mouth attaches (latches) properly to your nipple for feedings. Work with your doctor or breastfeeding specialist to find what works best foryou. How does self-care benefit me? If you keep your breasts healthy and you let your baby attach to your nipples in the right way, you will avoid these problems: Cracked or sore nipples. Breasts becoming overfilled with milk. Plugged milk ducts. Low milk supply. Breast swelling or infection. How does self-care benefit my baby? By preventing problems with your breasts, you will ensure that your baby willfeed well and will gain the right amount of weight. What actions can I take to care for myself during breastfeeding? Best ways to breastfeed Always make sure that your baby latches properly to breastfeed. Make sure that your baby is in a proper position. Try different breastfeeding positions to find one that works best for you and your baby. Breastfeed when you feel like you need to make your breasts less full or when your baby shows signs of hunger. This is called "breastfeeding on demand." Do not delay feedings. Try to relax when it is time to feed your baby. This helps your body release milk from your breast. To help increase milk flow, do these things before feeding: Remove a small amount of milk from your breast. Use a pump or squeeze with your hand. Apply warm, moist heat to your breast. Do this in the shower or use hand towels soaked with warm water. Massage your breasts. Do this when you are breastfeeding as well. Caring for your breasts     To help your breasts stay healthy and keep them from getting too dry: Avoid using soap on your nipples. Let your nipples air-dry for  3-4 minutes after each feeding. Do not use things like a hair dryer to dry your breasts. This can make the skin dry and will cause irritation and pain. Use only cotton bra pads to soak up breast milk that leaks. Change the pads if they become soaked with milk. If you use bra pads that can be thrown away, change them often. Put some lanolin on your nipples after breastfeeding. Pure lanolin does not need to be washed off your nipple before you feed your baby again. Pure lanolin is not harmful to your baby. Rub some breast milk into your nipples: Use your hand to squeeze out a few drops of breast milk. Gently massage the milk into your nipples. Let your nipples air-dry. Wear a supportive nursing bra. Avoid wearing: Tight clothing. Underwire bras or bras that put pressure on your breasts. Use ice to help relieve pain or swelling of your breasts: Put ice in a plastic bag. Place a towel between your skin and the bag. Leave the ice on for 20 minutes, 2-3 times a day. Follow these instructions at home: Drink enough fluid to keep your pee (urine) pale yellow. Get plenty of rest. Sleep when your baby sleeps. Talk to your doctor or breastfeeding specialist before taking any herbal supplements. Eat a balanced diet. This includes fruits, vegetables, whole grains, lean proteins, and dairy or dairy alternatives Contact a health care provider if: You have nipple pain. You have cracking or soreness in your nipples that lasts longer than 1 week. Your breasts are  overfilled with milk, and this lasts longer than 48 hours. You have a fever. You have pus-like fluid coming from your nipple. You have redness, a rash, swelling, itching, or burning on your breast. Your baby does not gain weight. Your baby loses weight. Your baby is not feeding regularly or is very sleepy and lacks energy. Summary There are things that you can do to take care of yourself and help prevent many common breastfeeding  problems. Always make sure that your baby's mouth attaches (latches) to your nipple properly to breastfeed. Keep your nipples from getting too dry, drink plenty of fluid, and get plenty of rest. Feed on demand. Do not delay feedings. This information is not intended to replace advice given to you by your health care provider. Make sure you discuss any questions you have with your healthcare provider. Document Revised: 05/13/2020 Document Reviewed: 05/13/2020 Elsevier Patient Education  2022 Elsevier Inc.     Third Trimester of Pregnancy  The third trimester of pregnancy is from week 28 through week 40. This is also called months 7 through 9. This trimester is when your unborn baby (fetus) is growing very fast. At the end of the ninth month, the unborn baby is about20 inches long. It weighs about 6-10 pounds. Body changes during your third trimester Your body continues to go through many changes during this time. The changesvary and generally return to normal after the baby is born. Physical changes Your weight will continue to increase. You may gain 25-35 pounds (11-16 kg) by the end of the pregnancy. If you are underweight, you may gain 28-40 lb (about 13-18 kg). If you are overweight, you may gain 15-25 lb (about 7-11 kg). You may start to get stretch marks on your hips, belly (abdomen), and breasts. Your breasts will continue to grow and may hurt. A yellow fluid (colostrum) may leak from your breasts. This is the first milk you are making for your baby. You may have changes in your hair. Your belly button may stick out. You may have more swelling in your hands, face, or ankles. Health changes You may have heartburn. You may have trouble pooping (constipation). You may get hemorrhoids. These are swollen veins in the butt that can itch or get painful. You may have swollen veins (varicose veins) in your legs. You may have more body aches in the pelvis, back, or thighs. You may have more  tingling or numbness in your hands, arms, and legs. The skin on your belly may also feel numb. You may feel short of breath as your womb (uterus) gets bigger. Other changes You may pee (urinate) more often. You may have more problems sleeping. You may notice the unborn baby "dropping," or moving lower in your belly. You may have more discharge coming from your vagina. Your joints may feel loose, and you may have pain around your pelvic bone. Follow these instructions at home: Medicines Take over-the-counter and prescription medicines only as told by your doctor. Some medicines are not safe during pregnancy. Take a prenatal vitamin that contains at least 600 micrograms (mcg) of folic acid. Eating and drinking Eat healthy meals that include: Fresh fruits and vegetables. Whole grains. Good sources of protein, such as meat, eggs, or tofu. Low-fat dairy products. Avoid raw meat and unpasteurized juice, milk, and cheese. These carry germs that can harm you and your baby. Eat 4 or 5 small meals rather than 3 large meals a day. You may need to take these actions to prevent   or treat trouble pooping: Drink enough fluids to keep your pee (urine) pale yellow. Eat foods that are high in fiber. These include beans, whole grains, and fresh fruits and vegetables. Limit foods that are high in fat and sugar. These include fried or sweet foods. Activity Exercise only as told by your doctor. Stop exercising if you start to have cramps in your womb. Avoid heavy lifting. Do not exercise if it is too hot or too humid, or if you are in a place of great height (high altitude). If you choose to, you may have sex unless your doctor tells you not to. Relieving pain and discomfort Take breaks often, and rest with your legs raised (elevated) if you have leg cramps or low back pain. Take warm water baths (sitz baths) to soothe pain or discomfort caused by hemorrhoids. Use hemorrhoid cream if your doctor  approves. Wear a good support bra if your breasts are tender. If you develop bulging, swollen veins in your legs: Wear support hose as told by your doctor. Raise your feet for 15 minutes, 3-4 times a day. Limit salt in your food. Safety Talk to your doctor before traveling far distances. Do not use hot tubs, steam rooms, or saunas. Wear your seat belt at all times when you are in a car. Talk with your doctor if someone is hurting you or yelling at you a lot. Preparing for your baby's arrival To prepare for the arrival of your baby: Take prenatal classes. Visit the hospital and tour the maternity area. Buy a rear-facing car seat. Learn how to install it in your car. Prepare the baby's room. Take out all pillows and stuffed animals from the baby's crib. General instructions Avoid cat litter boxes and soil used by cats. These carry germs that can cause harm to the baby and can cause a loss of your baby by miscarriage or stillbirth. Do not douche or use tampons. Do not use scented sanitary pads. Do not smoke or use any products that contain nicotine or tobacco. If you need help quitting, ask your doctor. Do not drink alcohol. Do not use herbal medicines, illegal drugs, or medicines that were not approved by your doctor. Chemicals in these products can affect your baby. Keep all follow-up visits. This is important. Where to find more information American Pregnancy Association: americanpregnancy.org [employer]: www.acog.org Office on Women's Health: KeywordPortfolios.com.br Contact a doctor if: You have a fever. You have mild cramps or pressure in your lower belly. You have a nagging pain in your belly area. You vomit, or you have watery poop (diarrhea). You have bad-smelling fluid coming from your vagina. You have pain when you pee, or your pee smells bad. You have a headache that does not go away when you take medicine. You have changes in  how you see, or you see spots in front of your eyes. Get help right away if: Your water breaks. You have regular contractions that are less than 5 minutes apart. You are spotting or bleeding from your vagina. You have very bad belly cramps or pain. You have trouble breathing. You have chest pain. You faint. You have not felt the baby move for the amount of time told by your doctor. You have new or increased pain, swelling, or redness in an arm or leg. Summary The third trimester is from week 28 through week 40 (months 7 through 9). This is the time when your unborn baby is growing very fast. During this  time, your discomfort may increase as you gain weight and as your baby grows. Get ready for your baby to arrive by taking prenatal classes, buying a rear-facing car seat, and preparing the baby's room. Get help right away if you are bleeding from your vagina, you have chest pain and trouble breathing, or you have not felt the baby move for the amount of time told by your doctor. This information is not intended to replace advice given to you by your health care provider. Make sure you discuss any questions you have with your healthcare provider. Document Revised: 04/30/2020 Document Reviewed: 03/06/2020 Elsevier Patient Education  [place]. Common Medications Safe in Pregnancy  Acne:      Constipation:  Benzoyl Peroxide     Colace  Clindamycin      Dulcolax Suppository  Topica Erythromycin     Fibercon  Salicylic Acid      Metamucil         Miralax AVOID:        Senakot   Accutane    Cough:  Retin-A       Cough Drops  Tetracycline      Phenergan w/ Codeine if Rx  Minocycline      Robitussin (Plain & DM)  Antibiotics:     Crabs/Lice:  Ceclor       RID  Cephalosporins    AVOID:  E-Mycins      Kwell  Keflex  Macrobid/Macrodantin   Diarrhea:  Penicillin      Kao-Pectate  Zithromax      Imodium AD         PUSH  FLUIDS AVOID:       Cipro     Fever:  Tetracycline      Tylenol (Regular or Extra  Minocycline       Strength)  Levaquin      Extra Strength-Do not          Exceed 8 tabs/24 hrs Caffeine:        <235m/day (equiv. To 1 cup of coffee or  approx. 3 12 oz sodas)         Gas: Cold/Hayfever:       Gas-X  Benadryl      Mylicon  Claritin       Phazyme  **Claritin-D        Chlor-Trimeton    Headaches:  Dimetapp      ASA-Free Excedrin  Drixoral-Non-Drowsy     Cold Compress  Mucinex (Guaifenasin)     Tylenol (Regular or Extra  Sudafed/Sudafed-12 Hour     Strength)  **Sudafed PE Pseudoephedrine   Tylenol Cold & Sinus     Vicks Vapor Rub  Zyrtec  **AVOID if Problems With Blood Pressure         Heartburn: Avoid lying down for at least 1 hour after meals  Aciphex      Maalox     Rash:  Milk of Magnesia     Benadryl    Mylanta       1% Hydrocortisone Cream  Pepcid  Pepcid Complete   Sleep Aids:  Prevacid      Ambien   Prilosec       Benadryl  Rolaids       Chamomile Tea  Tums (Limit 4/day)     Unisom         Tylenol PM         Warm milk-add vanilla or  Hemorrhoids:       Sugar  for taste  Anusol/Anusol H.C.  (RX: Analapram 2.5%)  Sugar Substitutes:  Hydrocortisone OTC     Ok in moderation  Preparation H      Tucks        Vaseline lotion applied to tissue with wiping    Herpes:     Throat:  Acyclovir      Oragel  Famvir  Valtrex     Vaccines:         Flu Shot Leg Cramps:       *Gardasil  Benadryl      Hepatitis A         Hepatitis B Nasal Spray:       Pneumovax  Saline Nasal Spray     Polio Booster         Tetanus Nausea:       Tuberculosis test or PPD  Vitamin B6 25 mg TID   AVOID:    Dramamine      *Gardasil  Emetrol       Live Poliovirus  Ginger Root 250 mg QID    MMR (measles, mumps &  High Complex Carbs @ Bedtime    rebella)  Sea Bands-Accupressure    Varicella (Chickenpox)  Unisom 1/2 tab TID     *No known complications           If received  before Pain:         Known pregnancy;   Darvocet       Resume series after  Lortab        Delivery  Percocet    Yeast:   Tramadol      Femstat  Tylenol 3      Gyne-lotrimin  Ultram       Monistat  Vicodin           MISC:         All Sunscreens           Hair Coloring/highlights          Insect Repellant's          (Including DEET)         Mystic Tans   Tdap (Tetanus, Diphtheria, Pertussis) Vaccine: What You Need to Know 1. Why get vaccinated? Tdap vaccine can prevent tetanus, diphtheria, and pertussis. Diphtheria and pertussis spread from person to person. Tetanus enters the body through cuts or wounds. TETANUS (T) causes painful stiffening of the muscles. Tetanus can lead to serious health problems, including being unable to open the mouth, having trouble swallowing and breathing, or death. DIPHTHERIA (D) can lead to difficulty breathing, heart failure, paralysis, or death. PERTUSSIS (aP), also known as "whooping cough," can cause uncontrollable, violent coughing that makes it hard to breathe, eat, or drink. Pertussis can be extremely serious especially in babies and young children, causing pneumonia, convulsions, brain damage, or death. In teens and adults, it can cause weight loss, loss of bladder control, passing out, and rib fractures from severe coughing. 2. Tdap vaccine Tdap is only for children 7 years and older, adolescents, and adults.  Adolescents should receive a single dose of Tdap, preferably at age 15 or 69 years. Pregnant people should get a dose of Tdap during every pregnancy, preferably during the early part of the third trimester, to help protect the newborn from pertussis. Infants are most at risk for severe, life-threatening complications frompertussis. Adults who have never received Tdap should get a dose of Tdap. Also, adults should receive a booster dose of either Tdap or Td (  a different vaccine that protects against tetanus and diphtheria but not pertussis) every  10 years, or after 5 years in the case of a severe or dirty wound or burn. Tdap may be given at the same time as other vaccines. 3. Talk with your health care provider Tell your vaccine provider if the person getting the vaccine: Has had an allergic reaction after a previous dose of any vaccine that protects against tetanus, diphtheria, or pertussis, or has any severe, life-threatening allergies Has had a coma, decreased level of consciousness, or prolonged seizures within 7 days after a previous dose of any pertussis vaccine (DTP, DTaP, or Tdap) Has seizures or another nervous system problem Has ever had Guillain-Barr Syndrome (also called "GBS") Has had severe pain or swelling after a previous dose of any vaccine that protects against tetanus or diphtheria In some cases, your health care provider may decide to postpone Tdapvaccination until a future visit. People with minor illnesses, such as a cold, may be vaccinated. People who are moderately or severely ill should usually wait until they recover beforegetting Tdap vaccine.  Your health care provider can give you more information. 4. Risks of a vaccine reaction Pain, redness, or swelling where the shot was given, mild fever, headache, feeling tired, and nausea, vomiting, diarrhea, or stomachache sometimes happen after Tdap vaccination. People sometimes faint after medical procedures, including vaccination. Tellyour provider if you feel dizzy or have vision changes or ringing in the ears.  As with any medicine, there is a very remote chance of a vaccine causing asevere allergic reaction, other serious injury, or death. 5. What if there is a serious problem? An allergic reaction could occur after the vaccinated person leaves the clinic. If you see signs of a severe allergic reaction (hives, swelling of the face and throat, difficulty breathing, a fast heartbeat, dizziness, or weakness), call [phone] get the person to the nearest hospital. For  other signs that concern you, call your health care provider.  Adverse reactions should be reported to the Vaccine Adverse Event Reporting System (VAERS). Your health care provider will usually file this report, or you can do it yourself. Visit the VAERS website at www.vaers.SamedayNews.es or call 2600540620. VAERS is only for reporting reactions, and VAERS staff members do not give medical advice. 6. The National Vaccine Injury Compensation Program The [employer] Vaccine Injury Compensation Program (VICP) is a federal program that was created to compensate people who may have been injured by certain vaccines. Claims regarding alleged injury or death due to vaccination have a time limit for filing, which may be as short as two years. Visit the VICP website at GoldCloset.com.ee or call 438-489-1023to learn about the program and about filing a claim. 7. How can I learn more? Ask your health care provider. Call your local or state health department. Visit the website of the Food and Drug Administration (FDA) for vaccine package inserts and additional information at TraderRating.uy. Contact the Centers for Disease Control and Prevention (CDC): Call (318)205-1286 (1-800-CDC-INFO) or Visit CDC's website at http://hunter.com/. Vaccine Information Statement Tdap (Tetanus, Diphtheria, Pertussis) Vaccine(07/11/2020) This information is not intended to replace advice given to you by your health care provider. Make sure you discuss any questions you have with your healthcare provider. Document Revised: 08/06/2020 Document Reviewed: 08/06/2020 Elsevier Patient Education  2022 [employer].

## 2021-07-17 NOTE — Progress Notes (Signed)
OB-Pt present for routine prenatal care. Pt c/o coughing, pelvic pressure and sore abd area.

## 2021-07-19 LAB — CBC
Hematocrit: 38.5 % (ref 34.0–46.6)
Hemoglobin: 13 g/dL (ref 11.1–15.9)
MCH: 30.2 pg (ref 26.6–33.0)
MCHC: 33.8 g/dL (ref 31.5–35.7)
MCV: 90 fL (ref 79–97)
Platelets: 219 10*3/uL (ref 150–450)
RBC: 4.3 x10E6/uL (ref 3.77–5.28)
RDW: 12.5 % (ref 11.7–15.4)
WBC: 9.8 10*3/uL (ref 3.4–10.8)

## 2021-07-19 LAB — GLUCOSE, 1 HOUR GESTATIONAL: Gestational Diabetes Screen: 99 mg/dL (ref 65–139)

## 2021-07-19 LAB — RPR: RPR Ser Ql: NONREACTIVE

## 2021-07-24 ENCOUNTER — Other Ambulatory Visit: Payer: Self-pay

## 2021-07-24 ENCOUNTER — Ambulatory Visit: Payer: No Typology Code available for payment source

## 2021-07-24 DIAGNOSIS — M533 Sacrococcygeal disorders, not elsewhere classified: Secondary | ICD-10-CM

## 2021-07-24 DIAGNOSIS — M21372 Foot drop, left foot: Secondary | ICD-10-CM

## 2021-07-24 DIAGNOSIS — M62838 Other muscle spasm: Secondary | ICD-10-CM

## 2021-07-24 NOTE — Therapy (Signed)
[place] MAIN [facility] SERVICES [address] [place], [place], [address] Phone: [phone]   Fax:  [phone]  Physical Therapy Treatment  The patient has been informed of current processes in place at Outpatient Rehab to protect patients from Covid-19 exposure including social distancing, schedule modifications, and new cleaning procedures. After discussing their particular risk with a therapist based on the patient's personal risk factors, the patient has decided to proceed with in-person therapy.   Patient Details  Name: Lisa Brady MRN: [id number] Date of Birth: 11-Oct-1986 No data recorded  Encounter Date: 07/24/2021   PT End of Session - 07/24/21 1016     Visit Number 66    Number of Visits 9    Date for PT Re-Evaluation 42/35/36   recert 12/09/41   Authorization Type [place]    Authorization - Visit Number 2    Authorization - Number of Visits 10    Progress Note Due on Visit 48    PT Start Time 1540    PT Stop Time 0835    PT Time Calculation (min) 60 min    Activity Tolerance Patient tolerated treatment well;No increased pain    Behavior During Therapy [facility] for tasks assessed/performed             Past Medical History:  Diagnosis Date   Dysplastic nevus 04/01/2021   right medial forearm, mod Atypia    Dysplastic nevus 04/01/2021   right inferior axilla, mild atypia    Dysplastic nevus 04/01/2021   right superior medial scapula, mild atypia    Foot drop    Hypermobility arthralgia    Maternal varicella, non-immune 11/20/2018   Scoliosis    Urinary incontinence     Past Surgical History:  Procedure Laterality Date   WISDOM TOOTH EXTRACTION  2006    There were no vitals filed for this visit.  Pelvic Floor Physical Therapy Treatment Note  SCREENING  Changes in medications, allergies, or medical history?: pregnant    SUBJECTIVE  Patient reports: She is having some leakage with coughing and was  having UUI more when she was sick a week ago. She got a warm sensation that travelled down the front of her R thigh that was associated with heaviness in the RLE. It only lasted ~ 5 seconds but happened 3 days in a row. Her upper back has been bothering her the last couple nights her R hip occasionally bothers her.   Precautions:  She is [redacted] weeks pregnant,  Hypermobility and Scoliosis  Pain update:  Location of pain:  mid/upper back hip/side (neck) Current pain:  2/10 (0/10 with turning) Max pain:  4/10  (0/10) Least pain:  0/10 (0/10) Nature of pain: tightness/achy  **no increased pain following treatment   Patient Goals: Control her bladder/not need depends.   OBJECTIVE  Changes in:   Observation:  Slight R anterior innominate rotation (from previous session)  R scapula protracted and winging>L (from prior session)  12/26/20: alignment not assessed before MMT performed, audble "pop" when performing MMT for L adduction and then pelvis assessed and appropriately aligned in supine.  01/09/21: Pt. Demonstrates R anterior rotation, L up-slip and LLD with LLE long was finally detected. **PSIS appear level in standing with 2/3 heel-lift in R shoe.   03/06/21: mild R anterior rotation, resolved following treatment.  03/20/21: L up-slip and R anterior/L posterior rotation, possible R out-flare.  **following treatment pubic symphysis level, up-slip corrected but R anterion rotation remaining.  04/17/21: R out-flare.  05/08/21: TTP to B IC and coccygeus. Sytrnge ~ 2/5 in standing and 2+/5 in supine at BOS. ~ 3+ following treatment in supine, Pt. Able to sense in standing following treatment.   05/22/21: ASIS and PSIS appeared level in standing but in supine R ASIS low and ankles level (RLE is short-leg). At EOS ASIS level and RLE short (appropriate) in supine.  06/12/21: ASIS and PSIS appear level in standing.  06/26/21: R anterior rotation and L up-slip.  TODAY: mild R anterior rotation/  tension into rotation.   ROM/Mobility:  Decreased scapular depression and downward rotation ROM in R>L scapula. Decreased L cervical rotation with painful end-feel. Decreased mobility at pubic symphysis and pain with pressure at L Pubic ramus  (from prior session)  Pt demonstrates decreased upward rotation ROM for R scapula due to spasm/ fascial tightness that is limiting proprioception and recruitment for effective downward scapular rotation strengthening. (from previous session)  TODAY: decreased thoracic PIVM  Strength:  Muscle fatigue reached earlier on R>L with tall-kneeling/balance exercises. Imbalance surrounding R scapula not allowing Pt. To perform I's, Y's, T's W's well. (from prior session)  MMT LE  - L DF 4+/5 (different rater may account for difference but could be due to spasms in back since test performed prior to DN/manual) - R DF 5/5 (from prior session)  Strength/MMT:  LE MMT (12/26/20) LE MMT Left Right  Hip flex:  (L2) /5 /5  Hip ext: 5/5 5/5  Hip abd: 4+/5 5/5  Hip add: 4+/5 5/5  Hip IR 4/5 4+/5  Hip ER 5/5 5/5  *audible "pop" with mild discomfort noted with resisted L adduction.  (02-06-21) LE MMT Left Right  Hip flex:  (L2) /5 /5  Hip ext: -5/5 5/5  Hip abd: 5/5 5/5  Hip add: 4+/5 4/5!  Hip IR 4+/5 4+/5  Hip ER 4++/5 4++/5    06/12/21 LE MMT Left Right  Hip flex:  (L2) /5 /5  Hip ext: 5/5 5/5  Hip abd: 5/5 5/5  Hip add: 4+/5 4+/5  Hip IR 4+/5 4+/5  Hip ER 5-/5 5-/5    Deep-core strength/endurance sufficient to allow Pt. To perform toe-taps x5 in a row before rest needed.  As of 05/01/21: Pt. Demonstrated weakness in DF in fatiguing during BLE plantarflexion at 23 repetitions.  Pelvic floor: TTP through R posterior PR/PC and OI as well as 5 o'clock region of posterior fourchette. TTP to to L posterior fourchette at ~ 5 o'clock. Coordinated and able to keep probe in even at ~ 45 degrees. Increased force production when TA and obliques recruited. ~  3+/5 strength and maximal endurance at ~ 8 seconds.  (from prior session)  1/7:  Strength ~ 2+/5 initially with TTP to anterior and posterior PR/PC as well as decreased fascial mobility along anterior/inferior aspect of bladder.  **following treatment Pt. Able to achieve 3++/5 with decreased recruitment of the posterior PR/PC and IC near coccyx remaining. (from previous session)  01/09/21: TTP to R>L coccygeus, OI, and IC near coccyx. Strength ~ 3/5 and 3 sec. endurance with poor recruitment of posterior PFM.  **following treatment, strength ~ 3++/5 with much more squeeze from posterior PFM and ~ 8 sec. Hold.  04/03/21: TTP to B STP and bulbo as well as EUS and decreased mobility through 2nd layer fascia on R>L.  04/17/20: TTP to all except coccygeus on L and to R anterior and posterior PR/PC. 2/5 at BOS, Pt. demonstrated 3+/ 5 strength at EOS.  Palpation:  Greatest tenderness with pressure on spinous processes T5 and L2. (from previous session)  02/06/21: TTP to R TFL, iliopsoas, vastus lateralis and rectus femoris  03/06/21: TTP to R Iliacus, Psoas, TFL, and vastus lateralis.  03/20/21:  TTP to R iliacus L QL, adductor brevis and pectineus  04/17/21: TTP to B diaphragm.  05/22/21: TTP to L QL, R iliacus , TFL and lateral quads  06/12/21: TTP to B adductor magnus/longus  06/26/21: TTP to R OI and Piriformis, B QL.  TODAY: TTP to R TFL with decreased fascial mobility.  Gait Analysis: (from prior session) Gait speed: 1.52ms ; anterior pelvic tilt, knee hyperextension, heel strike - with cues pelvic neutral achieved, with appropriate arm swing and decreased heel strike.     INTERVENTIONS THIS SESSION:   Manual: Performed TP release to R L QL and R adductors and TFL followed by MFR using an edge tool to help decrease pressure on the lateral femoral cutaneous nerve and grade 3-4 PA mobs through the upper half of the thoracic spine to improve mobility, and decrease pain and spasm.  Theract:  reviewed importance of posture and bow-and-arrow for thoracic mobility maintenance and applied k-tape in a cross pattern posteriorly to support upper posture as well as reviewed correct use of belly-band to support the lower back.   Total time:  67m.                               PT Short Term Goals - 05/01/21 1313       PT SHORT TERM GOAL #1   Title Patient will demonstrate improved pelvic alignment and balance of musculature surrounding the pelvis to facilitate decreased PFM spasms and decrease pressure on nerve roots to allow for optimal PFM function    Baseline Has L foot-drop and pain in L glutes/knee as well as inability to sense PFM and L posterior innominate rotation/spasms surrounding pelvis.    Time 5    Period Weeks    Status Achieved    Target Date 11/26/19      PT SHORT TERM GOAL #2   Title Patient will demonstrate HEP x1 in the clinic to demonstrate understanding and proper form to allow for further improvement.    Baseline Pt. lacks knowledge of therapeutic ecersise that will decrease Sx.    Time 5    Period Weeks    Status Achieved    Target Date 11/26/19      PT SHORT TERM GOAL #3   Title Pt. will be able to walk 250 Ft.with CGA and w/o assistive device to demonstrate improved balance/decreased instability and foot drop.    Baseline Pt. using RW to AMB due to L foot-drop and increased instability.    Time 5    Period Weeks    Status Achieved    Target Date 08/20/19      PT SHORT TERM GOAL #4   Title Patient will demonstrate a coordinated contraction, relaxation, and bulge of the pelvic floor muscles to demonstrate functional recruitment and motion and allow for further strengthening.    Baseline Pt. reports inability to sense the PFM for kegel or relaxation, having mixed UI    Time 5    Period Weeks    Status Achieved    Target Date 08/20/19      PT SHORT TERM GOAL #5   Title Pt. will be able to demonstrate tall/half kneeling  exercises for 10-15  seconds without increased pelvic pressure/UI sensation.    Baseline Pt. has avoided half-kneeling exercises because they increased Sx.    Time 6    Period Weeks    Status New    Target Date 06/12/21               PT Long Term Goals - 07/10/21 0855       PT LONG TERM GOAL #1   Title Patient will report no episodes of UUI/SUI over the course of the prior two weeks to demonstrate improved functional ability.    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 As of 4/30: She had been doing better but then took a step backward when she was sick and vomiting a lot. As of 7/9: Pt. continues to have decreased incontinence but had an episode of total bladder loss and UUI ~ 1-2 times/day still (worsened due to overuse/spasm of LB musculature. As of 11/12: She had one "big accident" and one "snexe-pee" accident over the week. As of 05/01/21: Pt. has had an increase in Sx. since becoming pregnant and has been fit for a pessary which she will begin using soon to prevent further UI worsening over the course of her pregnancy along with further PFM strengthening.  As of 07/10/21: no UUI and able to make it to the toilet before leakage, SUI with coughing occurred lately with colds    Time 10    Period Weeks    Status On-going      PT LONG TERM GOAL #2   Title Patient will report no pain with intercourse to demonstrate improved functional ability.    Baseline having mild pain with initial penetration As of 1:29: had some pain at initial penetration and with deeper thrusting at ~ 3-4/10 but "not terrible" 10/17/20: has not attempted to know As of 05/01/21: Pt. had some radiating pain with pessary fitting even following release the prior Friday, reports "not as bad as it could've been".  07/10/21: no pain    Time 10    Period Weeks    Status Achieved      PT LONG TERM GOAL #3   Title Patient will score  at or below 22/100 on the UDI and 27/100 on the UIQ to demonstrate a clinically meaningful decrease in disability and distress due to pelvic floor dysfunction.    Baseline UDI: 67, UIQ: 72/100 10-19-19: UDI 55.5, UIQ 76/100; POPDI 11/100 As of 1/29: UDI:33/100, UIQ: 6/100, POPDI:6/100. As of 4/30: Due to set-back from vomiting. As of 11/12: UDI: 17/100, UIQ: POPDI: 11/100,    Time 10    Period Weeks    Status Achieved      PT LONG TERM GOAL #4   Title Pt. 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    Baseline Pt. requiring RW for AMB due to L foot-drop and imbalance. As of 10/12: Pt. requiring some support (e.g. grocery cart) for > 20 min. of AMB or stairs.    Time 10    Period Weeks    Status Achieved      PT LONG TERM GOAL #5   Title Pt. will improve in FOTO score by 10 points to demonstrate improved function.    Baseline FOTO PFDI Urinary: 29, Urinary Problem:54    Time 10  Period Weeks    Status On-going      Additional Long Term Goals   Additional Long Term Goals Yes      PT LONG TERM GOAL #6   Title Patient will demonstrate 4+/5 strength or better in the PF and surrounding musculature to show improved functional strength for childcare and other vocational and recreational activities.    Baseline PFM 3/5, As of 04/17/21: 3+/5.  07/10/21: will reassess next session    Time 10    Period Weeks    Status On-going      PT LONG TERM GOAL #7   Title Pt will demo no R shoulder IR, levelled shoulder height, and no anterior rotation of R PSIS across 2 visits to demo IND with scoliosis HEP and pelvic stability.    Time 10    Period Weeks    Status New    Target Date 09/18/21                   Plan - 07/24/21 1017     Clinical Impression Statement Pt. Responded well to all interventions today, demonstrating improved thoracic mobility, decreased fascial restriction,  improved posture, as well as understanding and correct performance of all education and exercises provided today. They will continue to benefit from skilled physical therapy to work toward remaining goals and maximize function as well as decrease likelihood of symptom increase or recurrence.     PT Next Visit Plan re-assess pelvic alignment, Review toe-taps, re-assess PFM (if dyspareunia continues) , TP repease to R posterior scapular stabilizers and spinal mobilization/ rotational mobs into R rotation and perform PNF for the scapula and rainbows (side-lying ABD) on the R review hip IR/ER strengthening.    PT Home Exercise Plan day: Teapot, Side-planks, Planks, rows, scap retractions, (blue band). Mini-Marches with leg lift,  standing hip ABD and EXT, modified bridge, tall kneeling. lean-backs, calf raises.     B day: Child's pose V-sit/ single leg hamstring stretch,. Side stretch, Piriformis stretch, Door stretch,  stretch levator scap (neck) on R    Every day: Kegels 3-5x/day with long-hold and pelvic tilt with sneeze, laugh/cough kegel, vaginal weights, mult-direction stepping.        Corrective: Upper cervical rotation Tennis ball release added MET for R anterior for 1 week. Heel-lift for RLE. self external PFM release. Hip IR/ER    Consulted and Agree with Plan of Care Patient             Patient will benefit from skilled therapeutic intervention in order to improve the following deficits and impairments:     Visit Diagnosis: Other muscle spasm  Foot drop, left  Sacrococcygeal disorders, not elsewhere classified     Problem List Patient Active Problem List   Diagnosis Date Noted   Choroid plexus cyst of fetus on prenatal ultrasound 05/29/2021   History of vacuum extraction assisted delivery 04/03/2021   H/O macrosomia in infant in prior pregnancy, currently pregnant 04/03/2021   AMA (advanced maternal age) multigravida 1+, first trimester 04/03/2021   Hypermobility arthralgia  02/25/2021   Positive urine pregnancy test 02/25/2021   Chronic pain of right knee 09/17/2020   Urinary incontinence 08/17/2019   History of left foot drop 08/17/2019   Willa Rough DPT, ATC Willa Rough 07/24/2021, 10:38 AM  [place] MAIN [facility] SERVICES [address] [place], [place], [address] Phone: [phone]   Fax:  [phone]  Name: Angla Delahunt MRN: [id number] Date of  Birth: May 21, 1986

## 2021-07-24 NOTE — Patient Instructions (Signed)
Kinesiotaping for posture  Look for Sensitive skin ROCKTAPE brand if you are concerned about skin sensitivity to adhesive. Start by pulling the shoulders back and down and apply the tape starting just at the front of the shoulder (there is typically a little groove) and pull with 75-100% tension back and down to the opposite side while keeping the muscles engaged. Repeat on the other side. You may wear the tape for 2-3 days and shower with it on as long as your skin tolerates it and it does not peel off.   When removing, do so slowly and use alcohol to remove any adhesive left behind. Apply a calming lotion such as Aveeno or Eucerin with no fragrance if mild irritation occurs.

## 2021-08-07 ENCOUNTER — Other Ambulatory Visit: Payer: Self-pay

## 2021-08-07 ENCOUNTER — Ambulatory Visit: Payer: No Typology Code available for payment source | Attending: Obstetrics and Gynecology

## 2021-08-07 ENCOUNTER — Encounter: Payer: No Typology Code available for payment source | Admitting: Physical Therapy

## 2021-08-07 ENCOUNTER — Ambulatory Visit (INDEPENDENT_AMBULATORY_CARE_PROVIDER_SITE_OTHER): Payer: No Typology Code available for payment source | Admitting: Obstetrics and Gynecology

## 2021-08-07 ENCOUNTER — Encounter: Payer: Self-pay | Admitting: Obstetrics and Gynecology

## 2021-08-07 VITALS — BP 98/64 | HR 82 | Wt 137.9 lb

## 2021-08-07 DIAGNOSIS — M62838 Other muscle spasm: Secondary | ICD-10-CM | POA: Insufficient documentation

## 2021-08-07 DIAGNOSIS — M21372 Foot drop, left foot: Secondary | ICD-10-CM | POA: Insufficient documentation

## 2021-08-07 DIAGNOSIS — O09299 Supervision of pregnancy with other poor reproductive or obstetric history, unspecified trimester: Secondary | ICD-10-CM

## 2021-08-07 DIAGNOSIS — Z3A30 30 weeks gestation of pregnancy: Secondary | ICD-10-CM

## 2021-08-07 DIAGNOSIS — M533 Sacrococcygeal disorders, not elsewhere classified: Secondary | ICD-10-CM | POA: Insufficient documentation

## 2021-08-07 DIAGNOSIS — Z4689 Encounter for fitting and adjustment of other specified devices: Secondary | ICD-10-CM

## 2021-08-07 DIAGNOSIS — O09523 Supervision of elderly multigravida, third trimester: Secondary | ICD-10-CM

## 2021-08-07 DIAGNOSIS — Z3483 Encounter for supervision of other normal pregnancy, third trimester: Secondary | ICD-10-CM

## 2021-08-07 LAB — POCT URINALYSIS DIPSTICK OB
Bilirubin, UA: NEGATIVE
Blood, UA: NEGATIVE
Glucose, UA: NEGATIVE
Ketones, UA: NEGATIVE
Leukocytes, UA: NEGATIVE
Nitrite, UA: NEGATIVE
POC,PROTEIN,UA: NEGATIVE
Spec Grav, UA: 1.01 (ref 1.010–1.025)
Urobilinogen, UA: 0.2 E.U./dL
pH, UA: 7.5 (ref 5.0–8.0)

## 2021-08-07 NOTE — Patient Instructions (Signed)
   Focus on your side-lying hip Internal rotation 3x 15 reps on R, 2x15 on L. Keep/restart doing your multi-direction stepping exercise Buy a  set of cups and try cupping the R thigh instead of stretching since it is not working well. (Check email for link)

## 2021-08-07 NOTE — Therapy (Signed)
[place] MAIN [facility] SERVICES [address] [place], [place], [address] Phone: [phone]   Fax:  [phone]  Physical Therapy Treatment  The patient has been informed of current processes in place at Outpatient Rehab to protect patients from Covid-19 exposure including social distancing, schedule modifications, and new cleaning procedures. After discussing their particular risk with a therapist based on the patient's personal risk factors, the patient has decided to proceed with in-person therapy.  Patient Details  Name: Lisa Brady MRN: [id number] Date of Birth: Aug 05, 1986 No data recorded  Encounter Date: 08/07/2021   PT End of Session - 08/07/21 1052     Visit Number 70    Number of Visits 50    Date for PT Re-Evaluation 69/48/54   recert 05/07/69   Authorization Type [place]    Authorization - Visit Number 3    Authorization - Number of Visits 10    Progress Note Due on Visit 82    PT Start Time 0800    PT Stop Time 0900    PT Time Calculation (min) 60 min    Activity Tolerance Patient tolerated treatment well;No increased pain    Behavior During Therapy [facility] for tasks assessed/performed             Past Medical History:  Diagnosis Date   Dysplastic nevus 04/01/2021   right medial forearm, mod Atypia    Dysplastic nevus 04/01/2021   right inferior axilla, mild atypia    Dysplastic nevus 04/01/2021   right superior medial scapula, mild atypia    Foot drop    Hypermobility arthralgia    Maternal varicella, non-immune 11/20/2018   Scoliosis    Urinary incontinence     Past Surgical History:  Procedure Laterality Date   WISDOM TOOTH EXTRACTION  2006    There were no vitals filed for this visit.  Pelvic Floor Physical Therapy Treatment Note  SCREENING  Changes in medications, allergies, or medical history?: pregnant    SUBJECTIVE  Patient reports: She had a coughing fit last night and had to change her  underwear from leakage. She is going to get a pessary today. Her R hip was hurting a little this morning but not bad. She has had a couple times where a little leaked out without sensory awareness and a couple times of UUI.   Precautions:  She is [redacted] weeks pregnant,  Hypermobility and Scoliosis  Pain update:  Location of pain:  mid/upper back hip/side (neck) Current pain:  2/10 (0/10 with turning) Max pain:  3/10  (0/10) Least pain:  0/10 (0/10) Nature of pain: tightness/achy  **no increased pain following treatment   Patient Goals: Control her bladder/not need depends.   OBJECTIVE  Changes in:   Observation:  Slight R anterior innominate rotation (from previous session)  R scapula protracted and winging>L (from prior session)  12/26/20: alignment not assessed before MMT performed, audble "pop" when performing MMT for L adduction and then pelvis assessed and appropriately aligned in supine.  01/09/21: Pt. Demonstrates R anterior rotation, L up-slip and LLD with LLE long was finally detected. **PSIS appear level in standing with 2/3 heel-lift in R shoe.   03/06/21: mild R anterior rotation, resolved following treatment.  03/20/21: L up-slip and R anterior/L posterior rotation, possible R out-flare.  **following treatment pubic symphysis level, up-slip corrected but R anterion rotation remaining.   04/17/21: R out-flare.  05/08/21: TTP to B IC and coccygeus. Sytrnge ~ 2/5 in standing and  2+/5 in supine at BOS. ~ 3+ following treatment in supine, Pt. Able to sense in standing following treatment.   05/22/21: ASIS and PSIS appeared level in standing but in supine R ASIS low and ankles level (RLE is short-leg). At EOS ASIS level and RLE short (appropriate) in supine.  06/12/21: ASIS and PSIS appear level in standing.  06/26/21: R anterior rotation and L up-slip.  07/24/21: mild R anterior rotation/ tension into rotation.   TODAY: AIS and PSIS level, RLE slightly externally rotated in  standing.   ROM/Mobility:  Decreased scapular depression and downward rotation ROM in R>L scapula. Decreased L cervical rotation with painful end-feel. Decreased mobility at pubic symphysis and pain with pressure at L Pubic ramus  (from prior session)  Pt demonstrates decreased upward rotation ROM for R scapula due to spasm/ fascial tightness that is limiting proprioception and recruitment for effective downward scapular rotation strengthening. (from previous session)  07/24/21: decreased thoracic PIVM  Strength:  Muscle fatigue reached earlier on R>L with tall-kneeling/balance exercises. Imbalance surrounding R scapula not allowing Pt. To perform I's, Y's, T's W's well. (from prior session)  MMT LE  - L DF 4+/5 (different rater may account for difference but could be due to spasms in back since test performed prior to DN/manual) - R DF 5/5 (from prior session)  Strength/MMT:  LE MMT (12/26/20) LE MMT Left Right  Hip flex:  (L2) /5 /5  Hip ext: 5/5 5/5  Hip abd: 4+/5 5/5  Hip add: 4+/5 5/5  Hip IR 4/5 4+/5  Hip ER 5/5 5/5  *audible "pop" with mild discomfort noted with resisted L adduction.  (02-06-21) LE MMT Left Right  Hip flex:  (L2) /5 /5  Hip ext: -5/5 5/5  Hip abd: 5/5 5/5  Hip add: 4+/5 4/5!  Hip IR 4+/5 4+/5  Hip ER 4++/5 4++/5    06/12/21 LE MMT Left Right  Hip flex:  (L2) /5 /5  Hip ext: 5/5 5/5  Hip abd: 5/5 5/5  Hip add: 4+/5 4+/5  Hip IR 4+/5 4+/5  Hip ER 5-/5 5-/5    Deep-core strength/endurance sufficient to allow Pt. To perform toe-taps x5 in a row before rest needed.  As of 05/01/21: Pt. Demonstrated weakness in DF in fatiguing during BLE plantarflexion at 23 repetitions.  Pelvic floor: TTP through R posterior PR/PC and OI as well as 5 o'clock region of posterior fourchette. TTP to to L posterior fourchette at ~ 5 o'clock. Coordinated and able to keep probe in even at ~ 45 degrees. Increased force production when TA and obliques recruited. ~ 3+/5 strength  and maximal endurance at ~ 8 seconds.  (from prior session)  1/7:  Strength ~ 2+/5 initially with TTP to anterior and posterior PR/PC as well as decreased fascial mobility along anterior/inferior aspect of bladder.  **following treatment Pt. Able to achieve 3++/5 with decreased recruitment of the posterior PR/PC and IC near coccyx remaining. (from previous session)  01/09/21: TTP to R>L coccygeus, OI, and IC near coccyx. Strength ~ 3/5 and 3 sec. endurance with poor recruitment of posterior PFM.  **following treatment, strength ~ 3++/5 with much more squeeze from posterior PFM and ~ 8 sec. Hold.  04/03/21: TTP to B STP and bulbo as well as EUS and decreased mobility through 2nd layer fascia on R>L.  04/17/20: TTP to all except coccygeus on L and to R anterior and posterior PR/PC. 2/5 at BOS, Pt. demonstrated 3+/ 5 strength at EOS.  Palpation:  Greatest  tenderness with pressure on spinous processes T5 and L2. (from previous session)  02/06/21: TTP to R TFL, iliopsoas, vastus lateralis and rectus femoris  03/06/21: TTP to R Iliacus, Psoas, TFL, and vastus lateralis.  03/20/21:  TTP to R iliacus L QL, adductor brevis and pectineus  04/17/21: TTP to B diaphragm.  05/22/21: TTP to L QL, R iliacus , TFL and lateral quads  06/12/21: TTP to B adductor magnus/longus  06/26/21: TTP to R OI and Piriformis, B QL.  07/24/21: TTP to R TFL with decreased fascial mobility.  TODAY: TTP to R adductor brevis, gracilis, vastus intermedius, OI, Piriformis, and TFL  Gait Analysis: (from prior session) Gait speed: 1.12ms ; anterior pelvic tilt, knee hyperextension, heel strike - with cues pelvic neutral achieved, with appropriate arm swing and decreased heel strike.     INTERVENTIONS THIS SESSION:   Manual: Performed TP release to R adductor brevis, gracilis, vastus intermedius, OI, Piriformis, and TFL to decrease spasm and pain and allow for improved balance of musculature for improved function and decreased  symptoms.  Therex: Reviewed and practiced R hip IR strengthening in side-lying and discussed the importance of prioritizing multi-direction stepping exercise. Attempted to find TFL alternate stretch, opted to have Pt. Buy cupping cups for time being due to pregnancy and hypermobility complicating ability to get safe stretch.  Total time:  673m.                               PT Short Term Goals - 05/01/21 1313       PT SHORT TERM GOAL #1   Title Patient will demonstrate improved pelvic alignment and balance of musculature surrounding the pelvis to facilitate decreased PFM spasms and decrease pressure on nerve roots to allow for optimal PFM function    Baseline Has L foot-drop and pain in L glutes/knee as well as inability to sense PFM and L posterior innominate rotation/spasms surrounding pelvis.    Time 5    Period Weeks    Status Achieved    Target Date 11/26/19      PT SHORT TERM GOAL #2   Title Patient will demonstrate HEP x1 in the clinic to demonstrate understanding and proper form to allow for further improvement.    Baseline Pt. lacks knowledge of therapeutic ecersise that will decrease Sx.    Time 5    Period Weeks    Status Achieved    Target Date 11/26/19      PT SHORT TERM GOAL #3   Title Pt. will be able to walk 250 Ft.with CGA and w/o assistive device to demonstrate improved balance/decreased instability and foot drop.    Baseline Pt. using RW to AMB due to L foot-drop and increased instability.    Time 5    Period Weeks    Status Achieved    Target Date 08/20/19      PT SHORT TERM GOAL #4   Title Patient will demonstrate a coordinated contraction, relaxation, and bulge of the pelvic floor muscles to demonstrate functional recruitment and motion and allow for further strengthening.    Baseline Pt. reports inability to sense the PFM for kegel or relaxation, having mixed UI    Time 5    Period Weeks    Status Achieved    Target Date  08/20/19      PT SHORT TERM GOAL #5   Title Pt. will be able to demonstrate tall/half kneeling exercises  for 10-15 seconds without increased pelvic pressure/UI sensation.    Baseline Pt. has avoided half-kneeling exercises because they increased Sx.    Time 6    Period Weeks    Status New    Target Date 06/12/21               PT Long Term Goals - 07/10/21 0855       PT LONG TERM GOAL #1   Title Patient will report no episodes of UUI/SUI over the course of the prior two weeks to demonstrate improved functional ability.    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 As of 4/30: She had been doing better but then took a step backward when she was sick and vomiting a lot. As of 7/9: Pt. continues to have decreased incontinence but had an episode of total bladder loss and UUI ~ 1-2 times/day still (worsened due to overuse/spasm of LB musculature. As of 11/12: She had one "big accident" and one "snexe-pee" accident over the week. As of 05/01/21: Pt. has had an increase in Sx. since becoming pregnant and has been fit for a pessary which she will begin using soon to prevent further UI worsening over the course of her pregnancy along with further PFM strengthening.  As of 07/10/21: no UUI and able to make it to the toilet before leakage, SUI with coughing occurred lately with colds    Time 10    Period Weeks    Status On-going      PT LONG TERM GOAL #2   Title Patient will report no pain with intercourse to demonstrate improved functional ability.    Baseline having mild pain with initial penetration As of 1:29: had some pain at initial penetration and with deeper thrusting at ~ 3-4/10 but "not terrible" 10/17/20: has not attempted to know As of 05/01/21: Pt. had some radiating pain with pessary fitting even following release the prior Friday, reports "not as bad as it could've been".  07/10/21: no pain     Time 10    Period Weeks    Status Achieved      PT LONG TERM GOAL #3   Title Patient will score at or below 22/100 on the UDI and 27/100 on the UIQ to demonstrate a clinically meaningful decrease in disability and distress due to pelvic floor dysfunction.    Baseline UDI: 67, UIQ: 72/100 10-19-19: UDI 55.5, UIQ 76/100; POPDI 11/100 As of 1/29: UDI:33/100, UIQ: 6/100, POPDI:6/100. As of 4/30: Due to set-back from vomiting. As of 11/12: UDI: 17/100, UIQ: POPDI: 11/100,    Time 10    Period Weeks    Status Achieved      PT LONG TERM GOAL #4   Title Pt. 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    Baseline Pt. requiring RW for AMB due to L foot-drop and imbalance. As of 10/12: Pt. requiring some support (e.g. grocery cart) for > 20 min. of AMB or stairs.    Time 10    Period Weeks    Status Achieved      PT LONG TERM GOAL #5   Title Pt. will improve in FOTO score by 10 points to demonstrate improved function.    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    Baseline PFM 3/5, As of 04/17/21: 3+/5.  07/10/21: will reassess next session    Time 10    Period Weeks    Status On-going      PT LONG TERM GOAL #7   Title Pt will demo no R shoulder IR, levelled shoulder height, and no anterior rotation of R PSIS across 2 visits to demo IND with scoliosis HEP and pelvic stability.    Time 10    Period Weeks    Status New    Target Date 09/18/21                   Plan - 08/07/21 1052     Clinical Impression Statement Pt. Responded well to  all interventions today, demonstrating improved alignment between-sessions, decreased spasm and TTP within session, as well as understanding and correct performance of all education and exercises provided today. They will continue to benefit from skilled physical therapy to work toward remaining goals and maximize function as well as decrease likelihood of symptom increase or recurrence.    PT Next Visit Plan re-assess pelvic alignment, Review toe-taps, re-assess PFM (if dyspareunia continues) , TP repease to R posterior scapular stabilizers and spinal mobilization/ rotational mobs into R rotation and perform PNF for the scapula and rainbows (side-lying ABD) on the R review hip IR/ER strengthening.    PT Home Exercise Plan day: Teapot, Side-planks, Planks, rows, scap retractions, (blue band). Mini-Marches with leg lift,  standing hip ABD and EXT, modified bridge, tall kneeling. lean-backs, calf raises.     B day: Child's pose V-sit/ single leg hamstring stretch,. Side stretch, Piriformis stretch, Door stretch,  stretch levator scap (neck) on R    Every day: Kegels 3-5x/day with long-hold and pelvic tilt with sneeze, laugh/cough kegel, vaginal weights, mult-direction stepping.        Corrective: Upper cervical rotation Tennis ball release added MET for R anterior for 1 week. Heel-lift for RLE. self external PFM release. Hip IR/ER    Consulted and Agree with Plan of Care Patient             Patient will benefit from skilled therapeutic intervention in order to improve the following deficits and impairments:     Visit Diagnosis: Other muscle spasm  Foot drop, left  Sacrococcygeal disorders, not elsewhere classified     Problem List Patient Active Problem List   Diagnosis Date Noted   Choroid plexus cyst of fetus on prenatal ultrasound 05/29/2021   History of vacuum extraction assisted delivery 04/03/2021   H/O macrosomia in infant in prior pregnancy, currently pregnant 04/03/2021   AMA  (advanced maternal age) multigravida 72+, first trimester 04/03/2021   Hypermobility arthralgia 02/25/2021   Positive urine pregnancy test 02/25/2021   Chronic pain of right knee 09/17/2020   Urinary incontinence 08/17/2019   History of left foot drop 08/17/2019   Willa Rough DPT, ATC Willa Rough 08/07/2021, 10:53 AM  [facility] [address] [place], [place], [address] Phone: [phone]   Fax:  [phone]  Name: Leon Goodnow  Z[place] MRN: [id number] Date of Birth: [birth year]

## 2021-08-07 NOTE — Patient Instructions (Addendum)
How to Use a Vaginal Pessary A vaginal pessary is a removable device that is placed into your vagina to support pelvic organs that droop. These organs include your uterus, bladder, and rectum. When your pelvic organs drop down into your vagina, it causes a condition called pelvic organ prolapse (POP). A pessary may be an alternative to surgery for women with POP. It may help women who leak urine when they strain or exercise (stress incontinence). This is a symptom of POP. A vaginal pessary may also be a temporary treatment for stress incontinence during pregnancy. There are several types of pessaries. All types are usually made of silicone. You can insert and remove some on your own. Other types must be inserted and removed by your health care provider at office visits. The reason you are using a pessary and the severity of your condition will determine which one is best for you. It is also important to find the right size. A pessary that is too small may fall out. A pessary that is too large may cause pain or discomfort. Your health care provider will do a physical exam to find the correct size and fit for your pessary. It may take several appointments to find the best fit for you. If you can be fit with the type of pessary that you can insert, remove, and clean yourself, your health care provider will teach you how to use your pessary at home. You may have checkups every few months. If you have the type of pessary that needs to be inserted and removed by your health care provider, you will have appointments every few months to have the pessary removed, cleaned, and replaced. What are the risks? When properly fitted and cared for, risks of using a vaginal pessary can be small. However, there can be problems that may include: Vaginal discharge. Vaginal bleeding. A bad smell coming from your vagina. Scraping of the skin inside your vagina. How to use your pessary Follow your health care provider's  instructions for using a pessary. These instructions may vary, depending on the type of pessary you have. To insert a pessary: Wash your hands with soap and water for at least 20 seconds. Squeeze or fold the pessary in half and lubricate the tip with a water-based lubricant. Insert the pessary into your vagina. It will unfold and provide support. To remove the pessary, gently tug it out of your vagina. You can remove the pessary every night or after several days. You can also remove it to have sex. How to care for your pessary If you have a pessary that you can remove: Clean your pessary with soap and water. Rinse well. Dry it completely before inserting it back into your vagina. Follow these instructions at home: Take over-the-counter and prescription medicines only as told by your health care provider. Your health care provider may prescribe an estrogen cream to moisten your vagina. Keep all follow-up visits. This is important. Contact a health care provider if: You feel any pain or discomfort when your pessary is in place. You continue to have stress incontinence. You have trouble keeping your pessary from falling out. You have an unusual vaginal discharge that is blood-tinged or smells bad. Summary A vaginal pessary is a removable device that is placed into your vagina to support pelvic organs that droop. This condition is called pelvic organ prolapse (POP). There are several types of pessaries. Some you can insert and remove on your own. Others must be inserted and removed  by your health care provider. The best type for you depends on the reason you are using a pessary and the severity of your condition. It is also important to find the right size. If you can use the type that you insert and remove on your own, your health care provider will teach you how to use it and schedule checkups every few months. If you have the type that needs to be inserted and removed by your health care  provider, you will have regular appointments to have your pessary removed, cleaned, and replaced. This information is not intended to replace advice given to you by your health care provider. Make sure you discuss any questions you have with your health care provider. Document Revised: 05/22/2020 Document Reviewed: 05/22/2020 [employer] Patient Education  [place].

## 2021-08-07 NOTE — Progress Notes (Signed)
OB-Pt present for routine prenatal care and pessary insertion. Pt stated fetal movement present; no contractions present; no vaginal bleeding and mucous in vaginal discharge.

## 2021-08-07 NOTE — Progress Notes (Signed)
ROB: Doing well, no major complaints.  Normal glucola and third trimester labs. Pessary placed today for pelvic floor relaxation and urinary incontinence (Size 2 3/4 incontinence dish with support). Reviewed pessary care and maintenance. RTC in 2 weeks.

## 2021-08-21 ENCOUNTER — Ambulatory Visit: Payer: No Typology Code available for payment source

## 2021-08-21 ENCOUNTER — Other Ambulatory Visit: Payer: Self-pay

## 2021-08-21 DIAGNOSIS — M21372 Foot drop, left foot: Secondary | ICD-10-CM

## 2021-08-21 DIAGNOSIS — M533 Sacrococcygeal disorders, not elsewhere classified: Secondary | ICD-10-CM

## 2021-08-21 DIAGNOSIS — M62838 Other muscle spasm: Secondary | ICD-10-CM | POA: Diagnosis not present

## 2021-08-21 NOTE — Therapy (Signed)
[place] MAIN [facility] SERVICES [address] [place], [place], [address] Phone: [phone]   Fax:  [phone]  Physical Therapy Treatment  The patient has been informed of current processes in place at Outpatient Rehab to protect patients from Covid-19 exposure including social distancing, schedule modifications, and new cleaning procedures. After discussing their particular risk with a therapist based on the patient's personal risk factors, the patient has decided to proceed with in-person therapy.   Patient Details  Name: Lisa Brady MRN: [id number] Date of Birth: 07-26-1986 No data recorded  Encounter Date: 08/21/2021   PT End of Session - 08/21/21 0901     Visit Number 71    Number of Visits 11    Date for PT Re-Evaluation 49/82/64   recert 12/10/81   Authorization Type [place]    Authorization - Visit Number 4    Authorization - Number of Visits 10    Progress Note Due on Visit 51    PT Start Time 0805    PT Stop Time 0900    PT Time Calculation (min) 55 min    Activity Tolerance Patient tolerated treatment well;No increased pain    Behavior During Therapy [facility] for tasks assessed/performed             Past Medical History:  Diagnosis Date   Dysplastic nevus 04/01/2021   right medial forearm, mod Atypia    Dysplastic nevus 04/01/2021   right inferior axilla, mild atypia    Dysplastic nevus 04/01/2021   right superior medial scapula, mild atypia    Foot drop    Hypermobility arthralgia    Maternal varicella, non-immune 11/20/2018   Scoliosis    Urinary incontinence     Past Surgical History:  Procedure Laterality Date   WISDOM TOOTH EXTRACTION  2006    There were no vitals filed for this visit.  Pelvic Floor Physical Therapy Treatment Note  SCREENING  Changes in medications, allergies, or medical history?: pregnant    SUBJECTIVE  Patient reports: She got her pessary and wore it through the labor day  weekend and she had a coughing fit and felt pressure all weekend. She had to do a ~ 65 yd. Hike downhill to see a waterfall. She went to remove it and it was right at the opening. She had a little more urgency when she removed it. She put it back in and it seemed like it stayed a little deeper by the time she took it out. Sh was having a lot of discharge with it in so she took it out again. When she has the pessary in she has ~ 50% more "stop and start" urination. She finally got her cups and used them a few times.   Precautions:  She is [redacted] weeks pregnant,  Hypermobility and Scoliosis  Pain update:  Location of pain:  mid/upper back hip/side (neck) Current pain:  2/10 (0/10 with turning) Max pain:  3/10  (0/10) Least pain:  0/10 (0/10) Nature of pain: tightness/achy  **no increased pain following treatment   Patient Goals: Control her bladder/not need depends.   OBJECTIVE  Changes in:   Observation:  Slight R anterior innominate rotation (from previous session)  R scapula protracted and winging>L (from prior session)  12/26/20: alignment not assessed before MMT performed, audble "pop" when performing MMT for L adduction and then pelvis assessed and appropriately aligned in supine.  01/09/21: Pt. Demonstrates R anterior rotation, L up-slip and LLD with LLE long  was finally detected. **PSIS appear level in standing with 2/3 heel-lift in R shoe.   03/06/21: mild R anterior rotation, resolved following treatment.  03/20/21: L up-slip and R anterior/L posterior rotation, possible R out-flare.  **following treatment pubic symphysis level, up-slip corrected but R anterion rotation remaining.   04/17/21: R out-flare.  05/08/21: TTP to B IC and coccygeus. Sytrnge ~ 2/5 in standing and 2+/5 in supine at BOS. ~ 3+ following treatment in supine, Pt. Able to sense in standing following treatment.   05/22/21: ASIS and PSIS appeared level in standing but in supine R ASIS low and ankles level (RLE  is short-leg). At EOS ASIS level and RLE short (appropriate) in supine.  06/12/21: ASIS and PSIS appear level in standing.  06/26/21: R anterior rotation and L up-slip.  07/24/21: mild R anterior rotation/ tension into rotation.   08/07/21: ASIS and PSIS level, RLE slightly externally rotated in standing.   TODAY: R anterior rotation and L up-slip  ROM/Mobility:  Decreased scapular depression and downward rotation ROM in R>L scapula. Decreased L cervical rotation with painful end-feel. Decreased mobility at pubic symphysis and pain with pressure at L Pubic ramus  (from prior session)  Pt demonstrates decreased upward rotation ROM for R scapula due to spasm/ fascial tightness that is limiting proprioception and recruitment for effective downward scapular rotation strengthening. (from previous session)  07/24/21: decreased thoracic PIVM  Strength:  Muscle fatigue reached earlier on R>L with tall-kneeling/balance exercises. Imbalance surrounding R scapula not allowing Pt. To perform I's, Y's, T's W's well. (from prior session)  MMT LE  - L DF 4+/5 (different rater may account for difference but could be due to spasms in back since test performed prior to DN/manual) - R DF 5/5 (from prior session)  Strength/MMT:  LE MMT (12/26/20) LE MMT Left Right  Hip flex:  (L2) /5 /5  Hip ext: 5/5 5/5  Hip abd: 4+/5 5/5  Hip add: 4+/5 5/5  Hip IR 4/5 4+/5  Hip ER 5/5 5/5  *audible "pop" with mild discomfort noted with resisted L adduction.  (02-06-21) LE MMT Left Right  Hip flex:  (L2) /5 /5  Hip ext: -5/5 5/5  Hip abd: 5/5 5/5  Hip add: 4+/5 4/5!  Hip IR 4+/5 4+/5  Hip ER 4++/5 4++/5    06/12/21 LE MMT Left Right  Hip flex:  (L2) /5 /5  Hip ext: 5/5 5/5  Hip abd: 5/5 5/5  Hip add: 4+/5 4+/5  Hip IR 4+/5 4+/5  Hip ER 5-/5 5-/5    Deep-core strength/endurance sufficient to allow Pt. To perform toe-taps x5 in a row before rest needed.  As of 05/01/21: Pt. Demonstrated weakness in DF in  fatiguing during BLE plantarflexion at 23 repetitions.  Pelvic floor: TTP through R posterior PR/PC and OI as well as 5 o'clock region of posterior fourchette. TTP to to L posterior fourchette at ~ 5 o'clock. Coordinated and able to keep probe in even at ~ 45 degrees. Increased force production when TA and obliques recruited. ~ 3+/5 strength and maximal endurance at ~ 8 seconds.  (from prior session)  1/7:  Strength ~ 2+/5 initially with TTP to anterior and posterior PR/PC as well as decreased fascial mobility along anterior/inferior aspect of bladder.  **following treatment Pt. Able to achieve 3++/5 with decreased recruitment of the posterior PR/PC and IC near coccyx remaining. (from previous session)  01/09/21: TTP to R>L coccygeus, OI, and IC near coccyx. Strength ~ 3/5 and 3 sec.  endurance with poor recruitment of posterior PFM.  **following treatment, strength ~ 3++/5 with much more squeeze from posterior PFM and ~ 8 sec. Hold.  04/03/21: TTP to B STP and bulbo as well as EUS and decreased mobility through 2nd layer fascia on R>L.  04/17/20: TTP to all except coccygeus on L and to R anterior and posterior PR/PC. 2/5 at BOS, Pt. demonstrated 3+/ 5 strength at EOS.  Palpation:  Greatest tenderness with pressure on spinous processes T5 and L2. (from previous session)  02/06/21: TTP to R TFL, iliopsoas, vastus lateralis and rectus femoris  03/06/21: TTP to R Iliacus, Psoas, TFL, and vastus lateralis.  03/20/21:  TTP to R iliacus L QL, adductor brevis and pectineus  04/17/21: TTP to B diaphragm.  05/22/21: TTP to L QL, R iliacus , TFL and lateral quads  06/12/21: TTP to B adductor magnus/longus  06/26/21: TTP to R OI and Piriformis, B QL.  07/24/21: TTP to R TFL with decreased fascial mobility.  08/07/21: TTP to R adductor brevis, gracilis, vastus intermedius, OI, Piriformis, and TFL  TODAY: TTP to R>L iliacus and psoas and L>R QL  Gait Analysis: (from prior session) Gait speed: 1.31ms ;  anterior pelvic tilt, knee hyperextension, heel strike - with cues pelvic neutral achieved, with appropriate arm swing and decreased heel strike.     INTERVENTIONS THIS SESSION:   Manual: Performed TP release to B iliopsoas and L QL followed by MET correctionx2 for R anterior rotation and then L up-slip correction to decrease spasm and pain and allow for improved balance of musculature for improved function and decreased symptoms.  Self-care: discussed pessary difficulty and trade-off between using it or not at this point. Size may not be appropriate but time needed to get another may be too long to be of use.  Total time:  666m.                                  PT Short Term Goals - 05/01/21 1313       PT SHORT TERM GOAL #1   Title Patient will demonstrate improved pelvic alignment and balance of musculature surrounding the pelvis to facilitate decreased PFM spasms and decrease pressure on nerve roots to allow for optimal PFM function    Baseline Has L foot-drop and pain in L glutes/knee as well as inability to sense PFM and L posterior innominate rotation/spasms surrounding pelvis.    Time 5    Period Weeks    Status Achieved    Target Date 11/26/19      PT SHORT TERM GOAL #2   Title Patient will demonstrate HEP x1 in the clinic to demonstrate understanding and proper form to allow for further improvement.    Baseline Pt. lacks knowledge of therapeutic ecersise that will decrease Sx.    Time 5    Period Weeks    Status Achieved    Target Date 11/26/19      PT SHORT TERM GOAL #3   Title Pt. will be able to walk 250 Ft.with CGA and w/o assistive device to demonstrate improved balance/decreased instability and foot drop.    Baseline Pt. using RW to AMB due to L foot-drop and increased instability.    Time 5    Period Weeks    Status Achieved    Target Date 08/20/19      PT SHORT TERM GOAL #4   Title Patient will  demonstrate a coordinated  contraction, relaxation, and bulge of the pelvic floor muscles to demonstrate functional recruitment and motion and allow for further strengthening.    Baseline Pt. reports inability to sense the PFM for kegel or relaxation, having mixed UI    Time 5    Period Weeks    Status Achieved    Target Date 08/20/19      PT SHORT TERM GOAL #5   Title Pt. will be able to demonstrate tall/half kneeling exercises for 10-15 seconds without increased pelvic pressure/UI sensation.    Baseline Pt. has avoided half-kneeling exercises because they increased Sx.    Time 6    Period Weeks    Status New    Target Date 06/12/21               PT Long Term Goals - 07/10/21 0855       PT LONG TERM GOAL #1   Title Patient will report no episodes of UUI/SUI over the course of the prior two weeks to demonstrate improved functional ability.    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 As of 4/30: She had been doing better but then took a step backward when she was sick and vomiting a lot. As of 7/9: Pt. continues to have decreased incontinence but had an episode of total bladder loss and UUI ~ 1-2 times/day still (worsened due to overuse/spasm of LB musculature. As of 11/12: She had one "big accident" and one "snexe-pee" accident over the week. As of 05/01/21: Pt. has had an increase in Sx. since becoming pregnant and has been fit for a pessary which she will begin using soon to prevent further UI worsening over the course of her pregnancy along with further PFM strengthening.  As of 07/10/21: no UUI and able to make it to the toilet before leakage, SUI with coughing occurred lately with colds    Time 10    Period Weeks    Status On-going      PT LONG TERM GOAL #2   Title Patient will report no pain with intercourse to demonstrate improved functional ability.    Baseline having mild pain with initial penetration As of  1:29: had some pain at initial penetration and with deeper thrusting at ~ 3-4/10 but "not terrible" 10/17/20: has not attempted to know As of 05/01/21: Pt. had some radiating pain with pessary fitting even following release the prior Friday, reports "not as bad as it could've been".  07/10/21: no pain    Time 10    Period Weeks    Status Achieved      PT LONG TERM GOAL #3   Title Patient will score at or below 22/100 on the UDI and 27/100 on the UIQ to demonstrate a clinically meaningful decrease in disability and distress due to pelvic floor dysfunction.    Baseline UDI: 67, UIQ: 72/100 10-19-19: UDI 55.5, UIQ 76/100; POPDI 11/100 As of 1/29: UDI:33/100, UIQ: 6/100, POPDI:6/100. As of 4/30: Due to set-back from vomiting. As of 11/12: UDI: 17/100, UIQ: POPDI: 11/100,    Time 10    Period Weeks    Status Achieved      PT LONG TERM GOAL #4   Title Pt. 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  Baseline Pt. requiring RW for AMB due to L foot-drop and imbalance. As of 10/12: Pt. requiring some support (e.g. grocery cart) for > 20 min. of AMB or stairs.    Time 10    Period Weeks    Status Achieved      PT LONG TERM GOAL #5   Title Pt. will improve in FOTO score by 10 points to demonstrate improved function.    Baseline FOTO PFDI Urinary: 29, Urinary Problem:54    Time 10    Period Weeks    Status On-going      Additional Long Term Goals   Additional Long Term Goals Yes      PT LONG TERM GOAL #6   Title Patient will demonstrate 4+/5 strength or better in the PF and surrounding musculature to show improved functional strength for childcare and other vocational and recreational activities.    Baseline PFM 3/5, As of 04/17/21: 3+/5.  07/10/21: will reassess next session    Time 10    Period Weeks    Status On-going      PT LONG TERM GOAL #7   Title Pt will demo no R shoulder IR,  levelled shoulder height, and no anterior rotation of R PSIS across 2 visits to demo IND with scoliosis HEP and pelvic stability.    Time 10    Period Weeks    Status New    Target Date 09/18/21                   Plan - 08/21/21 0902     Clinical Impression Statement Pt. Responded well to all interventions today, demonstrating improved pelvic alignment and decreased spasms/TTP as well as understanding and correct performance of all education and exercises provided today. They will continue to benefit from skilled physical therapy to work toward remaining goals and maximize function as well as decrease likelihood of symptom increase or recurrence.     PT Next Visit Plan re-assess pelvic alignment, Review toe-taps, re-assess PFM (if dyspareunia continues) , TP repease to R posterior scapular stabilizers and spinal mobilization/ rotational mobs into R rotation and perform PNF for the scapula and rainbows (side-lying ABD) on the R review hip IR/ER strengthening.    PT Home Exercise Plan day: Teapot, Side-planks, Planks, rows, scap retractions, (blue band). Mini-Marches with leg lift,  standing hip ABD and EXT, modified bridge, tall kneeling. lean-backs, calf raises.     B day: Child's pose V-sit/ single leg hamstring stretch,. Side stretch, Piriformis stretch, Door stretch,  stretch levator scap (neck) on R    Every day: Kegels 3-5x/day with long-hold and pelvic tilt with sneeze, laugh/cough kegel, vaginal weights, mult-direction stepping.        Corrective: Upper cervical rotation Tennis ball release added MET for R anterior for 1 week. Heel-lift for RLE. self external PFM release. Hip IR/ER    Consulted and Agree with Plan of Care Patient             Patient will benefit from skilled therapeutic intervention in order to improve the following deficits and impairments:     Visit Diagnosis: Other muscle spasm  Foot drop, left  Sacrococcygeal disorders, not elsewhere  classified     Problem List Patient Active Problem List   Diagnosis Date Noted   Choroid plexus cyst of fetus on prenatal ultrasound 05/29/2021   History of vacuum extraction assisted delivery 04/03/2021   H/O macrosomia in infant in prior pregnancy, currently pregnant 04/03/2021   AMA (  advanced maternal age) multigravida 41+, first trimester 04/03/2021   Hypermobility arthralgia 02/25/2021   Positive urine pregnancy test 02/25/2021   Chronic pain of right knee 09/17/2020   Urinary incontinence 08/17/2019   History of left foot drop 08/17/2019   Willa Rough DPT, ATC Willa Rough, PT 08/21/2021, 1:22 PM  [place] MAIN [facility] SERVICES [address] [place], [place], [address] Phone: [phone]   Fax:  [phone]  Name: Irelyn Perfecto MRN: [id number] Date of Birth: 1985/12/14

## 2021-08-26 ENCOUNTER — Telehealth: Payer: Self-pay | Admitting: Obstetrics and Gynecology

## 2021-08-26 NOTE — Telephone Encounter (Signed)
Pt called asking to speak with crystal miller to follow up on a billing question that had already been discussed. Please Advise.

## 2021-08-26 NOTE — Telephone Encounter (Signed)
Pt states 03/11/21 visit should be a nurse visit. Reached out to [facility] to see if she can fix for me.  Will update pt ASAp.

## 2021-08-28 ENCOUNTER — Other Ambulatory Visit: Payer: Self-pay

## 2021-08-28 ENCOUNTER — Ambulatory Visit (INDEPENDENT_AMBULATORY_CARE_PROVIDER_SITE_OTHER): Payer: No Typology Code available for payment source | Admitting: Obstetrics and Gynecology

## 2021-08-28 ENCOUNTER — Encounter: Payer: Self-pay | Admitting: Obstetrics and Gynecology

## 2021-08-28 VITALS — BP 97/60 | HR 86 | Wt 141.7 lb

## 2021-08-28 DIAGNOSIS — Z3A33 33 weeks gestation of pregnancy: Secondary | ICD-10-CM

## 2021-08-28 DIAGNOSIS — Z3403 Encounter for supervision of normal first pregnancy, third trimester: Secondary | ICD-10-CM

## 2021-08-28 LAB — POCT URINALYSIS DIPSTICK OB
Bilirubin, UA: NEGATIVE
Blood, UA: NEGATIVE
Glucose, UA: NEGATIVE
Ketones, UA: NEGATIVE
Leukocytes, UA: NEGATIVE
Nitrite, UA: NEGATIVE
POC,PROTEIN,UA: NEGATIVE
Spec Grav, UA: 1.015 (ref 1.010–1.025)
Urobilinogen, UA: 0.2 E.U./dL
pH, UA: 7.5 (ref 5.0–8.0)

## 2021-08-28 NOTE — Progress Notes (Signed)
ROB: She is concerned about a pinkish vaginal discharge. She is feeling great today.

## 2021-08-28 NOTE — Progress Notes (Signed)
ROB: Patient has been intermittently using the pessary.  She is not sure if it is making much of a difference.  Reports daily fetal movement.  Ultrasound for growth scheduled at 36 weeks.  Cultures next visit.

## 2021-09-04 ENCOUNTER — Other Ambulatory Visit: Payer: Self-pay

## 2021-09-04 ENCOUNTER — Ambulatory Visit: Payer: No Typology Code available for payment source

## 2021-09-04 DIAGNOSIS — M62838 Other muscle spasm: Secondary | ICD-10-CM | POA: Diagnosis not present

## 2021-09-04 DIAGNOSIS — M533 Sacrococcygeal disorders, not elsewhere classified: Secondary | ICD-10-CM

## 2021-09-04 DIAGNOSIS — M21372 Foot drop, left foot: Secondary | ICD-10-CM

## 2021-09-04 NOTE — Therapy (Signed)
[place] [facility] [address] [place], [place], [address] Phone: [phone]   Fax:  [phone]  Physical Therapy Treatment  The patient has been informed of current processes in place at Outpatient Rehab to protect patients from Covid-19 exposure including social distancing, schedule modifications, and new cleaning procedures. After discussing their particular risk with a therapist based on the patient's personal risk factors, the patient has decided to proceed with in-person therapy.   Patient Details  Name: Lisa Brady MRN: [id number] Date of Birth: 1986-08-15 No data recorded  Encounter Date: 09/04/2021   PT End of Session - 09/04/21 0801     Visit Number 72    Number of Visits 39    Date for PT Re-Evaluation 52/77/82   recert 03/07/34   Authorization Type [employer]    Authorization - Visit Number 5    Authorization - Number of Visits 10    Progress Note Due on Visit 101    PT Start Time 0800    PT Stop Time 0904    PT Time Calculation (min) 64 min    Activity Tolerance Patient tolerated treatment well;No increased pain    Behavior During Therapy [name] for tasks assessed/performed             Past Medical History:  Diagnosis Date   Dysplastic nevus 04/01/2021   right medial forearm, mod Atypia    Dysplastic nevus 04/01/2021   right inferior axilla, mild atypia    Dysplastic nevus 04/01/2021   right superior medial scapula, mild atypia    Foot drop    Hypermobility arthralgia    Maternal varicella, non-immune 11/20/2018   Scoliosis    Urinary incontinence     Past Surgical History:  Procedure Laterality Date   WISDOM TOOTH EXTRACTION  2006    There were no vitals filed for this visit.   Pelvic Floor Physical Therapy Treatment Note  SCREENING  Changes in medications, allergies, or medical history?: pregnant    SUBJECTIVE  Patient reports: She has had some tension in the R shoulder and  upper back but hips feel pretty good. She wore her pessary for 4 days and it caused some discomfort the first day which eased off. It also creates significant discharge but she does notice more urgency when she does not have it in. Her hips have been feeling good, she has had tension through her R side of her neck and some pain in the thoracic region.  Precautions:  She is [redacted] weeks pregnant,  Hypermobility and Scoliosis  Pain update:  Location of pain:  mid/upper back  (neck) Current pain:  2/10 (0/10 with turning) Max pain:  3/10  (0/10) Least pain:  0/10 (0/10) Nature of pain: tightness/achy  **"much better" in the upper back/shoulder following treatment.  Patient Goals: Control her bladder/not need depends.   OBJECTIVE  Changes in:   Observation:  Slight R anterior innominate rotation (from previous session)  R scapula protracted and winging>L (from prior session)  12/26/20: alignment not assessed before MMT performed, audble "pop" when performing MMT for L adduction and then pelvis assessed and appropriately aligned in supine.  01/09/21: Pt. Demonstrates R anterior rotation, L up-slip and LLD with LLE long was finally detected. **PSIS appear level in standing with 2/3 heel-lift in R shoe.   03/06/21: mild R anterior rotation, resolved following treatment.  03/20/21: L up-slip and R anterior/L posterior rotation, possible R out-flare.  **following treatment pubic symphysis level, up-slip  corrected but R anterion rotation remaining.   04/17/21: R out-flare.  05/08/21: TTP to B IC and coccygeus. Sytrnge ~ 2/5 in standing and 2+/5 in supine at BOS. ~ 3+ following treatment in supine, Pt. Able to sense in standing following treatment.   05/22/21: ASIS and PSIS appeared level in standing but in supine R ASIS low and ankles level (RLE is short-leg). At EOS ASIS level and RLE short (appropriate) in supine.  06/12/21: ASIS and PSIS appear level in standing.  06/26/21: R anterior rotation  and L up-slip.  07/24/21: mild R anterior rotation/ tension into rotation.   08/07/21: ASIS and PSIS level, RLE slightly externally rotated in standing.   08/21/21: R anterior rotation and L up-slip  TODAY: pelvis well aligned, R thoracic curve most evident.  ROM/Mobility:  Decreased scapular depression and downward rotation ROM in R>L scapula. Decreased L cervical rotation with painful end-feel. Decreased mobility at pubic symphysis and pain with pressure at L Pubic ramus  (from prior session)  Pt demonstrates decreased upward rotation ROM for R scapula due to spasm/ fascial tightness that is limiting proprioception and recruitment for effective downward scapular rotation strengthening. (from previous session)  07/24/21: decreased thoracic PIVM  TODAY: decreased R thoracic rotation mobility and decreased PIVM in PA and R to L rotational direction at spinous processes T~2-10  Strength:  Muscle fatigue reached earlier on R>L with tall-kneeling/balance exercises. Imbalance surrounding R scapula not allowing Pt. To perform I's, Y's, T's W's well. (from prior session)  MMT LE  - L DF 4+/5 (different rater may account for difference but could be due to spasms in back since test performed prior to DN/manual) - R DF 5/5 (from prior session)  Strength/MMT:  LE MMT (12/26/20) LE MMT Left Right  Hip flex:  (L2) /5 /5  Hip ext: 5/5 5/5  Hip abd: 4+/5 5/5  Hip add: 4+/5 5/5  Hip IR 4/5 4+/5  Hip ER 5/5 5/5  *audible "pop" with mild discomfort noted with resisted L adduction.  (02-06-21) LE MMT Left Right  Hip flex:  (L2) /5 /5  Hip ext: -5/5 5/5  Hip abd: 5/5 5/5  Hip add: 4+/5 4/5!  Hip IR 4+/5 4+/5  Hip ER 4++/5 4++/5    06/12/21 LE MMT Left Right  Hip flex:  (L2) /5 /5  Hip ext: 5/5 5/5  Hip abd: 5/5 5/5  Hip add: 4+/5 4+/5  Hip IR 4+/5 4+/5  Hip ER 5-/5 5-/5    Deep-core strength/endurance sufficient to allow Pt. To perform toe-taps x5 in a row before rest needed.  As of  05/01/21: Pt. Demonstrated weakness in DF in fatiguing during BLE plantarflexion at 23 repetitions.  Pelvic floor: TTP through R posterior PR/PC and OI as well as 5 o'clock region of posterior fourchette. TTP to to L posterior fourchette at ~ 5 o'clock. Coordinated and able to keep probe in even at ~ 45 degrees. Increased force production when TA and obliques recruited. ~ 3+/5 strength and maximal endurance at ~ 8 seconds.  (from prior session)  1/7:  Strength ~ 2+/5 initially with TTP to anterior and posterior PR/PC as well as decreased fascial mobility along anterior/inferior aspect of bladder.  **following treatment Pt. Able to achieve 3++/5 with decreased recruitment of the posterior PR/PC and IC near coccyx remaining. (from previous session)  01/09/21: TTP to R>L coccygeus, OI, and IC near coccyx. Strength ~ 3/5 and 3 sec. endurance with poor recruitment of posterior PFM.  **following treatment, strength ~  3++/5 with much more squeeze from posterior PFM and ~ 8 sec. Hold.  04/03/21: TTP to B STP and bulbo as well as EUS and decreased mobility through 2nd layer fascia on R>L.  04/17/20: TTP to all except coccygeus on L and to R anterior and posterior PR/PC. 2/5 at BOS, Pt. demonstrated 3+/ 5 strength at EOS.  Palpation:  Greatest tenderness with pressure on spinous processes T5 and L2. (from previous session)  02/06/21: TTP to R TFL, iliopsoas, vastus lateralis and rectus femoris  03/06/21: TTP to R Iliacus, Psoas, TFL, and vastus lateralis.  03/20/21:  TTP to R iliacus L QL, adductor brevis and pectineus  04/17/21: TTP to B diaphragm.  05/22/21: TTP to L QL, R iliacus , TFL and lateral quads  06/12/21: TTP to B adductor magnus/longus  06/26/21: TTP to R OI and Piriformis, B QL.  07/24/21: TTP to R TFL with decreased fascial mobility.  08/07/21: TTP to R adductor brevis, gracilis, vastus intermedius, OI, Piriformis, and TFL  08/21/21: TTP to R>L iliacus and psoas and L>R QL  TODAY: TTP to  multifidus through ~ T4-8 on the R.  Gait Analysis: (from prior session) Gait speed: 1.19ms ; anterior pelvic tilt, knee hyperextension, heel strike - with cues pelvic neutral achieved, with appropriate arm swing and decreased heel strike.     INTERVENTIONS THIS SESSION:   Manual: Performed TP release to multifidus through ~ T4-8 on the R. followed by grade 2-3 PA mobs and MWM into R rotation to decrease spasm and pain, improve thoracic mobility, and allow for improved ROM and balance of musculature for improved function and decreased symptoms.  Therex: reviewed squat form and exhale on exertion, instructed Pt. To prioritize walking, squats, TA activation/cat-cow and scoliosis exercises leeading up to delivery.  Total time:  688m.                                PT Short Term Goals - 05/01/21 1313       PT SHORT TERM GOAL #1   Title Patient will demonstrate improved pelvic alignment and balance of musculature surrounding the pelvis to facilitate decreased PFM spasms and decrease pressure on nerve roots to allow for optimal PFM function    Baseline Has L foot-drop and pain in L glutes/knee as well as inability to sense PFM and L posterior innominate rotation/spasms surrounding pelvis.    Time 5    Period Weeks    Status Achieved    Target Date 11/26/19      PT SHORT TERM GOAL #2   Title Patient will demonstrate HEP x1 in the clinic to demonstrate understanding and proper form to allow for further improvement.    Baseline Pt. lacks knowledge of therapeutic ecersise that will decrease Sx.    Time 5    Period Weeks    Status Achieved    Target Date 11/26/19      PT SHORT TERM GOAL #3   Title Pt. will be able to walk 250 Ft.with CGA and w/o assistive device to demonstrate improved balance/decreased instability and foot drop.    Baseline Pt. using RW to AMB due to L foot-drop and increased instability.    Time 5    Period Weeks    Status Achieved     Target Date 08/20/19      PT SHORT TERM GOAL #4   Title Patient will demonstrate a coordinated contraction, relaxation, and bulge  of the pelvic floor muscles to demonstrate functional recruitment and motion and allow for further strengthening.    Baseline Pt. reports inability to sense the PFM for kegel or relaxation, having mixed UI    Time 5    Period Weeks    Status Achieved    Target Date 08/20/19      PT SHORT TERM GOAL #5   Title Pt. will be able to demonstrate tall/half kneeling exercises for 10-15 seconds without increased pelvic pressure/UI sensation.    Baseline Pt. has avoided half-kneeling exercises because they increased Sx.    Time 6    Period Weeks    Status New    Target Date 06/12/21               PT Long Term Goals - 07/10/21 0855       PT LONG TERM GOAL #1   Title Patient will report no episodes of UUI/SUI over the course of the prior two weeks to demonstrate improved functional ability.    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 As of 4/30: She had been doing better but then took a step backward when she was sick and vomiting a lot. As of 7/9: Pt. continues to have decreased incontinence but had an episode of total bladder loss and UUI ~ 1-2 times/day still (worsened due to overuse/spasm of LB musculature. As of 11/12: She had one "big accident" and one "snexe-pee" accident over the week. As of 05/01/21: Pt. has had an increase in Sx. since becoming pregnant and has been fit for a pessary which she will begin using soon to prevent further UI worsening over the course of her pregnancy along with further PFM strengthening.  As of 07/10/21: no UUI and able to make it to the toilet before leakage, SUI with coughing occurred lately with colds    Time 10    Period Weeks    Status On-going      PT LONG TERM GOAL #2   Title Patient will report no pain with intercourse to  demonstrate improved functional ability.    Baseline having mild pain with initial penetration As of 1:29: had some pain at initial penetration and with deeper thrusting at ~ 3-4/10 but "not terrible" 10/17/20: has not attempted to know As of 05/01/21: Pt. had some radiating pain with pessary fitting even following release the prior Friday, reports "not as bad as it could've been".  07/10/21: no pain    Time 10    Period Weeks    Status Achieved      PT LONG TERM GOAL #3   Title Patient will score at or below 22/100 on the UDI and 27/100 on the UIQ to demonstrate a clinically meaningful decrease in disability and distress due to pelvic floor dysfunction.    Baseline UDI: 67, UIQ: 72/100 10-19-19: UDI 55.5, UIQ 76/100; POPDI 11/100 As of 1/29: UDI:33/100, UIQ: 6/100, POPDI:6/100. As of 4/30: Due to set-back from vomiting. As of 11/12: UDI: 17/100, UIQ: POPDI: 11/100,    Time 10    Period Weeks    Status Achieved      PT LONG TERM GOAL #4   Title Pt. 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    Baseline Pt. requiring RW for AMB due  to L foot-drop and imbalance. As of 10/12: Pt. requiring some support (e.g. grocery cart) for > 20 min. of AMB or stairs.    Time 10    Period Weeks    Status Achieved      PT LONG TERM GOAL #5   Title Pt. will improve in FOTO score by 10 points to demonstrate improved function.    Baseline FOTO PFDI Urinary: 29, Urinary Problem:54    Time 10    Period Weeks    Status On-going      Additional Long Term Goals   Additional Long Term Goals Yes      PT LONG TERM GOAL #6   Title Patient will demonstrate 4+/5 strength or better in the PF and surrounding musculature to show improved functional strength for childcare and other vocational and recreational activities.    Baseline PFM 3/5, As of 04/17/21: 3+/5.  07/10/21: will reassess next session    Time 10     Period Weeks    Status On-going      PT LONG TERM GOAL #7   Title Pt will demo no R shoulder IR, levelled shoulder height, and no anterior rotation of R PSIS across 2 visits to demo IND with scoliosis HEP and pelvic stability.    Time 10    Period Weeks    Status New    Target Date 09/18/21                   Plan - 09/04/21 1258     Clinical Impression Statement Pt. Responded well to all interventions today, demonstrating improved thoracic mobility, decreased spasm and pain through the upper back, as well as understanding and correct performance of all education and exercises provided today. They will continue to benefit from skilled physical therapy to work toward remaining goals and maximize function as well as decrease likelihood of symptom increase or recurrence.    PT Next Visit Plan re-assess internally PRN. Discuss laboring positions/plan, re-assess pelvic alignment, Review toe-taps, TP repease to R posterior scapular stabilizers and spinal mobilization/ rotational mobs into R rotation and perform PNF for the scapula and rainbows (side-lying ABD) on the R review hip IR/ER strengthening.    PT Home Exercise Plan day: Teapot, Side-planks, Planks, rows, scap retractions, (blue band). Mini-Marches with leg lift,  standing hip ABD and EXT, modified bridge, tall kneeling. lean-backs, calf raises.     B day: Child's pose V-sit/ single leg hamstring stretch,. Side stretch, Piriformis stretch, Door stretch,  stretch levator scap (neck) on R    Every day: Kegels 3-5x/day with long-hold and pelvic tilt with sneeze, laugh/cough kegel, vaginal weights, mult-direction stepping.        Corrective: Upper cervical rotation Tennis ball release added MET for R anterior for 1 week. Heel-lift for RLE. self external PFM release. Hip IR/ER (new priority 09/04/21, walking, squats, cat/cow and scoliosis specific exercises)    Consulted and Agree with Plan of Care Patient             Patient will  benefit from skilled therapeutic intervention in order to improve the following deficits and impairments:     Visit Diagnosis: Other muscle spasm  Foot drop, left  Sacrococcygeal disorders, not elsewhere classified     Problem List Patient Active Problem List   Diagnosis Date Noted   Choroid plexus cyst of fetus on prenatal ultrasound 05/29/2021   History of vacuum extraction assisted delivery 04/03/2021   H/O macrosomia in infant in prior  pregnancy, currently pregnant 04/03/2021   AMA (advanced maternal age) multigravida 40+, first trimester 04/03/2021   Hypermobility arthralgia 02/25/2021   Positive urine pregnancy test 02/25/2021   Chronic pain of right knee 09/17/2020   Urinary incontinence 08/17/2019   History of left foot drop 08/17/2019   Willa Rough DPT, ATC Willa Rough, PT 09/04/2021, 1:01 PM  [place] [facility] [address] [place], [place], [address] Phone: [phone]   Fax:  [phone]  Name: Lisa Brady MRN: [id number] Date of Birth: [birth year]

## 2021-09-04 NOTE — Patient Instructions (Signed)
  Walking 20-30 min.  50 squats  Cat/cow 2x15 2-3 times per week.  Scoliosis exercises 3-5 times per week.

## 2021-09-09 ENCOUNTER — Encounter: Payer: No Typology Code available for payment source | Admitting: Obstetrics and Gynecology

## 2021-09-11 NOTE — Telephone Encounter (Signed)
4/6 visit has been corrected. Pt aware.

## 2021-09-16 ENCOUNTER — Other Ambulatory Visit: Payer: No Typology Code available for payment source

## 2021-09-18 ENCOUNTER — Other Ambulatory Visit: Payer: Self-pay

## 2021-09-18 ENCOUNTER — Ambulatory Visit (INDEPENDENT_AMBULATORY_CARE_PROVIDER_SITE_OTHER): Payer: No Typology Code available for payment source | Admitting: Obstetrics and Gynecology

## 2021-09-18 ENCOUNTER — Ambulatory Visit: Payer: No Typology Code available for payment source

## 2021-09-18 ENCOUNTER — Encounter: Payer: Self-pay | Admitting: Obstetrics and Gynecology

## 2021-09-18 ENCOUNTER — Ambulatory Visit: Payer: No Typology Code available for payment source | Attending: Obstetrics and Gynecology

## 2021-09-18 VITALS — BP 110/62 | HR 93 | Wt 144.8 lb

## 2021-09-18 DIAGNOSIS — Z3483 Encounter for supervision of other normal pregnancy, third trimester: Secondary | ICD-10-CM

## 2021-09-18 DIAGNOSIS — M533 Sacrococcygeal disorders, not elsewhere classified: Secondary | ICD-10-CM | POA: Diagnosis present

## 2021-09-18 DIAGNOSIS — Z3A36 36 weeks gestation of pregnancy: Secondary | ICD-10-CM

## 2021-09-18 DIAGNOSIS — M21372 Foot drop, left foot: Secondary | ICD-10-CM | POA: Diagnosis present

## 2021-09-18 DIAGNOSIS — M62838 Other muscle spasm: Secondary | ICD-10-CM | POA: Diagnosis not present

## 2021-09-18 LAB — POCT URINALYSIS DIPSTICK OB
Bilirubin, UA: NEGATIVE
Blood, UA: NEGATIVE
Glucose, UA: NEGATIVE
Ketones, UA: NEGATIVE
Leukocytes, UA: NEGATIVE
Nitrite, UA: NEGATIVE
POC,PROTEIN,UA: NEGATIVE
Spec Grav, UA: 1.01 (ref 1.010–1.025)
Urobilinogen, UA: 0.2 E.U./dL
pH, UA: 6.5 (ref 5.0–8.0)

## 2021-09-18 NOTE — Patient Instructions (Signed)
 "  The Jiggle": Place an open hand lightly on the thigh/glute etc. And at a moderate rhythm, do small "jiggles" to the skin layer of tissue in the direction of least resistance. Do this for ~ 10-20 min. As needed to help calm the nervous system, loosen tension in the fascia, and decrease pain.

## 2021-09-18 NOTE — Therapy (Signed)
[place] MAIN [name] SERVICES [address] [place], [place], [address] Phone: [phone]   Fax:  [phone]  Physical Therapy Treatment  The patient has been informed of current processes in place at Outpatient Rehab to protect patients from Covid-19 exposure including social distancing, schedule modifications, and new cleaning procedures. After discussing their particular risk with a therapist based on the patient's personal risk factors, the patient has decided to proceed with in-person therapy.  Patient Details  Name: Lisa Brady MRN: [id number] Date of Birth: 1986/04/18 No data recorded  Encounter Date: 09/18/2021   PT End of Session - 09/18/21 0859     Visit Number 33    Number of Visits 11    Date for PT Re-Evaluation 98/92/11   recert 08/09/16   Authorization Type [place]    Authorization - Visit Number 6    Authorization - Number of Visits 10    Progress Note Due on Visit 46    PT Start Time 0800    PT Stop Time 0900    PT Time Calculation (min) 60 min    Activity Tolerance Patient tolerated treatment well;No increased pain    Behavior During Therapy [facility] for tasks assessed/performed             Past Medical History:  Diagnosis Date   Dysplastic nevus 04/01/2021   right medial forearm, mod Atypia    Dysplastic nevus 04/01/2021   right inferior axilla, mild atypia    Dysplastic nevus 04/01/2021   right superior medial scapula, mild atypia    Foot drop    Hypermobility arthralgia    Maternal varicella, non-immune 11/20/2018   Scoliosis    Urinary incontinence     Past Surgical History:  Procedure Laterality Date   WISDOM TOOTH EXTRACTION  2006    There were no vitals filed for this visit.  Pelvic Floor Physical Therapy Treatment Note  SCREENING  Changes in medications, allergies, or medical history?: pregnant    SUBJECTIVE  Patient reports: She has not had a lot of urinary sx. Had some mild SUI  with the pessary in during cough/sneeze and then she was having more urgency so she took it out and noticed that the knob was posterior instead of anterior. She is going ~ 3.5 hours throughout the day. Knows she is not drinking enough water in the mornings. On Wednesday and maybe a little on Sunday she would stand up and get a little back pain that shot down into her legs that would ease off when she walked around a little bit. She had some pubic symphysis pressure this morning walking in. She was also constipated last night.   Precautions:  She is [redacted] weeks pregnant,  Hypermobility and Scoliosis  Pain update:  Location of pain:  mid/upper back  (neck) Current pain:  2/10 (0/10 with turning) Max pain:  3/10  (0/10) Least pain:  0/10 (0/10) Nature of pain: tightness/achy  **no increased pain following treatment  Patient Goals: Control her bladder/not need depends.   OBJECTIVE  Changes in:   Observation:  Slight R anterior innominate rotation (from previous session)  R scapula protracted and winging>L (from prior session)  12/26/20: alignment not assessed before MMT performed, audble "pop" when performing MMT for L adduction and then pelvis assessed and appropriately aligned in supine.  01/09/21: Pt. Demonstrates R anterior rotation, L up-slip and LLD with LLE long was finally detected. **PSIS appear level in standing with 2/3 heel-lift in R  shoe.   03/06/21: mild R anterior rotation, resolved following treatment.  03/20/21: L up-slip and R anterior/L posterior rotation, possible R out-flare.  **following treatment pubic symphysis level, up-slip corrected but R anterion rotation remaining.   04/17/21: R out-flare.  05/08/21: TTP to B IC and coccygeus. Sytrnge ~ 2/5 in standing and 2+/5 in supine at BOS. ~ 3+ following treatment in supine, Pt. Able to sense in standing following treatment.   05/22/21: ASIS and PSIS appeared level in standing but in supine R ASIS low and ankles level (RLE  is short-leg). At EOS ASIS level and RLE short (appropriate) in supine.  06/12/21: ASIS and PSIS appear level in standing.  06/26/21: R anterior rotation and L up-slip.  07/24/21: mild R anterior rotation/ tension into rotation.   08/07/21: ASIS and PSIS level, RLE slightly externally rotated in standing.   08/21/21: R anterior rotation and L up-slip  09/04/21: pelvis well aligned, R thoracic curve most evident.  TODAY: pelvis well aligned, R thoracic curve most evident.  ROM/Mobility:  Decreased scapular depression and downward rotation ROM in R>L scapula. Decreased L cervical rotation with painful end-feel. Decreased mobility at pubic symphysis and pain with pressure at L Pubic ramus  (from prior session)  Pt demonstrates decreased upward rotation ROM for R scapula due to spasm/ fascial tightness that is limiting proprioception and recruitment for effective downward scapular rotation strengthening. (from previous session)  07/24/21: decreased thoracic PIVM  09/04/32: decreased R thoracic rotation mobility and decreased PIVM in PA and R to L rotational direction at spinous processes T~2-10  TODAY: decreased PA PIVM through T/L junction and slightly through upper thoracic spine.  Strength:  Muscle fatigue reached earlier on R>L with tall-kneeling/balance exercises. Imbalance surrounding R scapula not allowing Pt. To perform I's, Y's, T's W's well. (from prior session)  MMT LE  - L DF 4+/5 (different rater may account for difference but could be due to spasms in back since test performed prior to DN/manual) - R DF 5/5 (from prior session)  Strength/MMT:  LE MMT (12/26/20) LE MMT Left Right  Hip flex:  (L2) /5 /5  Hip ext: 5/5 5/5  Hip abd: 4+/5 5/5  Hip add: 4+/5 5/5  Hip IR 4/5 4+/5  Hip ER 5/5 5/5  *audible "pop" with mild discomfort noted with resisted L adduction.  (02-06-21) LE MMT Left Right  Hip flex:  (L2) /5 /5  Hip ext: -5/5 5/5  Hip abd: 5/5 5/5  Hip add: 4+/5 4/5!   Hip IR 4+/5 4+/5  Hip ER 4++/5 4++/5    06/12/21 LE MMT Left Right  Hip flex:  (L2) /5 /5  Hip ext: 5/5 5/5  Hip abd: 5/5 5/5  Hip add: 4+/5 4+/5  Hip IR 4+/5 4+/5  Hip ER 5-/5 5-/5    Deep-core strength/endurance sufficient to allow Pt. To perform toe-taps x5 in a row before rest needed.  As of 05/01/21: Pt. Demonstrated weakness in DF in fatiguing during BLE plantarflexion at 23 repetitions.  Pelvic floor: TTP through R posterior PR/PC and OI as well as 5 o'clock region of posterior fourchette. TTP to to L posterior fourchette at ~ 5 o'clock. Coordinated and able to keep probe in even at ~ 45 degrees. Increased force production when TA and obliques recruited. ~ 3+/5 strength and maximal endurance at ~ 8 seconds.  (from prior session)  1/7:  Strength ~ 2+/5 initially with TTP to anterior and posterior PR/PC as well as decreased fascial mobility along anterior/inferior  aspect of bladder.  **following treatment Pt. Able to achieve 3++/5 with decreased recruitment of the posterior PR/PC and IC near coccyx remaining. (from previous session)  01/09/21: TTP to R>L coccygeus, OI, and IC near coccyx. Strength ~ 3/5 and 3 sec. endurance with poor recruitment of posterior PFM.  **following treatment, strength ~ 3++/5 with much more squeeze from posterior PFM and ~ 8 sec. Hold.  04/03/21: TTP to B STP and bulbo as well as EUS and decreased mobility through 2nd layer fascia on R>L.  04/17/20: TTP to all except coccygeus on L and to R anterior and posterior PR/PC. 2/5 at BOS, Pt. demonstrated 3+/ 5 strength at EOS.  Palpation:  Greatest tenderness with pressure on spinous processes T5 and L2. (from previous session)  02/06/21: TTP to R TFL, iliopsoas, vastus lateralis and rectus femoris  03/06/21: TTP to R Iliacus, Psoas, TFL, and vastus lateralis.  03/20/21:  TTP to R iliacus L QL, adductor brevis and pectineus  04/17/21: TTP to B diaphragm.  05/22/21: TTP to L QL, R iliacus , TFL and lateral  quads  06/12/21: TTP to B adductor magnus/longus  06/26/21: TTP to R OI and Piriformis, B QL.  07/24/21: TTP to R TFL with decreased fascial mobility.  08/07/21: TTP to R adductor brevis, gracilis, vastus intermedius, OI, Piriformis, and TFL  08/21/21: TTP to R>L iliacus and psoas and L>R QL  09/04/32: TTP to multifidus through ~ T4-8 on the R.  TODAY: TTP to B coccygeus (palpated externally) and through multifidus near ~ T3-5 on L>R  Gait Analysis: (from prior session) Gait speed: 1.67ms ; anterior pelvic tilt, knee hyperextension, heel strike - with cues pelvic neutral achieved, with appropriate arm swing and decreased heel strike.     INTERVENTIONS THIS SESSION:   Manual: Performed TP release to B coccygeus externally and multifidus through ~ T3-5 on the L. followed by grade 2-3 PA mobs through T3-5 and T12-L2 to decrease spasm and pain, improve thoracic mobility, and allow for improved ROM and balance of musculature for improved function and decreased symptoms.  Self-care: Educated on and demonstrated jiggling to help decrease tension in the body and prepare nervous system for a vaginal delivery. Discussed length of pushing time associate with higher risk to set appropriate expectations and not encourage unnecessary c-section.  Total time:  634m.                                 PT Short Term Goals - 09/18/21 0901       PT SHORT TERM GOAL #1   Title Patient will demonstrate improved pelvic alignment and balance of musculature surrounding the pelvis to facilitate decreased PFM spasms and decrease pressure on nerve roots to allow for optimal PFM function    Baseline Has L foot-drop and pain in L glutes/knee as well as inability to sense PFM and L posterior innominate rotation/spasms surrounding pelvis.    Time 5    Period Weeks    Status Achieved    Target Date 11/26/19      PT SHORT TERM GOAL #2   Title Patient will demonstrate HEP x1 in the clinic  to demonstrate understanding and proper form to allow for further improvement.    Baseline Pt. lacks knowledge of therapeutic ecersise that will decrease Sx.    Time 5    Period Weeks    Status Achieved    Target Date 11/26/19  PT SHORT TERM GOAL #3   Title Pt. will be able to walk 250 Ft.with CGA and w/o assistive device to demonstrate improved balance/decreased instability and foot drop.    Baseline Pt. using RW to AMB due to L foot-drop and increased instability.    Time 5    Period Weeks    Status Achieved    Target Date 08/20/19      PT SHORT TERM GOAL #4   Title Patient will demonstrate a coordinated contraction, relaxation, and bulge of the pelvic floor muscles to demonstrate functional recruitment and motion and allow for further strengthening.    Baseline Pt. reports inability to sense the PFM for kegel or relaxation, having mixed UI    Time 5    Period Weeks    Status Achieved    Target Date 08/20/19      PT SHORT TERM GOAL #5   Title Pt. will be able to demonstrate tall/half kneeling exercises for 10-15 seconds without increased pelvic pressure/UI sensation.    Baseline Pt. has avoided half-kneeling exercises because they increased Sx.    Time 6    Period Weeks    Status On-going    Target Date 10/30/21               PT Long Term Goals - 09/18/21 0901       PT LONG TERM GOAL #1   Title Patient will report no episodes of UUI/SUI over the course of the prior two weeks to demonstrate improved functional ability.    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 As of 4/30: She had been doing better but then took a step backward when she was sick and vomiting a lot. As of 7/9: Pt. continues to have decreased incontinence but had an episode of total bladder loss and UUI ~ 1-2 times/day still (worsened due to overuse/spasm of LB musculature. As of 11/12: She had one "big  accident" and one "snexe-pee" accident over the week. As of 05/01/21: Pt. has had an increase in Sx. since becoming pregnant and has been fit for a pessary which she will begin using soon to prevent further UI worsening over the course of her pregnancy along with further PFM strengthening.  As of 07/10/21: no UUI and able to make it to the toilet before leakage, SUI with coughing occurred lately with colds    Time 10    Period Weeks    Status On-going    Target Date 10/30/21      PT LONG TERM GOAL #2   Title Patient will report no pain with intercourse to demonstrate improved functional ability.    Baseline having mild pain with initial penetration As of 1:29: had some pain at initial penetration and with deeper thrusting at ~ 3-4/10 but "not terrible" 10/17/20: has not attempted to know As of 05/01/21: Pt. had some radiating pain with pessary fitting even following release the prior Friday, reports "not as bad as it could've been".  07/10/21: no pain    Time 10    Period Weeks    Status Achieved    Target Date 07/10/21      PT LONG TERM GOAL #3   Title Patient will score at or below 22/100 on the UDI and 27/100 on the UIQ to demonstrate a clinically meaningful decrease in disability and distress due to pelvic floor dysfunction.  Baseline UDI: 67, UIQ: 72/100 10-19-19: UDI 55.5, UIQ 76/100; POPDI 11/100 As of 1/29: UDI:33/100, UIQ: 6/100, POPDI:6/100. As of 4/30: Due to set-back from vomiting. As of 11/12: UDI: 17/100, UIQ: POPDI: 11/100,    Time 10    Period Weeks    Status Achieved    Target Date 08/22/20      PT LONG TERM GOAL #4   Title Pt. 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    Baseline Pt. requiring RW for AMB due to L foot-drop and imbalance. As of 10/12: Pt. requiring some support (e.g. grocery cart) for > 20 min. of AMB or stairs.    Time 10    Period Weeks    Status  Achieved    Target Date 11/25/20      PT LONG TERM GOAL #5   Title Pt. will improve in FOTO score by 10 points to demonstrate improved function.    Baseline FOTO PFDI Urinary: 29, Urinary Problem:54    Time 10    Period Weeks    Status On-going    Target Date 10/30/21      PT LONG TERM GOAL #6   Title Patient will demonstrate 4+/5 strength or better in the PF and surrounding musculature to show improved functional strength for childcare and other vocational and recreational activities.    Baseline PFM 3/5, As of 04/17/21: 3+/5.  07/10/21: will reassess next session    Time 10    Period Weeks    Status On-going    Target Date 10/30/21      PT LONG TERM GOAL #7   Title Pt will demo no R shoulder IR, levelled shoulder height, and no anterior rotation of R PSIS across 2 visits to demo IND with scoliosis HEP and pelvic stability.    Time 10    Period Weeks    Status On-going    Target Date 10/30/21                   Plan - 09/18/21 0900     Clinical Impression Statement Pt. is demonstrating decreased UI and pain when not using the pessary but some increased UUI when wearing the pessary (though she had it in backward per Pt. report). This in combination with the fact that her pelvic alignment has remained good over 1 month are signs that she is becoming more stable and the strengthening is having the desired effect. She is, however, continuing to have mild pain and discomforth through the upper back and T/L junction which can lead to increased nerve compression and a return of Sx. if not managed well leading up to delivery. We will re-assess the pelvic floor muscles at her next appointment to make sure that she is ready for labor and see if the increased UUI is a result of PFM spasms that were irritated from the pessary. She will continue to benefit from weekly PT for the next 6 weeks to help maintain spinal alignment, improve posture, and help decrease risk of re-injury to her nerves  during her upcoming delivery.    Personal Factors and Comorbidities Comorbidity 2    Comorbidities scoliosis, hypermobility    Examination-Activity Limitations Locomotion Level;Stand;Stairs;Lift;Squat;Toileting;Continence;Sit;Transfers;Caring for Others;Bend    Examination-Participation Restrictions Interpersonal Relationship;Yard Work;Community Activity;Meal Prep;Laundry    Stability/Clinical Decision Making Evolving/Moderate complexity    Clinical Decision Making Moderate    Rehab Potential  Good    PT Frequency 1x / week    PT Duration 6 weeks    PT Treatment/Interventions ADLs/Self Care Home Management;Aquatic Therapy;Biofeedback;Electrical Stimulation;Gait training;Stair training;Balance training;Therapeutic exercise;Therapeutic activities;Functional mobility training;Neuromuscular re-education;Patient/family education;Manual techniques;Scar mobilization;Dry needling;Taping;Spinal Manipulations;Joint Manipulations    PT Next Visit Plan re-assess internally PRN. Discuss laboring positions/plan, re-assess pelvic alignment, Review toe-taps, TP repease to R posterior scapular stabilizers and spinal mobilization/ rotational mobs into R rotation and perform PNF for the scapula and rainbows (side-lying ABD) on the R review hip IR/ER strengthening.    PT Home Exercise Plan day: Teapot, Side-planks, Planks, rows, scap retractions, (blue band). Mini-Marches with leg lift,  standing hip ABD and EXT, modified bridge, tall kneeling. lean-backs, calf raises.     B day: Child's pose V-sit/ single leg hamstring stretch,. Side stretch, Piriformis stretch, Door stretch,  stretch levator scap (neck) on R    Every day: Kegels 3-5x/day with long-hold and pelvic tilt with sneeze, laugh/cough kegel, vaginal weights, mult-direction stepping.        Corrective: Upper cervical rotation Tennis ball release added MET for R anterior for 1 week. Heel-lift for RLE. self external PFM release. Hip IR/ER (new priority 09/04/21,  walking, squats, cat/cow and scoliosis specific exercises)    Consulted and Agree with Plan of Care Patient             Patient will benefit from skilled therapeutic intervention in order to improve the following deficits and impairments:  Abnormal gait, Decreased balance, Decreased endurance, Difficulty walking, Increased muscle spasms, Impaired tone, Improper body mechanics, Decreased activity tolerance, Decreased coordination, Decreased strength, Hypermobility, Postural dysfunction, Pain  Visit Diagnosis: Other muscle spasm  Foot drop, left  Sacrococcygeal disorders, not elsewhere classified     Problem List Patient Active Problem List   Diagnosis Date Noted   Choroid plexus cyst of fetus on prenatal ultrasound 05/29/2021   History of vacuum extraction assisted delivery 04/03/2021   H/O macrosomia in infant in prior pregnancy, currently pregnant 04/03/2021   AMA (advanced maternal age) multigravida 35+, first trimester 04/03/2021   Hypermobility arthralgia 02/25/2021   Positive urine pregnancy test 02/25/2021   Chronic pain of right knee 09/17/2020   Urinary incontinence 08/17/2019   History of left foot drop 08/17/2019   Willa Rough DPT, ATC Willa Rough, PT 09/18/2021, 4:47 PM  [place] MAIN [facility] SERVICES [address] [place], [place], [address] Phone: [phone]   Fax:  [phone]  Name: Lisa Brady MRN: [id number] Date of Birth: [birth year]

## 2021-09-18 NOTE — Progress Notes (Signed)
ROB: She is doing well, no concerns. UTD on flu vaccine.

## 2021-09-18 NOTE — Addendum Note (Signed)
Addended by: Letitia Libra T on: 09/18/2021 05:25 PM   Modules accepted: Orders

## 2021-09-19 ENCOUNTER — Encounter: Payer: Self-pay | Admitting: Obstetrics and Gynecology

## 2021-09-19 NOTE — Progress Notes (Signed)
ROB: Patient reports intermittent episodes of leaking urine with coughing or sneezing. Reports she'll have several good days, but then after 2 or 3 days she'll notice it. Reports when this happens and she removes the pessary for cleaning, she notices that the knob has rotated towards the back of her vagina. Discussed checking pessary daily to readjust, but can still remove to clean  On usual schedule.   Was supposed to have growth scan for h/o macrosomia today, however ultrasound rescheduled for next week. 36 week cultures done today. Discussed contraception, patient debating between husband having vasectomy and having one more child. If they plan for one more, will consider OCPs. Desires cervical exam today. RTC in 1 week.

## 2021-09-20 LAB — STREP GP B NAA: Strep Gp B NAA: NEGATIVE

## 2021-09-21 ENCOUNTER — Telehealth: Payer: Self-pay | Admitting: Obstetrics and Gynecology

## 2021-09-21 NOTE — Telephone Encounter (Signed)
Second set of FMLA received in Fax 09-21-2021. Pt paid fee on 09-18-2021 and FMLA given to CM who assigned to [place], I have placed this fax with post-it with this information in Dr.Cherry's Box for FH.

## 2021-09-23 LAB — GC/CHLAMYDIA PROBE AMP
Chlamydia trachomatis, NAA: NEGATIVE
Neisseria Gonorrhoeae by PCR: NEGATIVE

## 2021-09-24 NOTE — Patient Instructions (Signed)
Common Medications Safe in Pregnancy  Acne:      Constipation:  Benzoyl Peroxide     Colace  Clindamycin      Dulcolax Suppository  Topica Erythromycin     Fibercon  Salicylic Acid      Metamucil         Miralax AVOID:        Senakot   Accutane    Cough:  Retin-A       Cough Drops  Tetracycline      Phenergan w/ Codeine if Rx  Minocycline      Robitussin (Plain & DM)  Antibiotics:     Crabs/Lice:  Ceclor       RID  Cephalosporins    AVOID:  E-Mycins      Kwell  Keflex  Macrobid/Macrodantin   Diarrhea:  Penicillin      Kao-Pectate  Zithromax      Imodium AD         PUSH FLUIDS AVOID:       Cipro     Fever:  Tetracycline      Tylenol (Regular or Extra  Minocycline       Strength)  Levaquin      Extra Strength-Do not          Exceed 8 tabs/24 hrs Caffeine:        <200mg/day (equiv. To 1 cup of coffee or  approx. 3 12 oz sodas)         Gas: Cold/Hayfever:       Gas-X  Benadryl      Mylicon  Claritin       Phazyme  **Claritin-D        Chlor-Trimeton    Headaches:  Dimetapp      ASA-Free Excedrin  Drixoral-Non-Drowsy     Cold Compress  Mucinex (Guaifenasin)     Tylenol (Regular or Extra  Sudafed/Sudafed-12 Hour     Strength)  **Sudafed PE Pseudoephedrine   Tylenol Cold & Sinus     Vicks Vapor Rub  Zyrtec  **AVOID if Problems With Blood Pressure         Heartburn: Avoid lying down for at least 1 hour after meals  Aciphex      Maalox     Rash:  Milk of Magnesia     Benadryl    Mylanta       1% Hydrocortisone Cream  Pepcid  Pepcid Complete   Sleep Aids:  Prevacid      Ambien   Prilosec       Benadryl  Rolaids       Chamomile Tea  Tums (Limit 4/day)     Unisom         Tylenol PM         Warm milk-add vanilla or  Hemorrhoids:       Sugar for taste  Anusol/Anusol H.C.  (RX: Analapram 2.5%)  Sugar Substitutes:  Hydrocortisone OTC     Ok in moderation  Preparation H      Tucks        Vaseline lotion applied to tissue with  wiping    Herpes:     Throat:  Acyclovir      Oragel  Famvir  Valtrex     Vaccines:         Flu Shot Leg Cramps:       *Gardasil  Benadryl      Hepatitis A         Hepatitis B Nasal Spray:         Pneumovax  Saline Nasal Spray     Polio Booster         Tetanus Nausea:       Tuberculosis test or PPD  Vitamin B6 25 mg TID   AVOID:    Dramamine      *Gardasil  Emetrol       Live Poliovirus  Ginger Root 250 mg QID    MMR (measles, mumps &  High Complex Carbs @ Bedtime    rebella)  Sea Bands-Accupressure    Varicella (Chickenpox)  Unisom 1/2 tab TID     *No known complications           If received before Pain:         Known pregnancy;   Darvocet       Resume series after  Lortab        Delivery  Percocet    Yeast:   Tramadol      Femstat  Tylenol 3      Gyne-lotrimin  Ultram       Monistat  Vicodin           MISC:         All Sunscreens           Hair Coloring/highlights          Insect Repellant's          (Including DEET)         Mystic Tans  

## 2021-09-25 ENCOUNTER — Ambulatory Visit (INDEPENDENT_AMBULATORY_CARE_PROVIDER_SITE_OTHER): Payer: No Typology Code available for payment source | Admitting: Obstetrics and Gynecology

## 2021-09-25 ENCOUNTER — Encounter: Payer: Self-pay | Admitting: Obstetrics and Gynecology

## 2021-09-25 ENCOUNTER — Ambulatory Visit (INDEPENDENT_AMBULATORY_CARE_PROVIDER_SITE_OTHER): Payer: No Typology Code available for payment source

## 2021-09-25 ENCOUNTER — Other Ambulatory Visit: Payer: Self-pay

## 2021-09-25 DIAGNOSIS — Z3483 Encounter for supervision of other normal pregnancy, third trimester: Secondary | ICD-10-CM

## 2021-09-25 DIAGNOSIS — Z3A37 37 weeks gestation of pregnancy: Secondary | ICD-10-CM

## 2021-09-25 DIAGNOSIS — O09299 Supervision of pregnancy with other poor reproductive or obstetric history, unspecified trimester: Secondary | ICD-10-CM

## 2021-09-25 LAB — POCT URINALYSIS DIPSTICK OB
Bilirubin, UA: NEGATIVE
Blood, UA: NEGATIVE
Glucose, UA: NEGATIVE
Ketones, UA: NEGATIVE
Leukocytes, UA: NEGATIVE
Nitrite, UA: NEGATIVE
POC,PROTEIN,UA: NEGATIVE
Spec Grav, UA: 1.01 (ref 1.010–1.025)
Urobilinogen, UA: 0.2 E.U./dL
pH, UA: 7 (ref 5.0–8.0)

## 2021-09-25 NOTE — Progress Notes (Signed)
OB-Pt present for routine prenatal care and ultrasound. Pt stated having sharp pain in the vaginal area.

## 2021-09-25 NOTE — Progress Notes (Signed)
ROB: No complaints.  Ultrasound today reviewed- 67th percentile.  (Patient happy with this number.)

## 2021-09-30 ENCOUNTER — Other Ambulatory Visit: Payer: Self-pay

## 2021-09-30 ENCOUNTER — Encounter: Payer: Self-pay | Admitting: Obstetrics and Gynecology

## 2021-09-30 ENCOUNTER — Ambulatory Visit (INDEPENDENT_AMBULATORY_CARE_PROVIDER_SITE_OTHER): Payer: No Typology Code available for payment source | Admitting: Obstetrics and Gynecology

## 2021-09-30 VITALS — BP 114/60 | HR 84 | Wt 146.8 lb

## 2021-09-30 DIAGNOSIS — Z3A38 38 weeks gestation of pregnancy: Secondary | ICD-10-CM

## 2021-09-30 DIAGNOSIS — O09523 Supervision of elderly multigravida, third trimester: Secondary | ICD-10-CM

## 2021-09-30 DIAGNOSIS — O09299 Supervision of pregnancy with other poor reproductive or obstetric history, unspecified trimester: Secondary | ICD-10-CM

## 2021-09-30 DIAGNOSIS — Z3483 Encounter for supervision of other normal pregnancy, third trimester: Secondary | ICD-10-CM

## 2021-09-30 LAB — POCT URINALYSIS DIPSTICK OB
Bilirubin, UA: NEGATIVE
Blood, UA: NEGATIVE
Glucose, UA: NEGATIVE
Ketones, UA: NEGATIVE
Leukocytes, UA: NEGATIVE
Nitrite, UA: NEGATIVE
POC,PROTEIN,UA: NEGATIVE
Spec Grav, UA: 1.01 (ref 1.010–1.025)
Urobilinogen, UA: 0.2 E.U./dL
pH, UA: 7 (ref 5.0–8.0)

## 2021-09-30 NOTE — Progress Notes (Signed)
ROB: She has been having some sharp pain in her vagina that radiate into her legs. Pain comes and goes.

## 2021-09-30 NOTE — Progress Notes (Signed)
ROB: Complains of sharp vaginal pains, sometimes runs down her leg. Will discuss further with her physical therapist.  RTC in 1 week.

## 2021-10-02 ENCOUNTER — Ambulatory Visit: Payer: No Typology Code available for payment source

## 2021-10-02 ENCOUNTER — Other Ambulatory Visit: Payer: Self-pay

## 2021-10-02 DIAGNOSIS — M62838 Other muscle spasm: Secondary | ICD-10-CM | POA: Diagnosis not present

## 2021-10-02 DIAGNOSIS — M21372 Foot drop, left foot: Secondary | ICD-10-CM

## 2021-10-02 DIAGNOSIS — M533 Sacrococcygeal disorders, not elsewhere classified: Secondary | ICD-10-CM

## 2021-10-02 NOTE — Therapy (Signed)
[place] MAIN [facility] SERVICES [address] [place], [place], [address] Phone: [phone]   Fax:  [phone]  Physical Therapy Treatment  The patient has been informed of current processes in place at Outpatient Rehab to protect patients from Covid-19 exposure including social distancing, schedule modifications, and new cleaning procedures. After discussing their particular risk with a therapist based on the patient's personal risk factors, the patient has decided to proceed with in-person therapy.   Patient Details  Name: Lisa Brady MRN: [id number] Date of Birth: June 24, 1986 No data recorded  Encounter Date: 10/02/2021   PT End of Session - 10/02/21 0859     Visit Number 74    Number of Visits 63    Date for PT Re-Evaluation 41/74/08   recert 12/09/46   Authorization Type [employer]    Authorization - Visit Number 1    Authorization - Number of Visits 12    Progress Note Due on Visit 28    PT Start Time 0805    PT Stop Time 0850    PT Time Calculation (min) 45 min    Activity Tolerance Patient tolerated treatment well;No increased pain    Behavior During Therapy [facility] for tasks assessed/performed             Past Medical History:  Diagnosis Date   Dysplastic nevus 04/01/2021   right medial forearm, mod Atypia    Dysplastic nevus 04/01/2021   right inferior axilla, mild atypia    Dysplastic nevus 04/01/2021   right superior medial scapula, mild atypia    Foot drop    Hypermobility arthralgia    Maternal varicella, non-immune 11/20/2018   Scoliosis    Urinary incontinence     Past Surgical History:  Procedure Laterality Date   WISDOM TOOTH EXTRACTION  2006    There were no vitals filed for this visit.   Pelvic Floor Physical Therapy Treatment Note  SCREENING  Changes in medications, allergies, or medical history?: pregnant    SUBJECTIVE  Patient reports: She has been experiencing "lightning crotch" every  other day or so and occasionally shooting pain into BLE upon standing as well. Generally she has been doing fine with urinary issues. She had just a little "tightness" through the R inner thigh this morning.   Precautions:  She is [redacted] weeks pregnant,  Hypermobility and Scoliosis  Pain update:  Location of pain:  mid/upper back  (pubic symphysis) Current pain:  0/10 (0/10 with turning) Max pain:  0/10  (7/10) (fleeting "lightning crotch") Least pain:  0/10 (0/10) Nature of pain: tightness/achy  **no increased pain following treatment  Patient Goals: Control her bladder/not need depends.   OBJECTIVE  Changes in:   Observation:  Slight R anterior innominate rotation (from previous session)  R scapula protracted and winging>L (from prior session)  12/26/20: alignment not assessed before MMT performed, audble "pop" when performing MMT for L adduction and then pelvis assessed and appropriately aligned in supine.  01/09/21: Pt. Demonstrates R anterior rotation, L up-slip and LLD with LLE long was finally detected. **PSIS appear level in standing with 2/3 heel-lift in R shoe.   03/06/21: mild R anterior rotation, resolved following treatment.  03/20/21: L up-slip and R anterior/L posterior rotation, possible R out-flare.  **following treatment pubic symphysis level, up-slip corrected but R anterion rotation remaining.   04/17/21: R out-flare.  05/08/21: TTP to B IC and coccygeus. Sytrnge ~ 2/5 in standing and 2+/5 in supine at BOS. ~ 3+  following treatment in supine, Pt. Able to sense in standing following treatment.   05/22/21: ASIS and PSIS appeared level in standing but in supine R ASIS low and ankles level (RLE is short-leg). At EOS ASIS level and RLE short (appropriate) in supine.  06/12/21: ASIS and PSIS appear level in standing.  06/26/21: R anterior rotation and L up-slip.  07/24/21: mild R anterior rotation/ tension into rotation.   08/07/21: ASIS and PSIS level, RLE slightly  externally rotated in standing.   08/21/21: R anterior rotation and L up-slip  09/04/21: pelvis well aligned, R thoracic curve most evident.  09/18/21: pelvis well aligned, R thoracic curve most evident.  TODAY: Pelvis well aligned in standing and supine.  ROM/Mobility:  Decreased scapular depression and downward rotation ROM in R>L scapula. Decreased L cervical rotation with painful end-feel. Decreased mobility at pubic symphysis and pain with pressure at L Pubic ramus  (from prior session)  Pt demonstrates decreased upward rotation ROM for R scapula due to spasm/ fascial tightness that is limiting proprioception and recruitment for effective downward scapular rotation strengthening. (from previous session)  07/24/21: decreased thoracic PIVM  09/04/32: decreased R thoracic rotation mobility and decreased PIVM in PA and R to L rotational direction at spinous processes T~2-10  09/18/21: decreased PA PIVM through T/L junction and slightly through upper thoracic spine.  Strength:  Muscle fatigue reached earlier on R>L with tall-kneeling/balance exercises. Imbalance surrounding R scapula not allowing Pt. To perform I's, Y's, T's W's well. (from prior session)  MMT LE  - L DF 4+/5 (different rater may account for difference but could be due to spasms in back since test performed prior to DN/manual) - R DF 5/5 (from prior session)  Strength/MMT:  LE MMT (12/26/20) LE MMT Left Right  Hip flex:  (L2) /5 /5  Hip ext: 5/5 5/5  Hip abd: 4+/5 5/5  Hip add: 4+/5 5/5  Hip IR 4/5 4+/5  Hip ER 5/5 5/5  *audible "pop" with mild discomfort noted with resisted L adduction.  (02-06-21) LE MMT Left Right  Hip flex:  (L2) /5 /5  Hip ext: -5/5 5/5  Hip abd: 5/5 5/5  Hip add: 4+/5 4/5!  Hip IR 4+/5 4+/5  Hip ER 4++/5 4++/5    06/12/21 LE MMT Left Right  Hip flex:  (L2) /5 /5  Hip ext: 5/5 5/5  Hip abd: 5/5 5/5  Hip add: 4+/5 4+/5  Hip IR 4+/5 4+/5  Hip ER 5-/5 5-/5    Deep-core  strength/endurance sufficient to allow Pt. To perform toe-taps x5 in a row before rest needed.  As of 05/01/21: Pt. Demonstrated weakness in DF in fatiguing during BLE plantarflexion at 23 repetitions.  Pelvic floor: TTP through R posterior PR/PC and OI as well as 5 o'clock region of posterior fourchette. TTP to to L posterior fourchette at ~ 5 o'clock. Coordinated and able to keep probe in even at ~ 45 degrees. Increased force production when TA and obliques recruited. ~ 3+/5 strength and maximal endurance at ~ 8 seconds.  (from prior session)  1/7:  Strength ~ 2+/5 initially with TTP to anterior and posterior PR/PC as well as decreased fascial mobility along anterior/inferior aspect of bladder.  **following treatment Pt. Able to achieve 3++/5 with decreased recruitment of the posterior PR/PC and IC near coccyx remaining. (from previous session)  01/09/21: TTP to R>L coccygeus, OI, and IC near coccyx. Strength ~ 3/5 and 3 sec. endurance with poor recruitment of posterior PFM.  **following treatment,  strength ~ 3++/5 with much more squeeze from posterior PFM and ~ 8 sec. Hold.  04/03/21: TTP to B STP and bulbo as well as EUS and decreased mobility through 2nd layer fascia on R>L.  04/17/20: TTP to all except coccygeus on L and to R anterior and posterior PR/PC. 2/5 at BOS, Pt. demonstrated 3+/ 5 strength at EOS.  Palpation:  Greatest tenderness with pressure on spinous processes T5 and L2. (from previous session)  02/06/21: TTP to R TFL, iliopsoas, vastus lateralis and rectus femoris  03/06/21: TTP to R Iliacus, Psoas, TFL, and vastus lateralis.  03/20/21:  TTP to R iliacus L QL, adductor brevis and pectineus  04/17/21: TTP to B diaphragm.  05/22/21: TTP to L QL, R iliacus , TFL and lateral quads  06/12/21: TTP to B adductor magnus/longus  06/26/21: TTP to R OI and Piriformis, B QL.  07/24/21: TTP to R TFL with decreased fascial mobility.  08/07/21: TTP to R adductor brevis, gracilis, vastus  intermedius, OI, Piriformis, and TFL  08/21/21: TTP to R>L iliacus and psoas and L>R QL  09/04/32: TTP to multifidus through ~ T4-8 on the R.  09/18/21: TTP to B coccygeus (palpated externally) and through multifidus near ~ T3-5 on L>R  TODAY: TTP to B adductors and rectus abdominus near pubic symphysis  Gait Analysis: (from prior session) Gait speed: 1.62ms ; anterior pelvic tilt, knee hyperextension, heel strike - with cues pelvic neutral achieved, with appropriate arm swing and decreased heel strike.     INTERVENTIONS THIS SESSION:   Manual: Performed TP release to B adductors and rectus abdominus near pubic symphysis to decrease spasm and pain, and improved balance of musculature for improved function and decreased symptoms.  Self-care: Educated on and demonstrated the importance of avoiding pushing in lithotomy position as much as possible as well as discussed alternatives even with epidural in place including side-lying with a peanut ball.  Total time:  421m.                               PT Short Term Goals - 09/18/21 0901       PT SHORT TERM GOAL #1   Title Patient will demonstrate improved pelvic alignment and balance of musculature surrounding the pelvis to facilitate decreased PFM spasms and decrease pressure on nerve roots to allow for optimal PFM function    Baseline Has L foot-drop and pain in L glutes/knee as well as inability to sense PFM and L posterior innominate rotation/spasms surrounding pelvis.    Time 5    Period Weeks    Status Achieved    Target Date 11/26/19      PT SHORT TERM GOAL #2   Title Patient will demonstrate HEP x1 in the clinic to demonstrate understanding and proper form to allow for further improvement.    Baseline Pt. lacks knowledge of therapeutic ecersise that will decrease Sx.    Time 5    Period Weeks    Status Achieved    Target Date 11/26/19      PT SHORT TERM GOAL #3   Title Pt. will be able to walk  250 Ft.with CGA and w/o assistive device to demonstrate improved balance/decreased instability and foot drop.    Baseline Pt. using RW to AMB due to L foot-drop and increased instability.    Time 5    Period Weeks    Status Achieved    Target Date 08/20/19  PT SHORT TERM GOAL #4   Title Patient will demonstrate a coordinated contraction, relaxation, and bulge of the pelvic floor muscles to demonstrate functional recruitment and motion and allow for further strengthening.    Baseline Pt. reports inability to sense the PFM for kegel or relaxation, having mixed UI    Time 5    Period Weeks    Status Achieved    Target Date 08/20/19      PT SHORT TERM GOAL #5   Title Pt. will be able to demonstrate tall/half kneeling exercises for 10-15 seconds without increased pelvic pressure/UI sensation.    Baseline Pt. has avoided half-kneeling exercises because they increased Sx.    Time 6    Period Weeks    Status On-going    Target Date 10/30/21               PT Long Term Goals - 09/18/21 0901       PT LONG TERM GOAL #1   Title Patient will report no episodes of UUI/SUI over the course of the prior two weeks to demonstrate improved functional ability.    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 As of 4/30: She had been doing better but then took a step backward when she was sick and vomiting a lot. As of 7/9: Pt. continues to have decreased incontinence but had an episode of total bladder loss and UUI ~ 1-2 times/day still (worsened due to overuse/spasm of LB musculature. As of 11/12: She had one "big accident" and one "snexe-pee" accident over the week. As of 05/01/21: Pt. has had an increase in Sx. since becoming pregnant and has been fit for a pessary which she will begin using soon to prevent further UI worsening over the course of her pregnancy along with further PFM strengthening.  As of  07/10/21: no UUI and able to make it to the toilet before leakage, SUI with coughing occurred lately with colds    Time 10    Period Weeks    Status On-going    Target Date 10/30/21      PT LONG TERM GOAL #2   Title Patient will report no pain with intercourse to demonstrate improved functional ability.    Baseline having mild pain with initial penetration As of 1:29: had some pain at initial penetration and with deeper thrusting at ~ 3-4/10 but "not terrible" 10/17/20: has not attempted to know As of 05/01/21: Pt. had some radiating pain with pessary fitting even following release the prior Friday, reports "not as bad as it could've been".  07/10/21: no pain    Time 10    Period Weeks    Status Achieved    Target Date 07/10/21      PT LONG TERM GOAL #3   Title Patient will score at or below 22/100 on the UDI and 27/100 on the UIQ to demonstrate a clinically meaningful decrease in disability and distress due to pelvic floor dysfunction.    Baseline UDI: 67, UIQ: 72/100 10-19-19: UDI 55.5, UIQ 76/100; POPDI 11/100 As of 1/29: UDI:33/100, UIQ: 6/100, POPDI:6/100. As of 4/30: Due to set-back from vomiting. As of 11/12: UDI: 17/100, UIQ: POPDI: 11/100,    Time 10    Period Weeks    Status Achieved    Target Date 08/22/20      PT LONG TERM GOAL #4   Title Pt. 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    Baseline Pt. requiring RW for AMB due to L foot-drop and imbalance. As of 10/12: Pt. requiring some support (e.g. grocery cart) for > 20 min. of AMB or stairs.    Time 10    Period Weeks    Status Achieved    Target Date 11/25/20      PT LONG TERM GOAL #5   Title Pt. will improve in FOTO score by 10 points to demonstrate improved function.    Baseline FOTO PFDI Urinary: 29, Urinary Problem:54    Time 10    Period Weeks    Status On-going    Target Date 10/30/21      PT LONG TERM  GOAL #6   Title Patient will demonstrate 4+/5 strength or better in the PF and surrounding musculature to show improved functional strength for childcare and other vocational and recreational activities.    Baseline PFM 3/5, As of 04/17/21: 3+/5.  07/10/21: will reassess next session    Time 10    Period Weeks    Status On-going    Target Date 10/30/21      PT LONG TERM GOAL #7   Title Pt will demo no R shoulder IR, levelled shoulder height, and no anterior rotation of R PSIS across 2 visits to demo IND with scoliosis HEP and pelvic stability.    Time 10    Period Weeks    Status On-going    Target Date 10/30/21                   Plan - 10/02/21 0900     Clinical Impression Statement Pt. Responded well to all interventions today, demonstrating decreased spasm and TTP as well as understanding and correct performance of all education and exercises provided today. They will continue to benefit from skilled physical therapy to work toward remaining goals and maximize function as well as decrease likelihood of symptom increase or recurrence.     PT Next Visit Plan Review Spinning babies baby mapping, jiggling, etc.    PT Home Exercise Plan day: Teapot, Side-planks, Planks, rows, scap retractions, (blue band). Mini-Marches with leg lift,  standing hip ABD and EXT, modified bridge, tall kneeling. lean-backs, calf raises.     B day: Child's pose V-sit/ single leg hamstring stretch,. Side stretch, Piriformis stretch, Door stretch,  stretch levator scap (neck) on R    Every day: Kegels 3-5x/day with long-hold and pelvic tilt with sneeze, laugh/cough kegel, vaginal weights, mult-direction stepping.        Corrective: Upper cervical rotation Tennis ball release added MET for R anterior for 1 week. Heel-lift for RLE. self external PFM release. Hip IR/ER (new priority 09/04/21, walking, squats, cat/cow and scoliosis specific exercises)    Consulted and Agree with Plan of Care Patient              Patient will benefit from skilled therapeutic intervention in order to improve the following deficits and impairments:     Visit Diagnosis: Other muscle spasm  Foot drop, left  Sacrococcygeal disorders, not elsewhere classified     Problem List Patient Active Problem List   Diagnosis Date Noted   Choroid plexus cyst of fetus on prenatal ultrasound 05/29/2021   History of vacuum extraction assisted delivery 04/03/2021   H/O macrosomia in infant in prior pregnancy, currently pregnant 04/03/2021  AMA (advanced maternal age) multigravida 58+, first trimester 04/03/2021   Hypermobility arthralgia 02/25/2021   Positive urine pregnancy test 02/25/2021   Chronic pain of right knee 09/17/2020   Urinary incontinence 08/17/2019   History of left foot drop 08/17/2019   Willa Rough DPT, ATC Willa Rough, PT 10/02/2021, 3:31 PM  [place] MAIN [facility] SERVICES [address] [place], [place], [address] Phone: [phone]   Fax:  [phone]  Name: Lisa Brady MRN: [id number] Date of Birth: January 08, 1986

## 2021-10-07 ENCOUNTER — Encounter: Payer: Self-pay | Admitting: Obstetrics and Gynecology

## 2021-10-07 ENCOUNTER — Ambulatory Visit (INDEPENDENT_AMBULATORY_CARE_PROVIDER_SITE_OTHER): Payer: No Typology Code available for payment source | Admitting: Obstetrics and Gynecology

## 2021-10-07 ENCOUNTER — Other Ambulatory Visit: Payer: Self-pay

## 2021-10-07 VITALS — Wt 148.0 lb

## 2021-10-07 DIAGNOSIS — Z3A39 39 weeks gestation of pregnancy: Secondary | ICD-10-CM | POA: Diagnosis not present

## 2021-10-07 DIAGNOSIS — O09523 Supervision of elderly multigravida, third trimester: Secondary | ICD-10-CM | POA: Diagnosis not present

## 2021-10-07 LAB — POCT URINALYSIS DIPSTICK OB
Bilirubin, UA: NEGATIVE
Blood, UA: NEGATIVE
Glucose, UA: NEGATIVE
Ketones, UA: 15
Leukocytes, UA: NEGATIVE
Nitrite, UA: NEGATIVE
POC,PROTEIN,UA: NEGATIVE
Spec Grav, UA: 1.01 (ref 1.010–1.025)
Urobilinogen, UA: 0.2 E.U./dL
pH, UA: 6 (ref 5.0–8.0)

## 2021-10-07 NOTE — Progress Notes (Signed)
OB-Pt present for routine prenatal care. Pt stated that she is doing well.

## 2021-10-07 NOTE — Progress Notes (Signed)
ROB: Doing well.  Signs and symptoms of labor reviewed.  Patient has NST scheduled with next visit.  Schedule induction at next visit.

## 2021-10-09 ENCOUNTER — Ambulatory Visit: Payer: No Typology Code available for payment source | Attending: Obstetrics and Gynecology

## 2021-10-09 ENCOUNTER — Other Ambulatory Visit: Payer: Self-pay

## 2021-10-09 DIAGNOSIS — M62838 Other muscle spasm: Secondary | ICD-10-CM | POA: Diagnosis present

## 2021-10-09 DIAGNOSIS — M21372 Foot drop, left foot: Secondary | ICD-10-CM | POA: Insufficient documentation

## 2021-10-09 DIAGNOSIS — M533 Sacrococcygeal disorders, not elsewhere classified: Secondary | ICD-10-CM | POA: Insufficient documentation

## 2021-10-09 NOTE — Therapy (Signed)
[place] [facility] SERVICES [address] [place], [place], [address] Phone: [phone]   Fax:  [phone]  Physical Therapy Treatment  The patient has been informed of current processes in place at Outpatient Rehab to protect patients from Covid-19 exposure including social distancing, schedule modifications, and new cleaning procedures. After discussing their particular risk with a therapist based on the patient's personal risk factors, the patient has decided to proceed with in-person therapy.   Patient Details  Name: Lisa Brady MRN: [id number] Date of Birth: 07-06-1986 No data recorded  Encounter Date: 10/09/2021   PT End of Session - 10/09/21 0809     Visit Number 102    Number of Visits 101    Date for PT Re-Evaluation 23/34/35   recert 05/13/60   Authorization Type [employer]    Authorization - Visit Number 2    Authorization - Number of Visits 12    Progress Note Due on Visit 83    PT Start Time 0803    PT Stop Time 0903    PT Time Calculation (min) 60 min    Activity Tolerance Patient tolerated treatment well;No increased pain    Behavior During Therapy [facility] for tasks assessed/performed             Past Medical History:  Diagnosis Date   Dysplastic nevus 04/01/2021   right medial forearm, mod Atypia    Dysplastic nevus 04/01/2021   right inferior axilla, mild atypia    Dysplastic nevus 04/01/2021   right superior medial scapula, mild atypia    Foot drop    Hypermobility arthralgia    Maternal varicella, non-immune 11/20/2018   Scoliosis    Urinary incontinence     Past Surgical History:  Procedure Laterality Date   WISDOM TOOTH EXTRACTION  2006    There were no vitals filed for this visit.  Pelvic Floor Physical Therapy Treatment Note  SCREENING  Changes in medications, allergies, or medical history?: pregnant    SUBJECTIVE  Patient reports: She had some leg pain after the last visit but the  "lightning crotch" is gone. She has had a little more urinary frequency about every 1-2 hours and has had to get up ore at night to pee but thinks it is likely just hitting that point in her pregnancy.   Precautions:  She is [redacted] weeks pregnant,  Hypermobility and Scoliosis  Pain update:  Location of pain:  mid/upper back  (pubic symphysis) Current pain:  0/10 (0/10 with turning) Max pain:  0/10  (7/10) (fleeting "lightning crotch") Least pain:  0/10 (0/10) Nature of pain: tightness/achy  **no increased pain following treatment  Patient Goals: Control her bladder/not need depends.   OBJECTIVE  Changes in:   Observation:  Slight R anterior innominate rotation (from previous session)  R scapula protracted and winging>L (from prior session)  12/26/20: alignment not assessed before MMT performed, audble "pop" when performing MMT for L adduction and then pelvis assessed and appropriately aligned in supine.  01/09/21: Pt. Demonstrates R anterior rotation, L up-slip and LLD with LLE long was finally detected. **PSIS appear level in standing with 2/3 heel-lift in R shoe.   03/06/21: mild R anterior rotation, resolved following treatment.  03/20/21: L up-slip and R anterior/L posterior rotation, possible R out-flare.  **following treatment pubic symphysis level, up-slip corrected but R anterion rotation remaining.   04/17/21: R out-flare.  05/08/21: TTP to B IC and coccygeus. Sytrnge ~ 2/5 in standing and 2+/5  in supine at BOS. ~ 3+ following treatment in supine, Pt. Able to sense in standing following treatment.   05/22/21: ASIS and PSIS appeared level in standing but in supine R ASIS low and ankles level (RLE is short-leg). At EOS ASIS level and RLE short (appropriate) in supine.  06/12/21: ASIS and PSIS appear level in standing.  06/26/21: R anterior rotation and L up-slip.  07/24/21: mild R anterior rotation/ tension into rotation.   08/07/21: ASIS and PSIS level, RLE slightly externally  rotated in standing.   08/21/21: R anterior rotation and L up-slip  09/04/21: pelvis well aligned, R thoracic curve most evident.  09/18/21: pelvis well aligned, R thoracic curve most evident.  TODAY: Pelvis well aligned in standing and supine.  ROM/Mobility:  Decreased scapular depression and downward rotation ROM in R>L scapula. Decreased L cervical rotation with painful end-feel. Decreased mobility at pubic symphysis and pain with pressure at L Pubic ramus  (from prior session)  Pt demonstrates decreased upward rotation ROM for R scapula due to spasm/ fascial tightness that is limiting proprioception and recruitment for effective downward scapular rotation strengthening. (from previous session)  07/24/21: decreased thoracic PIVM  09/04/32: decreased R thoracic rotation mobility and decreased PIVM in PA and R to L rotational direction at spinous processes T~2-10  09/18/21: decreased PA PIVM through T/L junction and slightly through upper thoracic spine.  Strength:  Muscle fatigue reached earlier on R>L with tall-kneeling/balance exercises. Imbalance surrounding R scapula not allowing Pt. To perform I's, Y's, T's W's well. (from prior session)  MMT LE  - L DF 4+/5 (different rater may account for difference but could be due to spasms in back since test performed prior to DN/manual) - R DF 5/5 (from prior session)  Strength/MMT:  LE MMT (12/26/20) LE MMT Left Right  Hip flex:  (L2) /5 /5  Hip ext: 5/5 5/5  Hip abd: 4+/5 5/5  Hip add: 4+/5 5/5  Hip IR 4/5 4+/5  Hip ER 5/5 5/5  *audible "pop" with mild discomfort noted with resisted L adduction.  (02-06-21) LE MMT Left Right  Hip flex:  (L2) /5 /5  Hip ext: -5/5 5/5  Hip abd: 5/5 5/5  Hip add: 4+/5 4/5!  Hip IR 4+/5 4+/5  Hip ER 4++/5 4++/5    06/12/21 LE MMT Left Right  Hip flex:  (L2) /5 /5  Hip ext: 5/5 5/5  Hip abd: 5/5 5/5  Hip add: 4+/5 4+/5  Hip IR 4+/5 4+/5  Hip ER 5-/5 5-/5    Deep-core strength/endurance  sufficient to allow Pt. To perform toe-taps x5 in a row before rest needed.  As of 05/01/21: Pt. Demonstrated weakness in DF in fatiguing during BLE plantarflexion at 23 repetitions.  Pelvic floor: TTP through R posterior PR/PC and OI as well as 5 o'clock region of posterior fourchette. TTP to to L posterior fourchette at ~ 5 o'clock. Coordinated and able to keep probe in even at ~ 45 degrees. Increased force production when TA and obliques recruited. ~ 3+/5 strength and maximal endurance at ~ 8 seconds.  (from prior session)  1/7:  Strength ~ 2+/5 initially with TTP to anterior and posterior PR/PC as well as decreased fascial mobility along anterior/inferior aspect of bladder.  **following treatment Pt. Able to achieve 3++/5 with decreased recruitment of the posterior PR/PC and IC near coccyx remaining. (from previous session)  01/09/21: TTP to R>L coccygeus, OI, and IC near coccyx. Strength ~ 3/5 and 3 sec. endurance with poor recruitment  of posterior PFM.  **following treatment, strength ~ 3++/5 with much more squeeze from posterior PFM and ~ 8 sec. Hold.  04/03/21: TTP to B STP and bulbo as well as EUS and decreased mobility through 2nd layer fascia on R>L.  04/17/20: TTP to all except coccygeus on L and to R anterior and posterior PR/PC. 2/5 at BOS, Pt. demonstrated 3+/ 5 strength at EOS.  TODAY: TTP to B PR/PC anteriorly and posteriorly as well as to B STP and L IC externally.   Palpation:  Greatest tenderness with pressure on spinous processes T5 and L2. (from previous session)  02/06/21: TTP to R TFL, iliopsoas, vastus lateralis and rectus femoris  03/06/21: TTP to R Iliacus, Psoas, TFL, and vastus lateralis.  03/20/21:  TTP to R iliacus L QL, adductor brevis and pectineus  04/17/21: TTP to B diaphragm.  05/22/21: TTP to L QL, R iliacus , TFL and lateral quads  06/12/21: TTP to B adductor magnus/longus  06/26/21: TTP to R OI and Piriformis, B QL.  07/24/21: TTP to R TFL with decreased  fascial mobility.  08/07/21: TTP to R adductor brevis, gracilis, vastus intermedius, OI, Piriformis, and TFL  08/21/21: TTP to R>L iliacus and psoas and L>R QL  09/04/32: TTP to multifidus through ~ T4-8 on the R.  09/18/21: TTP to B coccygeus (palpated externally) and through multifidus near ~ T3-5 on L>R  10/02/21: TTP to B adductors and rectus abdominus near pubic symphysis  TODAY: Mild TTP to B mons.  Gait Analysis: (from prior session) Gait speed: 1.59ms ; anterior pelvic tilt, knee hyperextension, heel strike - with cues pelvic neutral achieved, with appropriate arm swing and decreased heel strike.     INTERVENTIONS THIS SESSION:   Manual: Performed TP release to B PR/PC anteriorly and posteriorly as well as to B STP and L IC externally to decrease spasm and pain, and allow for PFM relaxation to facilitate smooth delivery.   NM re-ed: Practiced coordination of squeeze, release, and bulge to decrease hemorrhoids and risk of tearing with delivery.  Total time:  628m.                               PT Short Term Goals - 09/18/21 0901       PT SHORT TERM GOAL #1   Title Patient will demonstrate improved pelvic alignment and balance of musculature surrounding the pelvis to facilitate decreased PFM spasms and decrease pressure on nerve roots to allow for optimal PFM function    Baseline Has L foot-drop and pain in L glutes/knee as well as inability to sense PFM and L posterior innominate rotation/spasms surrounding pelvis.    Time 5    Period Weeks    Status Achieved    Target Date 11/26/19      PT SHORT TERM GOAL #2   Title Patient will demonstrate HEP x1 in the clinic to demonstrate understanding and proper form to allow for further improvement.    Baseline Pt. lacks knowledge of therapeutic ecersise that will decrease Sx.    Time 5    Period Weeks    Status Achieved    Target Date 11/26/19      PT SHORT TERM GOAL #3   Title Pt. will be able  to walk 250 Ft.with CGA and w/o assistive device to demonstrate improved balance/decreased instability and foot drop.    Baseline Pt. using RW to AMB due to L foot-drop  and increased instability.    Time 5    Period Weeks    Status Achieved    Target Date 08/20/19      PT SHORT TERM GOAL #4   Title Patient will demonstrate a coordinated contraction, relaxation, and bulge of the pelvic floor muscles to demonstrate functional recruitment and motion and allow for further strengthening.    Baseline Pt. reports inability to sense the PFM for kegel or relaxation, having mixed UI    Time 5    Period Weeks    Status Achieved    Target Date 08/20/19      PT SHORT TERM GOAL #5   Title Pt. will be able to demonstrate tall/half kneeling exercises for 10-15 seconds without increased pelvic pressure/UI sensation.    Baseline Pt. has avoided half-kneeling exercises because they increased Sx.    Time 6    Period Weeks    Status On-going    Target Date 10/30/21               PT Long Term Goals - 09/18/21 0901       PT LONG TERM GOAL #1   Title Patient will report no episodes of UUI/SUI over the course of the prior two weeks to demonstrate improved functional ability.    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 As of 4/30: She had been doing better but then took a step backward when she was sick and vomiting a lot. As of 7/9: Pt. continues to have decreased incontinence but had an episode of total bladder loss and UUI ~ 1-2 times/day still (worsened due to overuse/spasm of LB musculature. As of 11/12: She had one "big accident" and one "snexe-pee" accident over the week. As of 05/01/21: Pt. has had an increase in Sx. since becoming pregnant and has been fit for a pessary which she will begin using soon to prevent further UI worsening over the course of her pregnancy along with further PFM strengthening.   As of 07/10/21: no UUI and able to make it to the toilet before leakage, SUI with coughing occurred lately with colds    Time 10    Period Weeks    Status On-going    Target Date 10/30/21      PT LONG TERM GOAL #2   Title Patient will report no pain with intercourse to demonstrate improved functional ability.    Baseline having mild pain with initial penetration As of 1:29: had some pain at initial penetration and with deeper thrusting at ~ 3-4/10 but "not terrible" 10/17/20: has not attempted to know As of 05/01/21: Pt. had some radiating pain with pessary fitting even following release the prior Friday, reports "not as bad as it could've been".  07/10/21: no pain    Time 10    Period Weeks    Status Achieved    Target Date 07/10/21      PT LONG TERM GOAL #3   Title Patient will score at or below 22/100 on the UDI and 27/100 on the UIQ to demonstrate a clinically meaningful decrease in disability and distress due to pelvic floor dysfunction.    Baseline UDI: 67, UIQ: 72/100 10-19-19: UDI 55.5, UIQ 76/100; POPDI 11/100 As of 1/29: UDI:33/100, UIQ: 6/100, POPDI:6/100. As of 4/30: Due to set-back from vomiting. As of 11/12: UDI: 17/100, UIQ: POPDI: 11/100,    Time 10  Period Weeks    Status Achieved    Target Date 08/22/20      PT LONG TERM GOAL #4   Title Pt. 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    Baseline Pt. requiring RW for AMB due to L foot-drop and imbalance. As of 10/12: Pt. requiring some support (e.g. grocery cart) for > 20 min. of AMB or stairs.    Time 10    Period Weeks    Status Achieved    Target Date 11/25/20      PT LONG TERM GOAL #5   Title Pt. will improve in FOTO score by 10 points to demonstrate improved function.    Baseline FOTO PFDI Urinary: 29, Urinary Problem:54    Time 10    Period Weeks    Status On-going    Target Date 10/30/21      PT LONG  TERM GOAL #6   Title Patient will demonstrate 4+/5 strength or better in the PF and surrounding musculature to show improved functional strength for childcare and other vocational and recreational activities.    Baseline PFM 3/5, As of 04/17/21: 3+/5.  07/10/21: will reassess next session    Time 10    Period Weeks    Status On-going    Target Date 10/30/21      PT LONG TERM GOAL #7   Title Pt will demo no R shoulder IR, levelled shoulder height, and no anterior rotation of R PSIS across 2 visits to demo IND with scoliosis HEP and pelvic stability.    Time 10    Period Weeks    Status On-going    Target Date 10/30/21                   Plan - 10/09/21 1030     Clinical Impression Statement Pt. Responded well to all interventions today, demonstrating decreased TTP/Spasm and improved coordination with bearing down as well as understanding and correct performance of all education and exercises provided today. They will continue to benefit from skilled physical therapy to work toward remaining goals and maximize function as well as decrease likelihood of symptom increase or recurrence.   PT Next Visit Plan Review Spinning babies baby mapping, jiggling, etc.    PT Home Exercise Plan day: Teapot, Side-planks, Planks, rows, scap retractions, (blue band). Mini-Marches with leg lift,  standing hip ABD and EXT, modified bridge, tall kneeling. lean-backs, calf raises.     B day: Child's pose V-sit/ single leg hamstring stretch,. Side stretch, Piriformis stretch, Door stretch,  stretch levator scap (neck) on R    Every day: Kegels 3-5x/day with long-hold and pelvic tilt with sneeze, laugh/cough kegel, vaginal weights, mult-direction stepping.        Corrective: Upper cervical rotation Tennis ball release added MET for R anterior for 1 week. Heel-lift for RLE. self external PFM release. Hip IR/ER (new priority 09/04/21, walking, squats, cat/cow and scoliosis specific exercises)    Consulted and Agree  with Plan of Care Patient             Patient will benefit from skilled therapeutic intervention in order to improve the following deficits and impairments:     Visit Diagnosis: Other muscle spasm  Foot drop, left  Sacrococcygeal disorders, not elsewhere classified     Problem List Patient Active Problem List   Diagnosis Date Noted   Choroid  plexus cyst of fetus on prenatal ultrasound 05/29/2021   History of vacuum extraction assisted delivery 04/03/2021   H/O macrosomia in infant in prior pregnancy, currently pregnant 04/03/2021   AMA (advanced maternal age) multigravida 35+, first trimester 04/03/2021   Hypermobility arthralgia 02/25/2021   Positive urine pregnancy test 02/25/2021   Chronic pain of right knee 09/17/2020   Urinary incontinence 08/17/2019   History of left foot drop 08/17/2019   Willa Rough DPT, ATC Willa Rough, PT 10/09/2021, 10:41 AM  [facility] [address] [place], [place], [address] Phone: [phone]   Fax:  [phone]  Name: Lisa Brady MRN: [id number] Date of Birth: 1986-09-19

## 2021-10-14 ENCOUNTER — Other Ambulatory Visit: Payer: Self-pay

## 2021-10-14 ENCOUNTER — Other Ambulatory Visit: Payer: No Typology Code available for payment source

## 2021-10-14 ENCOUNTER — Ambulatory Visit (INDEPENDENT_AMBULATORY_CARE_PROVIDER_SITE_OTHER): Payer: No Typology Code available for payment source | Admitting: Obstetrics and Gynecology

## 2021-10-14 VITALS — BP 103/62 | HR 91 | Wt 151.3 lb

## 2021-10-14 DIAGNOSIS — O09523 Supervision of elderly multigravida, third trimester: Secondary | ICD-10-CM | POA: Diagnosis not present

## 2021-10-14 DIAGNOSIS — O480 Post-term pregnancy: Secondary | ICD-10-CM

## 2021-10-14 DIAGNOSIS — Z3A40 40 weeks gestation of pregnancy: Secondary | ICD-10-CM

## 2021-10-14 LAB — POCT URINALYSIS DIPSTICK OB
Bilirubin, UA: NEGATIVE
Blood, UA: NEGATIVE
Glucose, UA: NEGATIVE
Ketones, UA: NEGATIVE
Leukocytes, UA: NEGATIVE
Nitrite, UA: NEGATIVE
POC,PROTEIN,UA: NEGATIVE
Spec Grav, UA: <=1.005 — AB (ref 1.010–1.025)
Urobilinogen, UA: 0.2 E.U./dL
pH, UA: 6.5 (ref 5.0–8.0)

## 2021-10-14 NOTE — Progress Notes (Signed)
OB-pt present for routine prenatal care and NST.

## 2021-10-15 ENCOUNTER — Other Ambulatory Visit
Admission: RE | Admit: 2021-10-15 | Discharge: 2021-10-15 | Disposition: A | Discharge status: Home / Self Care | Payer: No Typology Code available for payment source | Source: Ambulatory Visit | Attending: Obstetrics and Gynecology | Admitting: Obstetrics and Gynecology

## 2021-10-15 DIAGNOSIS — O9081 Anemia of the puerperium: Secondary | ICD-10-CM | POA: Diagnosis not present

## 2021-10-15 DIAGNOSIS — Z20822 Contact with and (suspected) exposure to covid-19: Secondary | ICD-10-CM

## 2021-10-15 DIAGNOSIS — O480 Post-term pregnancy: Principal | ICD-10-CM | POA: Diagnosis present

## 2021-10-15 DIAGNOSIS — D62 Acute posthemorrhagic anemia: Secondary | ICD-10-CM | POA: Diagnosis not present

## 2021-10-15 DIAGNOSIS — Z3A40 40 weeks gestation of pregnancy: Secondary | ICD-10-CM | POA: Diagnosis not present

## 2021-10-15 LAB — SARS CORONAVIRUS 2 (TAT 6-24 HRS): SARS Coronavirus 2: NEGATIVE

## 2021-10-15 NOTE — Patient Instructions (Signed)
Labor Induction Labor induction is when steps are taken to cause a pregnant woman to begin the labor process. Most women go into labor on their own between 37 weeks and 42 weeks of pregnancy. When this does not happen, or when there is a medical need for labor to begin, steps may be taken to induce, or bring on, labor. Labor induction causes a pregnant woman's uterus to contract. It also causes the cervix to soften (ripen), open (dilate), and thin out. Usually, labor is not induced before 39 weeks of pregnancy unless there is a medical reason to do so. When is labor induction considered? Labor induction may be right for you if: Your pregnancy lasts longer than 41 to 42 weeks. Your placenta is separating from your uterus (placental abruption). You have a rupture of membranes and your labor does not begin. You have health problems, like diabetes or high blood pressure (preeclampsia) during your pregnancy. Your baby has stopped growing or does not have enough amniotic fluid. Before labor induction begins, your health care provider will consider the following factors: Your medical condition and the baby's condition. How many weeks you have been pregnant. How mature the baby's lungs are. The condition of your cervix. The position of the baby. The size of your birth canal. Tell a health care provider about: Any allergies you have. All medicines you are taking, including vitamins, herbs, eye drops, creams, and over-the-counter medicines. Any problems you or your family members have had with anesthetic medicines. Any surgeries you have had. Any blood disorders you have. Any medical conditions you have. What are the risks? Generally, this is a safe procedure. However, problems may occur, including: Failed induction. Changes in fetal heart rate, such as being too high, too low, or irregular (erratic). Infection in the mother or the baby. Increased risk of having a cesarean delivery. Breaking off  (abruption) of the placenta from the uterus. This is rare. Rupture of the uterus. This is very rare. Your baby could fail to get enough blood flow or oxygen. This can be life-threatening. When induction is needed for medical reasons, the benefits generally outweigh the risks. What happens during the procedure? During the procedure, your health care provider will use one of these methods to induce labor: Stripping the membranes. In this method, the amniotic sac tissue is gently separated from the cervix. This causes the following to happen: Your cervix stretches, which in turn causes the release of prostaglandins. Prostaglandins induce labor and cause the uterus to contract. This procedure is often done in an office visit. You will be sent home to wait for contractions to begin. Prostaglandin medicine. This medicine starts contractions and causes the cervix to dilate and ripen. This can be taken by mouth (orally) or by being inserted into the vagina (suppository). Inserting a small, thin tube (catheter) with a balloon into the vagina and then expanding the balloon with water to dilate the cervix. Breaking the water. In this method, a small instrument is used to make a small hole in the amniotic sac. This eventually causes the amniotic sac to break. Contractions should begin within a few hours. Medicine to trigger or strengthen contractions. This medicine is given through an IV that is inserted into a vein in your arm. This procedure may vary among health care providers and hospitals. Where to find more information March of [employer]: www.marchofdimes.org The American College of Obstetricians and Gynecologists: www.acog.org Summary Labor induction causes a pregnant woman's uterus to contract. It also causes the cervix   to soften (ripen), open (dilate), and thin out. Labor is usually not induced before 39 weeks of pregnancy unless there is a medical reason to do so. When induction is needed for medical  reasons, the benefits generally outweigh the risks. Talk with your health care provider about which methods of labor induction are right for you. This information is not intended to replace advice given to you by your health care provider. Make sure you discuss any questions you have with your health care provider. Document Revised: 09/04/2020 Document Reviewed: 09/04/2020 Elsevier Patient Education  [place].

## 2021-10-15 NOTE — Progress Notes (Signed)
ROB: Patient with no major complaints. NST performed today for postdates pregnancy was reviewed and was found to be reactive.  Discussed IOL by 41 weeks if no delivery.  Scheduled for 10/16/2021 (only available date in the next 7-10 days). Cervical exam deferred today.  Advised on COVID screening, hospital visitation policy.    NONSTRESS TEST INTERPRETATION  INDICATIONS: Postdates pregnancy  FHR baseline: 140 bpm RESULTS:Reactive and Inconclusive COMMENTS: Uterine irritability and occasional ctx    PLAN: 1. Continue fetal kick counts  2. Scheduled for IOL in 2 days for postdates pregnancy

## 2021-10-16 ENCOUNTER — Inpatient Hospital Stay: Payer: No Typology Code available for payment source | Admitting: Anesthesiology

## 2021-10-16 ENCOUNTER — Other Ambulatory Visit: Payer: Self-pay

## 2021-10-16 ENCOUNTER — Inpatient Hospital Stay
Admission: EM | Admit: 2021-10-16 | Discharge: 2021-10-17 | DRG: 806 | Disposition: A | Discharge status: Home / Self Care | Payer: No Typology Code available for payment source | Attending: Obstetrics and Gynecology | Admitting: Obstetrics and Gynecology

## 2021-10-16 ENCOUNTER — Encounter: Payer: Self-pay | Admitting: Obstetrics and Gynecology

## 2021-10-16 DIAGNOSIS — O09893 Supervision of other high risk pregnancies, third trimester: Secondary | ICD-10-CM | POA: Diagnosis not present

## 2021-10-16 DIAGNOSIS — O480 Post-term pregnancy: Secondary | ICD-10-CM | POA: Diagnosis present

## 2021-10-16 DIAGNOSIS — O09523 Supervision of elderly multigravida, third trimester: Secondary | ICD-10-CM | POA: Diagnosis not present

## 2021-10-16 DIAGNOSIS — Z8739 Personal history of other diseases of the musculoskeletal system and connective tissue: Secondary | ICD-10-CM

## 2021-10-16 DIAGNOSIS — Z3A40 40 weeks gestation of pregnancy: Secondary | ICD-10-CM | POA: Diagnosis not present

## 2021-10-16 DIAGNOSIS — O09299 Supervision of pregnancy with other poor reproductive or obstetric history, unspecified trimester: Secondary | ICD-10-CM

## 2021-10-16 DIAGNOSIS — R32 Unspecified urinary incontinence: Secondary | ICD-10-CM | POA: Diagnosis present

## 2021-10-16 DIAGNOSIS — O09521 Supervision of elderly multigravida, first trimester: Secondary | ICD-10-CM | POA: Diagnosis present

## 2021-10-16 DIAGNOSIS — O9081 Anemia of the puerperium: Secondary | ICD-10-CM | POA: Diagnosis not present

## 2021-10-16 DIAGNOSIS — D62 Acute posthemorrhagic anemia: Secondary | ICD-10-CM | POA: Diagnosis not present

## 2021-10-16 DIAGNOSIS — O9903 Anemia complicating the puerperium: Secondary | ICD-10-CM

## 2021-10-16 DIAGNOSIS — Z20822 Contact with and (suspected) exposure to covid-19: Secondary | ICD-10-CM | POA: Diagnosis present

## 2021-10-16 LAB — TYPE AND SCREEN
ABO/RH(D): A POS
Antibody Screen: NEGATIVE

## 2021-10-16 LAB — CBC
HCT: 37.9 % (ref 36.0–46.0)
Hemoglobin: 13 g/dL (ref 12.0–15.0)
MCH: 30.7 pg (ref 26.0–34.0)
MCHC: 34.3 g/dL (ref 30.0–36.0)
MCV: 89.6 fL (ref 80.0–100.0)
Platelets: 162 10*3/uL (ref 150–400)
RBC: 4.23 MIL/uL (ref 3.87–5.11)
RDW: 12.8 % (ref 11.5–15.5)
WBC: 9.4 10*3/uL (ref 4.0–10.5)
nRBC: 0 % (ref 0.0–0.2)

## 2021-10-16 LAB — RPR: RPR Ser Ql: NONREACTIVE

## 2021-10-16 MED ORDER — OXYTOCIN 10 UNIT/ML IJ SOLN
INTRAMUSCULAR | Status: AC
  Filled 2021-10-16: qty 2

## 2021-10-16 MED ORDER — LACTATED RINGERS IV BOLUS
1000.0000 mL | Freq: Once | INTRAVENOUS | Status: AC
  Administered 2021-10-16: 1000 mL via INTRAVENOUS

## 2021-10-16 MED ORDER — BENZOCAINE-MENTHOL 20-0.5 % EX AERO
1.0000 "application " | INHALATION_SPRAY | CUTANEOUS | Status: DC | PRN
  Administered 2021-10-17: 1 via TOPICAL
  Filled 2021-10-16: qty 56

## 2021-10-16 MED ORDER — LIDOCAINE HCL (PF) 1 % IJ SOLN
30.0000 mL | INTRAMUSCULAR | Status: DC | PRN
  Filled 2021-10-16: qty 30

## 2021-10-16 MED ORDER — METHYLERGONOVINE MALEATE 0.2 MG/ML IJ SOLN
INTRAMUSCULAR | Status: AC
  Administered 2021-10-16: 0.2 mg via INTRAMUSCULAR
  Filled 2021-10-16: qty 1

## 2021-10-16 MED ORDER — OXYCODONE-ACETAMINOPHEN 5-325 MG PO TABS: 2.0000 | ORAL_TABLET | ORAL | Status: DC | PRN

## 2021-10-16 MED ORDER — TETANUS-DIPHTH-ACELL PERTUSSIS 5-2.5-18.5 LF-MCG/0.5 IM SUSY
0.5000 mL | PREFILLED_SYRINGE | Freq: Once | INTRAMUSCULAR | Status: DC
  Filled 2021-10-16: qty 0.5

## 2021-10-16 MED ORDER — PHENYLEPHRINE 40 MCG/ML (10ML) SYRINGE FOR IV PUSH (FOR BLOOD PRESSURE SUPPORT)
80.0000 ug | PREFILLED_SYRINGE | INTRAVENOUS | Status: DC | PRN
  Filled 2021-10-16: qty 10

## 2021-10-16 MED ORDER — ONDANSETRON HCL 4 MG/2ML IJ SOLN
4.0000 mg | Freq: Four times a day (QID) | INTRAMUSCULAR | Status: DC | PRN
  Administered 2021-10-16: 4 mg via INTRAVENOUS
  Filled 2021-10-16: qty 2

## 2021-10-16 MED ORDER — TERBUTALINE SULFATE 1 MG/ML IJ SOLN: 0.2500 mg | Freq: Once | INTRAMUSCULAR | Status: DC | PRN

## 2021-10-16 MED ORDER — BUPIVACAINE HCL (PF) 0.25 % IJ SOLN
INTRAMUSCULAR | Status: DC | PRN
  Administered 2021-10-16: 3 mL via EPIDURAL
  Administered 2021-10-16: 4 mL via EPIDURAL

## 2021-10-16 MED ORDER — OXYCODONE-ACETAMINOPHEN 5-325 MG PO TABS: 1.0000 | ORAL_TABLET | ORAL | Status: DC | PRN

## 2021-10-16 MED ORDER — ACETAMINOPHEN 325 MG PO TABS: 650.0000 mg | ORAL_TABLET | ORAL | Status: DC | PRN

## 2021-10-16 MED ORDER — LACTATED RINGERS IV SOLN
125.0000 mL/h | INTRAVENOUS | Status: DC
  Administered 2021-10-16 (×2): 125 mL/h via INTRAVENOUS

## 2021-10-16 MED ORDER — DIBUCAINE (PERIANAL) 1 % EX OINT
1.0000 "application " | TOPICAL_OINTMENT | CUTANEOUS | Status: DC | PRN
  Administered 2021-10-17: 1 via RECTAL
  Filled 2021-10-16: qty 28

## 2021-10-16 MED ORDER — FENTANYL-BUPIVACAINE-NACL 0.5-0.125-0.9 MG/250ML-% EP SOLN
12.0000 mL/h | EPIDURAL | Status: DC | PRN
  Administered 2021-10-16: 12 mL/h via EPIDURAL

## 2021-10-16 MED ORDER — FENTANYL-BUPIVACAINE-NACL 0.5-0.125-0.9 MG/250ML-% EP SOLN
EPIDURAL | Status: AC
  Filled 2021-10-16: qty 250

## 2021-10-16 MED ORDER — EPHEDRINE 5 MG/ML INJ
10.0000 mg | INTRAVENOUS | Status: DC | PRN
  Filled 2021-10-16: qty 2

## 2021-10-16 MED ORDER — WITCH HAZEL-GLYCERIN EX PADS
1.0000 "application " | MEDICATED_PAD | CUTANEOUS | Status: DC | PRN
  Administered 2021-10-17: 1 via TOPICAL
  Filled 2021-10-16: qty 100

## 2021-10-16 MED ORDER — DIPHENHYDRAMINE HCL 25 MG PO CAPS: 25.0000 mg | ORAL_CAPSULE | Freq: Four times a day (QID) | ORAL | Status: DC | PRN

## 2021-10-16 MED ORDER — SENNOSIDES-DOCUSATE SODIUM 8.6-50 MG PO TABS
2.0000 | ORAL_TABLET | Freq: Every day | ORAL | Status: DC
  Administered 2021-10-17: 2 via ORAL
  Filled 2021-10-16: qty 2

## 2021-10-16 MED ORDER — DIPHENHYDRAMINE HCL 50 MG/ML IJ SOLN: 12.5000 mg | INTRAMUSCULAR | Status: DC | PRN

## 2021-10-16 MED ORDER — SIMETHICONE 80 MG PO CHEW: 80.0000 mg | CHEWABLE_TABLET | ORAL | Status: DC | PRN

## 2021-10-16 MED ORDER — OXYTOCIN-SODIUM CHLORIDE 30-0.9 UT/500ML-% IV SOLN
2.5000 [IU]/h | INTRAVENOUS | Status: DC
  Administered 2021-10-16: 2.5 [IU]/h via INTRAVENOUS
  Filled 2021-10-16: qty 500

## 2021-10-16 MED ORDER — ONDANSETRON HCL 4 MG/2ML IJ SOLN: 4.0000 mg | INTRAMUSCULAR | Status: DC | PRN

## 2021-10-16 MED ORDER — ACETAMINOPHEN 325 MG PO TABS
650.0000 mg | ORAL_TABLET | ORAL | Status: DC | PRN
  Administered 2021-10-16 – 2021-10-17 (×3): 650 mg via ORAL
  Filled 2021-10-16 (×3): qty 2

## 2021-10-16 MED ORDER — METHYLERGONOVINE MALEATE 0.2 MG PO TABS
0.2000 mg | ORAL_TABLET | ORAL | Status: DC | PRN
  Filled 2021-10-16: qty 1

## 2021-10-16 MED ORDER — IBUPROFEN 600 MG PO TABS
600.0000 mg | ORAL_TABLET | Freq: Four times a day (QID) | ORAL | Status: DC
  Administered 2021-10-16 – 2021-10-17 (×4): 600 mg via ORAL
  Filled 2021-10-16 (×4): qty 1

## 2021-10-16 MED ORDER — LACTATED RINGERS IV SOLN
500.0000 mL | INTRAVENOUS | Status: DC | PRN
  Administered 2021-10-16: 1000 mL via INTRAVENOUS

## 2021-10-16 MED ORDER — LIDOCAINE HCL (PF) 1 % IJ SOLN
INTRAMUSCULAR | Status: AC
  Filled 2021-10-16: qty 30

## 2021-10-16 MED ORDER — LIDOCAINE HCL (PF) 1 % IJ SOLN
INTRAMUSCULAR | Status: DC | PRN
  Administered 2021-10-16: 3 mL via SUBCUTANEOUS

## 2021-10-16 MED ORDER — COCONUT OIL OIL
1.0000 "application " | TOPICAL_OIL | Status: DC | PRN
  Administered 2021-10-16: 1 via TOPICAL
  Filled 2021-10-16 (×2): qty 120

## 2021-10-16 MED ORDER — PRENATAL MULTIVITAMIN CH
1.0000 | ORAL_TABLET | Freq: Every day | ORAL | Status: DC
  Administered 2021-10-17: 1 via ORAL
  Filled 2021-10-16: qty 1

## 2021-10-16 MED ORDER — OXYTOCIN-SODIUM CHLORIDE 30-0.9 UT/500ML-% IV SOLN
1.0000 m[IU]/min | INTRAVENOUS | Status: DC
  Administered 2021-10-16: 4 m[IU]/min via INTRAVENOUS

## 2021-10-16 MED ORDER — ONDANSETRON HCL 4 MG PO TABS
4.0000 mg | ORAL_TABLET | ORAL | Status: DC | PRN
  Filled 2021-10-16: qty 1

## 2021-10-16 MED ORDER — OXYTOCIN BOLUS FROM INFUSION
333.0000 mL | Freq: Once | INTRAVENOUS | Status: AC
  Administered 2021-10-16: 333 mL via INTRAVENOUS

## 2021-10-16 MED ORDER — ZOLPIDEM TARTRATE 5 MG PO TABS: 5.0000 mg | ORAL_TABLET | Freq: Every evening | ORAL | Status: DC | PRN

## 2021-10-16 MED ORDER — MISOPROSTOL 25 MCG QUARTER TABLET
50.0000 ug | ORAL_TABLET | ORAL | Status: DC | PRN
  Administered 2021-10-16 (×2): 50 ug via VAGINAL
  Filled 2021-10-16 (×2): qty 1
  Filled 2021-10-16: qty 2

## 2021-10-16 MED ORDER — METHYLERGONOVINE MALEATE 0.2 MG/ML IJ SOLN: 0.2000 mg | INTRAMUSCULAR | Status: DC | PRN

## 2021-10-16 MED ORDER — SOD CITRATE-CITRIC ACID 500-334 MG/5ML PO SOLN: 30.0000 mL | ORAL | Status: DC | PRN

## 2021-10-16 MED ORDER — MISOPROSTOL 200 MCG PO TABS: 800.0000 ug | ORAL_TABLET | Freq: Once | ORAL | Status: AC

## 2021-10-16 MED ORDER — BUTORPHANOL TARTRATE 1 MG/ML IJ SOLN: 1.0000 mg | INTRAMUSCULAR | Status: DC | PRN

## 2021-10-16 MED ORDER — LACTATED RINGERS IV SOLN
500.0000 mL | Freq: Once | INTRAVENOUS | Status: AC
  Administered 2021-10-16: 500 mL via INTRAVENOUS

## 2021-10-16 MED ORDER — METHYLERGONOVINE MALEATE 0.2 MG/ML IJ SOLN: 0.2000 mg | Freq: Once | INTRAMUSCULAR | Status: AC

## 2021-10-16 MED ORDER — MISOPROSTOL 200 MCG PO TABS
ORAL_TABLET | ORAL | Status: AC
  Administered 2021-10-16: 800 ug via RECTAL
  Filled 2021-10-16: qty 1

## 2021-10-16 MED ORDER — MISOPROSTOL 200 MCG PO TABS
ORAL_TABLET | ORAL | Status: AC
  Filled 2021-10-16: qty 4

## 2021-10-16 MED ORDER — AMMONIA AROMATIC IN INHA
RESPIRATORY_TRACT | Status: AC
  Filled 2021-10-16: qty 10

## 2021-10-16 NOTE — Anesthesia Preprocedure Evaluation (Addendum)
Anesthesia Evaluation  Patient identified by MRN, date of birth, ID band Patient awake    Reviewed: Allergy & Precautions, H&P , NPO status , Patient's Chart, lab work & pertinent test results  History of Anesthesia Complications Negative for: history of anesthetic complications  Airway Mallampati: III  TM Distance: >3 FB Neck ROM: full    Dental no notable dental hx.    Pulmonary neg pulmonary ROS,    Pulmonary exam normal breath sounds clear to auscultation       Cardiovascular Exercise Tolerance: Good negative cardio ROS Normal cardiovascular exam Rhythm:Regular Rate:Normal     Neuro/Psych Patient states that she has no neurological deficits in the lower extremities.  Some residual urinary incontinence issues likely related to labor/pushing.  Neuromuscular disease (foot drop)    GI/Hepatic negative GI ROS,   Endo/Other    Renal/GU  Bladder dysfunction      Musculoskeletal Scoliosis   Abdominal Normal abdominal exam  (+)   Peds  Hematology negative hematology ROS (+)   Anesthesia Other Findings h/o macrosomia in prior pregnancy, with vacuum-assistance, sustained foot drop and urethral trauma with last delivery, currently utilizing pessary for incontinence and seeing pelvic floor physical therapy during this pregnancy.   AMA  Post-dates pregnancy  Past Surgical History: 2006: WISDOM TOOTH EXTRACTION  BMI    Body Mass Index: 29.41 kg/m      Reproductive/Obstetrics (+) Pregnancy                            Anesthesia Physical  Anesthesia Plan  ASA: II  Anesthesia Plan: Epidural   Post-op Pain Management:    Induction:   PONV Risk Score and Plan:   Airway Management Planned:   Additional Equipment:   Intra-op Plan:   Post-operative Plan:   Informed Consent: I have reviewed the patients History and Physical, chart, labs and discussed the procedure including the  risks, benefits and alternatives for the proposed anesthesia with the patient or authorized representative who has indicated his/her understanding and acceptance.       Plan Discussed with: Anesthesiologist and CRNA  Anesthesia Plan Comments:         Anesthesia Quick Evaluation

## 2021-10-16 NOTE — Progress Notes (Signed)
Intrapartum Progress Note  S: Notes feeling more of the contractions in her back. Otherwise still fairly comfortable.   O: Blood pressure 97/79, pulse 81, temperature 98.7 F (37.1 C), temperature source Oral, resp. rate 12, height 5\' 3"  (1.6 m), weight 68.5 kg, last menstrual period 01/06/2021. Gen App: NAD, comfortable Abdomen: soft, gravid FHT: baseline 140 bpm.  Accels present.  Decels absent. Previously had a few late decelerations while up on birthing ball. moderate in degree variability.   Tocometer: contractions q 2-3 minutes Cervix:  Extremities: Nontender, no edema.  Pitocin: 8 mIU  Labs:  No new labs  Assessment:  1: SIUP at [redacted]w[redacted]d, postdates 2. AMA 3. H/o macrosomia and birth trauma  Plan:  1. Continue IOL with Pitocin 2. Pain management as desired.    [name], MD 10/16/2021 12:45 PM

## 2021-10-16 NOTE — H&P (Signed)
Obstetric History and Physical  Lisa Brady is a 35 y.o. G2P1001 with IUP at [redacted]w[redacted]d presenting for scheduled IOL for post-dates pregnancy. Patient states she has been having  rare contractions,  no  vaginal bleeding, intact membranes, with active fetal movement.    Of note, patient has h/o macrosomia in prior pregnancy, with vacuum-assistance, sustained foot drop and urethral trauma with last delivery, currently utilizing pessary for incontinence and seeing pelvic floor physical therapy during this pregnancy.  Current EFW does not indicate macrosomia.   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   Prenatal labs and studies: ABO, Rh: --/--/A POS (11/11 0031) Antibody: NEG (11/11 0031) Rubella: 1.05 (04/06 1510) RPR: Non Reactive (08/12 1134)  HBsAg: Negative (04/06 1510)  HIV: Non Reactive (04/06 1510)  TFT:DDUKGURK/-- (10/14 1613) 1 hr Glucola  normal (99) Genetic screening normal Anatomy US normal   Past Medical History:  Diagnosis Date   Dysplastic nevus 04/01/2021   right medial forearm, mod Atypia    Dysplastic nevus 04/01/2021   right inferior axilla, mild atypia    Dysplastic nevus 04/01/2021   right superior medial scapula, mild atypia     Foot drop    Hypermobility arthralgia    Maternal varicella, non-immune 11/20/2018   Scoliosis    Urinary incontinence     Past Surgical History:  Procedure Laterality Date   WISDOM TOOTH EXTRACTION  2006    OB History  Gravida Para Term Preterm AB Living  2 1 1     1   SAB IAB Ectopic Multiple Live Births        0 1    # Outcome Date GA Lbr Len/2nd Weight Sex Delivery Anes PTL Lv  2 Current           1 Term 07/05/19 [redacted]w[redacted]d / 03:37 4370 g M Vag-Spont EPI  LIV    Social History   Socioeconomic History   Marital status: Married    Spouse name: [name]   Number of children: 1   Years of education: Not on file   Highest education level: Not on file  Occupational History   Not on file  Tobacco Use   Smoking status: Never   Smokeless tobacco: Never  Vaping Use   Vaping Use: Never used  Substance and Sexual Activity   Alcohol use: Not Currently   Drug use: Never   Sexual activity: Yes  Other Topics Concern   Not on file  Social History Narrative   Lisa Brady born [birth year]   Social Determinants of Health   Financial Resource Strain: Not on file  Food Insecurity: Not on file  Transportation Needs: Not on file  Physical Activity: Not on file  Stress: Not on file  Social Connections: Not on file    Family History  Problem Relation  Age of Onset   Breast cancer Mother 25   Melanoma Mother    Hyperlipidemia Father    Hypertension Father    Hyperlipidemia Brother    Brain cancer Maternal Grandmother    Heart disease Maternal Grandfather    Lung cancer Paternal Grandmother    Lung cancer Paternal Grandfather    Hyperlipidemia Brother    Hyperlipidemia Brother    Hyperlipidemia Brother     Medications Prior to Admission  Medication Sig Dispense Refill Last Dose   cetirizine (ZYRTEC) 10 MG tablet Take 10 mg by mouth daily.   10/15/2021   Prenatal Vit-Fe Fumarate-FA (PRENATAL MULTIVITAMIN) TABS tablet Take 1 tablet by mouth daily at 12 noon. 90 tablet 3 10/15/2021     No Known Allergies  Review of Systems: Negative except for what is mentioned in HPI.  Physical Exam: BP 97/79 (BP Location: Left Arm)   Pulse 81   Temp 98.7 F (37.1 C) (Oral)   Resp 12   Ht 5\' 3"  (1.6 m)   Wt 68.5 kg   LMP 01/06/2021   BMI 26.75 kg/m  CONSTITUTIONAL: Well-developed, well-nourished female in no acute distress.  HENT:  Normocephalic, atraumatic, External right and left ear normal. Oropharynx is clear and moist EYES: Conjunctivae and EOM are normal. Pupils are equal, round, and reactive to light. No scleral icterus.  NECK: Normal range of motion, supple, no masses SKIN: Skin is warm and dry. No rash noted. Not diaphoretic. No erythema. No pallor. NEUROLOGIC: Alert and oriented to person, place, and time. Normal reflexes, muscle tone coordination. No cranial nerve deficit noted. PSYCHIATRIC: Normal mood and affect. Normal behavior. Normal judgment and thought content. CARDIOVASCULAR: Normal heart rate noted, regular rhythm RESPIRATORY: Effort and breath sounds normal, no problems with respiration noted ABDOMEN: Soft, nontender, nondistended, gravid. MUSCULOSKELETAL: Normal range of motion. No edema and no tenderness. 2+ distal pulses.  Cervical Exam: Dilatation 1.5 cm   Effacement 50%   Station -3   Presentation: cephalic FHT:  Baseline rate 125 bpm   Variability moderate  Accelerations present   Decelerations none Contractions: Every 2-6 mins   Pertinent Labs/Studies:   Results for orders placed or performed during the hospital encounter of 10/16/21 (from the past 24 hour(s))  CBC     Status: None   Collection Time: 10/16/21 12:31 AM  Result Value Ref Range   WBC 9.4 4.0 - 10.5 K/uL   RBC 4.23 3.87 - 5.11 MIL/uL   Hemoglobin 13.0 12.0 - 15.0 g/dL   HCT 37.9 36.0 - 46.0 %   MCV 89.6 80.0 - 100.0 fL   MCH 30.7 26.0 - 34.0 pg   MCHC 34.3 30.0 - 36.0 g/dL   RDW 12.8 11.5 - 15.5 %   Platelets 162 150 - 400 K/uL   nRBC 0.0 0.0 - 0.2 %  Type and screen      Status: None   Collection Time: 10/16/21 12:31 AM  Result Value Ref Range   ABO/RH(D) A POS    Antibody Screen NEG    Sample Expiration      10/19/2021,2359 Performed at [facility], [address], [place], [place] [address]     Imaging:   Patient Name: Lisa Brady DOB: [birth year] MRN: [id number]  ULTRASOUND REPORT  Location: [facility] Date of Service: 09/25/2021   Indications:growth/afi Findings:  Nelda Marseille intrauterine pregnancy is visualized with FHR at 138 BPM.   Biometrics give an (U/S) Gestational age of [redacted]w[redacted]d and an (U/S) EDD of 10/12/21;  this correlates with the clinically established Estimated Date of Delivery: 10/13/21.   Fetal presentation is Cephalic.  Placenta: anterior. Grade: 1 AFI: 14.3 cm  Growth percentile is 67.  AC percentile is 15. EFW: 3370g / 7lb7oz   Impression: 1. [redacted]w[redacted]d Viable Singleton Intrauterine pregnancy previously established criteria. 2. Growth is 67 %ile.  AFI is 14.3 cm.   Recommendations: 1.Clinical correlation with the patient's History and Physical Exam.  Vivien Rota  Henderson-Gainey    Assessment : Adreonna Yontz is a 35 y.o. G2P1001 at [redacted]w[redacted]d being admitted for induction of labor due to post-dates pregnancy. AMA status.  H/o fetal macrosomia with birth trauma.  Plan: Labor: Induction with Cytotec as ordered as per protocol. Analgesia as needed. FWB: Reassuring fetal heart tracing.  GBS negative Delivery plan: Hopeful for vaginal delivery    [name], MD [facility]

## 2021-10-16 NOTE — Lactation Note (Signed)
This note was copied from a baby's chart. Lactation Consultation Note  Patient Name: Girl Kristyl Athens [id number] Date: 10/16/2021 Reason for consult: L&D Initial assessment;Term Age:37 hours  Maternal Data Has patient been taught Hand Expression?: Yes Does the patient have breastfeeding experience prior to this delivery?: Yes How long did the patient breastfeed?: 2 wks  Feeding Mother's Current Feeding Choice: Breast Milk Baby has been latching to breast off and on, fussy, assisted mom with latching baby to left and right breast in cradle hold and football hold, baby rooting, mom shown how to sandwich breast to help baby latch, baby takes a few attempts to coordinate tongue and suck,but can latch well, just gets sleepy then pulls off and cries when stimulated.  Nursed consistently for approx 12 min both breasts   LATCH Score Latch: Grasps breast easily, tongue down, lips flanged, rhythmical sucking.  Audible Swallowing: A few with stimulation  Type of Nipple: Everted at rest and after stimulation  Comfort (Breast/Nipple): Soft / non-tender  Hold (Positioning): Assistance needed to correctly position infant at breast and maintain latch.  LATCH Score: 8   Lactation Tools Discussed/Used  Mom has [employer] and is allowed a employee pump, she requests the [employer] pump, I gave this to her and obtained a copy of insurance card and completed submission form and employee breast pump spreadsheet Interventions Interventions: Breast feeding basics reviewed;Assisted with latch;Skin to skin;Hand express;Adjust position;Support pillows;Education  Discharge Pump: Employee Pump;Personal WIC Program: No  Consult Status Consult Status: PRN    Ferol Luz 10/16/2021, 6:52 PM

## 2021-10-16 NOTE — Anesthesia Procedure Notes (Signed)
Epidural Patient location during procedure: OB Start time: 10/16/2021 1:34 PM End time: 10/16/2021 1:38 PM  Staffing Anesthesiologist: Iran Ouch, MD Resident/CRNA: Lia Foyer, CRNA Performed: resident/CRNA   Preanesthetic Checklist Completed: patient identified, IV checked, site marked, risks and benefits discussed, monitors and equipment checked, pre-op evaluation and timeout performed  Epidural Patient position: sitting Prep: ChloraPrep Patient monitoring: heart rate, continuous pulse ox and blood pressure Approach: midline Location: L4-L5 Injection technique: LOR air  Needle:  Needle type: Tuohy  Needle gauge: 18 G Needle length: 9 cm and 9 Needle insertion depth: 6 and 6.5 cm Catheter type: closed end flexible Catheter size: 20 Guage Catheter at skin depth: 11 cm Test dose: negative and Other (0.25% bupivicaine)  Assessment Events: blood not aspirated, injection not painful, no injection resistance, no paresthesia and negative IV test  Additional Notes   Patient tolerated the insertion well without complications.Reason for block:procedure for pain

## 2021-10-16 NOTE — Progress Notes (Signed)
  Cook's catheter placed.

## 2021-10-17 ENCOUNTER — Encounter: Payer: Self-pay | Admitting: Obstetrics and Gynecology

## 2021-10-17 LAB — CBC
HCT: 30 % — ABNORMAL LOW (ref 36.0–46.0)
Hemoglobin: 10.1 g/dL — ABNORMAL LOW (ref 12.0–15.0)
MCH: 30.6 pg (ref 26.0–34.0)
MCHC: 33.7 g/dL (ref 30.0–36.0)
MCV: 90.9 fL (ref 80.0–100.0)
Platelets: 138 10*3/uL — ABNORMAL LOW (ref 150–400)
RBC: 3.3 MIL/uL — ABNORMAL LOW (ref 3.87–5.11)
RDW: 12.8 % (ref 11.5–15.5)
WBC: 12.9 10*3/uL — ABNORMAL HIGH (ref 4.0–10.5)
nRBC: 0 % (ref 0.0–0.2)

## 2021-10-17 MED ORDER — IBUPROFEN 600 MG PO TABS
600.0000 mg | ORAL_TABLET | Freq: Four times a day (QID) | ORAL | 1 refills | Status: DC | PRN
Start: 2021-10-17 — End: 2022-01-20

## 2021-10-17 NOTE — Progress Notes (Signed)
Patient discharged home with family.  Discharge instructions, when to follow up, and prescriptions reviewed with patient.  Patient verbalized understanding. Patient will be escorted out by auxiliary.   

## 2021-10-17 NOTE — Progress Notes (Signed)
Post Partum Day # 1, s/p SVD  Subjective: no complaints, up ad lib, voiding, and tolerating PO.  Notes that she is finally able to ambulate after epidural wore off, not having any more leaking of urine when ambulating. Is noting some mild pain with breastfeeding. Has been seen by lactation consultant yesterday.   Objective: Temp:  [98.2 F (36.8 C)-99.1 F (37.3 C)] 98.2 F (36.8 C) (11/12 0811) Pulse Rate:  [70-114] 70 (11/12 0327) Resp:  [18-20] 20 (11/12 0811) BP: (80-155)/(42-94) 111/76 (11/12 0811) SpO2:  [97 %-99 %] 98 % (11/12 0811)  Physical Exam:  General: alert and no distress  Lungs: clear to auscultation bilaterally Breasts: normal appearance, no masses or tenderness Heart: regular rate and rhythm, S1, S2 normal, no murmur, click, rub or gallop Abdomen: soft, non-tender; bowel sounds normal; no masses,  no organomegaly Pelvis: Lochia: appropriate, Uterine Fundus: firm Extremities: DVT Evaluation: No evidence of DVT seen on physical exam. Negative Homan's sign. No cords or calf tenderness. No significant calf/ankle edema.    Recent Labs    10/16/21 0031 10/17/21 0308  HGB 13.0 10.1*  HCT 37.9 30.0*    Assessment/Plan: Doing well postpartum Breastfeeding  Contraception undecided. Mild anemia postpartum due to acute blood loss (postpartum hemorrhage). Stable.  Discussed future plans for h/o urinary incontinence and f/u with Urology/UroGyn postpartum. Possible d/c home later today or in a.m.    LOS: 1 day   [name], MD [facility] 10/17/2021 12:24 PM

## 2021-10-17 NOTE — Anesthesia Postprocedure Evaluation (Signed)
Anesthesia Post Note  Patient: Lisa Brady  Procedure(s) Performed: AN AD HOC LABOR EPIDURAL  Patient location during evaluation: [facility] Unit Anesthesia Type: Epidural Level of consciousness: awake, oriented, awake and alert and patient cooperative Pain management: pain level controlled Vital Signs Assessment: post-procedure vital signs reviewed and stable Respiratory status: spontaneous breathing, nonlabored ventilation and respiratory function stable Cardiovascular status: blood pressure returned to baseline Postop Assessment: no headache and no backache Anesthetic complications: no   No notable events documented.   Last Vitals:  Vitals:   10/17/21 0327 10/17/21 0811  BP: 91/62 111/76  Pulse: 70   Resp: 18 20  Temp: 36.8 C 36.8 C  SpO2: 99% 98%    Last Pain:  Vitals:   10/17/21 0900  TempSrc:   PainSc: 3                  Deno Etienne

## 2021-10-17 NOTE — Discharge Summary (Signed)
Postpartum Discharge Summary      Patient Name: Lisa Brady DOB: 01/22/1986 MRN: [id number]  Date of admission: 10/16/2021 Delivery date:10/16/2021  Delivering provider: [name]  Date of discharge: 10/17/2021  Admitting diagnosis: Post-dates pregnancy [O48.0] Intrauterine pregnancy: [redacted]w[redacted]d    Secondary diagnosis:  Active Problems:   Urinary incontinence   History of left foot drop   H/O macrosomia in infant in prior pregnancy, currently pregnant   AMA (advanced maternal age) multigravida 35+, first trimester   Post-dates pregnancy  Additional problems: None    Discharge diagnosis: Term Pregnancy Delivered and Anemia                                              Post partum procedures: None Augmentation: AROM, Pitocin, Cytotec, and IP Foley Complications: [id number] Hospital course: Induction of Labor With Vaginal Delivery   35y.o. yo G2P2002 at 439w3das admitted to the hospital 10/16/2021 for induction of labor.  Indication for induction: Postdates.  Patient had an uncomplicated labor course as follows: Membrane Rupture Time/Date: 12:41 PM ,10/16/2021   Delivery Method:Vaginal, Spontaneous  Episiotomy: None  Lacerations:  2nd degree  Details of delivery can be found in separate delivery note.  Patient had a routine postpartum course. Patient is discharged home 10/18/21.  Newborn Data: Birth date:10/16/2021  Birth time:2:53 PM  Gender:Female  Living status:Living  Apgars:8 ,9  Weight:4010 g   Magnesium Sulfate received: No BMZ received: No Rhophylac:No MMR:No T-DaP:Given prenatally Flu: Given prenatally Transfusion:No  Physical exam  Vitals:   10/16/21 2032 10/16/21 2312 10/17/21 0327 10/17/21 0811  BP: (!) 97/57 119/63 91/62 111/76  Pulse: 90 84 70   Resp: '18 18 18 20  ' Temp: 98.6 F (37 C) 98.5 F (36.9 C) 98.2 F (36.8 C) 98.2 F (36.8 C)  TempSrc: Oral Oral Oral Oral  SpO2: 97% 98% 99% 98%  Weight:      Height:        General: alert, cooperative, and no distress Lochia: appropriate Uterine Fundus: firm Incision: N/A DVT Evaluation: No evidence of DVT seen on physical exam. Negative Homan's sign. No cords or calf tenderness. No significant calf/ankle edema. Labs: Lab Results  Component Value Date   WBC 12.9 (H) 10/17/2021   HGB 10.1 (L) 10/17/2021   HCT 30.0 (L) 10/17/2021   MCV 90.9 10/17/2021   PLT 138 (L) 10/17/2021   CMP Latest Ref Rng & Units 09/17/2020  Glucose 70 - 99 mg/dL 109(H)  BUN 6 - 23 mg/dL 10  Creatinine 0.40 - 1.20 mg/dL 0.69  Sodium 135 - 145 mEq/L 137  Potassium 3.5 - 5.1 mEq/L 4.4  Chloride 96 - 112 mEq/L 102  CO2 19 - 32 mEq/L 29  Calcium 8.4 - 10.5 mg/dL 9.3  Total Protein 6.0 - 8.3 g/dL 7.4  Total Bilirubin 0.2 - 1.2 mg/dL 1.3(H)  Alkaline Phos 39 - 117 U/L 32(L)  AST 0 - 37 U/L 28  ALT 0 - 35 U/L 41(H)   EdFlavia Shippercore: Edinburgh Postnatal Depression Scale Screening Tool 10/16/2021  I have been able to laugh and see the funny side of things. (No Data)  I have looked forward with enjoyment to things. -  I have blamed myself unnecessarily when things went wrong. -  I have been anxious or worried for no good reason. -  I have felt  scared or panicky for no good reason. -  Things have been getting on top of me. -  I have been so unhappy that I have had difficulty sleeping. -  I have felt sad or miserable. -  I have been so unhappy that I have been crying. -  The thought of harming myself has occurred to me. Lisa Brady Postnatal Depression Scale Total -      After visit meds:  Allergies as of 10/17/2021   No Known Allergies      Medication List     TAKE these medications    cetirizine 10 MG tablet Commonly known as: ZYRTEC Take 10 mg by mouth daily.   ibuprofen 600 MG tablet Commonly known as: ADVIL Take 1 tablet (600 mg total) by mouth every 6 (six) hours as needed.   prenatal multivitamin Tabs tablet Take 1 tablet by mouth daily at 12  noon.         Discharge home in stable condition Infant Feeding: Breast Infant Disposition:home with mother Discharge instruction: per After Visit Summary and Postpartum booklet. Activity: Advance as tolerated. Pelvic rest for 6 weeks.  Diet: routine diet Anticipated Birth Control: Unsure Postpartum Appointment:6 weeks Additional Postpartum F/U: Postpartum Depression checkup in 2 weeks Future Appointments: Future Appointments  Date Time Provider [facility] [place]  11/06/2021  8:00 AM Letitia Libra T, PT [facility] None  11/20/2021  8:00 AM Letitia Libra T, PT [facility] None  12/04/2021  8:00 AM Willa Rough, PT [facility] None   Follow up Visit:  Follow-up Information     [name], MD Follow up.   Specialties: Obstetrics and Gynecology, Radiology Why: 2 weeks postpartum mood check televisit 6 weeks postpartum visit in office Contact information: [address] [place] [address] [phone]                     10/17/2021 [name], MD

## 2021-10-18 DIAGNOSIS — D62 Acute posthemorrhagic anemia: Secondary | ICD-10-CM

## 2021-11-03 ENCOUNTER — Telehealth (INDEPENDENT_AMBULATORY_CARE_PROVIDER_SITE_OTHER): Payer: No Typology Code available for payment source | Admitting: Obstetrics and Gynecology

## 2021-11-03 ENCOUNTER — Encounter: Payer: Self-pay | Admitting: Obstetrics and Gynecology

## 2021-11-03 DIAGNOSIS — M6289 Other specified disorders of muscle: Secondary | ICD-10-CM | POA: Diagnosis not present

## 2021-11-03 DIAGNOSIS — R32 Unspecified urinary incontinence: Secondary | ICD-10-CM | POA: Diagnosis not present

## 2021-11-03 DIAGNOSIS — Z1332 Encounter for screening for maternal depression: Secondary | ICD-10-CM | POA: Diagnosis not present

## 2021-11-03 NOTE — Patient Instructions (Signed)

## 2021-11-03 NOTE — Progress Notes (Signed)
    Virtual Visit via Video Note  I connected with Lisa Brady on 11/03/21 at 11:30 AM EST by a video enabled telemedicine application and verified that I am speaking with the correct person using two identifiers.  Location: Patient: Home Provider: Office   I discussed the limitations of evaluation and management by telemedicine and the availability of in person appointments. The patient expressed understanding and agreed to proceed.  History of Present Illness:    Lisa Brady is a 34 y.o. G89P2002 female who presents for a 2 week mood check/postpartum visit. She is 2 weeks postpartum following a spontaneous vaginal delivery. I have fully reviewed the prenatal and intrapartum course. The delivery was at 40.1 gestational weeks (IOL for postdates).  Anesthesia: epidural. Postpartum course has been uncomplicated. Baby's course has been uncomplicated. Baby is feeding by both breast (and pumping). Bleeding is noted to be light. Bowel function is abnormal: She has constipation, but it is controlled with medication. Bladder function is back to baseline.  EDPS score is 5.   Review of Systems Pertinent items noted in HPI and remainder of comprehensive ROS otherwise negative.  The following portions of the patient's history were reviewed and updated as appropriate: allergies, current medications, past family history, past medical history, past social history, past surgical history, and problem list.  Observations/Objective: Height 5\' 3"  (1.6 m), weight 127 lb (57.6 kg), currently breastfeeding.  Assessment and Plan: Postpartum state s/p SVD -doing well Lactating mother  - doing well History of pelvic floor dysfunction - referral to UroGyn History of urinary incontinence - referral to UroGyn  Follow Up Instructions:  RTC in 4 weeks for final postpartum visit.    I discussed the assessment and treatment plan with the patient. The patient was provided an opportunity to ask questions  and all were answered. The patient agreed with the plan and demonstrated an understanding of the instructions.   The patient was advised to call back or seek an in-person evaluation if the symptoms worsen or if the condition fails to improve as anticipated.  I provided 12 minutes of video -face-to-face time during this encounter.   [name], MD

## 2021-11-06 ENCOUNTER — Ambulatory Visit: Payer: No Typology Code available for payment source | Attending: Obstetrics and Gynecology

## 2021-11-06 ENCOUNTER — Other Ambulatory Visit: Payer: Self-pay

## 2021-11-06 DIAGNOSIS — M21372 Foot drop, left foot: Secondary | ICD-10-CM | POA: Insufficient documentation

## 2021-11-06 DIAGNOSIS — M62838 Other muscle spasm: Secondary | ICD-10-CM | POA: Insufficient documentation

## 2021-11-06 DIAGNOSIS — M533 Sacrococcygeal disorders, not elsewhere classified: Secondary | ICD-10-CM | POA: Insufficient documentation
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
## 2021-11-06 NOTE — Patient Instructions (Signed)
Pelvic Tilt With Pelvic Floor (Hook-Lying)        Lie with hips and knees bent. Squeeze pelvic floor and flatten low back while breathing out so that pelvis tilts. Repeat _3x10__ times. Do _1-3__ times a day.  Put a pillow under your hips in this position when doing this exercise to help decrease pressure in the pelvis when it feels "heavy".

## 2021-11-20 ENCOUNTER — Ambulatory Visit: Payer: No Typology Code available for payment source

## 2021-11-20 ENCOUNTER — Other Ambulatory Visit: Payer: Self-pay

## 2021-11-20 DIAGNOSIS — M21372 Foot drop, left foot: Secondary | ICD-10-CM

## 2021-11-20 DIAGNOSIS — M62838 Other muscle spasm: Secondary | ICD-10-CM

## 2021-11-20 DIAGNOSIS — M533 Sacrococcygeal disorders, not elsewhere classified: Secondary | ICD-10-CM

## 2021-11-20 NOTE — Therapy (Signed)
[place] MAIN [facility] SERVICES [address] [place], [place], [address] Phone: [phone]   Fax:  [phone]  Physical Therapy Treatment  The patient has been informed of current processes in place at Outpatient Rehab to protect patients from Covid-19 exposure including social distancing, schedule modifications, and new cleaning procedures. After discussing their particular risk with a therapist based on the patient's personal risk factors, the patient has decided to proceed with in-person therapy.   Patient Details  Name: Lisa Brady MRN: [id number] Date of Birth: 1986/02/02 No data recorded  Encounter Date: 11/20/2021   PT End of Session - 11/20/21 0906     Visit Number 41    Number of Visits 64    Date for PT Re-Evaluation 69/67/89   recert 02/10/09   Authorization Type [place]    Authorization - Visit Number 4    Authorization - Number of Visits 12    Progress Note Due on Visit 81    PT Start Time 0800    PT Stop Time 0900    PT Time Calculation (min) 60 min    Activity Tolerance Patient tolerated treatment well;No increased pain    Behavior During Therapy [facility] for tasks assessed/performed             Past Medical History:  Diagnosis Date   Dysplastic nevus 04/01/2021   right medial forearm, mod Atypia    Dysplastic nevus 04/01/2021   right inferior axilla, mild atypia    Dysplastic nevus 04/01/2021   right superior medial scapula, mild atypia    Foot drop    Hypermobility arthralgia    Maternal varicella, non-immune 11/20/2018   Scoliosis    Urinary incontinence     Past Surgical History:  Procedure Laterality Date   WISDOM TOOTH EXTRACTION  2006    There were no vitals filed for this visit.  Pelvic Floor Physical Therapy Treatment Note  SCREENING  Changes in medications, allergies, or medical history?: pregnant    SUBJECTIVE  Patient reports: She had a blocked milk duct this morning. She peed  in the shower this morning but has not been having much leakage otherwise. Has been just wearing period underwear because she is still bleeding some following the birth but not bad.   Precautions:  4 weeks Postpartum  Pain update:  Location of pain:  mid/upper back  (pubic symphysis) Current pain:  0/10 (0/10 with turning) Max pain:  0/10  (0/10) (fleeting "lightning crotch") Least pain:  0/10 (0/10) Nature of pain: tightness/achy  **no increased pain following treatment  Patient Goals: Control her bladder/not need depends.   OBJECTIVE  Changes in:   Observation:  Slight R anterior innominate rotation (from previous session)  R scapula protracted and winging>L (from prior session)  12/26/20: alignment not assessed before MMT performed, audble "pop" when performing MMT for L adduction and then pelvis assessed and appropriately aligned in supine.  01/09/21: Pt. Demonstrates R anterior rotation, L up-slip and LLD with LLE long was finally detected. **PSIS appear level in standing with 2/3 heel-lift in R shoe.   03/06/21: mild R anterior rotation, resolved following treatment.  03/20/21: L up-slip and R anterior/L posterior rotation, possible R out-flare.  **following treatment pubic symphysis level, up-slip corrected but R anterion rotation remaining.   04/17/21: R out-flare.  05/08/21: TTP to B IC and coccygeus. Sytrnge ~ 2/5 in standing and 2+/5 in supine at BOS. ~ 3+ following treatment in supine, Pt. Able to sense in  standing following treatment.   05/22/21: ASIS and PSIS appeared level in standing but in supine R ASIS low and ankles level (RLE is short-leg). At EOS ASIS level and RLE short (appropriate) in supine.  06/12/21: ASIS and PSIS appear level in standing.  06/26/21: R anterior rotation and L up-slip.  07/24/21: mild R anterior rotation/ tension into rotation.   08/07/21: ASIS and PSIS level, RLE slightly externally rotated in standing.   08/21/21: R anterior rotation  and L up-slip  09/04/21: pelvis well aligned, R thoracic curve most evident.  09/18/21: pelvis well aligned, R thoracic curve most evident.  10/09/21: Pelvis well aligned in standing and supine.  11/06/21: Pelvis aligned in Standing but R anterior innominate rotation in supine.  TODAY: 2 finger DR that narrows with chin-tuck but not with SLR.  ROM/Mobility:  Decreased scapular depression and downward rotation ROM in R>L scapula. Decreased L cervical rotation with painful end-feel. Decreased mobility at pubic symphysis and pain with pressure at L Pubic ramus  (from prior session)  Pt demonstrates decreased upward rotation ROM for R scapula due to spasm/ fascial tightness that is limiting proprioception and recruitment for effective downward scapular rotation strengthening. (from previous session)  07/24/21: decreased thoracic PIVM  09/04/32: decreased R thoracic rotation mobility and decreased PIVM in PA and R to L rotational direction at spinous processes T~2-10  09/18/21: decreased PA PIVM through T/L junction and slightly through upper thoracic spine.  Strength:  Muscle fatigue reached earlier on R>L with tall-kneeling/balance exercises. Imbalance surrounding R scapula not allowing Pt. To perform I's, Y's, T's W's well. (from prior session)  MMT LE  - L DF 4+/5 (different rater may account for difference but could be due to spasms in back since test performed prior to DN/manual) - R DF 5/5 (from prior session)  Strength/MMT:  LE MMT (12/26/20) LE MMT Left Right  Hip flex:  (L2) /5 /5  Hip ext: 5/5 5/5  Hip abd: 4+/5 5/5  Hip add: 4+/5 5/5  Hip IR 4/5 4+/5  Hip ER 5/5 5/5  *audible "pop" with mild discomfort noted with resisted L adduction.  (02-06-21) LE MMT Left Right  Hip flex:  (L2) /5 /5  Hip ext: -5/5 5/5  Hip abd: 5/5 5/5  Hip add: 4+/5 4/5!  Hip IR 4+/5 4+/5  Hip ER 4++/5 4++/5    06/12/21 LE MMT Left Right  Hip flex:  (L2) /5 /5  Hip ext: 5/5 5/5  Hip abd: 5/5  5/5  Hip add: 4+/5 4+/5  Hip IR 4+/5 4+/5  Hip ER 5-/5 5-/5    Deep-core strength/endurance sufficient to allow Pt. To perform toe-taps x5 in a row before rest needed.  As of 05/01/21: Pt. Demonstrated weakness in DF in fatiguing during BLE plantarflexion at 23 repetitions.  Pelvic floor: TTP through R posterior PR/PC and OI as well as 5 o'clock region of posterior fourchette. TTP to to L posterior fourchette at ~ 5 o'clock. Coordinated and able to keep probe in even at ~ 45 degrees. Increased force production when TA and obliques recruited. ~ 3+/5 strength and maximal endurance at ~ 8 seconds.  (from prior session)  1/7:  Strength ~ 2+/5 initially with TTP to anterior and posterior PR/PC as well as decreased fascial mobility along anterior/inferior aspect of bladder.  **following treatment Pt. Able to achieve 3++/5 with decreased recruitment of the posterior PR/PC and IC near coccyx remaining. (from previous session)  01/09/21: TTP to R>L coccygeus, OI, and IC near  coccyx. Strength ~ 3/5 and 3 sec. endurance with poor recruitment of posterior PFM.  **following treatment, strength ~ 3++/5 with much more squeeze from posterior PFM and ~ 8 sec. Hold.  04/03/21: TTP to B STP and bulbo as well as EUS and decreased mobility through 2nd layer fascia on R>L.  04/17/20: TTP to all except coccygeus on L and to R anterior and posterior PR/PC. 2/5 at BOS, Pt. demonstrated 3+/ 5 strength at EOS.  10/09/21: TTP to B PR/PC anteriorly and posteriorly as well as to B STP and L IC externally.   11/06/21: TTP to R Iliacus, Psoas, and TFL  TTP to B QL, TA, and obliques with longitudinal bands on each side above the ASIS.  Palpation:  Greatest tenderness with pressure on spinous processes T5 and L2. (from previous session)  02/06/21: TTP to R TFL, iliopsoas, vastus lateralis and rectus femoris  03/06/21: TTP to R Iliacus, Psoas, TFL, and vastus lateralis.  03/20/21:  TTP to R iliacus L QL, adductor brevis and  pectineus  04/17/21: TTP to B diaphragm.  05/22/21: TTP to L QL, R iliacus , TFL and lateral quads  06/12/21: TTP to B adductor magnus/longus  06/26/21: TTP to R OI and Piriformis, B QL.  07/24/21: TTP to R TFL with decreased fascial mobility.  08/07/21: TTP to R adductor brevis, gracilis, vastus intermedius, OI, Piriformis, and TFL  08/21/21: TTP to R>L iliacus and psoas and L>R QL  09/04/32: TTP to multifidus through ~ T4-8 on the R.  09/18/21: TTP to B coccygeus (palpated externally) and through multifidus near ~ T3-5 on L>R  10/02/21: TTP to B adductors and rectus abdominus near pubic symphysis  11/06/21: Mild TTP to B mons.  TODAY: TTP to B TA, QL, and obliques with longitudinal banding from ASIS B.   Gait Analysis: (from prior session) Gait speed: 1.34ms ; anterior pelvic tilt, knee hyperextension, heel strike - with cues pelvic neutral achieved, with appropriate arm swing and decreased heel strike.     INTERVENTIONS THIS SESSION:   Manual: Performed TP release and MFR to B TA, QL, and obliques to decrease spasm and pain, improve ability to narrow DR, and allow for improved balance of musculature for improved function and decreased symptoms.  Therex: Reviewed and practiced posterior pelvic tilts with addition of emphasis on drawing first the TA then the obliques in to decrease DR and begin strengthening the TA obliques to improve stability surrounding the low back and pelvis following delivery.  Total time:  653m.                               PT Short Term Goals - 09/18/21 0901       PT SHORT TERM GOAL #1   Title Patient will demonstrate improved pelvic alignment and balance of musculature surrounding the pelvis to facilitate decreased PFM spasms and decrease pressure on nerve roots to allow for optimal PFM function    Baseline Has L foot-drop and pain in L glutes/knee as well as inability to sense PFM and L posterior innominate rotation/spasms  surrounding pelvis.    Time 5    Period Weeks    Status Achieved    Target Date 11/26/19      PT SHORT TERM GOAL #2   Title Patient will demonstrate HEP x1 in the clinic to demonstrate understanding and proper form to allow for further improvement.    Baseline Pt. lacks knowledge  of therapeutic ecersise that will decrease Sx.    Time 5    Period Weeks    Status Achieved    Target Date 11/26/19      PT SHORT TERM GOAL #3   Title Pt. will be able to walk 250 Ft.with CGA and w/o assistive device to demonstrate improved balance/decreased instability and foot drop.    Baseline Pt. using RW to AMB due to L foot-drop and increased instability.    Time 5    Period Weeks    Status Achieved    Target Date 08/20/19      PT SHORT TERM GOAL #4   Title Patient will demonstrate a coordinated contraction, relaxation, and bulge of the pelvic floor muscles to demonstrate functional recruitment and motion and allow for further strengthening.    Baseline Pt. reports inability to sense the PFM for kegel or relaxation, having mixed UI    Time 5    Period Weeks    Status Achieved    Target Date 08/20/19      PT SHORT TERM GOAL #5   Title Pt. will be able to demonstrate tall/half kneeling exercises for 10-15 seconds without increased pelvic pressure/UI sensation.    Baseline Pt. has avoided half-kneeling exercises because they increased Sx.    Time 6    Period Weeks    Status On-going    Target Date 10/30/21               PT Long Term Goals - 09/18/21 0901       PT LONG TERM GOAL #1   Title Patient will report no episodes of UUI/SUI over the course of the prior two weeks to demonstrate improved functional ability.    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 As of 4/30: She had been doing better but then took a step backward when she was sick and vomiting a lot. As of 7/9: Pt. continues to have  decreased incontinence but had an episode of total bladder loss and UUI ~ 1-2 times/day still (worsened due to overuse/spasm of LB musculature. As of 11/12: She had one "big accident" and one "snexe-pee" accident over the week. As of 05/01/21: Pt. has had an increase in Sx. since becoming pregnant and has been fit for a pessary which she will begin using soon to prevent further UI worsening over the course of her pregnancy along with further PFM strengthening.  As of 07/10/21: no UUI and able to make it to the toilet before leakage, SUI with coughing occurred lately with colds    Time 10    Period Weeks    Status On-going    Target Date 10/30/21      PT LONG TERM GOAL #2   Title Patient will report no pain with intercourse to demonstrate improved functional ability.    Baseline having mild pain with initial penetration As of 1:29: had some pain at initial penetration and with deeper thrusting at ~ 3-4/10 but "not terrible" 10/17/20: has not attempted to know As of 05/01/21: Pt. had some radiating pain with pessary fitting even following release the prior Friday, reports "not as bad as it could've been".  07/10/21: no pain    Time 10    Period Weeks    Status Achieved    Target Date 07/10/21      PT LONG TERM GOAL #3   Title  Patient will score at or below 22/100 on the UDI and 27/100 on the UIQ to demonstrate a clinically meaningful decrease in disability and distress due to pelvic floor dysfunction.    Baseline UDI: 67, UIQ: 72/100 10-19-19: UDI 55.5, UIQ 76/100; POPDI 11/100 As of 1/29: UDI:33/100, UIQ: 6/100, POPDI:6/100. As of 4/30: Due to set-back from vomiting. As of 11/12: UDI: 17/100, UIQ: POPDI: 11/100,    Time 10    Period Weeks    Status Achieved    Target Date 08/22/20      PT LONG TERM GOAL #4   Title Pt. 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    Baseline Pt. requiring RW for  AMB due to L foot-drop and imbalance. As of 10/12: Pt. requiring some support (e.g. grocery cart) for > 20 min. of AMB or stairs.    Time 10    Period Weeks    Status Achieved    Target Date 11/25/20      PT LONG TERM GOAL #5   Title Pt. will improve in FOTO score by 10 points to demonstrate improved function.    Baseline FOTO PFDI Urinary: 29, Urinary Problem:54    Time 10    Period Weeks    Status On-going    Target Date 10/30/21      PT LONG TERM GOAL #6   Title Patient will demonstrate 4+/5 strength or better in the PF and surrounding musculature to show improved functional strength for childcare and other vocational and recreational activities.    Baseline PFM 3/5, As of 04/17/21: 3+/5.  07/10/21: will reassess next session    Time 10    Period Weeks    Status On-going    Target Date 10/30/21      PT LONG TERM GOAL #7   Title Pt will demo no R shoulder IR, levelled shoulder height, and no anterior rotation of R PSIS across 2 visits to demo IND with scoliosis HEP and pelvic stability.    Time 10    Period Weeks    Status On-going    Target Date 10/30/21                   Plan - 11/20/21 0906     Clinical Impression Statement Pt. Responded well to all interventions today, demonstrating improved ability to recruit and coordinate the TA and obliques to create tension across the DR as well as understanding and correct performance of all education and exercises provided today. They will continue to benefit from skilled physical therapy to work toward remaining goals and maximize function as well as decrease likelihood of symptom increase or recurrence.     PT Next Visit Plan Build strengthening routine. re-assess and plan for follow-up.    PT Home Exercise Plan day: Teapot, Side-planks, Planks, rows, scap retractions, (blue band). Mini-Marches with leg lift,  standing hip ABD and EXT, modified bridge, tall kneeling. lean-backs, calf raises.     B day: Child's pose V-sit/  single leg hamstring stretch,. Side stretch, Piriformis stretch, Door stretch,  stretch levator scap (neck) on R    Every day: Kegels 3-5x/day with long-hold and pelvic tilt with sneeze, laugh/cough kegel, vaginal weights, mult-direction stepping.        Corrective: Upper cervical rotation Tennis ball release added MET for R anterior for 1 week. Heel-lift for RLE. self external PFM release. Hip  IR/ER (new priority 09/04/21, walking, squats, cat/cow and scoliosis specific exercises)    Consulted and Agree with Plan of Care Patient             Patient will benefit from skilled therapeutic intervention in order to improve the following deficits and impairments:     Visit Diagnosis: Other muscle spasm  Sacrococcygeal disorders, not elsewhere classified  Foot drop, left     Problem List Patient Active Problem List   Diagnosis Date Noted   Acute post-hemorrhagic anemia 10/18/2021   Post-dates pregnancy 10/16/2021   Postpartum hemorrhage 10/16/2021   Choroid plexus cyst of fetus on prenatal ultrasound 05/29/2021   History of vacuum extraction assisted delivery 04/03/2021   H/O macrosomia in infant in prior pregnancy, currently pregnant 04/03/2021   AMA (advanced maternal age) multigravida 35+, first trimester 04/03/2021   Hypermobility arthralgia 02/25/2021   Positive urine pregnancy test 02/25/2021   Chronic pain of right knee 09/17/2020   Urinary incontinence 08/17/2019   History of left foot drop 08/17/2019   Willa Rough DPT, ATC Willa Rough, PT 11/20/2021, 9:17 AM  [place] [address] [place], [place], [address] Phone: [phone]   Fax:  [phone]  Name: Tanny Harnack MRN: [id number] Date of Birth: 1986/10/31

## 2021-11-23 ENCOUNTER — Encounter: Payer: Self-pay | Admitting: Obstetrics and Gynecology

## 2021-11-25 MED ORDER — SLYND 4 MG PO TABS: 1.0000 | ORAL_TABLET | Freq: Every day | ORAL | 3 refills | Status: DC

## 2021-11-27 ENCOUNTER — Ambulatory Visit (INDEPENDENT_AMBULATORY_CARE_PROVIDER_SITE_OTHER): Payer: No Typology Code available for payment source | Admitting: Obstetrics and Gynecology

## 2021-11-27 ENCOUNTER — Other Ambulatory Visit: Payer: Self-pay

## 2021-11-27 ENCOUNTER — Encounter: Payer: Self-pay | Admitting: Obstetrics and Gynecology

## 2021-11-27 DIAGNOSIS — L929 Granulomatous disorder of the skin and subcutaneous tissue, unspecified: Secondary | ICD-10-CM

## 2021-11-27 DIAGNOSIS — M6289 Other specified disorders of muscle: Secondary | ICD-10-CM

## 2021-11-27 DIAGNOSIS — R32 Unspecified urinary incontinence: Secondary | ICD-10-CM

## 2021-11-27 MED ORDER — SLYND 4 MG PO TABS
1.0000 | ORAL_TABLET | Freq: Every day | ORAL | 3 refills | Status: DC
  Filled 2021-11-27 (×2): qty 84, 84d supply, fill #0

## 2021-11-27 NOTE — Progress Notes (Signed)
° °  OBSTETRICS POSTPARTUM CLINIC PROGRESS NOTE  Subjective:     Lisa Brady is a 35 y.o. G35P2002 female who presents for a postpartum visit. She is 6 weeks postpartum following a spontaneous vaginal delivery. I have fully reviewed the prenatal and intrapartum course. The delivery was at 40.2 gestational weeks.  Anesthesia: epidural. Postpartum course has been well. Baby's course has been well. Baby is feeding by breast. Bleeding: patient has not not resumed menses, with No LMP recorded (lmp unknown). Bowel function is normal. Bladder function is normal. Patient is not sexually active. Contraception method desired is oral progesterone-only contraceptive. Postpartum depression screening: negative.  EDPS score is 3.   Notes that she has not yet heard back from Urology regarding her referral.   The following portions of the patient's history were reviewed and updated as appropriate: allergies, current medications, past family history, past medical history, past social history, past surgical history, and problem list.  Review of Systems Pertinent items noted in HPI and remainder of comprehensive ROS otherwise negative.   Objective:    BP 100/77    Pulse 95    Ht 5\' 3"  (1.6 m)    Wt 126 lb 3.2 oz (57.2 kg)    LMP  (LMP Unknown)    Breastfeeding Yes Comment: pumping   BMI 22.36 kg/m   General:  alert and no distress   Breasts:  inspection negative, no nipple discharge or bleeding, no masses or nodularity palpable  Lungs: clear to auscultation bilaterally  Heart:  regular rate and rhythm, S1, S2 normal, no murmur, click, rub or gallop  Abdomen: soft, non-tender; bowel sounds normal; no masses,  no organomegaly.     Vulva:  normal  Vagina: normal vagina, no discharge, exudate, lesion, or erythema. 1 cm granulation tissue noted just beyond hymenal ring at 6 o'clock.   Cervix:  no cervical motion tenderness and no lesions  Corpus: normal size, contour, position, consistency, mobility, non-tender   Adnexa:  normal adnexa and no mass, fullness, tenderness  Rectal Exam: Not performed.         Labs:  Lab Results  Component Value Date   HGB 10.1 (L) 10/17/2021    Assessment:   1. Postpartum care following vaginal delivery   2. Urinary incontinence in female   3. Pelvic floor dysfunction in female   4. Lactating mother   30. Granulation tissue      Plan:   1. Contraception: oral progesterone-only contraceptive.  2. Will f/u with referral for Urology.  3. Granulation tissue not bothersome to patient. If it does cause issues, can return for removal.  4. Follow up in: 6 months for  annual exam or as needed.    [name], MD [facility]

## 2021-11-27 NOTE — Patient Instructions (Signed)

## 2021-12-03 ENCOUNTER — Encounter: Payer: Self-pay | Admitting: Obstetrics and Gynecology

## 2021-12-03 DIAGNOSIS — R32 Unspecified urinary incontinence: Secondary | ICD-10-CM

## 2021-12-03 DIAGNOSIS — M6289 Other specified disorders of muscle: Secondary | ICD-10-CM

## 2021-12-04 ENCOUNTER — Ambulatory Visit: Payer: No Typology Code available for payment source

## 2021-12-04 ENCOUNTER — Other Ambulatory Visit: Payer: Self-pay

## 2021-12-04 DIAGNOSIS — M533 Sacrococcygeal disorders, not elsewhere classified: Secondary | ICD-10-CM

## 2021-12-04 DIAGNOSIS — M62838 Other muscle spasm: Secondary | ICD-10-CM

## 2021-12-04 DIAGNOSIS — M21372 Foot drop, left foot: Secondary | ICD-10-CM

## 2021-12-04 NOTE — Patient Instructions (Addendum)
By Brendolyn Patty, (~$20) A wonderful resource for both pregnant and post-partum women to help you understand how your posture and body mechanics can help you have decreased pain and improved stability both during and after delivery.  Excellent exercise program safe for antenatal and postnatal strengthening.   LISTEN TO YOUR BODY, work up to 10 sec.   Activate and draw up the pelvic floor and tuck pelvis slightly under by squeezing the lower tummy muscles and glutes. Hold up to 10 seconds, resting when the PF becomes tired. Repeat ___ times, ____ times per day.    Make sure the hips are both pointed forward and that the knee is not forward past the toes. Activate and draw up the pelvic floor and tuck pelvis slightly under by squeezing the lower tummy muscles and glutes. Hold up to 10 seconds, resting when the PF becomes tired. Repeat ___ times, ____ times per day.     Make sure the hips are both pointed forward and that the knee is not forward past the toes. Activate and draw up the pelvic floor and tuck pelvis slightly under by squeezing the lower tummy muscles and glutes. Finally, slowly swing arms in an alternating fashion to challenge your balance. Hold up to 10 seconds, resting when the PF becomes tired. Repeat ___ times, ____ times per day.

## 2021-12-04 NOTE — Therapy (Signed)
[place] MAIN [facility] SERVICES [address] [place], [place], [address] Phone: [phone]   Fax:  [phone]  Physical Therapy Treatment and Discharge Summary  The patient has been informed of current processes in place at Outpatient Rehab to protect patients from Covid-19 exposure including social distancing, schedule modifications, and new cleaning procedures. After discussing their particular risk with a therapist based on the patient's personal risk factors, the patient has decided to proceed with in-person therapy.  Patient Details  Name: Lisa Brady MRN: [id number] Date of Birth: Apr 12, 1986 No data recorded  Encounter Date: 12/04/2021   PT End of Session - 12/04/21 1131     Visit Number 23    Number of Visits 55    Date for PT Re-Evaluation 06/05/15   recert 0/1/09   Authorization Type [place]    Authorization - Visit Number 5    Authorization - Number of Visits 12    Progress Note Due on Visit 26    PT Start Time 0800    PT Stop Time 0900    PT Time Calculation (min) 60 min    Activity Tolerance Patient tolerated treatment well;No increased pain    Behavior During Therapy [facility] for tasks assessed/performed             Past Medical History:  Diagnosis Date   Dysplastic nevus 04/01/2021   right medial forearm, mod Atypia    Dysplastic nevus 04/01/2021   right inferior axilla, mild atypia    Dysplastic nevus 04/01/2021   right superior medial scapula, mild atypia    Foot drop    Hypermobility arthralgia    Maternal varicella, non-immune 11/20/2018   Scoliosis    Urinary incontinence     Past Surgical History:  Procedure Laterality Date   WISDOM TOOTH EXTRACTION  2006    There were no vitals filed for this visit.  Pelvic Floor Physical Therapy Treatment Note  SCREENING  Changes in medications, allergies, or medical history?: 6 weeks PP    SUBJECTIVE  Patient reports: She has started having a  little more fullness/POP Sx. The tilts help some. She has had more urgency and felt like she could have had an accident but she didn't. She is having a full bladder when she has urgency now. Having a little neck/shoulder pain from pumping/being on her phone. She had her 6 week exam last week and she had some tenderness where she tore this time. Has not tried intercourse yet. She is having a hard time getting the Obliques on without also engaging the PFM. She wore "regular underwear" the other day.  Precautions:  6 weeks Postpartum  Pain update:  Location of pain:  neck/shoulder  (pubic symphysis) Current pain:  1/10 (0/10 with turning) Max pain:  3/10  (0/10) (fleeting "lightning crotch") Least pain:  0/10 (0/10) Nature of pain: tightness/achy  **no increased pain following treatment  Patient Goals: Control her bladder/not need depends.   OBJECTIVE  Changes in:   Observation:  Slight R anterior innominate rotation (from previous session)  R scapula protracted and winging>L (from prior session)  12/26/20: alignment not assessed before MMT performed, audble "pop" when performing MMT for L adduction and then pelvis assessed and appropriately aligned in supine.  01/09/21: Pt. Demonstrates R anterior rotation, L up-slip and LLD with LLE long was finally detected. **PSIS appear level in standing with 2/3 heel-lift in R shoe.   03/06/21: mild R anterior rotation, resolved following treatment.  03/20/21: L  up-slip and R anterior/L posterior rotation, possible R out-flare.  **following treatment pubic symphysis level, up-slip corrected but R anterion rotation remaining.   04/17/21: R out-flare.  05/08/21: TTP to B IC and coccygeus. Sytrnge ~ 2/5 in standing and 2+/5 in supine at BOS. ~ 3+ following treatment in supine, Pt. Able to sense in standing following treatment.   05/22/21: ASIS and PSIS appeared level in standing but in supine R ASIS low and ankles level (RLE is short-leg). At EOS  ASIS level and RLE short (appropriate) in supine.  06/12/21: ASIS and PSIS appear level in standing.  06/26/21: R anterior rotation and L up-slip.  07/24/21: mild R anterior rotation/ tension into rotation.   08/07/21: ASIS and PSIS level, RLE slightly externally rotated in standing.   08/21/21: R anterior rotation and L up-slip  09/04/21: pelvis well aligned, R thoracic curve most evident.  09/18/21: pelvis well aligned, R thoracic curve most evident.  10/09/21: Pelvis well aligned in standing and supine.  11/06/21: Pelvis aligned in Standing but R anterior innominate rotation in supine.  11/20/21: 2 finger DR that narrows with chin-tuck but not with SLR.  ROM/Mobility:  Decreased scapular depression and downward rotation ROM in R>L scapula. Decreased L cervical rotation with painful end-feel. Decreased mobility at pubic symphysis and pain with pressure at L Pubic ramus  (from prior session)  Pt demonstrates decreased upward rotation ROM for R scapula due to spasm/ fascial tightness that is limiting proprioception and recruitment for effective downward scapular rotation strengthening. (from previous session)  07/24/21: decreased thoracic PIVM  09/04/32: decreased R thoracic rotation mobility and decreased PIVM in PA and R to L rotational direction at spinous processes T~2-10  09/18/21: decreased PA PIVM through T/L junction and slightly through upper thoracic spine.  Strength:  Muscle fatigue reached earlier on R>L with tall-kneeling/balance exercises. Imbalance surrounding R scapula not allowing Pt. To perform I's, Y's, T's W's well. (from prior session)  MMT LE  - L DF 4+/5 (different rater may account for difference but could be due to spasms in back since test performed prior to DN/manual) - R DF 5/5 (from prior session)  Strength/MMT:  LE MMT (12/26/20) LE MMT Left Right  Hip flex:  (L2) /5 /5  Hip ext: 5/5 5/5  Hip abd: 4+/5 5/5  Hip add: 4+/5 5/5  Hip IR 4/5 4+/5  Hip ER  5/5 5/5  *audible "pop" with mild discomfort noted with resisted L adduction.  (02-06-21) LE MMT Left Right  Hip flex:  (L2) /5 /5  Hip ext: -5/5 5/5  Hip abd: 5/5 5/5  Hip add: 4+/5 4/5!  Hip IR 4+/5 4+/5  Hip ER 4++/5 4++/5    06/12/21 LE MMT Left Right  Hip flex:  (L2) /5 /5  Hip ext: 5/5 5/5  Hip abd: 5/5 5/5  Hip add: 4+/5 4+/5  Hip IR 4+/5 4+/5  Hip ER 5-/5 5-/5    Deep-core strength/endurance sufficient to allow Pt. To perform toe-taps x5 in a row before rest needed.  As of 05/01/21: Pt. Demonstrated weakness in DF in fatiguing during BLE plantarflexion at 23 repetitions.  Pelvic floor: TTP through R posterior PR/PC and OI as well as 5 o'clock region of posterior fourchette. TTP to to L posterior fourchette at ~ 5 o'clock. Coordinated and able to keep probe in even at ~ 45 degrees. Increased force production when TA and obliques recruited. ~ 3+/5 strength and maximal endurance at ~ 8 seconds.  (from prior session)  1/7:  Strength ~ 2+/5 initially with TTP to anterior and posterior PR/PC as well as decreased fascial mobility along anterior/inferior aspect of bladder.  **following treatment Pt. Able to achieve 3++/5 with decreased recruitment of the posterior PR/PC and IC near coccyx remaining. (from previous session)  01/09/21: TTP to R>L coccygeus, OI, and IC near coccyx. Strength ~ 3/5 and 3 sec. endurance with poor recruitment of posterior PFM.  **following treatment, strength ~ 3++/5 with much more squeeze from posterior PFM and ~ 8 sec. Hold.  04/03/21: TTP to B STP and bulbo as well as EUS and decreased mobility through 2nd layer fascia on R>L.  04/17/20: TTP to all except coccygeus on L and to R anterior and posterior PR/PC. 2/5 at BOS, Pt. demonstrated 3+/ 5 strength at EOS.  10/09/21: TTP to B PR/PC anteriorly and posteriorly as well as to B STP and L IC externally.   11/06/21: TTP to R Iliacus, Psoas, and TFL  TTP to B QL, TA, and obliques with longitudinal bands on  each side above the ASIS.  12/04/21: TTP throughout all except L Bulbo. Greatest TTP anteriorly at PR/PC B and at [facility] externally. 2-/5 pre-treatment, 3-/5 post-treatment  Palpation:  Greatest tenderness with pressure on spinous processes T5 and L2. (from previous session)  02/06/21: TTP to R TFL, iliopsoas, vastus lateralis and rectus femoris  03/06/21: TTP to R Iliacus, Psoas, TFL, and vastus lateralis.  03/20/21:  TTP to R iliacus L QL, adductor brevis and pectineus  04/17/21: TTP to B diaphragm.  05/22/21: TTP to L QL, R iliacus , TFL and lateral quads  06/12/21: TTP to B adductor magnus/longus  06/26/21: TTP to R OI and Piriformis, B QL.  07/24/21: TTP to R TFL with decreased fascial mobility.  08/07/21: TTP to R adductor brevis, gracilis, vastus intermedius, OI, Piriformis, and TFL  08/21/21: TTP to R>L iliacus and psoas and L>R QL  09/04/32: TTP to multifidus through ~ T4-8 on the R.  09/18/21: TTP to B coccygeus (palpated externally) and through multifidus near ~ T3-5 on L>R  10/02/21: TTP to B adductors and rectus abdominus near pubic symphysis  11/06/21: Mild TTP to B mons.  11/20/21: TTP to B TA, QL, and obliques with longitudinal banding from ASIS B.   Gait Analysis: (from prior session) Gait speed: 1.21ms ; anterior pelvic tilt, knee hyperextension, heel strike - with cues pelvic neutral achieved, with appropriate arm swing and decreased heel strike.     INTERVENTIONS THIS SESSION:   Manual: Performed TP release and MFR to B PR/PC anteriorly and IC posteriorly on L B STP externally as well as to the EUS to decrease spasm and pain, improve ability to narrow DR, and allow for improved balance of musculature for improved function and decreased symptoms.  Therex: Reviewed and practiced tall kneeling to the endurance level of the PFM endurance/fatigue to improve functional endurance of the muscles and allow for decreased POP Sx.  Self-care: Educated on the BKelloggfor  returning to exercise PP and reviewed and updated goals and decided on POC to D/C to HEP at this time.   Total time:  638m.                                 PT Short Term Goals - 12/04/21 [id number]     PT SHORT TERM GOAL #1   Title Patient will demonstrate improved pelvic alignment  and balance of musculature surrounding the pelvis to facilitate decreased PFM spasms and decrease pressure on nerve roots to allow for optimal PFM function    Baseline Has L foot-drop and pain in L glutes/knee as well as inability to sense PFM and L posterior innominate rotation/spasms surrounding pelvis.    Time 5    Period Weeks    Status Achieved    Target Date 11/26/19      PT SHORT TERM GOAL #2   Title Patient will demonstrate HEP x1 in the clinic to demonstrate understanding and proper form to allow for further improvement.    Baseline Pt. lacks knowledge of therapeutic ecersise that will decrease Sx.    Time 5    Period Weeks    Status Achieved    Target Date 11/26/19      PT SHORT TERM GOAL #3   Title Pt. will be able to walk 250 Ft.with CGA and w/o assistive device to demonstrate improved balance/decreased instability and foot drop.    Baseline Pt. using RW to AMB due to L foot-drop and increased instability.    Time 5    Period Weeks    Status Achieved    Target Date 08/20/19      PT SHORT TERM GOAL #4   Title Patient will demonstrate a coordinated contraction, relaxation, and bulge of the pelvic floor muscles to demonstrate functional recruitment and motion and allow for further strengthening.    Baseline Pt. reports inability to sense the PFM for kegel or relaxation, having mixed UI    Time 5    Period Weeks    Status Achieved    Target Date 08/20/19      PT SHORT TERM GOAL #5   Title Pt. will be able to demonstrate tall/half kneeling exercises for 10-15 seconds without increased pelvic pressure/UI sensation.    Baseline Pt. has avoided half-kneeling exercises  because they increased Sx.    Time 6    Period Weeks    Status Achieved    Target Date 10/30/21               PT Long Term Goals - 12/04/21 0815       PT LONG TERM GOAL #1   Title Patient will report no episodes of UUI/SUI over the course of the prior two weeks to demonstrate improved functional ability.    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 As of 4/30: She had been doing better but then took a step backward when she was sick and vomiting a lot. As of 7/9: Pt. continues to have decreased incontinence but had an episode of total bladder loss and UUI ~ 1-2 times/day still (worsened due to overuse/spasm of LB musculature. As of 11/12: She had one "big accident" and one "snexe-pee" accident over the week. As of 05/01/21: Pt. has had an increase in Sx. since becoming pregnant and has been fit for a pessary which she will begin using soon to prevent further UI worsening over the course of her pregnancy along with further PFM strengthening.  As of 07/10/21: no UUI and able to make it to the toilet before leakage, SUI with coughing occurred lately with colds    Time 10    Period Weeks    Status Achieved    Target Date 10/30/21      PT LONG TERM GOAL #2   Title  Patient will report no pain with intercourse to demonstrate improved functional ability.    Baseline having mild pain with initial penetration As of 1:29: had some pain at initial penetration and with deeper thrusting at ~ 3-4/10 but "not terrible" 10/17/20: has not attempted to know As of 05/01/21: Pt. had some radiating pain with pessary fitting even following release the prior Friday, reports "not as bad as it could've been".  07/10/21: no pain    Time 10    Period Weeks    Status Achieved    Target Date 07/10/21      PT LONG TERM GOAL #3   Title Patient will score at or below 22/100 on the UDI and 27/100 on the UIQ to demonstrate a  clinically meaningful decrease in disability and distress due to pelvic floor dysfunction.    Baseline UDI: 67, UIQ: 72/100 10-19-19: UDI 55.5, UIQ 76/100; POPDI 11/100 As of 1/29: UDI:33/100, UIQ: 6/100, POPDI:6/100. As of 4/30: Due to set-back from vomiting. As of 11/12: UDI: 17/100, UIQ: POPDI: 11/100,    Time 10    Period Weeks    Status Achieved    Target Date 08/22/20      PT LONG TERM GOAL #4   Title Pt. 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    Baseline Pt. requiring RW for AMB due to L foot-drop and imbalance. As of 10/12: Pt. requiring some support (e.g. grocery cart) for > 20 min. of AMB or stairs.    Time 10    Period Weeks    Status Achieved    Target Date 11/25/20      PT LONG TERM GOAL #5   Title Pt. will improve in FOTO score by 10 points to demonstrate improved function.    Baseline FOTO PFDI Urinary: 29, Urinary Problem:54 As of 12/04/21: FOTO PFDI Urinary: 8, Urinary Problem:58    Time 10    Period Weeks    Status Partially Met    Target Date 10/30/21      PT LONG TERM GOAL #6   Title Patient will demonstrate 4+/5 strength or better in the PF and surrounding musculature to show improved functional strength for childcare and other vocational and recreational activities.    Baseline PFM 3/5, As of 04/17/21: 3+/5.  07/10/21: will reassess next session. As of 12/04/21: 2+/5 6 weeks PP.    Time 10    Period Weeks    Status Not Met    Target Date 10/30/21      PT LONG TERM GOAL #7   Title Pt will demo no R shoulder IR, levelled shoulder height, and no anterior rotation of R PSIS across 2 visits to demo IND with scoliosis HEP and pelvic stability.    Time 10    Period Weeks    Status Achieved    Target Date 10/30/21                   Plan - 12/04/21 1132     Clinical Impression Statement Pt. Has achieved nearly all of her goals, demonstrating no  UUI though she has some remaining urgency when her bladder is full. She demonstrates some remaining PFM spasms following her recent delivery but has retained the ability to sense and coordinate the PFM, though they are still weak. She demonstrates no severe pain and understanding of how to self-manage pain when it  does arise. She responded well to all interventions today, demonstrating decreased TTP and spas as wel as improved recruitment of the PFM with ~ 1 pt. improvement in demonstrated strength following treatment. In kneeling, she was able to feel less "open" when she practices a kegel, demonstrating appropriate coordination and ability to progress with functional strengthening. Pt. feels that she is doing well and we will D/C to HEP at this time.    PT Next Visit Plan Build strengthening routine. re-assess and plan for follow-up.    PT Home Exercise Plan day: [place], Side-planks, Planks, rows, scap retractions, (blue band). Mini-Marches with leg lift,  standing hip ABD and EXT, modified bridge, tall kneeling. lean-backs, calf raises.     B day: Child's pose V-sit/ single leg hamstring stretch,. Side stretch, Piriformis stretch, Door stretch,  stretch levator scap (neck) on R    Every day: Kegels 3-5x/day with long-hold and pelvic tilt with sneeze, laugh/cough kegel, vaginal weights, mult-direction stepping.        Corrective: Upper cervical rotation Tennis ball release added MET for R anterior for 1 week. Heel-lift for RLE. self external PFM release. Hip IR/ER (new priority 09/04/21, walking, squats, cat/cow and scoliosis specific exercises)    Consulted and Agree with Plan of Care Patient             Patient will benefit from skilled therapeutic intervention in order to improve the following deficits and impairments:     Visit Diagnosis: Other muscle spasm  Sacrococcygeal disorders, not elsewhere classified  Foot drop, left     Problem List Patient Active Problem List   Diagnosis Date  Noted   Acute post-hemorrhagic anemia 10/18/2021   Choroid plexus cyst of fetus on prenatal ultrasound 05/29/2021   History of vacuum extraction assisted delivery 04/03/2021   Hypermobility arthralgia 02/25/2021   Chronic pain of right knee 09/17/2020   Urinary incontinence 08/17/2019   History of left foot drop 08/17/2019   Willa Rough DPT, ATC Willa Rough, PT 12/04/2021, 12:08 PM  [place] [facility]IN [facility] SERVICES [address] [place], [place], [address] Phone: [phone]   Fax:  [phone]  Name: Zakyia Gagan MRN: [id number] Date of Birth: [birth year]

## 2021-12-08 ENCOUNTER — Other Ambulatory Visit: Payer: Self-pay

## 2021-12-14 DIAGNOSIS — Z3041 Encounter for surveillance of contraceptive pills: Secondary | ICD-10-CM

## 2021-12-16 ENCOUNTER — Other Ambulatory Visit: Payer: Self-pay

## 2021-12-16 MED ORDER — NORETHINDRONE 0.35 MG PO TABS
1.0000 | ORAL_TABLET | Freq: Every day | ORAL | 1 refills | Status: DC
  Filled 2021-12-16: qty 84, 84d supply, fill #0

## 2021-12-17 ENCOUNTER — Other Ambulatory Visit: Payer: Self-pay

## 2022-01-01 ENCOUNTER — Other Ambulatory Visit: Payer: Self-pay

## 2022-01-20 ENCOUNTER — Encounter: Payer: Self-pay | Admitting: Obstetrics and Gynecology

## 2022-01-20 ENCOUNTER — Other Ambulatory Visit: Payer: Self-pay

## 2022-01-20 ENCOUNTER — Ambulatory Visit (INDEPENDENT_AMBULATORY_CARE_PROVIDER_SITE_OTHER): Payer: No Typology Code available for payment source | Admitting: Obstetrics and Gynecology

## 2022-01-20 VITALS — BP 118/62 | HR 93 | Ht 63.0 in | Wt 126.0 lb

## 2022-01-20 DIAGNOSIS — R350 Frequency of micturition: Secondary | ICD-10-CM | POA: Diagnosis not present

## 2022-01-20 DIAGNOSIS — N8110 Cystocele, unspecified: Secondary | ICD-10-CM | POA: Diagnosis not present

## 2022-01-20 DIAGNOSIS — N393 Stress incontinence (female) (male): Secondary | ICD-10-CM

## 2022-01-20 LAB — POCT URINALYSIS DIPSTICK
Appearance: NORMAL
Bilirubin, UA: NEGATIVE
Blood, UA: NEGATIVE
Glucose, UA: NEGATIVE
Ketones, UA: NEGATIVE
Nitrite, UA: NEGATIVE
Protein, UA: NEGATIVE
Spec Grav, UA: 1.01 (ref 1.010–1.025)
Urobilinogen, UA: 0.2 E.U./dL
pH, UA: 6.5 (ref 5.0–8.0)
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
## 2022-01-23 ENCOUNTER — Encounter: Payer: Self-pay | Admitting: Obstetrics and Gynecology

## 2022-02-19 ENCOUNTER — Telehealth (INDEPENDENT_AMBULATORY_CARE_PROVIDER_SITE_OTHER): Payer: No Typology Code available for payment source | Admitting: Obstetrics and Gynecology

## 2022-02-19 ENCOUNTER — Other Ambulatory Visit: Payer: Self-pay

## 2022-02-19 DIAGNOSIS — N393 Stress incontinence (female) (male): Secondary | ICD-10-CM

## 2022-02-19 NOTE — Progress Notes (Signed)
[facility] ?Pre-Operative visit- Virtual ? ?This is a video visit. Patient is at home in [place] and I was present at the office. Only the patient was on the call.  ? ?Subjective ?Chief Complaint: Francoise Chojnowski presents for a preoperative encounter.  ? ?History of Present Illness: ?Shirleyann Montero is a 36 y.o. female who presents for preoperative visit.  She is scheduled to undergo cystoscopy with urethral bulking on 03/11/22.  Her symptoms include stress urinary incontinence, and she had a positive cough stress test.  ? ? ?Past Medical History:  ?Diagnosis Date  ? Dysplastic nevus 04/01/2021  ? right medial forearm, mod Atypia   ? Dysplastic nevus 04/01/2021  ? right inferior axilla, mild atypia   ? Dysplastic nevus 04/01/2021  ? right superior medial scapula, mild atypia   ? Foot drop   ? Hypermobility arthralgia   ? Maternal varicella, non-immune 11/20/2018  ? Scoliosis   ? Urinary incontinence   ?  ? ?Past Surgical History:  ?Procedure Laterality Date  ? [place] EXTRACTION  2006  ? ? ?has No Known Allergies.  ? ?Family History  ?Problem Relation Age of Onset  ? Breast cancer Mother 7  ? Melanoma Mother   ? Hyperlipidemia Father   ? Hypertension Father   ? Hyperlipidemia Brother   ? Brain cancer Maternal Grandmother   ? Heart disease Maternal Grandfather   ? Lung cancer Paternal Grandmother   ? Lung cancer Paternal Grandfather   ? Hyperlipidemia Brother   ? Hyperlipidemia Brother   ? Hyperlipidemia Brother   ? ? ?Social History  ? ?Tobacco Use  ? Smoking status: Never  ? Smokeless tobacco: Never  ?Vaping Use  ? Vaping Use: Never used  ?Substance Use Topics  ? Alcohol use: Yes  ?  Alcohol/week: 1.0 standard drink  ?  Types: 1 Glasses of wine per week  ? Drug use: Never  ? ? ? ?Review of Systems was negative for a full 10 system review except as noted in the History of Present Illness. ? ? ?Current Outpatient Medications:  ?  cetirizine (ZYRTEC) 10 MG tablet, Take 10 mg by mouth  daily., Disp: , Rfl:  ?  norethindrone (ORTHO MICRONOR) 0.35 MG tablet, Take 1 tablet (0.35 mg total) by mouth daily., Disp: 84 tablet, Rfl: 1 ?  Prenatal Vit-Fe Fumarate-FA (PRENATAL MULTIVITAMIN) TABS tablet, Take 1 tablet by mouth daily at 12 noon., Disp: 90 tablet, Rfl: 3  ? ?Objective ?AAO x3 ? ?Previous Pelvic Exam showed: ?CST: positive ?  ?Speculum exam reveals normal vaginal mucosa without atrophy. Cervix normal appearance. Uterus normal single, nontender. Adnexa no mass, fullness, tenderness.   ?  ?  ?Pelvic floor strength II/V ?  ?Pelvic floor musculature: Right levator non-tender, Right obturator non-tender, Left levator tender, Left obturator non-tender ?  ?POP-Q:  ?  ?POP-Q ?  ?-1  ?                                          Aa   ?-1 ?                                          Ba   ?-6  ?  C  ?  ?3  ?                                          Gh   ?4  ?                                          Pb   ?8  ?                                          tvl  ?  ?-2  ?                                          Ap   ?-2  ?                                          Bp   ?-7.5  ?                                            D  ?  ? ? ? ?Assessment/ Plan ? ?Assessment: ?The patient is a 36 y.o. year old scheduled to undergo cystoscopy with urethral bulking. Verbal consent was obtained for these procedures. ? ?Plan: ?General Surgical Consent: ?The patient has previously been counseled on alternative treatments, and the decision by the patient and provider was to proceed with the procedure listed above. ? ?For all procedures, there are risks of bleeding, infection, damage to surrounding organs including but not limited to bowel, bladder, blood vessels, ureters and nerves, and need for further surgery if an injury were to occur. These risks are all low with minimally invasive surgery. Very rare risks include blood transfusion, blood clot, heart attack, pneumonia, or death.  ? ?There  is also a risk of short-term postoperative urinary retention with need to use a catheter.  The risk of long-term need for a catheter is very low.  ? ? ?Pre-operative instructions:  She was instructed to not take Aspirin/NSAIDs x 7days prior to surgery. Antibiotic prophylaxis was ordered as indicated. ? ?Catheter use: Patient will go home with foley if needed after post-operative voiding trial. ? ?Post-operative instructions:  She was provided with specific post-operative instructions, including precautions and signs/symptoms for which we would recommend contacting us. ? ?Post-operative medications: No prescriptions needed. Instructed to take ibuprofen or tylenol OTC if needed ? ?Laboratory testing:  No labs required ? ?Preoperative clearance:  She does not require surgical clearance.   ? ?Post-operative follow-up:  A post-operative appointment will be made for 2-4 weeks from the date of surgery.  ? ?Patient will call the clinic or use MyChart should anything change or any new issues arise. ? ? ?Jaquita Folds, MD ? ? ?

## 2022-02-19 NOTE — Patient Instructions (Addendum)
Taking Care of Yourself after Urethral Bulking ? ?Drink plenty of water for a day or two following your procedure. Try to have about 8 ounces (one cup) at a time, and do this 6 times or more per day unless you have fluid restrictitons ?AVOID irritative beverages such as coffee, tea, soda, alcoholic or citrus drinks for a day or two, as this may cause burning with urination. ? For the first 1-2 days after the procedure, your urine may be pink or red in color. You may have some blood in your urine as a normal side effect of the procedure. Large amounts of bleeding or difficulty urinating are NOT normal. Call the nurse line if this happens or go to the nearest Emergency Room if the bleeding is heavy or you cannot urinate at all and it is after hours. If you need to be catheterized afterward, ask for a pediatric catheter to be used (size 10 or 12-French) so the material is not pushed out of place.   ?You may experience some discomfort or a burning sensation with urination after having this procedure. You can use over the counter Azo or pyridium to help with burning and follow the instructions on the packaging. If it does not improve within 1-2 days, or other symptoms appear (fever, chills, or difficulty urinating) call the office to speak to a nurse.  ?You may return to normal daily activities such as work, school, driving, exercising and housework on the day after the procedure. ? ? ? ?

## 2022-02-22 NOTE — H&P (Signed)
[facility] ?Pre-Operative H&P ? ? ? ?Subjective ?Chief Complaint: Lisa Brady presents for a preoperative encounter.  ? ?History of Present Illness: ?Lisa Brady is a 36 y.o. female who presents for preoperative visit.  She is scheduled to undergo cystoscopy with urethral bulking on 03/11/22.  Her symptoms include stress urinary incontinence, and she had a positive cough stress test.  ? ? ?Past Medical History:  ?Diagnosis Date  ? Dysplastic nevus 04/01/2021  ? right medial forearm, mod Atypia   ? Dysplastic nevus 04/01/2021  ? right inferior axilla, mild atypia   ? Dysplastic nevus 04/01/2021  ? right superior medial scapula, mild atypia   ? Foot drop   ? Hypermobility arthralgia   ? Maternal varicella, non-immune 11/20/2018  ? Scoliosis   ? Urinary incontinence   ?  ? ?Past Surgical History:  ?Procedure Laterality Date  ? [place] EXTRACTION  2006  ? ? ?has No Known Allergies.  ? ?Family History  ?Problem Relation Age of Onset  ? Breast cancer Mother 48  ? Melanoma Mother   ? Hyperlipidemia Father   ? Hypertension Father   ? Hyperlipidemia Brother   ? Brain cancer Maternal Grandmother   ? Heart disease Maternal Grandfather   ? Lung cancer Paternal Grandmother   ? Lung cancer Paternal Grandfather   ? Hyperlipidemia Brother   ? Hyperlipidemia Brother   ? Hyperlipidemia Brother   ? ? ?Social History  ? ?Tobacco Use  ? Smoking status: Never  ? Smokeless tobacco: Never  ?Vaping Use  ? Vaping Use: Never used  ?Substance Use Topics  ? Alcohol use: Yes  ?  Alcohol/week: 1.0 standard drink  ?  Types: 1 Glasses of wine per week  ? Drug use: Never  ? ? ? ?Review of Systems was negative for a full 10 system review except as noted in the History of Present Illness. ? ?No current facility-administered medications for this encounter. ? ?Current Outpatient Medications:  ?  cetirizine (ZYRTEC) 10 MG tablet, Take 10 mg by mouth daily., Disp: , Rfl:  ?  norethindrone (ORTHO MICRONOR) 0.35 MG tablet, Take  1 tablet (0.35 mg total) by mouth daily., Disp: 84 tablet, Rfl: 1 ?  Prenatal Vit-Fe Fumarate-FA (PRENATAL MULTIVITAMIN) TABS tablet, Take 1 tablet by mouth daily at 12 noon., Disp: 90 tablet, Rfl: 3  ? ?Objective ?AAO x3 ? ?Previous Pelvic Exam showed: ?CST: positive ?  ?Speculum exam reveals normal vaginal mucosa without atrophy. Cervix normal appearance. Uterus normal single, nontender. Adnexa no mass, fullness, tenderness.   ?  ?  ?Pelvic floor strength II/V ?  ?Pelvic floor musculature: Right levator non-tender, Right obturator non-tender, Left levator tender, Left obturator non-tender ?  ?POP-Q:  ?  ?POP-Q ?  ?-1  ?                                          Aa   ?-1 ?                                          Ba   ?-6  ?  C  ?  ?3  ?                                          Gh   ?4  ?                                          Pb   ?8  ?                                          tvl  ?  ?-2  ?                                          Ap   ?-2  ?                                          Bp   ?-7.5  ?                                            D  ?  ? ? ? ?Assessment/ Plan ? ?Assessment: ?The patient is a 36 y.o. year old with stress urinary incontinence scheduled to undergo cystoscopy with urethral bulking.  ? ?Jaquita Folds, MD ? ? ? ?

## 2022-03-04 ENCOUNTER — Encounter (HOSPITAL_BASED_OUTPATIENT_CLINIC_OR_DEPARTMENT_OTHER): Payer: Self-pay | Admitting: Obstetrics and Gynecology

## 2022-03-04 ENCOUNTER — Other Ambulatory Visit: Payer: Self-pay

## 2022-03-04 NOTE — Progress Notes (Addendum)
Pt has current respiratory symptoms (cough, runny nose, and sneezing). Per respiratory guidelines, she must be symptom free for minimum two weeks prior to sx. Dr. Wannetta Sender notified that patient must be re-scheduled. Patient notified as well and informed that office will contact to re-schedule. ?

## 2022-03-04 NOTE — Progress Notes (Signed)
Spoke w/ via phone for pre-op interview---pt ?Lab needs dos----   urine poct preg            ?Lab results------none ?COVID test -----drive  up covid test 02-07-2022 @ 1500 pm [facility] ta[place]on aware will be in [place] and will call [phone] upon arrival ?Arrive at -------825 am 03-11-2022 ?NPO after MN NO Solid Food.  Clear liquids from MN until---725 am ?Med rec completed ?Medications to take morning of surgery -----none ?Diabetic medication -----n/a ?Patient instructed no nail polish to be worn day of surgery ?Patient instructed to bring photo id and insurance card day of surgery ?Patient aware to have Driver (ride ) / caregiver spouse aaron will stay    for 24 hours after surgery  ?Patient Special Instructions -----none ?Pre-Op special Istructions -----none ?Patient verbalized understanding of instructions that were given at this phone interview. ?Patient denies shortness of breath, chest pain, fever, cough at this phone interview.  ? ?Spoke with dr r fitzgerald mda and pt to have pcr covid test due to cough and runny nose and nonproductive cough since [phone], pt reports voice was hoarse Friday march 24th 2023 thru Monday march 27th  2023, now voice is returning normal. ?

## 2022-03-10 ENCOUNTER — Other Ambulatory Visit (HOSPITAL_COMMUNITY): Payer: Self-pay

## 2022-03-10 ENCOUNTER — Ambulatory Visit (INDEPENDENT_AMBULATORY_CARE_PROVIDER_SITE_OTHER): Payer: No Typology Code available for payment source | Admitting: Primary Care

## 2022-03-10 ENCOUNTER — Encounter (HOSPITAL_BASED_OUTPATIENT_CLINIC_OR_DEPARTMENT_OTHER): Payer: Self-pay | Admitting: Obstetrics and Gynecology

## 2022-03-10 ENCOUNTER — Encounter: Payer: Self-pay | Admitting: Primary Care

## 2022-03-10 VITALS — BP 116/78 | HR 92 | Ht 63.0 in | Wt 125.0 lb

## 2022-03-10 DIAGNOSIS — Z3041 Encounter for surveillance of contraceptive pills: Secondary | ICD-10-CM | POA: Diagnosis not present

## 2022-03-10 DIAGNOSIS — N393 Stress incontinence (female) (male): Secondary | ICD-10-CM | POA: Diagnosis not present

## 2022-03-10 DIAGNOSIS — M419 Scoliosis, unspecified: Secondary | ICD-10-CM | POA: Diagnosis not present

## 2022-03-10 DIAGNOSIS — N3946 Mixed incontinence: Secondary | ICD-10-CM | POA: Diagnosis not present

## 2022-03-10 DIAGNOSIS — Z0000 Encounter for general adult medical examination without abnormal findings: Secondary | ICD-10-CM

## 2022-03-10 DIAGNOSIS — Z20822 Contact with and (suspected) exposure to covid-19: Secondary | ICD-10-CM | POA: Diagnosis not present

## 2022-03-10 LAB — SARS CORONAVIRUS 2 BY RT PCR (HOSPITAL ORDER, PERFORMED IN ~~LOC~~ HOSPITAL LAB): SARS Coronavirus 2: NEGATIVE

## 2022-03-10 MED ORDER — NORETHINDRONE ACET-ETHINYL EST 1.5-30 MG-MCG PO TABS
1.0000 | ORAL_TABLET | Freq: Every day | ORAL | 3 refills | Status: DC
  Filled 2022-03-10 – 2022-03-11 (×2): qty 84, 84d supply, fill #0

## 2022-03-10 NOTE — Assessment & Plan Note (Signed)
Switched birth control from progesterone only to estrogen progesterone combination  Previously managed on Loestrin 1.5-30, refill sent to pharmacy. ? ? ?

## 2022-03-10 NOTE — Progress Notes (Signed)
? ?Subjective:  ? ? Patient ID: Lisa Brady, female    DOB: [birth year], 36 y.o.   MRN: [id number] ? ?HPI ? ?[name] is a very pleasant 36 y.o. female who presents today for complete physical and follow up of chronic conditions. ? ?She is pending cystoscopy with urethral bulking per GYN on 03/11/2022 for chronic stress urinary incontinence.  She has participated in pelvic floor PT over the last 2 years with improvement in urinary urgency but continuation of stress urinary leakage.  ? ?She is ready to change her OCP back to a combined estrogen progesterone pill.  She has been managed on Micronor since postpartum with her last child. ? ?Immunizations: ?-Tetanus: 2022 ?-Influenza: Completed last season  ?-Covid-19: Completed last Fall ? ?Diet: Healthy diet.  ?Exercise: She is walking some, recently began pilates video.  ? ?Eye exam: Completes annually  ?Dental exam: Completes semi-annually  ? ?Pap Smear: Completed in 2019, due follows GYN. ? ?BP Readings from Last 3 Encounters:  ?03/10/22 116/78  ?01/20/22 118/62  ?11/27/21 100/77  ? ? ? ? ? ? ? ?Review of Systems  ?Constitutional:  Negative for unexpected weight change.  ?HENT:  Negative for rhinorrhea.   ?Eyes:  Negative for visual disturbance.  ?Respiratory:  Negative for shortness of breath.   ?Cardiovascular:  Negative for chest pain.  ?Gastrointestinal:  Negative for constipation and diarrhea.  ?Genitourinary:  Negative for difficulty urinating.  ?     Stress incontinence   ?Musculoskeletal:  Negative for arthralgias and myalgias.  ?Skin:  Negative for rash.  ?Allergic/Immunologic: Negative for environmental allergies.  ?Neurological:  Negative for dizziness and headaches.  ?Psychiatric/Behavioral:  The patient is not nervous/anxious.   ? ?   ? ? ?Past Medical History:  ?Diagnosis Date  ? Choroid plexus cyst of fetus on prenatal ultrasound 05/29/2021  ? Dysplastic nevus 04/01/2021  ? right medial forearm, mod Atypia   ? Dysplastic nevus 04/01/2021  ?  right inferior axilla, mild atypia   ? Dysplastic nevus 04/01/2021  ? right superior medial scapula, mild atypia   ? Foot drop   ? History of left foot drop 08/17/2019  ? History of vacuum extraction assisted delivery 04/03/2021  ? Hypermobility arthralgia   ? Maternal varicella, non-immune 11/20/2018  ? Scoliosis   ? Urinary incontinence   ? Wears glasses   ? ? ?Social History  ? ?Socioeconomic History  ? Marital status: Married  ?  Spouse name: [name]  ? Number of children: 1  ? Years of education: Not on file  ? Highest education level: Not on file  ?Occupational History  ? Not on file  ?Tobacco Use  ? Smoking status: Never  ? Smokeless tobacco: Never  ?Vaping Use  ? Vaping Use: Never used  ?Substance and Sexual Activity  ? Alcohol use: Yes  ?  Alcohol/week: 1.0 standard drink  ?  Types: 1 Glasses of wine per week  ? Drug use: Never  ? Sexual activity: Yes  ?  Birth control/protection: Pill  ?Other Topics Concern  ? Not on file  ?Social History Narrative  ? [place] born [birth year]  ? ?Social Determinants of Health  ? ?Financial Resource Strain: Not on file  ?Food Insecurity: Not on file  ?Transportation Needs: Not on file  ?Physical Activity: Not on file  ?Stress: Not on file  ?Social Connections: Not on file  ?Intimate Partner Violence: Not on file  ? ? ?Past Surgical History:  ?Procedure Laterality Date  ? WISDOM TOOTH  EXTRACTION  2006  ? ? ?Family History  ?Problem Relation Age of Onset  ? Breast cancer Mother 34  ? Melanoma Mother   ? Hyperlipidemia Father   ? Hypertension Father   ? Hyperlipidemia Brother   ? Brain cancer Maternal Grandmother   ? Heart disease Maternal Grandfather   ? Lung cancer Paternal Grandmother   ? Lung cancer Paternal Grandfather   ? Hyperlipidemia Brother   ? Hyperlipidemia Brother   ? Hyperlipidemia Brother   ? ? ?No Known Allergies ? ?Current Outpatient Medications on File Prior to Visit  ?Medication Sig Dispense Refill  ? cetirizine (ZYRTEC) 10 MG tablet Take 10 mg by mouth at bedtime.    ?  Prenatal Vit-Fe Fumarate-FA (PRENATAL MULTIVITAMIN) TABS tablet Take 1 tablet by mouth daily at 12 noon. 90 tablet 3  ? ?No current facility-administered medications on file prior to visit.  ? ? ?BP 116/78   Pulse 92   Ht '5\' 3"'$  (1.6 m)   Wt 125 lb (56.7 kg)   LMP  (LMP Unknown)   SpO2 98%   BMI 22.14 kg/m?  ?Objective:  ? Physical Exam ?HENT:  ?   Right Ear: Tympanic membrane and ear canal normal.  ?   Left Ear: Tympanic membrane and ear canal normal.  ?   Nose: Nose normal.  ?Eyes:  ?   Conjunctiva/sclera: Conjunctivae normal.  ?   Pupils: Pupils are equal, round, and reactive to light.  ?Neck:  ?   Thyroid: No thyromegaly.  ?Cardiovascular:  ?   Rate and Rhythm: Normal rate and regular rhythm.  ?   Heart sounds: No murmur heard. ?Pulmonary:  ?   Effort: Pulmonary effort is normal.  ?   Breath sounds: Normal breath sounds. No rales.  ?Abdominal:  ?   General: Bowel sounds are normal.  ?   Palpations: Abdomen is soft.  ?   Tenderness: There is no abdominal tenderness.  ?Musculoskeletal:     ?   General: Normal range of motion.  ?   Cervical back: Neck supple.  ?Lymphadenopathy:  ?   Cervical: No cervical adenopathy.  ?Skin: ?   General: Skin is warm and dry.  ?   Findings: No rash.  ?Neurological:  ?   Mental Status: She is alert and oriented to person, place, and time.  ?   Cranial Nerves: No cranial nerve deficit.  ?   Deep Tendon Reflexes: Reflexes are normal and symmetric.  ?Psychiatric:     ?   Mood and Affect: Mood normal.  ? ? ? ? ? ?   ?Assessment & Plan:  ? ? ? ? ?This visit occurred during the SARS-CoV-2 public health emergency.  Safety protocols were in place, including screening questions prior to the visit, additional usage of staff PPE, and extensive cleaning of exam room while observing appropriate contact time as indicated for disinfecting solutions.  ?

## 2022-03-10 NOTE — Patient Instructions (Signed)
It was a pleasure to see you today! ? ?Preventive Care 70-38 Years Old, Female ?Preventive care refers to lifestyle choices and visits with your health care provider that can promote health and wellness. Preventive care visits are also called wellness exams. ?What can I expect for my preventive care visit? ?Counseling ?During your preventive care visit, your health care provider may ask about your: ?Medical history, including: ?Past medical problems. ?Family medical history. ?Pregnancy history. ?Current health, including: ?Menstrual cycle. ?Method of birth control. ?Emotional well-being. ?Home life and relationship well-being. ?Sexual activity and sexual health. ?Lifestyle, including: ?Alcohol, nicotine or tobacco, and drug use. ?Access to firearms. ?Diet, exercise, and sleep habits. ?Work and work Statistician. ?Sunscreen use. ?Safety issues such as seatbelt and bike helmet use. ?Physical exam ?Your health care provider may check your: ?Height and weight. These may be used to calculate your BMI (body mass index). BMI is a measurement that tells if you are at a healthy weight. ?Waist circumference. This measures the distance around your waistline. This measurement also tells if you are at a healthy weight and may help predict your risk of certain diseases, such as type 2 diabetes and high blood pressure. ?Heart rate and blood pressure. ?Body temperature. ?Skin for abnormal spots. ?What immunizations do I need? ?Vaccines are usually given at various ages, according to a schedule. Your health care provider will recommend vaccines for you based on your age, medical history, and lifestyle or other factors, such as travel or where you work. ?What tests do I need? ?Screening ?Your health care provider may recommend screening tests for certain conditions. This may include: ?Pelvic exam and Pap test. ?Lipid and cholesterol levels. ?Diabetes screening. This is done by checking your blood sugar (glucose) after you have not  eaten for a while (fasting). ?Hepatitis B test. ?Hepatitis C test. ?HIV (human immunodeficiency virus) test. ?STI (sexually transmitted infection) testing, if you are at risk. ?BRCA-related cancer screening. This may be done if you have a family history of breast, ovarian, tubal, or peritoneal cancers. ?Talk with your health care provider about your test results, treatment options, and if necessary, the need for more tests. ?Follow these instructions at home: ?Eating and drinking ? ?Eat a healthy diet that includes fresh fruits and vegetables, whole grains, lean protein, and low-fat dairy products. ?Take vitamin and mineral supplements as recommended by your health care provider. ?Do not drink alcohol if: ?Your health care provider tells you not to drink. ?You are pregnant, may be pregnant, or are planning to become pregnant. ?If you drink alcohol: ?Limit how much you have to 0-1 drink a day. ?Know how much alcohol is in your drink. In the [place] one drink equals one 12 oz bottle of beer (355 mL), one 5 oz glass of wine (148 mL), or one 1? oz glass of hard liquor (44 mL). ?Lifestyle ?Brush your teeth every morning and night with fluoride toothpaste. Floss one time each day. ?Exercise for at least 30 minutes 5 or more days each week. ?Do not use any products that contain nicotine or tobacco. These products include cigarettes, chewing tobacco, and vaping devices, such as e-cigarettes. If you need help quitting, ask your health care provider. ?Do not use drugs. ?If you are sexually active, practice safe sex. Use a condom or other form of protection to prevent STIs. ?If you do not wish to become pregnant, use a form of birth control. If you plan to become pregnant, see your health care provider for a prepregnancy visit. ?Find  healthy ways to manage stress, such as: ?Meditation, yoga, or listening to music. ?Journaling. ?Talking to a trusted person. ?Spending time with friends and family. ?Minimize exposure to UV  radiation to reduce your risk of skin cancer. ?Safety ?Always wear your seat belt while driving or riding in a vehicle. ?Do not drive: ?If you have been drinking alcohol. Do not ride with someone who has been drinking. ?If you have been using any mind-altering substances or drugs. ?While texting. ?When you are tired or distracted. ?Wear a helmet and other protective equipment during sports activities. ?If you have firearms in your house, make sure you follow all gun safety procedures. ?Seek help if you have been physically or sexually abused. ?What's next? ?Go to your health care provider once a year for an annual wellness visit. ?Ask your health care provider how often you should have your eyes and teeth checked. ?Stay up to date on all vaccines. ?This information is not intended to replace advice given to you by your health care provider. Make sure you discuss any questions you have with your health care provider. ?Document Revised: 05/20/2021 Document Reviewed: 05/20/2021 ?Elsevier Patient Education ? [place]. ? ?

## 2022-03-10 NOTE — Assessment & Plan Note (Signed)
Immunizations up-to-date. ? ?Pap smear due, patient declines today but will schedule to have this updated soon. ? ?Encouraged healthy diet regular exercise. ? ?Exam today unremarkable. ?Labs pending per [facility]. She will provide [place] a copy. ?

## 2022-03-10 NOTE — Assessment & Plan Note (Signed)
Following with URO/GYN, is pending cystoscopy with urethral bulking on 03/11/22. ? ? ?

## 2022-03-10 NOTE — Anesthesia Preprocedure Evaluation (Addendum)
Anesthesia Evaluation  ?Patient identified by MRN, date of birth, ID band ?Patient awake ? ? ? ?Reviewed: ?Allergy & Precautions, NPO status , Patient's Chart, lab work & pertinent test results ? ?Airway ?Mallampati: II ? ?TM Distance: >3 FB ?Neck ROM: Full ? ? ? Dental ?no notable dental hx. ?(+) Teeth Intact, Dental Advisory Given ?  ?Pulmonary ?neg pulmonary ROS,  ?  ?Pulmonary exam normal ?breath sounds clear to auscultation ? ? ? ? ? ? Cardiovascular ?negative cardio ROS ?Normal cardiovascular exam ?Rhythm:Regular Rate:Normal ? ? ?  ?Neuro/Psych ?negative neurological ROS ? negative psych ROS  ? GI/Hepatic ?negative GI ROS, Neg liver ROS,   ?Endo/Other  ?negative endocrine ROS ? Renal/GU ?negative Renal ROS Bladder dysfunction ? ?SUI ? ?  ?Musculoskeletal ?Scoliosis  ? Abdominal ?  ?Peds ? Hematology ? ?(+) Blood dyscrasia, anemia ,   ?Anesthesia Other Findings ? ? Reproductive/Obstetrics ? ?  ? ? ? ? ? ? ? ? ? ? ? ? ? ?  ?  ? ? ? ? ? ? ?Anesthesia Physical ?Anesthesia Plan ? ?ASA: 2 ? ?Anesthesia Plan: MAC  ? ?Post-op Pain Management: Tylenol PO (pre-op)*, Precedex and Dilaudid IV  ? ?Induction:  ? ?PONV Risk Score and Plan: 4 or greater and Treatment may vary due to age or medical condition, Midazolam, Dexamethasone and Ondansetron ? ?Airway Management Planned: Natural Airway and Simple Face Mask ? ?Additional Equipment: None ? ?Intra-op Plan:  ? ?Post-operative Plan:  ? ?Informed Consent: I have reviewed the patients History and Physical, chart, labs and discussed the procedure including the risks, benefits and alternatives for the proposed anesthesia with the patient or authorized representative who has indicated his/her understanding and acceptance.  ? ? ? ?Dental advisory given ? ?Plan Discussed with: CRNA and Anesthesiologist ? ?Anesthesia Plan Comments:   ? ? ? ? ?Anesthesia Quick Evaluation ? ?

## 2022-03-11 ENCOUNTER — Encounter (HOSPITAL_BASED_OUTPATIENT_CLINIC_OR_DEPARTMENT_OTHER)
Admission: RE | Disposition: A | Discharge status: Home / Self Care | Payer: Self-pay | Source: Home / Self Care | Attending: Obstetrics and Gynecology

## 2022-03-11 ENCOUNTER — Other Ambulatory Visit: Payer: Self-pay

## 2022-03-11 ENCOUNTER — Ambulatory Visit (HOSPITAL_BASED_OUTPATIENT_CLINIC_OR_DEPARTMENT_OTHER): Payer: No Typology Code available for payment source | Admitting: Anesthesiology

## 2022-03-11 ENCOUNTER — Ambulatory Visit (HOSPITAL_BASED_OUTPATIENT_CLINIC_OR_DEPARTMENT_OTHER)
Admission: RE | Admit: 2022-03-11 | Discharge: 2022-03-11 | Disposition: A | Discharge status: Home / Self Care | Payer: No Typology Code available for payment source | Attending: Obstetrics and Gynecology | Admitting: Obstetrics and Gynecology

## 2022-03-11 ENCOUNTER — Telehealth: Payer: Self-pay | Admitting: Obstetrics and Gynecology

## 2022-03-11 ENCOUNTER — Encounter (HOSPITAL_BASED_OUTPATIENT_CLINIC_OR_DEPARTMENT_OTHER): Payer: Self-pay | Admitting: Obstetrics and Gynecology

## 2022-03-11 DIAGNOSIS — M419 Scoliosis, unspecified: Secondary | ICD-10-CM | POA: Insufficient documentation

## 2022-03-11 DIAGNOSIS — N319 Neuromuscular dysfunction of bladder, unspecified: Secondary | ICD-10-CM

## 2022-03-11 DIAGNOSIS — N393 Stress incontinence (female) (male): Secondary | ICD-10-CM

## 2022-03-11 DIAGNOSIS — D649 Anemia, unspecified: Secondary | ICD-10-CM

## 2022-03-11 DIAGNOSIS — N3946 Mixed incontinence: Secondary | ICD-10-CM

## 2022-03-11 DIAGNOSIS — Z20822 Contact with and (suspected) exposure to covid-19: Secondary | ICD-10-CM | POA: Insufficient documentation

## 2022-03-11 HISTORY — DX: Presence of spectacles and contact lenses: Z97.3

## 2022-03-11 LAB — POCT PREGNANCY, URINE: Preg Test, Ur: NEGATIVE

## 2022-03-11 SURGERY — INJECTION, BULKING AGENT, URETHRA
Anesthesia: Monitor Anesthesia Care

## 2022-03-11 MED ORDER — LIDOCAINE HCL (PF) 2 % IJ SOLN
INTRAMUSCULAR | Status: AC
  Filled 2022-03-11: qty 5

## 2022-03-11 MED ORDER — ONDANSETRON HCL 4 MG/2ML IJ SOLN
INTRAMUSCULAR | Status: AC
  Filled 2022-03-11: qty 2

## 2022-03-11 MED ORDER — MIDAZOLAM HCL 2 MG/2ML IJ SOLN
INTRAMUSCULAR | Status: AC
  Filled 2022-03-11: qty 2

## 2022-03-11 MED ORDER — FENTANYL CITRATE (PF) 100 MCG/2ML IJ SOLN: 25.0000 ug | INTRAMUSCULAR | Status: DC | PRN

## 2022-03-11 MED ORDER — SODIUM CHLORIDE 0.9 % IR SOLN
Status: DC | PRN
  Administered 2022-03-11: 1000 mL via INTRAVESICAL

## 2022-03-11 MED ORDER — MIDAZOLAM HCL 5 MG/5ML IJ SOLN
INTRAMUSCULAR | Status: DC | PRN
  Administered 2022-03-11: 2 mg via INTRAVENOUS

## 2022-03-11 MED ORDER — ONDANSETRON HCL 4 MG/2ML IJ SOLN
INTRAMUSCULAR | Status: DC | PRN
  Administered 2022-03-11: 4 mg via INTRAVENOUS

## 2022-03-11 MED ORDER — CEFAZOLIN SODIUM-DEXTROSE 2-4 GM/100ML-% IV SOLN
2.0000 g | INTRAVENOUS | Status: AC
  Administered 2022-03-11: 2 g via INTRAVENOUS

## 2022-03-11 MED ORDER — PROPOFOL 10 MG/ML IV BOLUS
INTRAVENOUS | Status: DC | PRN
  Administered 2022-03-11: 25 mg via INTRAVENOUS

## 2022-03-11 MED ORDER — OXYCODONE HCL 5 MG PO TABS: 5.0000 mg | ORAL_TABLET | Freq: Once | ORAL | Status: DC | PRN

## 2022-03-11 MED ORDER — KETOROLAC TROMETHAMINE 30 MG/ML IJ SOLN
INTRAMUSCULAR | Status: AC
  Filled 2022-03-11: qty 1

## 2022-03-11 MED ORDER — PROPOFOL 500 MG/50ML IV EMUL
INTRAVENOUS | Status: AC
  Filled 2022-03-11: qty 50

## 2022-03-11 MED ORDER — OXYCODONE HCL 5 MG/5ML PO SOLN: 5.0000 mg | Freq: Once | ORAL | Status: DC | PRN

## 2022-03-11 MED ORDER — CEFAZOLIN SODIUM-DEXTROSE 2-4 GM/100ML-% IV SOLN
INTRAVENOUS | Status: AC
  Filled 2022-03-11: qty 100

## 2022-03-11 MED ORDER — FENTANYL CITRATE (PF) 100 MCG/2ML IJ SOLN
INTRAMUSCULAR | Status: DC | PRN
  Administered 2022-03-11 (×2): 50 ug via INTRAVENOUS

## 2022-03-11 MED ORDER — LACTATED RINGERS IV SOLN: INTRAVENOUS | Status: DC

## 2022-03-11 MED ORDER — ONDANSETRON HCL 4 MG/2ML IJ SOLN: 4.0000 mg | Freq: Once | INTRAMUSCULAR | Status: DC | PRN

## 2022-03-11 MED ORDER — KETOROLAC TROMETHAMINE 30 MG/ML IJ SOLN
INTRAMUSCULAR | Status: DC | PRN
Start: 2022-03-11 — End: 2022-03-11
  Administered 2022-03-11: 30 mg via INTRAVENOUS

## 2022-03-11 MED ORDER — LIDOCAINE 2% (20 MG/ML) 5 ML SYRINGE
INTRAMUSCULAR | Status: DC | PRN
  Administered 2022-03-11: 100 mg via INTRAVENOUS

## 2022-03-11 MED ORDER — FENTANYL CITRATE (PF) 100 MCG/2ML IJ SOLN
INTRAMUSCULAR | Status: AC
  Filled 2022-03-11: qty 2

## 2022-03-11 MED ORDER — PROPOFOL 500 MG/50ML IV EMUL
INTRAVENOUS | Status: DC | PRN
Start: 2022-03-11 — End: 2022-03-11
  Administered 2022-03-11: 100 ug/kg/min via INTRAVENOUS

## 2022-03-11 SURGICAL SUPPLY — 12 items
BAG DRAIN URO-CYSTO SKYTR STRL (DRAIN) ×2 IMPLANT
BAG DRN UROCATH (DRAIN) ×1
CATH ROBINSON RED A/P 12FR (CATHETERS) IMPLANT
DRAPE HYSTEROSCOPY (MISCELLANEOUS) ×2 IMPLANT
GLOVE SURG ENC MOIS LTX SZ6 (GLOVE) ×2 IMPLANT
GLOVE SURG UNDER POLY LF SZ6.5 (GLOVE) ×2 IMPLANT
GOWN STRL REUS W/TWL LRG LVL3 (GOWN DISPOSABLE) ×2 IMPLANT
KIT TURNOVER CYSTO (KITS) ×2 IMPLANT
MANIFOLD NEPTUNE II (INSTRUMENTS) ×2 IMPLANT
PACK CYSTO (CUSTOM PROCEDURE TRAY) ×2 IMPLANT
SYSTEM URETHRAL BULK BULKAMID (Female Continence) ×1 IMPLANT
TUBE CONNECTING 12X1/4 (SUCTIONS) ×2 IMPLANT

## 2022-03-11 NOTE — Discharge Instructions (Addendum)
Taking Care of Yourself after Urethral Bulking ? ?Drink plenty of water for a day or two following your procedure. Try to have about 8 ounces (one cup) at a time, and do this 6 times or more per day unless you have fluid restrictitons ?AVOID irritative beverages such as coffee, tea, soda, alcoholic or citrus drinks for a day or two, as this may cause burning with urination. ? For the first 1-2 days after the procedure, your urine may be pink or red in color. You may have some blood in your urine as a normal side effect of the procedure. Large amounts of bleeding or difficulty urinating are NOT normal. Call the nurse line if this happens or go to the nearest Emergency Room if the bleeding is heavy or you cannot urinate at all and it is after hours. If you need to be catheterized afterward, ask for a pediatric catheter to be used (size 10 or 12-French) so the material is not pushed out of place.   ?You may experience some discomfort or a burning sensation with urination after having this procedure. You can use over the counter Azo or pyridium to help with burning and follow the instructions on the packaging. If it does not improve within 1-2 days, or other symptoms appear (fever, chills, or difficulty urinating) call the office to speak to a nurse.  ?You may return to normal daily activities such as work, school, driving, exercising and housework on the day after the procedure. ? ?May have advil today after 4:30pm ? ?Post Anesthesia Home Care Instructions ? ?Activity: ?Get plenty of rest for the remainder of the day. A responsible adult should stay with you for 24 hours following the procedure.  ?For the next 24 hours, DO NOT: ?-Drive a car ?-Paediatric nurse ?-Drink alcoholic beverages ?-Take any medication unless instructed by your physician ?-Make any legal decisions or sign important papers. ? ?Meals: ?Start with liquid foods such as gelatin or soup. Progress to regular foods as tolerated. Avoid greasy, spicy,  heavy foods. If nausea and/or vomiting occur, drink only clear liquids until the nausea and/or vomiting subsides. Call your physician if vomiting continues. ? ?Special Instructions/Symptoms: ?Your throat may feel dry or sore from the anesthesia or the breathing tube placed in your throat during surgery. If this causes discomfort, gargle with warm salt water. The discomfort should disappear within 24 hours. ? ?If you had a scopolamine patch placed behind your ear for the management of post- operative nausea and/or vomiting: ? ?1. The medication in the patch is effective for 72 hours, after which it should be removed.  Wrap patch in a tissue and discard in the trash. Wash hands thoroughly with soap and water. ?2. You may remove the patch earlier than 72 hours if you experience unpleasant side effects which may include dry mouth, dizziness or visual disturbances. ?3. Avoid touching the patch. Wash your hands with soap and water after contact with the patch. ?   ?

## 2022-03-11 NOTE — Anesthesia Procedure Notes (Signed)
Procedure Name: [place] ?Date/Time: 03/11/2022 10:30 AM ?Performed by: Bonney Aid, CRNA ?Pre-anesthesia Checklist: Patient identified, Emergency Drugs available, Suction available, Patient being monitored and Timeout performed ?Patient Re-evaluated:Patient Re-evaluated prior to induction ?Oxygen Delivery Method: Nasal cannula ?Placement Confirmation: positive ETCO2 ? ? ? ? ?

## 2022-03-11 NOTE — Interval H&P Note (Signed)
History and Physical Interval Note: ? ?03/11/2022 ?9:49 AM ? ?Lisa Brady  has presented today for surgery, with the diagnosis of stress urinary incontinence.  The various methods of treatment have been discussed with the patient and family. After consideration of risks, benefits and other options for treatment, the patient has consented to  Procedure(s): ?URETHRAL BULKING with bulkamid (N/A) as a surgical intervention.  The patient's history has been reviewed, patient examined, no change in status, stable for surgery.  I have reviewed the patient's chart and labs.  Questions were answered to the patient's satisfaction.   ? ? ?Jaquita Folds ? ? ?

## 2022-03-11 NOTE — Anesthesia Postprocedure Evaluation (Signed)
Anesthesia Post Note ? ?Patient: Monaye Blackie ? ?Procedure(s) Performed: URETHRAL BULKING with bulkamid ? ?  ? ?Patient location during evaluation: PACU ?Anesthesia Type: MAC ?Level of consciousness: awake and alert and oriented ?Pain management: pain level controlled ?Vital Signs Assessment: post-procedure vital signs reviewed and stable ?Respiratory status: spontaneous breathing, nonlabored ventilation and respiratory function stable ?Cardiovascular status: stable and blood pressure returned to baseline ?Postop Assessment: no apparent nausea or vomiting ?Anesthetic complications: no ? ? ?No notable events documented. ? ?Last Vitals:  ?Vitals:  ? 03/11/22 1100 03/11/22 1105  ?BP: (!) 94/43   ?Pulse: 65 70  ?Resp: (!) 9 12  ?Temp:  36.6 ?C  ?SpO2: 98% 99%  ?  ?Last Pain:  ?Vitals:  ? 03/11/22 0850  ?TempSrc: Oral  ?PainSc: 0-No pain  ? ? ?  ?  ?  ?  ?  ?  ? ?Kinza Gouveia A. ? ? ? ? ?

## 2022-03-11 NOTE — Telephone Encounter (Signed)
Lisa Brady underwent cystoscopy with urethral bulking on 03/11/22.  ? ?She passed her voiding trial.  ?Voided 365m  ?PVR by bladder scan was 068m  ? ?She was discharged without a catheter. Please call her for a routine post op check. Thanks! ? ?MiJaquita FoldsMD ? ?

## 2022-03-11 NOTE — Transfer of Care (Signed)
Immediate Anesthesia Transfer of Care Note ? ?Patient: Lisa Brady ? ?Procedure(s) Performed: URETHRAL BULKING with bulkamid ? ?Patient Location: PACU ? ?Anesthesia Type:MAC ? ?Level of Consciousness: awake, alert  and oriented ? ?Airway & Oxygen Therapy: Patient Spontanous Breathing ? ?Post-op Assessment: Report given to RN ? ?Post vital signs: Reviewed and stable ? ?Last Vitals:  ?Vitals Value Taken Time  ?BP 94/43   ?Temp    ?Pulse 65 03/11/22 1059  ?Resp 9 03/11/22 1059  ?SpO2 98 % 03/11/22 1059  ?Vitals shown include unvalidated device data. ? ?Last Pain:  ?Vitals:  ? 03/11/22 0850  ?TempSrc: Oral  ?PainSc: 0-No pain  ?   ? ?Patients Stated Pain Goal: 3 (03/11/22 0850) ? ?Complications: No notable events documented. ?

## 2022-03-11 NOTE — Op Note (Signed)
Operative Note ? ?Preoperative Diagnosis: stress urinary incontinence ? ?Postoperative Diagnosis: same ? ?Procedures performed:  ?Cystoscopy with urethral bulking (Bulkamid) ? ?Implants:  ?Implant Name Type Inv. Item Serial No. Manufacturer Lot No. LRB No. Used Action  ?SYSTEM Hinda Kehr - [id number] Female Continence SYSTEM URETHRAL BULK BULKAMID  [employer] [id number] N/A 1 Implanted  ? ? ?Attending Surgeon: Sherlene Shams, MD ? ? ?Anesthesia: MAC ? ?Findings: Good coaptation of the urethral following injection of Bulkamid  ? ?Specimens: none ? ?Estimated blood loss: 1 mL ? ?IV fluids: 100 mL ? ?Urine output: 100 mL ? ?Complications: none ? ?Procedure in Detail: ? ?After informed consent was obtained the patient was taken to the operating room where MAC anesthesia was induced and found to be adequate.  A red rubber catheter was placed to drain the bladder.  A 0 degree cystoscope was inserted  to the level of the bladder neck. Urethral mucosa appeared normal.  The needle was primed.  The needle was inserted 2 cm and the scope was pulled back into the urethra 2 cm.  The needle was inserted bevel up at the 5 o'clock position and the Bulkamid was injected to obtain coaptation.  This was repeated at the 2 o'clock,  10 o'clock and 7 o'clock positions.   A total of 2- 72m syringes were used and good circumferential coaptation was noted.  The cystoscope was removed. The patient tolerated the procedure well was taken to recovery room in stable condition all counts were correct x2. ? ?MJaquita Folds MD ? ?

## 2022-03-16 ENCOUNTER — Encounter: Payer: Self-pay | Admitting: *Deleted

## 2022-03-17 NOTE — Telephone Encounter (Signed)
Called pt for post op check. LMOVM.  ?

## 2022-03-30 ENCOUNTER — Ambulatory Visit (INDEPENDENT_AMBULATORY_CARE_PROVIDER_SITE_OTHER): Payer: No Typology Code available for payment source | Admitting: Obstetrics and Gynecology

## 2022-03-30 ENCOUNTER — Encounter: Payer: Self-pay | Admitting: Obstetrics and Gynecology

## 2022-03-30 VITALS — BP 118/63 | HR 67

## 2022-03-30 DIAGNOSIS — N393 Stress incontinence (female) (male): Secondary | ICD-10-CM | POA: Diagnosis not present

## 2022-03-30 DIAGNOSIS — N8110 Cystocele, unspecified: Secondary | ICD-10-CM

## 2022-03-30 NOTE — Progress Notes (Signed)
[facility] ? ?Date of Visit: 03/30/2022 ? ?History of Present Illness: Ms. Thoreson is a 36 y.o. female scheduled today for a post-operative visit.  ?? Surgery: s/p urethral bulking on 03/11/22 ?? She passed her postoperative void trial.  ? ?Today she reports that she was coughing quite a bit initially and did not have any leakage. But she did have a sneezing spell and had some leakage with that. Has been feeling the urgency is a bit better. Had some blood in urine the first day after the procedure but none since ? ?She was fitted for her pessary when she was pregnant but has not used it since ? ? ?Medications: She has a current medication list which includes the following prescription(s): cetirizine, norethindrone acetate-ethinyl estradiol, and prenatal multivitamin.  ? ?Allergies: Patient has No Known Allergies.  ? ?Physical Exam: ?BP 118/63   Pulse 67   LMP  (LMP Unknown)   Breastfeeding No   ?AAO x3 ? ? ?POP-Q (01/20/22):  ?  ?POP-Q ?  ?-1  ?                                          Aa   ?-1 ?                                          Ba   ?-6  ?                                            C  ?  ?3  ?                                          Gh   ?4  ?                                          Pb   ?8  ?                                          tvl  ?  ?-2  ?                                          Ap   ?-2  ?                                          Bp   ?-7.5  ?  D  ?  ?  ? ?--------------------------------------------------------- ? ?Assessment and Plan:  ?1. SUI (stress urinary incontinence, female)   ?2. Prolapse of anterior vaginal wall   ? ?- We discussed that she has the option of a second injection of Bulkamid, but may continue to see improvement up to 4 weeks.  ?- She will try pessary for her prolapse again. If uncomfortable, she will return to try another pessary as the one she has was fitted in pregnancy.  ? ?She will notify if she needs further  treatment ? ?Jaquita Folds, MD ? ?Time spent: I spent 20 minutes dedicated to the care of this patient on the date of this encounter to include pre-visit review of records, face-to-face time with the patient and post visit documentation., ? ?

## 2022-04-07 ENCOUNTER — Encounter: Payer: No Typology Code available for payment source | Admitting: Dermatology

## 2022-05-05 ENCOUNTER — Ambulatory Visit: Payer: No Typology Code available for payment source | Admitting: Dermatology

## 2022-05-05 DIAGNOSIS — L814 Other melanin hyperpigmentation: Secondary | ICD-10-CM | POA: Diagnosis not present

## 2022-05-05 DIAGNOSIS — D229 Melanocytic nevi, unspecified: Secondary | ICD-10-CM

## 2022-05-05 DIAGNOSIS — D225 Melanocytic nevi of trunk: Secondary | ICD-10-CM | POA: Diagnosis not present

## 2022-05-05 DIAGNOSIS — Z1283 Encounter for screening for malignant neoplasm of skin: Secondary | ICD-10-CM | POA: Diagnosis not present

## 2022-05-05 DIAGNOSIS — L219 Seborrheic dermatitis, unspecified: Secondary | ICD-10-CM

## 2022-05-05 DIAGNOSIS — L578 Other skin changes due to chronic exposure to nonionizing radiation: Secondary | ICD-10-CM

## 2022-05-05 DIAGNOSIS — D1800 Hemangioma unspecified site: Secondary | ICD-10-CM

## 2022-05-05 DIAGNOSIS — Z86018 Personal history of other benign neoplasm: Secondary | ICD-10-CM

## 2022-05-05 DIAGNOSIS — L858 Other specified epidermal thickening: Secondary | ICD-10-CM

## 2022-05-05 DIAGNOSIS — D485 Neoplasm of uncertain behavior of skin: Secondary | ICD-10-CM

## 2022-05-05 DIAGNOSIS — L821 Other seborrheic keratosis: Secondary | ICD-10-CM

## 2022-05-05 MED ORDER — KETOCONAZOLE 2 % EX SHAM: 1.0000 "application " | MEDICATED_SHAMPOO | CUTANEOUS | 3 refills | Status: DC

## 2022-05-05 NOTE — Patient Instructions (Signed)

## 2022-05-05 NOTE — Progress Notes (Signed)
Follow-Up Visit   Subjective  Lisa Brady is a 36 y.o. female who presents for the following: Annual Exam (History of dysplastic nevi - The patient presents for Total-Body Skin Exam (TBSE) for skin cancer screening and mole check.  The patient has spots, moles and lesions to be evaluated, some may be new or changing and the patient has concerns that these could be cancer./).  The following portions of the chart were reviewed this encounter and updated as appropriate:   Tobacco  Allergies  Meds  Problems  Med Hx  Surg Hx  Fam Hx     Review of Systems:  No other skin or systemic complaints except as noted in HPI or Assessment and Plan.  Objective  Well appearing patient in no apparent distress; mood and affect are within normal limits.  A full examination was performed including scalp, head, eyes, ears, nose, lips, neck, chest, axillae, abdomen, back, buttocks, bilateral upper extremities, bilateral lower extremities, hands, feet, fingers, toes, fingernails, and toenails. All findings within normal limits unless otherwise noted below.  Bilateral tricep areas Rough follicular plugging  Right mid medial scapula 0.6 cm irregular brown macule     Right mid to sup buttock 0.6 cm irregular brown macule      Assessment & Plan   Lentigines - Scattered tan macules - Due to sun exposure - Benign-appearing, observe - Recommend daily broad spectrum sunscreen SPF 30+ to sun-exposed areas, reapply every 2 hours as needed. - Call for any changes  Seborrheic Keratoses - Stuck-on, waxy, tan-brown papules and/or plaques  - Benign-appearing - Discussed benign etiology and prognosis. - Observe - Call for any changes  Melanocytic Nevi - Tan-brown and/or pink-flesh-colored symmetric macules and papules - Benign appearing on exam today - Observation - Call clinic for new or changing moles - Recommend daily use of broad spectrum spf 30+ sunscreen to sun-exposed areas.    Hemangiomas - Red papules - Discussed benign nature - Observe - Call for any changes  Actinic Damage - Chronic condition, secondary to cumulative UV/sun exposure - diffuse scaly erythematous macules with underlying dyspigmentation - Recommend daily broad spectrum sunscreen SPF 30+ to sun-exposed areas, reapply every 2 hours as needed.  - Staying in the shade or wearing long sleeves, sun glasses (UVA+UVB protection) and wide brim hats (4-inch brim around the entire circumference of the hat) are also recommended for sun protection.  - Call for new or changing lesions.  Skin cancer screening performed today.  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 Condition is symptomatic / bothersome to patient. Not to goal. Continue OTC dandruff shampoo.  Add Ketoconazole 2% shampoo  ketoconazole (NIZORAL) 2 % shampoo - Scalp Apply 1 application. topically 2 (two) times a week. Leave on a few minutes then rinse  Keratosis pilaris Bilateral tricep areas Benign-appearing.  Observation.  Call clinic for new or changing lesions.  Recommend daily use of broad spectrum spf 30+ sunscreen to sun-exposed areas.   Neoplasm of uncertain behavior of skin (2) Right mid medial scapula Epidermal / dermal shaving  Informed consent: discussed and consent obtained   Timeout: patient name, date of birth, surgical site, and procedure verified   Procedure prep:  Patient was prepped and draped in usual sterile fashion Prep type:  Isopropyl alcohol Anesthesia: the lesion was anesthetized in a standard fashion   Anesthetic:  1% lidocaine w/ epinephrine 1-100,000 buffered w/ 8.4% NaHCO3 Instrument used: flexible razor blade  Hemostasis achieved with: pressure, aluminum chloride and electrodesiccation   Outcome: patient tolerated procedure well   Post-procedure details: sterile dressing applied and wound care instructions given    Dressing type: bandage and petrolatum    Specimen 1 - Surgical pathology Differential Diagnosis: Nevus vs Dysplastic nevus Check Margins: No  Right mid to sup buttock Epidermal / dermal shaving  Informed consent: discussed and consent obtained   Timeout: patient name, date of birth, surgical site, and procedure verified   Procedure prep:  Patient was prepped and draped in usual sterile fashion Prep type:  Isopropyl alcohol Anesthesia: the lesion was anesthetized in a standard fashion   Anesthetic:  1% lidocaine w/ epinephrine 1-100,000 buffered w/ 8.4% NaHCO3 Instrument used: flexible razor blade   Hemostasis achieved with: pressure, aluminum chloride and electrodesiccation   Outcome: patient tolerated procedure well   Post-procedure details: sterile dressing applied and wound care instructions given   Dressing type: bandage and petrolatum    Specimen 2 - Surgical pathology Differential Diagnosis: Nevus vs Dysplastic nevus Check Margins: No  Skin cancer screening  Return in about 1 year (around 05/06/2023) for TBSE.  I, Ashok Cordia, CMA, am acting as scribe for Sarina Ser, MD . Documentation: I have reviewed the above documentation for accuracy and completeness, and I agree with the above.  Sarina Ser, MD

## 2022-05-10 ENCOUNTER — Encounter: Payer: Self-pay | Admitting: Dermatology

## 2022-05-10 ENCOUNTER — Telehealth: Payer: Self-pay

## 2022-05-10 NOTE — Telephone Encounter (Signed)
Left message for patient to call for results/hd

## 2022-05-10 NOTE — Telephone Encounter (Signed)
-----   Message from Ralene Bathe, MD sent at 05/09/2022  5:52 PM EDT ----- Diagnosis 1. Skin , right mid medial scapula DYSPLASTIC COMPOUND NEVUS WITH MODERATE ATYPIA, PERIPHERAL MARGIN INVOLVED 2. Skin , right mid to sup buttock DYSPLASTIC COMPOUND NEVUS WITH MODERATE ATYPIA, DEEP MARGIN INVOLVED  1&2 -both Moderate dysplastic Recheck next visit

## 2022-05-19 ENCOUNTER — Telehealth: Payer: Self-pay

## 2022-05-19 NOTE — Telephone Encounter (Signed)
-----   Message from Ralene Bathe, MD sent at 05/09/2022  5:52 PM EDT ----- Diagnosis 1. Skin , right mid medial scapula DYSPLASTIC COMPOUND NEVUS WITH MODERATE ATYPIA, PERIPHERAL MARGIN INVOLVED 2. Skin , right mid to sup buttock DYSPLASTIC COMPOUND NEVUS WITH MODERATE ATYPIA, DEEP MARGIN INVOLVED  1&2 -both Moderate dysplastic Recheck next visit

## 2022-05-19 NOTE — Telephone Encounter (Signed)
Advised patient of results/hd  

## 2022-05-26 ENCOUNTER — Encounter: Payer: Self-pay | Admitting: Obstetrics and Gynecology

## 2022-05-28 ENCOUNTER — Other Ambulatory Visit: Payer: Self-pay | Admitting: Family

## 2022-05-28 DIAGNOSIS — L409 Psoriasis, unspecified: Secondary | ICD-10-CM

## 2022-05-28 MED ORDER — CLOBETASOL PROPIONATE 0.05 % EX SOLN: 1.0000 | Freq: Two times a day (BID) | CUTANEOUS | 0 refills | Status: AC

## 2022-06-02 ENCOUNTER — Other Ambulatory Visit: Payer: Self-pay

## 2022-06-04 ENCOUNTER — Ambulatory Visit (INDEPENDENT_AMBULATORY_CARE_PROVIDER_SITE_OTHER): Payer: No Typology Code available for payment source | Admitting: Family

## 2022-06-04 ENCOUNTER — Other Ambulatory Visit: Payer: Self-pay

## 2022-06-04 ENCOUNTER — Encounter: Payer: Self-pay | Admitting: Family

## 2022-06-04 VITALS — BP 100/60 | HR 70 | Temp 97.8°F | Resp 16 | Ht 63.0 in | Wt 125.6 lb

## 2022-06-04 DIAGNOSIS — J020 Streptococcal pharyngitis: Secondary | ICD-10-CM | POA: Insufficient documentation

## 2022-06-04 DIAGNOSIS — J029 Acute pharyngitis, unspecified: Secondary | ICD-10-CM | POA: Insufficient documentation

## 2022-06-04 MED ORDER — AMOXICILLIN 500 MG PO CAPS
500.0000 mg | ORAL_CAPSULE | Freq: Two times a day (BID) | ORAL | 0 refills | Status: AC
  Filled 2022-06-04: qty 20, 10d supply, fill #0

## 2022-06-04 NOTE — Assessment & Plan Note (Signed)
Strep tested in office, positive Warm salt water gargles

## 2022-06-04 NOTE — Patient Instructions (Signed)
You were found to be strep positive,  Take antibiotics that have been sent to the pharmacy.  Change your toothbrush after 24 hours on the antibiotics.  Gargle with warm salt water as needed for sore throat.   Due to recent changes in healthcare laws, you may see results of your imaging and/or laboratory studies on MyChart before I have had a chance to review them.  I understand that in some cases there may be results that are confusing or concerning to you. Please understand that not all results are received at the same time and often I may need to interpret multiple results in order to provide you with the best plan of care or course of treatment. Therefore, I ask that you please give me 2 business days to thoroughly review all your results before contacting my office for clarification. Should we see a critical lab result, you will be contacted sooner.   It was a pleasure seeing you today! Please do not hesitate to reach out with any questions and or concerns.  Regards,   Eugenia Pancoast FNP-C

## 2022-06-04 NOTE — Assessment & Plan Note (Signed)
Strep tested positive in office.  rx for amox 500 mg po bid x 10 days.  Ibuprofen/tyelnol prn sore throat/fever Pt told to F/u if no improvement in the next 2-3 days.  

## 2022-06-04 NOTE — Progress Notes (Signed)
Established Patient Office Visit  Subjective:  Patient ID: Lisa Brady, female    DOB: 1986/11/18  Age: 36 y.o. MRN: [id number]  CC:  Chief Complaint  Patient presents with   Sore Throat    X 3 days  no fever    HPI Lisa Brady is here today with concerns.   Started about five days ago with sore throat, body aches , and an enlarged lymph node on the left side. A decreased appetite and some mild chest discomfort. Had a headache yesterday.   No fever but slight chills. Worse days of symptoms was two days ago, has improved slightly but still with aches.   Covid tested, negative.  Taking ibuprofen tid with some mild relief.   Past Medical History:  Diagnosis Date   Choroid plexus cyst of fetus on prenatal ultrasound 05/29/2021   Dysplastic nevus 04/01/2021   right medial forearm, mod Atypia    Dysplastic nevus 04/01/2021   right inferior axilla, mild atypia    Dysplastic nevus 04/01/2021   right superior medial scapula, mild atypia    Dysplastic nevus 05/05/2022   Right mid med scapula - moderate   Dysplastic nevus 05/05/2022   Right mid to sup buttock - moderate   Foot drop    History of left foot drop 08/17/2019   History of vacuum extraction assisted delivery 04/03/2021   Hypermobility arthralgia    Maternal varicella, non-immune 11/20/2018   Scoliosis    Urinary incontinence    Wears glasses     Past Surgical History:  Procedure Laterality Date   WISDOM TOOTH EXTRACTION  2006    Family History  Problem Relation Age of Onset   Breast cancer Mother 46   Melanoma Mother    Hyperlipidemia Father    Hypertension Father    Hyperlipidemia Brother    Brain cancer Maternal Grandmother    Heart disease Maternal Grandfather    Lung cancer Paternal Grandmother    Lung cancer Paternal Grandfather    Hyperlipidemia Brother    Hyperlipidemia Brother    Hyperlipidemia Brother     Social History   Socioeconomic History   Marital status: Married     Spouse name: [name]   Number of children: 1   Years of education: Not on file   Highest education level: Not on file  Occupational History   Not on file  Tobacco Use   Smoking status: Never   Smokeless tobacco: Never  Vaping Use   Vaping Use: Never used  Substance and Sexual Activity   Alcohol use: Yes    Alcohol/week: 1.0 standard drink of alcohol    Types: 1 Glasses of wine per week   Drug use: Never   Sexual activity: Yes    Birth control/protection: Pill  Other Topics Concern   Not on file  Social History Narrative   Lisa Brady born [birth year]   Social Determinants of Health   Financial Resource Strain: Not on file  Food Insecurity: Not on file  Transportation Needs: Not on file  Physical Activity: Not on file  Stress: Not on file  Social Connections: Not on file  Intimate Partner Violence: Not on file    Outpatient Medications Prior to Visit  Medication Sig Dispense Refill   cetirizine (ZYRTEC) 10 MG tablet Take 10 mg by mouth at bedtime.     clobetasol (TEMOVATE) 0.05 % external solution Apply 1 Application topically 2 (two) times daily. 50 mL 0   ketoconazole (NIZORAL) 2 % shampoo Apply  1 application. topically 2 (two) times a week. Leave on a few minutes then rinse (Patient not taking: Reported on 06/04/2022) 120 mL 3   Norethindrone Acetate-Ethinyl Estradiol (LOESTRIN 1.5/30, 21,) 1.5-30 MG-MCG tablet Take 1 tablet by mouth daily. (Patient not taking: Reported on 06/04/2022) 84 tablet 3   Prenatal Vit-Fe Fumarate-FA (PRENATAL MULTIVITAMIN) TABS tablet Take 1 tablet by mouth daily at 12 noon. (Patient not taking: Reported on 03/30/2022) 90 tablet 3   No facility-administered medications prior to visit.    No Known Allergies      Objective:    Physical Exam Constitutional:      General: She is not in acute distress.    Appearance: Normal appearance. She is not ill-appearing.  HENT:     Right Ear: Tympanic membrane normal.     Left Ear: Tympanic membrane normal.      Nose: Nose normal. No congestion or rhinorrhea.     Right Turbinates: Not enlarged or swollen.     Left Turbinates: Not enlarged or swollen.     Right Sinus: No maxillary sinus tenderness or frontal sinus tenderness.     Left Sinus: No maxillary sinus tenderness or frontal sinus tenderness.     Mouth/Throat:     Mouth: Mucous membranes are moist.     Pharynx: Posterior oropharyngeal erythema present. No pharyngeal swelling or oropharyngeal exudate.     Tonsils: No tonsillar exudate.  Eyes:     Extraocular Movements: Extraocular movements intact.     Conjunctiva/sclera: Conjunctivae normal.     Pupils: Pupils are equal, round, and reactive to light.  Neck:     Thyroid: No thyroid mass.  Cardiovascular:     Rate and Rhythm: Normal rate and regular rhythm.  Pulmonary:     Effort: Pulmonary effort is normal.     Breath sounds: Normal breath sounds.  Lymphadenopathy:     Cervical: Cervical adenopathy present.     Right cervical: Superficial cervical adenopathy (with tenderness) present.     Left cervical: No superficial cervical adenopathy.  Neurological:     Mental Status: She is alert.     BP 100/60   Pulse 70   Temp 97.8 F (36.6 C)   Resp 16   Ht '5\' 3"'$  (1.6 m)   Wt 125 lb 9 oz (57 kg)   LMP 06/04/2022 (Approximate)   SpO2 99%   Breastfeeding No   BMI 22.24 kg/m  Wt Readings from Last 3 Encounters:  06/04/22 125 lb 9 oz (57 kg)  03/11/22 125 lb (56.7 kg)  03/10/22 125 lb (56.7 kg)     Health Maintenance Due  Topic Date Due   PAP SMEAR-Modifier  05/08/2021   COVID-19 Vaccine (2 - Mixed Product risk series) 09/03/2021    There are no preventive care reminders to display for this patient.  Lab Results  Component Value Date   TSH 1.39 09/10/2020   Lab Results  Component Value Date   WBC 12.9 (H) 10/17/2021   HGB 10.1 (L) 10/17/2021   HCT 30.0 (L) 10/17/2021   MCV 90.9 10/17/2021   PLT 138 (L) 10/17/2021   Lab Results  Component Value Date   NA 137  09/17/2020   K 4.4 09/17/2020   CO2 29 09/17/2020   GLUCOSE 109 (H) 09/17/2020   BUN 10 09/17/2020   CREATININE 0.69 09/17/2020   BILITOT 1.3 (H) 09/17/2020   ALKPHOS 32 (L) 09/17/2020   AST 28 09/17/2020   ALT 41 (H) 09/17/2020   PROT 7.4  09/17/2020   ALBUMIN 4.5 09/17/2020   CALCIUM 9.3 09/17/2020   GFR 112.08 09/17/2020   Lab Results  Component Value Date   HGBA1C 5.3 09/17/2020      Assessment & Plan:   Problem List Items Addressed This Visit       Respiratory   Strep pharyngitis    Strep tested positive in office.  rx for amox 500 mg po bid x 10 days.  Ibuprofen/tyelnol prn sore throat/fever Pt told to F/u if no improvement in the next 2-3 days.       Relevant Medications   amoxicillin (AMOXIL) 500 MG capsule     Other   Sore throat - Primary    Strep tested in office, positive Warm salt water gargles        Relevant Medications   amoxicillin (AMOXIL) 500 MG capsule   Other Relevant Orders   POCT rapid strep A    Meds ordered this encounter  Medications   amoxicillin (AMOXIL) 500 MG capsule    Sig: Take 1 capsule (500 mg total) by mouth 2 (two) times daily for 10 days.    Dispense:  20 capsule    Refill:  0    Order Specific Question:   Supervising Provider    Answer:   BEDSOLE, AMY E [[id number]]    Follow-up: Return if symptoms worsen or fail to improve with pcp.    Eugenia Pancoast, FNP

## 2022-06-17 NOTE — Progress Notes (Signed)
    GYNECOLOGY CLINIC PROCEDURE NOTE  Lisa Brady is a 36 y.o. [phone] here for Mirena IUD insertion. No GYN concerns.  Last pap smear was on 05/08/2018 and was normal. She will have another pap smear done today.  IUD Insertion Procedure Note Patient identified, informed consent performed.  Discussed risks of irregular bleeding, cramping, infection, malpositioning or misplacement of the IUD outside the uterus which may require further procedures. Time out was performed.  Urine pregnancy test negative.  Speculum placed in the vagina.  Cervix visualized.  Pap smear performed. Cleaned with Betadine x 2.  Grasped anteriorly with a single tooth tenaculum.  Uterus sounded to 7.5 cm.  Mirena IUD placed per manufacturer's recommendations.  Strings trimmed to 3 cm. Tenaculum was removed, good hemostasis noted.  Patient tolerated procedure well.   Patient was given post-procedure instructions.  Patient was also asked to check IUD strings periodically and follow up in 4 weeks for IUD check.    Lot: [id number] Exp: 07/2024

## 2022-06-18 ENCOUNTER — Encounter: Payer: Self-pay | Admitting: Obstetrics and Gynecology

## 2022-06-18 ENCOUNTER — Other Ambulatory Visit (HOSPITAL_COMMUNITY)
Admission: RE | Admit: 2022-06-18 | Discharge: 2022-06-18 | Disposition: A | Discharge status: Home / Self Care | Payer: No Typology Code available for payment source | Source: Ambulatory Visit | Attending: Obstetrics and Gynecology | Admitting: Obstetrics and Gynecology

## 2022-06-18 ENCOUNTER — Ambulatory Visit (INDEPENDENT_AMBULATORY_CARE_PROVIDER_SITE_OTHER): Payer: No Typology Code available for payment source | Admitting: Obstetrics and Gynecology

## 2022-06-18 VITALS — BP 119/54 | HR 86 | Resp 16 | Ht 63.0 in | Wt 127.8 lb

## 2022-06-18 DIAGNOSIS — Z124 Encounter for screening for malignant neoplasm of cervix: Secondary | ICD-10-CM | POA: Diagnosis present

## 2022-06-18 DIAGNOSIS — Z30430 Encounter for insertion of intrauterine contraceptive device: Secondary | ICD-10-CM

## 2022-06-18 LAB — POCT URINE PREGNANCY: Preg Test, Ur: NEGATIVE

## 2022-06-18 MED ORDER — LEVONORGESTREL 20 MCG/DAY IU IUD
1.0000 | INTRAUTERINE_SYSTEM | Freq: Once | INTRAUTERINE | Status: AC
  Administered 2022-06-18: 1 via INTRAUTERINE

## 2022-06-18 NOTE — Patient Instructions (Signed)

## 2022-06-24 LAB — CYTOLOGY - PAP
Comment: NEGATIVE
Diagnosis: NEGATIVE
High risk HPV: NEGATIVE

## 2022-07-13 ENCOUNTER — Other Ambulatory Visit: Payer: Self-pay | Admitting: Family

## 2022-07-13 DIAGNOSIS — B009 Herpesviral infection, unspecified: Secondary | ICD-10-CM

## 2022-07-13 MED ORDER — VALACYCLOVIR HCL 1 G PO TABS: ORAL_TABLET | ORAL | 0 refills | Status: AC

## 2022-07-19 NOTE — Progress Notes (Deleted)
    GYNECOLOGY OFFICE ENCOUNTER NOTE  History:  36 y.o. [id number] here today for today for IUD string check; Mirena  IUD was placed  06/18/2022. No complaints about the IUD, no concerning side effects.  The following portions of the patient's history were reviewed and updated as appropriate: allergies, current medications, past family history, past medical history, past social history, past surgical history and problem list. Last pap smear on 05/08/2018 was normal, negative HRHPV.  Review of Systems:  Pertinent items are noted in HPI.  Objective:  Physical Exam not currently breastfeeding. CONSTITUTIONAL: Well-developed, well-nourished female in no acute distress.  NEUROLOGIC: Alert and oriented to person, place, and time. Normal reflexes, muscle tone coordination.  ABDOMEN: Soft, no distention noted.   PELVIC: Normal appearing external genitalia; normal appearing vaginal mucosa and cervix.  IUD strings visualized, about *** cm in length outside cervix. Done in the presence of a chaperone.  EXTREMITIES: Non-tender, no edema or cyanosis  Assessment & Plan:  Patient to keep IUD in place for up to 8 years; can come in for removal if she desires pregnancy earlier or for any concerning side effects.    [name], MD [facility]

## 2022-07-21 ENCOUNTER — Encounter: Payer: No Typology Code available for payment source | Admitting: Obstetrics and Gynecology

## 2022-09-07 ENCOUNTER — Encounter: Payer: Self-pay | Admitting: Family

## 2022-09-07 ENCOUNTER — Ambulatory Visit (INDEPENDENT_AMBULATORY_CARE_PROVIDER_SITE_OTHER): Payer: No Typology Code available for payment source | Admitting: Family

## 2022-09-07 VITALS — BP 110/78 | HR 82 | Resp 16 | Ht 63.0 in | Wt 125.0 lb

## 2022-09-07 DIAGNOSIS — U071 COVID-19: Secondary | ICD-10-CM | POA: Insufficient documentation

## 2022-09-07 DIAGNOSIS — Z20822 Contact with and (suspected) exposure to covid-19: Secondary | ICD-10-CM

## 2022-09-07 LAB — POC COVID19 BINAXNOW: SARS Coronavirus 2 Ag: POSITIVE — AB

## 2022-09-07 NOTE — Assessment & Plan Note (Signed)
Advised of CDC guidelines for self isolation/ ending isolation.  Advised of safe practice guidelines. Symptom Tier reviewed.  Encouraged to monitor for any worsening symptoms; watch for increased shortness of breath, weakness, and signs of dehydration. Advised when to seek emergency care.  Instructed to rest and hydrate well.  Advised to leave the house during recommended isolation period, only if it is necessary to seek medical care  

## 2022-09-07 NOTE — Progress Notes (Signed)
Established Patient Office Visit  Subjective:  Patient ID: Lisa Brady, female    DOB: 10-09-1986  Age: 36 y.o. MRN: [id number]  CC:  Chief Complaint  Patient presents with   Covid Exposure    HPI Lisa Brady is here today with concerns.   Started with symptoms about five days ago.  She c/o nasal congestion, pnd, sore throat/scratchy Covid tested five days ago and was negative Kids at home with fever and sick also.   Past Medical History:  Diagnosis Date   Choroid plexus cyst of fetus on prenatal ultrasound 05/29/2021   Dysplastic nevus 04/01/2021   right medial forearm, mod Atypia    Dysplastic nevus 04/01/2021   right inferior axilla, mild atypia    Dysplastic nevus 04/01/2021   right superior medial scapula, mild atypia    Dysplastic nevus 05/05/2022   Right mid med scapula - moderate   Dysplastic nevus 05/05/2022   Right mid to sup buttock - moderate   Foot drop    History of left foot drop 08/17/2019   History of vacuum extraction assisted delivery 04/03/2021   Hypermobility arthralgia    Maternal varicella, non-immune 11/20/2018   Scoliosis    Urinary incontinence    Wears glasses     Past Surgical History:  Procedure Laterality Date   WISDOM TOOTH EXTRACTION  2006    Family History  Problem Relation Age of Onset   Breast cancer Mother 16   Melanoma Mother    Hyperlipidemia Father    Hypertension Father    Hyperlipidemia Brother    Brain cancer Maternal Grandmother    Heart disease Maternal Grandfather    Lung cancer Paternal Grandmother    Lung cancer Paternal Grandfather    Hyperlipidemia Brother    Hyperlipidemia Brother    Hyperlipidemia Brother     Social History   Socioeconomic History   Marital status: Married    Spouse name: [name]   Number of children: 1   Years of education: Not on file   Highest education level: Not on file  Occupational History   Not on file  Tobacco Use   Smoking status: Never   Smokeless  tobacco: Never  Vaping Use   Vaping Use: Never used  Substance and Sexual Activity   Alcohol use: Yes    Alcohol/week: 1.0 standard drink of alcohol    Types: 1 Glasses of wine per week   Drug use: Never   Sexual activity: Yes    Birth control/protection: Pill  Other Topics Concern   Not on file  Social History Narrative   [name] born [birth year]   Social Determinants of Health   Financial Resource Strain: Not on file  Food Insecurity: Not on file  Transportation Needs: Not on file  Physical Activity: Not on file  Stress: Not on file  Social Connections: Not on file  Intimate Partner Violence: Not on file    Outpatient Medications Prior to Visit  Medication Sig Dispense Refill   cetirizine (ZYRTEC) 10 MG tablet Take 10 mg by mouth at bedtime.     clobetasol (TEMOVATE) 0.05 % external solution Apply 1 Application topically 2 (two) times daily. 50 mL 0   valACYclovir (VALTREX) 1000 MG tablet Take two tablets po bid for one dose prn outbreak 30 tablet 0   No facility-administered medications prior to visit.    No Known Allergies      Objective:    Physical Exam Constitutional:      General: She  is not in acute distress.    Appearance: Normal appearance. She is not ill-appearing.  HENT:     Right Ear: Tympanic membrane normal.     Left Ear: Tympanic membrane normal.     Nose: Nose normal. No congestion or rhinorrhea.     Right Turbinates: Not enlarged or swollen.     Left Turbinates: Not enlarged or swollen.     Right Sinus: No maxillary sinus tenderness or frontal sinus tenderness.     Left Sinus: No maxillary sinus tenderness or frontal sinus tenderness.     Mouth/Throat:     Mouth: Mucous membranes are moist.     Pharynx: No pharyngeal swelling, oropharyngeal exudate or posterior oropharyngeal erythema.     Tonsils: No tonsillar exudate.  Eyes:     Extraocular Movements: Extraocular movements intact.     Conjunctiva/sclera: Conjunctivae normal.     Pupils: Pupils  are equal, round, and reactive to light.  Neck:     Thyroid: No thyroid mass.  Cardiovascular:     Rate and Rhythm: Normal rate and regular rhythm.  Pulmonary:     Effort: Pulmonary effort is normal.     Breath sounds: Normal breath sounds.  Lymphadenopathy:     Cervical:     Right cervical: No superficial cervical adenopathy.    Left cervical: No superficial cervical adenopathy.  Neurological:     Mental Status: She is alert.     BP 110/78   Pulse 82   Resp 16   Ht '5\' 3"'$  (1.6 m)   Wt 125 lb (56.7 kg)   SpO2 99%   BMI 22.14 kg/m  Wt Readings from Last 3 Encounters:  09/07/22 125 lb (56.7 kg)  06/18/22 127 lb 12.8 oz (58 kg)  06/04/22 125 lb 9 oz (57 kg)     Health Maintenance Due  Topic Date Due   COVID-19 Vaccine (2 - Mixed Product risk series) 09/03/2021   INFLUENZA VACCINE  07/06/2022    There are no preventive care reminders to display for this patient.  Lab Results  Component Value Date   TSH 1.39 09/10/2020   Lab Results  Component Value Date   WBC 12.9 (H) 10/17/2021   HGB 10.1 (L) 10/17/2021   HCT 30.0 (L) 10/17/2021   MCV 90.9 10/17/2021   PLT 138 (L) 10/17/2021   Lab Results  Component Value Date   NA 137 09/17/2020   K 4.4 09/17/2020   CO2 29 09/17/2020   GLUCOSE 109 (H) 09/17/2020   BUN 10 09/17/2020   CREATININE 0.69 09/17/2020   BILITOT 1.3 (H) 09/17/2020   ALKPHOS 32 (L) 09/17/2020   AST 28 09/17/2020   ALT 41 (H) 09/17/2020   PROT 7.4 09/17/2020   ALBUMIN 4.5 09/17/2020   CALCIUM 9.3 09/17/2020   GFR 112.08 09/17/2020   Lab Results  Component Value Date   HGBA1C 5.3 09/17/2020      Assessment & Plan:   Problem List Items Addressed This Visit       Other   COVID-19 - Primary    Advised of CDC guidelines for self isolation/ ending isolation.  Advised of safe practice guidelines. Symptom Tier reviewed.  Encouraged to monitor for any worsening symptoms; watch for increased shortness of breath, weakness, and signs of  dehydration. Advised when to seek emergency care.  Instructed to rest and hydrate well.  Advised to leave the house during recommended isolation period, only if it is necessary to seek medical care  Other Visit Diagnoses     Exposure to COVID-19 virus       Relevant Orders   POC COVID-19 BinaxNow (Completed)       No orders of the defined types were placed in this encounter.   Follow-up: No follow-ups on file.    Eugenia Pancoast, FNP

## 2022-09-16 ENCOUNTER — Encounter: Payer: Self-pay | Admitting: Obstetrics and Gynecology

## 2022-09-20 ENCOUNTER — Encounter: Payer: Self-pay | Admitting: Certified Nurse Midwife

## 2022-11-03 IMAGING — US US OB COMP +14 WK
1 series · 15 of 28 positions shown · non-contrast
Comparison: none

CLINICAL DATA: Scan for anatomy. Patient is 34 years old, at 19
weeks 6 days by assigned EDC of 10/15/2021.

EXAM:
OBSTETRICAL ULTRASOUND >14 WKS

[Series 1: us ob comp + 14 wk · 94 acquisitions, 15 frames shown]
[im 1/94]
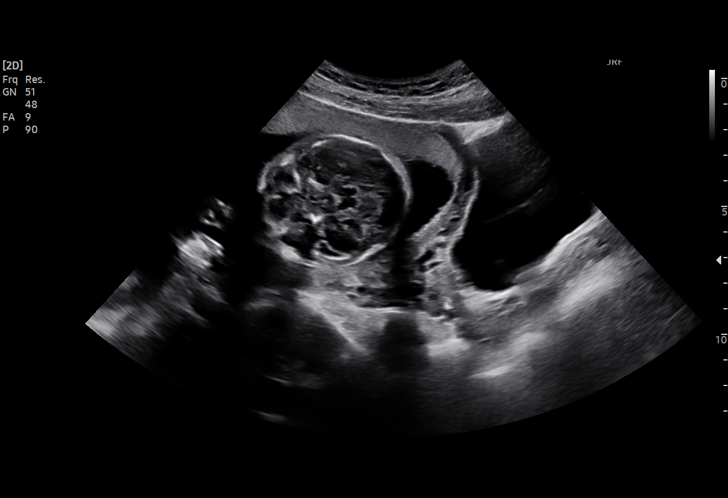
[im 7/94]
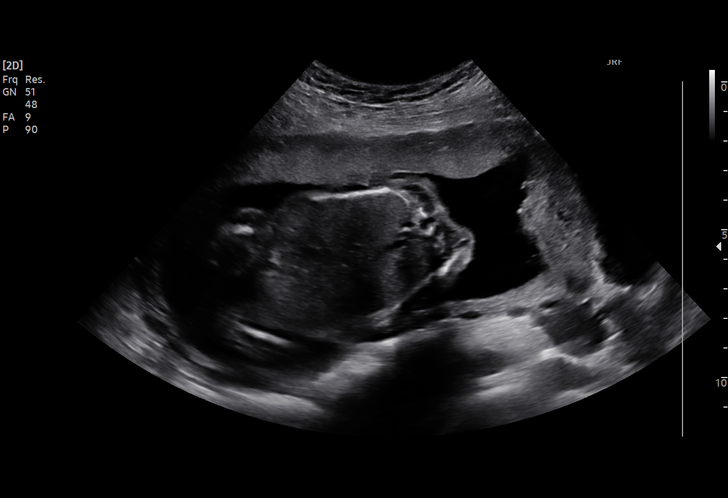
[im 14/94]
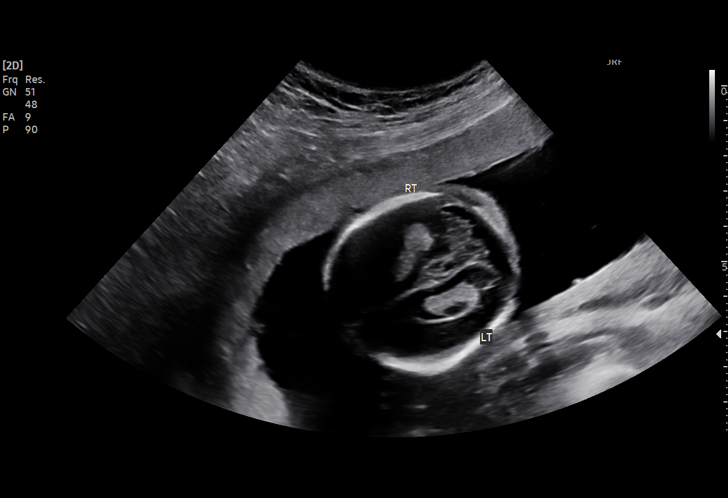
[im 21/94]
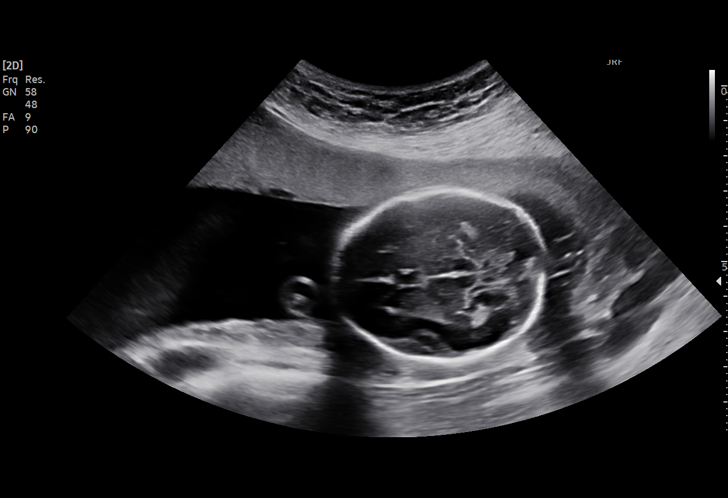
[im 28/94]
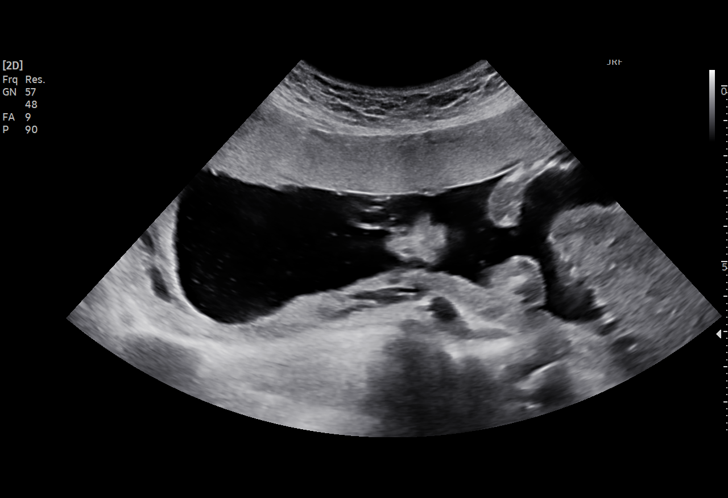
[im 35/94]
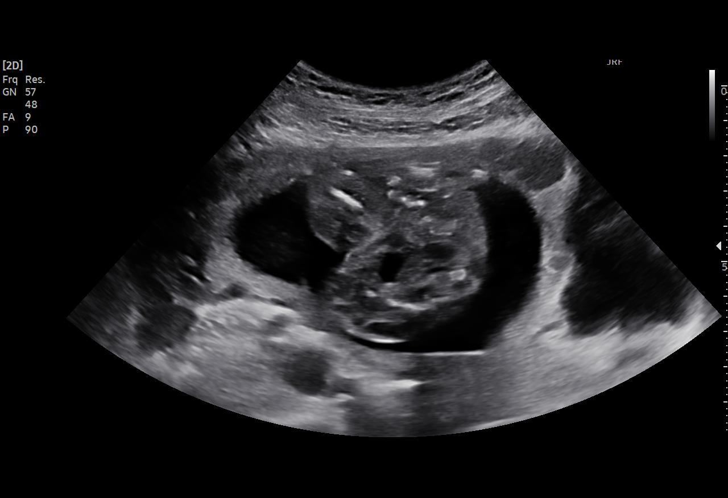
[im 42/94]
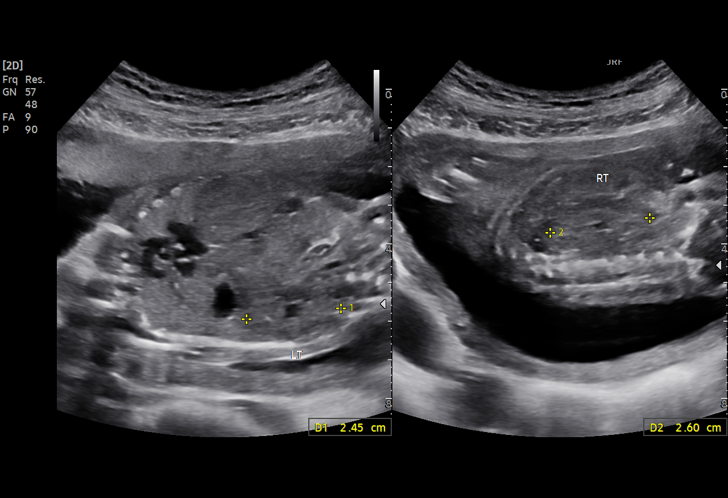
[im 49/94]
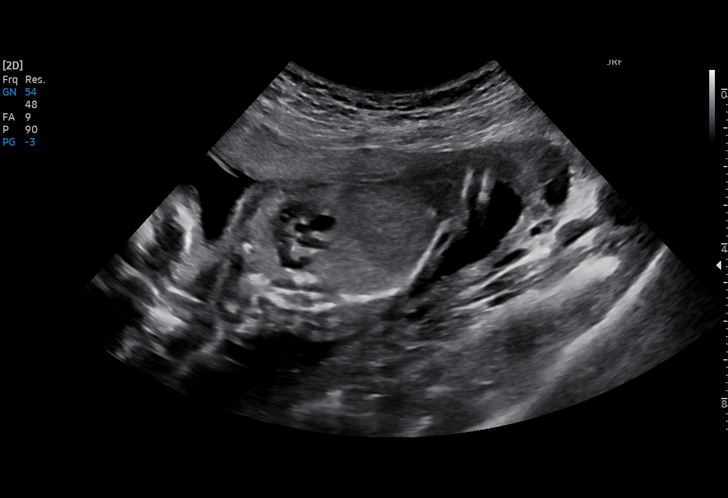
[im 52/94]
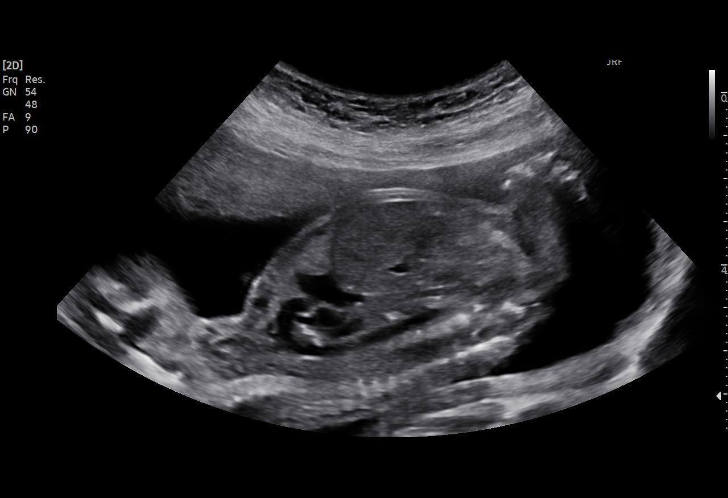
[im 59/94]
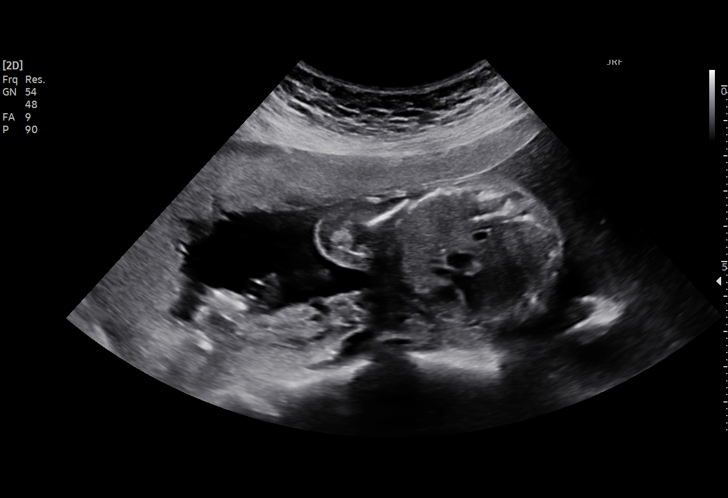
[im 66/94]
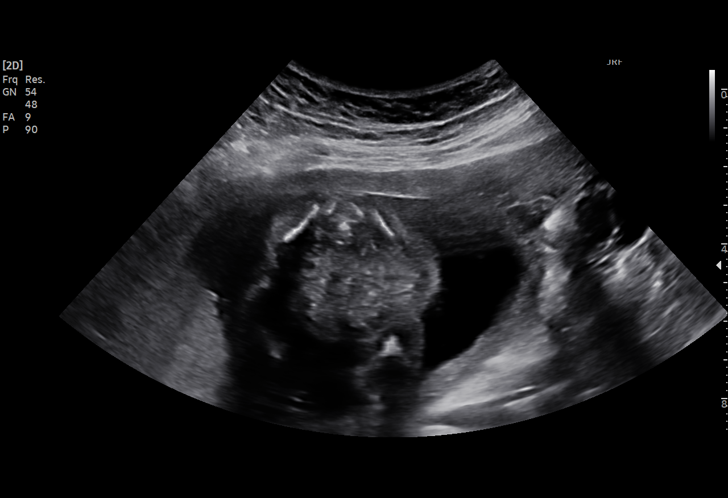
[im 73/94]
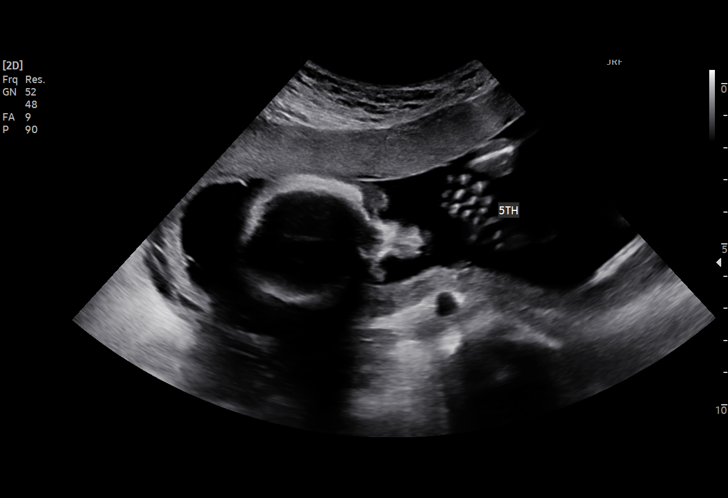
[im 80/94]
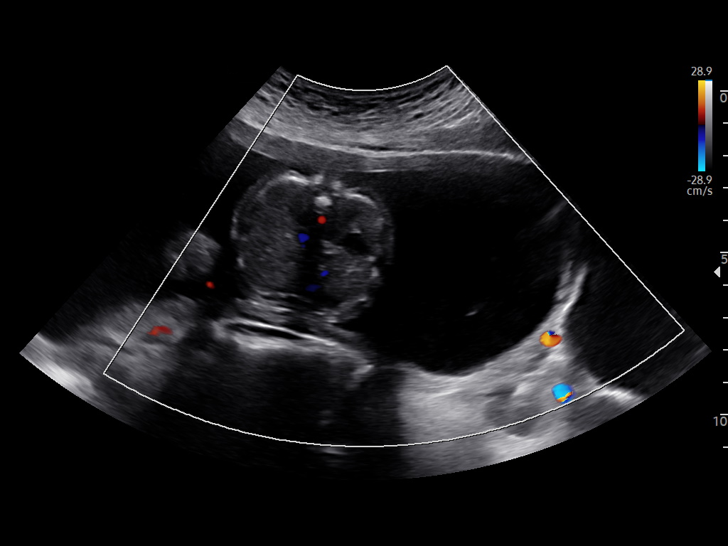
[im 87/94]
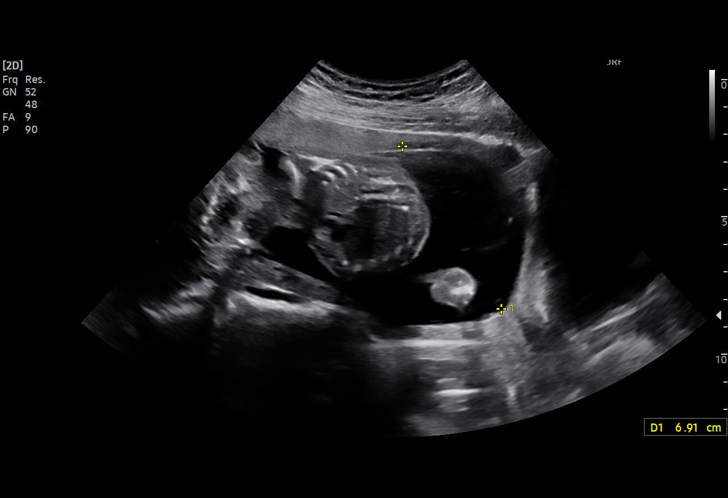
[im 94/94]
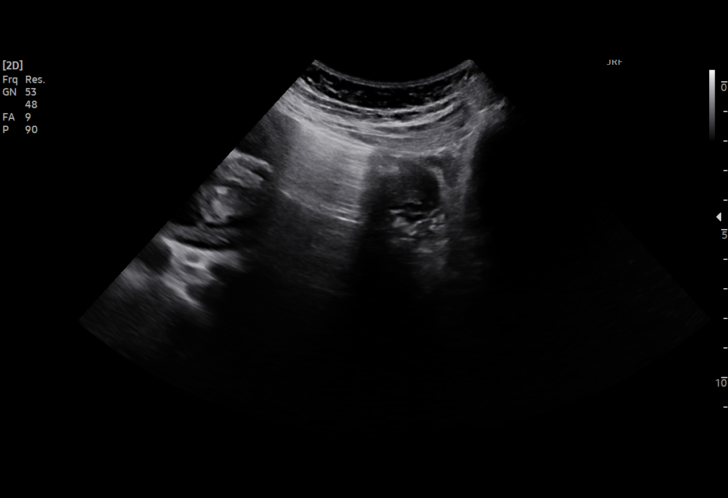

[15 of 28 positions shown; findings below may reference images not displayed]

FINDINGS: Number of Fetuses: 1

Heart Rate:  145 bpm

Movement: Present

Presentation: Cephalic

Previa: None

Placental Location: Posterior

Amniotic Fluid (Subjective): Normal

Amniotic Fluid (Objective):

Vertical pocket = 6.3cm

FETAL BIOMETRY

BPD: 4.86cm 20w 5d

HC:   17.78cm 20w 2d

AC:   15.84cm 21w 0d

FL:   3.13cm 19w 5d

Current Mean GA: 20w 2d US EDC: 10/12/2021

Assigned GA:  19w 6d Assigned EDC: 10/15/2021

FETAL ANATOMY

Lateral Ventricles: LEFT choroid plexus cyst measures 7.1 x
millimeters. RIGHT choroid plexus is unremarkable.

Thalami/CSP: Appears normal

Posterior Fossa:  Appears normal

Nuchal Region: Appears normal   NFT= 3.4 millimeters

Upper Lip: Appears normal

Spine: Appears normal

4 Chamber Heart on Left: Appears normal

LVOT: Appears normal

RVOT: Appears normal

Stomach on Left: Appears normal

3 Vessel Cord: Appears normal

Cord Insertion site: Appears normal

Kidneys: Appears normal

Bladder: Appears normal

Extremities: Appears normal

Technically difficult due to: Not applicable

Maternal Findings:

Cervix: 3.2 centimeters on transabdominal evaluation. RIGHT ovary is
normal in appearance. LEFT ovary not well seen.
IMPRESSION: 1. Single living intrauterine fetus in cephalic presentation.
2. Size and dates correlate within 3 days.
3. Normal volume amniotic fluid.
4. LEFT choroid plexus cyst is noted. This is a common variant (1-
2%) of all pregnancies, and carries a small association with Trisomy
18. No other ultrasound stigmata of Trisomy 18 are identified,
making the possibility of Trisomy 18 less likely.

## 2023-01-24 DIAGNOSIS — H44002 Unspecified purulent endophthalmitis, left eye: Secondary | ICD-10-CM

## 2023-01-24 MED ORDER — AMOXICILLIN-POT CLAVULANATE 875-125 MG PO TABS: 1.0000 | ORAL_TABLET | Freq: Two times a day (BID) | ORAL | 0 refills | Status: DC

## 2023-02-11 ENCOUNTER — Encounter: Payer: Self-pay | Admitting: *Deleted

## 2023-02-11 ENCOUNTER — Encounter: Payer: Self-pay | Admitting: Family

## 2023-02-11 ENCOUNTER — Ambulatory Visit (INDEPENDENT_AMBULATORY_CARE_PROVIDER_SITE_OTHER): Payer: BC Managed Care – PPO | Admitting: Family

## 2023-02-11 VITALS — BP 118/72 | HR 71 | Temp 98.4°F | Ht 63.0 in | Wt 125.0 lb

## 2023-02-11 DIAGNOSIS — H01009 Unspecified blepharitis unspecified eye, unspecified eyelid: Secondary | ICD-10-CM

## 2023-02-11 DIAGNOSIS — N3946 Mixed incontinence: Secondary | ICD-10-CM | POA: Diagnosis not present

## 2023-02-11 DIAGNOSIS — H01119 Allergic dermatitis of unspecified eye, unspecified eyelid: Secondary | ICD-10-CM | POA: Diagnosis not present

## 2023-02-11 MED ORDER — TACROLIMUS 0.1 % EX OINT: TOPICAL_OINTMENT | Freq: Two times a day (BID) | CUTANEOUS | 0 refills | Status: DC

## 2023-02-11 NOTE — Progress Notes (Signed)
Established Patient Office Visit  Subjective:   Patient ID: Lisa Brady, female    DOB: 1986-06-13  Age: 37 y.o. MRN: [id number]  CC:  Chief Complaint  Patient presents with   Eye Problem    HPI: Lisa Brady is a 37 y.o. female presenting on 02/11/2023 for Eye Problem     HPI  Has h/o blepharitis started with some leftover erythromycin ointment, and it helped it stay at [place] but didn't improve. This started about one month ago. She then was given oral antbx (augmentin) which helped her slightly in the first two days although still a little bit red and dry. The swelling and the itching was gone at the time.   Still ongoing eye redness, bil lower eyes. Left greater than right side.  No change in vision.  She has been taking allergy pills and trying to use warm compresses on her eyes as well and using cerave healing ointment but feels this is getting worse.   Does have a dermatologist appt in the next few weeks.   Worsening urinary incontiennce, pelvic floro therapy has helped in the past . Asking again for a referral to restart this.      ROS: Negative unless specifically indicated above in HPI.   Relevant past medical history reviewed and updated as indicated.   Allergies and medications reviewed and updated.   Current Outpatient Medications:    cetirizine (ZYRTEC) 10 MG tablet, Take 10 mg by mouth at bedtime., Disp: , Rfl:    clobetasol (TEMOVATE) 0.05 % external solution, Apply 1 Application topically 2 (two) times daily., Disp: 50 mL, Rfl: 0   tacrolimus (PROTOPIC) 0.1 % ointment, Apply topically 2 (two) times daily., Disp: 100 g, Rfl: 0   valACYclovir (VALTREX) 1000 MG tablet, Take two tablets po bid for one dose prn outbreak, Disp: 30 tablet, Rfl: 0  No Known Allergies  Objective:   BP 118/72   Pulse 71   Temp 98.4 F (36.9 C) (Temporal)   Ht '5\' 3"'$  (1.6 m)   Wt 125 lb (56.7 kg)   LMP  (LMP Unknown) Comment: IUD  SpO2 99%   BMI 22.14 kg/m     Physical Exam Constitutional:      General: She is not in acute distress.    Appearance: Normal appearance. She is normal weight. She is not ill-appearing, toxic-appearing or diaphoretic.  HENT:     Head: Normocephalic.  Cardiovascular:     Rate and Rhythm: Normal rate.  Pulmonary:     Effort: Pulmonary effort is normal.  Musculoskeletal:        General: Normal range of motion.  Skin:    Comments: Bil eyelid erythema, worse on left over right. Both inferior eyelid as well as left upper eyelid  Neurological:     General: No focal deficit present.     Mental Status: She is alert and oriented to person, place, and time. Mental status is at baseline.  Psychiatric:        Mood and Affect: Mood normal.        Behavior: Behavior normal.        Thought Content: Thought content normal.        Judgment: Judgment normal.        Assessment & Plan:  Eyelid dermatitis, allergic/contact Assessment & Plan: Trial of tacrolimus 0.1%  Referral placed for opthamologist  Orders: -     Tacrolimus; Apply topically 2 (two) times daily.  Dispense: 100 g; Refill: 0 -  Ambulatory referral to Ophthalmology  Chronic blepharitis Assessment & Plan: Unsure if this is blepharitis at this time however referral for opth placed.  Trial for treatment allergic contact dermatitis   Orders: -     Ambulatory referral to Ophthalmology  Mixed stress and urge urinary incontinence Assessment & Plan: Referral placed for pelvic floor therapy   Orders: -     Ambulatory referral to Physical Therapy     Follow up plan: Return for f/u with primary care provider if no improvement.  Eugenia Pancoast, FNP

## 2023-02-11 NOTE — Assessment & Plan Note (Signed)
Trial of tacrolimus 0.1%  Referral placed for opthamologist

## 2023-02-11 NOTE — Assessment & Plan Note (Signed)
Unsure if this is blepharitis at this time however referral for opth placed.  Trial for treatment allergic contact dermatitis

## 2023-02-11 NOTE — Patient Instructions (Signed)
  A referral was placed today for pelvic floor therapy as well as opthamology Please let us know if you have not heard back within 2 weeks about the referral.   Regards,   Bilbo Carcamo FNP-C

## 2023-02-11 NOTE — Assessment & Plan Note (Signed)
Referral placed for pelvic floor therapy. ?

## 2023-04-27 ENCOUNTER — Encounter: Payer: Self-pay | Admitting: Physical Therapy

## 2023-04-27 ENCOUNTER — Ambulatory Visit: Payer: BC Managed Care – PPO | Attending: Family | Admitting: Physical Therapy

## 2023-04-27 DIAGNOSIS — N3946 Mixed incontinence: Secondary | ICD-10-CM | POA: Insufficient documentation

## 2023-04-27 DIAGNOSIS — M62838 Other muscle spasm: Secondary | ICD-10-CM | POA: Diagnosis present

## 2023-04-27 DIAGNOSIS — M533 Sacrococcygeal disorders, not elsewhere classified: Secondary | ICD-10-CM | POA: Diagnosis present

## 2023-04-27 DIAGNOSIS — R278 Other lack of coordination: Secondary | ICD-10-CM | POA: Insufficient documentation

## 2023-04-27 NOTE — Therapy (Unsigned)
OUTPATIENT PHYSICAL THERAPY EVALUATION   Patient Name: Lisa Brady MRN: [id number] DOB:04-Feb-1986, 37 y.o., female Today's Date: 04/27/2023   PT End of Session - 04/27/23 1508     Visit Number 1    Number of Visits 10    Date for PT Re-Evaluation 07/06/23    PT Start Time 1505    PT Stop Time 1545    PT Time Calculation (min) 40 min    Activity Tolerance Patient tolerated treatment well;No increased pain    Behavior During Therapy [facility] for tasks assessed/performed             Past Medical History:  Diagnosis Date   Choroid plexus cyst of fetus on prenatal ultrasound 05/29/2021   Dysplastic nevus 04/01/2021   right medial forearm, mod Atypia    Dysplastic nevus 04/01/2021   right inferior axilla, mild atypia    Dysplastic nevus 04/01/2021   right superior medial scapula, mild atypia    Dysplastic nevus 05/05/2022   Right mid med scapula - moderate   Dysplastic nevus 05/05/2022   Right mid to sup buttock - moderate   Foot drop    History of left foot drop 08/17/2019   History of vacuum extraction assisted delivery 04/03/2021   Hypermobility arthralgia    Maternal varicella, non-immune 11/20/2018   Scoliosis    Urinary incontinence    Wears glasses    Past Surgical History:  Procedure Laterality Date   WISDOM TOOTH EXTRACTION  2006   Patient Active Problem List   Diagnosis Date Noted   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   Eyelid dermatitis, allergic/contact 02/11/2023   Hypermobility arthralgia 02/25/2021   Chronic pain of right knee 09/17/2020   Urinary incontinence 08/17/2019    PCP: Chestine Spore   REFERRING PROVIDER: Alfonse Alpers  REFERRING DIAG: Mixed stress and urge urinary incontinence   Rationale for Evaluation and Treatment Rehabilitation  THERAPY DIAG:  Other muscle spasm  Sacrococcygeal disorders, not elsewhere classified  ONSET DATE:   SUBJECTIVE:                                                                                                                                                                                            SUBJECTIVE STATEMENT: Pt had urethral bulking one year ago. It helped with her incontinence which is why she did not keep up with her PT exercises. But the Tx did not help with prolapse.  1) prolapse Sx:  Pt had a long lapse between Pelvic PT.  Pt notices more pressure sensation and more urgency when she is constipation. Denied recurrent UTI. Pt is able to empty urine ok, when she is  more constipated , she feels she does have more difficulty emptying completely.   2)  Constipation:  Pt noticed when she is constipated, she notices more prolapse and urgency.  If she is good about taking Miralax, pt has BMs daily and does not strain. When she remembers, she takes Miralax 3-4 x week. Without Miralax, pt has stool consistency Type 2-3.   With Miralax, consistency is a Type 4.   Daily water intake: 70 oz a day water when working,  60 fl oz of water when she not working, Pt is trying to add more fiber.   Pt had constipation issues on and off for a long time. Pt had 2 children with 2nd degree perineal scars.   3) SUI: pt has leakage with laughing, coughing, sneezing. Pt empties frequently just in case before leaving to activities. Pt avoids lots of activities with fear of leaking, with kids activities.   4) intermittent LBP :   when lying on her back at night in bed , 4/10 pain,central location , nonradiating.   5) L knee pain with sitting for 10 min to 1 hour : located at the back for knee which causes her pain when she stands up after sitting .  Pain 4-5/10 .  Resolves quickly.   6) R shoulder tightness: with ER/ IR   PERTINENT HISTORY:  Pt had 2 children with 2nd degree perineal scars. Hx of Hypermobility . 2nd child weighs 25 lbs and she is needing to carried by pt. Pt has tried kegel exercise sbut she felt dyscoordinated at her abs.  Pt currently wears a shoe lift in her R shoe.  PAIN:  Are you having pain? Yes:    PRECAUTIONS: None  WEIGHT BEARING RESTRICTIONS: No  FALLS:  Has patient fallen in last 6 months? No  LIVING ENVIRONMENT: Lives with: lives with their family Lives in: House/apartment Stairs: 2 story    OCCUPATION: physician   PLOF: Independent  PATIENT GOALS:  Try to commit to 15 min to lunch break to PT HEP and while kids are napping,  and to keep accountability for Pelvic PT HEP   Pt would like to not pee on herself and avoid prolapse surgery  Not leak with swimming    OBJECTIVE:      HOME EXERCISE PROGRAM: See pt instruction section    ASSESSMENT:  CLINICAL IMPRESSION:   prolapse Sx,  constipation, SUI, intermittent LBP, L knee pain, R shoulder tightness: with ER/ IR   Pt is a    yo  who presents with    which impact QOL, ADL, fitness, and community activities.    Pt's musculoskeletal assessment revealed uneven pelvic girdle and shoulder height, asymmetries to gait pattern, limited spinal /pelvic mobility, dyscoordination and strength of pelvic floor mm, hip weakness, poor body mechanics which places strain on the abdominal/pelvic floor mm. These are deficits that indicate an ineffective intraabdominal pressure system associated with increased risk for pt's Sx.     Pt performs many gravity-loaded tasks at work and home. Pt will benefit from proper coordination training and education on fitness and functional positions in order to yield greater outcomes as pt performed sit-ups/ crunches in the past.  Advised pt to not perform sit-ups and crunches as these movement patterns lead to more downward forces on the pelvic floor, negatively impacting abdominopelvic/spinal dysfunctions.     Pt will benefit from coordination training and education on fitness and functional positions in order to gain a more effective intraabdominal pressure system  to minimize urinary leakage.  Pt was provided education on etiology of Sx with anatomy, physiology explanation with images  along with the benefits of customized pelvic PT Tx based on pt's medical conditions and musculoskeletal deficits.  Explained the physiology of deep core mm coordination and roles of pelvic floor function in urination, defecation, sexual function, and postural control with deep core mm system.    Regional interdependent approaches will yield greater benefits in pt's POC due to the complexity of pt's medical Hx and the significant impact their Sx have had on their QOL. Pt would benefit from a biopsychosocial approach to yield optimal outcomes. Plan to build interdisciplinary team with pt's providers to optimize patient-centered care.    Following Tx today which pt tolerated without complaints,   pt demo'd equal alignment of pelvic girdle and increased spinal mobility.  pt demo'd proper body mechanics to minimize straining pelvic floor.   Plan to continue with realignment of spine and promote more anterior tilt of pelvis before initiaiting deep core coordination training.   Pt benefits from skilled PT.    OBJECTIVE IMPAIRMENTS decreased activity tolerance, decreased coordination, decreased endurance, decreased mobility, difficulty walking, decreased ROM, decreased strength, decreased safety awareness, hypomobility, increased muscle spasms, impaired flexibility, improper body mechanics, postural dysfunction, and pain. scar restrictions   ACTIVITY LIMITATIONS  self-care,  sleep, home chores, work tasks    PARTICIPATION LIMITATIONS:  community, gym activities    PERSONAL FACTORS     are also affecting patient's functional outcome.    REHAB POTENTIAL: Good   CLINICAL DECISION MAKING: Evolving/moderate complexity   EVALUATION COMPLEXITY: Moderate    PATIENT EDUCATION:    Education details: Showed pt anatomy images. Explained muscles attachments/ connection, physiology of deep core system/ spinal- thoracic-pelvis-lower kinetic chain as they relate to pt's presentation, Sx, and past Hx.  Explained what and how these areas of deficits need to be restored to balance and function    See Therapeutic activity / neuromuscular re-education section  Answered pt's questions.   Person educated: Patient Education method: Explanation, Demonstration, Tactile cues, Verbal cues, and Handouts Education comprehension: verbalized understanding, returned demonstration, verbal cues required, tactile cues required, and needs further education     PLAN: PT FREQUENCY: 1x/week   PT DURATION: 10 weeks   PLANNED INTERVENTIONS: Therapeutic exercises, Therapeutic activity, Neuromuscular re-education, Balance training, Gait training, Patient/Family education, Self Care, Joint mobilization, Spinal mobilization, Moist heat, Taping, and Manual therapy, dry needling.   PLAN FOR NEXT SESSION: See clinical impression for plan     GOALS: Goals reviewed with patient? Yes  SHORT TERM GOALS: Target date: ***  Pt will demo IND with HEP                    Baseline: Not IND            Goal status: INITIAL   LONG TERM GOALS: Target date: ***  1.Pt will demo proper deep core coordination without chest breathing and optimal excursion of diaphragm/pelvic floor in order to promote spinal stability and pelvic floor function  Baseline: dyscoordination Goal status: INITIAL  2.  Pt will demo > 5 pt change on FOTO  to improve QOL and function  PFDI Urinary baseline -  Lower score = better function  Urinary Problem baseline-   Higher score = better function  Pelvic Pain baseline - Lower score = better function  Bowel  constipation baseline -   Higher score = better function  PFDI Bowel - Higher score =  better function  Lumber baseline  -  Higher score = better function   Goal status: INITIAL  3.  Pt will demo proper body mechanics in against gravity tasks and ADLs  work tasks, fitness  to minimize straining pelvic floor / back                  Baseline: not IND, improper form that places  strain on pelvic floor                Goal status: INITIAL    4.  Baseline:  Goal status: INITIAL    5.  Baseline:  Goal status: INITIAL   6.  Baseline:  Goal status: INITIAL     Mariane Masters, PT 04/27/2023, 3:28 PM

## 2023-04-28 NOTE — Patient Instructions (Signed)
Sitting with feet on floor 4 points of contact

## 2023-05-04 ENCOUNTER — Ambulatory Visit: Payer: BC Managed Care – PPO | Admitting: Physical Therapy

## 2023-05-04 ENCOUNTER — Ambulatory Visit: Payer: BC Managed Care – PPO | Admitting: Dermatology

## 2023-05-04 ENCOUNTER — Encounter: Payer: Self-pay | Admitting: Physical Therapy

## 2023-05-04 VITALS — BP 118/65 | HR 84

## 2023-05-04 DIAGNOSIS — D2261 Melanocytic nevi of right upper limb, including shoulder: Secondary | ICD-10-CM

## 2023-05-04 DIAGNOSIS — D1801 Hemangioma of skin and subcutaneous tissue: Secondary | ICD-10-CM | POA: Diagnosis not present

## 2023-05-04 DIAGNOSIS — D229 Melanocytic nevi, unspecified: Secondary | ICD-10-CM

## 2023-05-04 DIAGNOSIS — L814 Other melanin hyperpigmentation: Secondary | ICD-10-CM | POA: Diagnosis not present

## 2023-05-04 DIAGNOSIS — L821 Other seborrheic keratosis: Secondary | ICD-10-CM | POA: Diagnosis not present

## 2023-05-04 DIAGNOSIS — L219 Seborrheic dermatitis, unspecified: Secondary | ICD-10-CM

## 2023-05-04 DIAGNOSIS — Z79899 Other long term (current) drug therapy: Secondary | ICD-10-CM

## 2023-05-04 DIAGNOSIS — X32XXXA Exposure to sunlight, initial encounter: Secondary | ICD-10-CM

## 2023-05-04 DIAGNOSIS — Z808 Family history of malignant neoplasm of other organs or systems: Secondary | ICD-10-CM

## 2023-05-04 DIAGNOSIS — Z86018 Personal history of other benign neoplasm: Secondary | ICD-10-CM

## 2023-05-04 DIAGNOSIS — L858 Other specified epidermal thickening: Secondary | ICD-10-CM

## 2023-05-04 DIAGNOSIS — Z1283 Encounter for screening for malignant neoplasm of skin: Secondary | ICD-10-CM

## 2023-05-04 DIAGNOSIS — L578 Other skin changes due to chronic exposure to nonionizing radiation: Secondary | ICD-10-CM

## 2023-05-04 DIAGNOSIS — M533 Sacrococcygeal disorders, not elsewhere classified: Secondary | ICD-10-CM

## 2023-05-04 DIAGNOSIS — R278 Other lack of coordination: Secondary | ICD-10-CM

## 2023-05-04 DIAGNOSIS — M62838 Other muscle spasm: Secondary | ICD-10-CM | POA: Diagnosis not present

## 2023-05-04 DIAGNOSIS — W908XXA Exposure to other nonionizing radiation, initial encounter: Secondary | ICD-10-CM

## 2023-05-04 DIAGNOSIS — D492 Neoplasm of unspecified behavior of bone, soft tissue, and skin: Secondary | ICD-10-CM

## 2023-05-04 MED ORDER — KETOCONAZOLE 2 % EX SHAM: MEDICATED_SHAMPOO | CUTANEOUS | 6 refills | Status: AC

## 2023-05-04 MED ORDER — KETOCONAZOLE 2 % EX CREA: TOPICAL_CREAM | CUTANEOUS | 6 refills | Status: AC

## 2023-05-04 MED ORDER — PIMECROLIMUS 1 % EX CREA: TOPICAL_CREAM | CUTANEOUS | 6 refills | Status: DC

## 2023-05-04 NOTE — Patient Instructions (Signed)
Remove shoe lift from R shoe for this week  ___  Lengthen Back rib by R  shoulder   ( winging)    Lie on L  side , pillow between knees and under head  Pull  R arm overhead over mattress, grab the edge of mattress,pull it upward, drawing elbow away from ears  Breathing 10 reps  Brushing arm with 3/4 turn onto pillow behind back  Lying on L  side ,Pillow/ Block between knees     dragging top forearm across ribs below breast rotating 3/4 turn,  rotating  _R_ only this week ,  relax onto the pillow behind the back  and then back to other palm , maintain top palm on body whole top and not lift shoulder  __   Angle wings 10 reps with towel roll under neck ( sushi roll size)  Both arms dragging  ___  avoid urge suppression with the first urge to urination, do not delay first urge and to fully relax to empy completely

## 2023-05-04 NOTE — Therapy (Signed)
OUTPATIENT PHYSICAL THERAPY TREATMENT   Patient Name: Lisa Brady MRN: [id number] DOB:[birth year], 37 y.o., female Today's Date: 05/04/2023   PT End of Session - 05/04/23 0815     Visit Number 2    Number of Visits 10    Date for PT Re-Evaluation 07/06/23    PT Start Time 0804    PT Stop Time 0845    PT Time Calculation (min) 41 min    Activity Tolerance Patient tolerated treatment well;No increased pain    Behavior During Therapy [facility] for tasks assessed/performed             Past Medical History:  Diagnosis Date   Choroid plexus cyst of fetus on prenatal ultrasound 05/29/2021   Dysplastic nevus 04/01/2021   right medial forearm, mod Atypia    Dysplastic nevus 04/01/2021   right inferior axilla, mild atypia    Dysplastic nevus 04/01/2021   right superior medial scapula, mild atypia    Dysplastic nevus 05/05/2022   Right mid med scapula - moderate   Dysplastic nevus 05/05/2022   Right mid to sup buttock - moderate   Foot drop    History of left foot drop 08/17/2019   History of vacuum extraction assisted delivery 04/03/2021   Hypermobility arthralgia    Maternal varicella, non-immune 11/20/2018   Scoliosis    Urinary incontinence    Wears glasses    Past Surgical History:  Procedure Laterality Date   WISDOM TOOTH EXTRACTION  2006   Patient Active Problem List   Diagnosis Date Noted   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   Eyelid dermatitis, allergic/contact 02/11/2023   Hypermobility arthralgia 02/25/2021   Chronic pain of right knee 09/17/2020   Urinary incontinence 08/17/2019    PCP: Chestine Spore   REFERRING PROVIDER: Alfonse Alpers  REFERRING DIAG: Mixed stress and urge urinary incontinence   Rationale for Evaluation and Treatment Rehabilitation  THERAPY DIAG:  Other muscle spasm  Sacrococcygeal disorders, not elsewhere classified  Other lack of coordination  ONSET DATE:   SUBJECTIVE:           SUBJECTIVE STATEMENT:                                                      Pt had a urge leakage episode over the weekend which was upsetting. Pt has been wearing her shoe lift in R shoe since her 2nd pregnancy. Pt notices her R shoulder is higher and tight and does not know what to do about it.                                                                                                                             SUBJECTIVE STATEMENT ON EVAL 04/27/23 : Pt had urethral bulking one year ago. It helped with her incontinence which is why she  did not keep up with her PT exercises. But the Tx did not help with prolapse.  1) prolapse Sx:  Pt had a long lapse between Pelvic PT.  Pt notices more pressure sensation and more urgency when she is constipation. Denied recurrent UTI. Pt is able to empty urine ok, when she is more constipated , she feels she does have more difficulty emptying completely.   2)  Constipation:  Pt noticed when she is constipated, she notices more prolapse and urgency.  If she is good about taking Miralax, pt has BMs daily and does not strain. When she remembers, she takes Miralax 3-4 x week. Without Miralax, pt has stool consistency Type 2-3.   With Miralax, consistency is a Type 4.   Daily water intake: 70 oz a day water when working,  60 fl oz of water when she not working, Pt is trying to add more fiber.   Pt had constipation issues on and off for a long time. Pt had 2 children with 2nd degree perineal scars.   3) SUI: pt has leakage with laughing, coughing, sneezing. Pt empties frequently just in case before leaving to activities. Pt avoids lots of activities with fear of leaking, with kids activities.   4) intermittent LBP :   when lying on her back at night in bed , 4/10 pain,central location , nonradiating.   5) L knee pain with sitting for 10 min to 1 hour : located at the back for knee which causes her pain when she stands up after sitting .  Pain 4-5/10 .  Resolves quickly.   6) R shoulder tightness: with ER/ IR   PERTINENT  HISTORY:  Pt had 2 children with 2nd degree perineal scars. Hx of Hypermobility . 2nd child weighs 25 lbs and she is needing to carried by pt. Pt has tried kegel exercise sbut she felt dyscoordinated at her abs.  Pt currently wears a shoe lift in her R shoe.  PAIN:  Are you having pain? Yes:   PRECAUTIONS: None  WEIGHT BEARING RESTRICTIONS: No  FALLS:  Has patient fallen in last 6 months? No  LIVING ENVIRONMENT: Lives with: lives with their family Lives in: House/apartment Stairs: 2 story    OCCUPATION: physician   PLOF: Independent  PATIENT GOALS:  Try to commit to 15 min to lunch break to PT HEP and while kids are napping,  and to keep accountability for Pelvic PT HEP   Pt would like to not pee on herself and avoid prolapse surgery  Not leak with swimming    OBJECTIVE:    [facility] PT Assessment - 05/04/23 0845       Palpation   SI assessment  with shoe lift ( L iliac crest higher).  Without : levelled pelvic girdle,  seated levelled pelvic girdle.  in standing and seated: R shoulder higher, convex curve thoracic    Palpation comment deviated R T2, T7-10, tightness along interspinals, intercostals R posterior/ anterior / lateral             [facility] Adult PT Treatment/Exercise - 05/04/23 0847       Therapeutic Activites    Other Therapeutic Activities discussed Elige Radon loops explanation and to avoid urge suppression with the first urge to urination, do not delay first urge  and to fully relax to empy completely      Neuro Re-ed    Neuro Re-ed Details  cued for R sided HEP and B angel wings to promote levelled shoulder alignment  and spinal alignment      Manual Therapy   Manual therapy comments STM/MWM along areas noted in assessment                HOME EXERCISE PROGRAM: See pt instruction section    ASSESSMENT:  CLINICAL IMPRESSION   Addressed misalignment of spine and pelvis.  Suspect scoliosis is not related leg length difference and advised removal  of shoe lift this week. Deviation of C/T junction was noted upon assessment today with R shoulder elevation while pelvic alignment remained levelled after shoe lift was removed in both standing and seated positions.   Manual Tx was applied today to mobilize C/T spine and realign deviated segments. Pt showed more levelled shoulder alignment but will still require more skilled PT to further minimize mm tightness along spine.   Advised removal of shoe lift this week to test hypothesize for addressing scoliotic thoracic spine.   Plan to advance to cervicoscapular strengthening at next session.     Discussed Elige Radon loops and explained to not avoid first urge to urinate.    Pt benefits from skilled PT.    OBJECTIVE IMPAIRMENTS decreased activity tolerance, decreased coordination, decreased endurance, decreased mobility, difficulty walking, decreased ROM, decreased strength, decreased safety awareness, hypomobility, increased muscle spasms, impaired flexibility, improper body mechanics, postural dysfunction, and pain. scar restrictions   ACTIVITY LIMITATIONS  self-care,  sleep, home chores, work tasks    PARTICIPATION LIMITATIONS:  community, gym activities    PERSONAL FACTORS     are also affecting patient's functional outcome.    REHAB POTENTIAL: Good   CLINICAL DECISION MAKING: Evolving/moderate complexity   EVALUATION COMPLEXITY: Moderate    PATIENT EDUCATION:    Education details: Showed pt anatomy images. Explained muscles attachments/ connection, physiology of deep core system/ spinal- thoracic-pelvis-lower kinetic chain as they relate to pt's presentation, Sx, and past Hx. Explained what and how these areas of deficits need to be restored to balance and function    See Therapeutic activity / neuromuscular re-education section  Answered pt's questions.   Person educated: Patient Education method: Explanation, Demonstration, Tactile cues, Verbal cues, and Handouts Education  comprehension: verbalized understanding, returned demonstration, verbal cues required, tactile cues required, and needs further education     PLAN: PT FREQUENCY: 1x/week   PT DURATION: 10 weeks   PLANNED INTERVENTIONS: Therapeutic exercises, Therapeutic activity, Neuromuscular re-education, Balance training, Gait training, Patient/Family education, Self Care, Joint mobilization, Spinal mobilization, Moist heat, Taping, and Manual therapy, dry needling.   PLAN FOR NEXT SESSION: See clinical impression for plan     GOALS: Goals reviewed with patient? Yes  SHORT TERM GOALS: Target date: 05/26/2023  Pt will demo IND with HEP                    Baseline: Not IND            Goal status: INITIAL   LONG TERM GOALS: Target date: 07/06/2023    1.Pt will demo proper deep core coordination without chest breathing and optimal excursion of diaphragm/pelvic floor in order to promote spinal stability and pelvic floor function   Baseline: dyscoordination Goal status: INITIAL  2.  Pt will demo > 5 pt change on FOTO  to improve QOL and function  PFDI Urinary baseline - 25 Lower score = better function  Urinary Problem baseline- 52  Higher score = better function  Prolapse-4  lower score = better function  Lumber baseline  - 83 Higher score = better  function   Goal status: INITIAL  3.  Pt will demo proper body mechanics in against gravity tasks and ADLs  work tasks, fitness  to minimize straining pelvic floor / back                  Baseline: not IND, improper form that places strain on pelvic floor                Goal status: INITIAL   4. Pt will demo decreased separation of diastasis to < 2 fingers width in order to promote stronger intraabdominal pressure system for spinal / postural stability and pelvic floor function  Baseline: 3 fingers width before head lift, 1 fingers width after  Goal status: INITIAL  5. Pt will demo increased strength for R LE and B hip abd to > 4/5 to  promote postural stability and less leakage Baseline: RLE 4/5, L 5/5 , R hip ab 3/5  Goal status: INITIAL                          6. Pt will report Stool type 4 when she does not take Miralax in order to minimize straining and worsening prolapse.                         Baseline: When she remembers, she takes Miralax 3-4 x week. Without Miralax, pt has stool consistency Type 2-3.   With Miralax, consistency is a Type 4.                           Goal Status: Initial     7. Pt will demo proper upward movement of pelvic floor prior to simulated cough in order to improve SUI                         Baseline: not able to minimize SUI                         Goal Status: Initial                          8. Pt will be IND with scoliosis specific HEP to minimize R shoulder pain with ER/IR and L knee pain  and to maintain levelled alignment of pelvis and spine                         Baseline:  Shoulder pain with ER/IR ,  L knee pain with sitting for 10 min to 1 hour :                        Goal Status: INITIAL    9. Pt will be IND with deep core level 1-2 HEP to minimize DRA, LBP and promote continence Baseline:intermittent LBP :   when lying on her back at night in bed , 4/10 pain,  Pt avoids lots of activities with fear of leaking, with kids activities.  Goal Status: INITIAL   Mariane Masters, PT 05/04/2023, 8:15 AM

## 2023-05-04 NOTE — Patient Instructions (Addendum)
Wound Care Instructions  Cleanse wound gently with soap and water once a day then pat dry with clean gauze. Apply a thin coat of Petrolatum (petroleum jelly, "Vaseline") over the wound (unless you have an allergy to this). We recommend that you use a new, sterile tube of Vaseline. Do not pick or remove scabs. Do not remove the yellow or white "healing tissue" from the base of the wound.  Cover the wound with fresh, clean, nonstick gauze and secure with paper tape. You may use Band-Aids in place of gauze and tape if the wound is small enough, but would recommend trimming much of the tape off as there is often too much. Sometimes Band-Aids can irritate the skin.  You should call the office for your biopsy report after 1 week if you have not already been contacted.  If you experience any problems, such as abnormal amounts of bleeding, swelling, significant bruising, significant pain, or evidence of infection, please call the office immediately.  FOR ADULT SURGERY PATIENTS: If you need something for pain relief you may take 1 extra strength Tylenol (acetaminophen) AND 2 Ibuprofen (200mg each) together every 4 hours as needed for pain. (do not take these if you are allergic to them or if you have a reason you should not take them.) Typically, you may only need pain medication for 1 to 3 days.     Due to recent changes in healthcare laws, you may see results of your pathology and/or laboratory studies on MyChart before the doctors have had a chance to review them. We understand that in some cases there may be results that are confusing or concerning to you. Please understand that not all results are received at the same time and often the doctors may need to interpret multiple results in order to provide you with the best plan of care or course of treatment. Therefore, we ask that you please give us 2 business days to thoroughly review all your results before contacting the office for clarification. Should  we see a critical lab result, you will be contacted sooner.   If You Need Anything After Your Visit  If you have any questions or concerns for your doctor, please call our main line at [phone] and press option 4 to reach your doctor's medical assistant. If no one answers, please leave a voicemail as directed and we will return your call as soon as possible. Messages left after 4 pm will be answered the following business day.   You may also send us a message via MyChart. We typically respond to MyChart messages within 1-2 business days.  For prescription refills, please ask your pharmacy to contact our office. Our fax number is [phone].  If you have an urgent issue when the clinic is closed that cannot wait until the next business day, you can page your doctor at the number below.    Please note that while we do our best to be available for urgent issues outside of office hours, we are not available 24/7.   If you have an urgent issue and are unable to reach us, you may choose to seek medical care at your doctor's office, retail clinic, urgent care center, or emergency room.  If you have a medical emergency, please immediately call 911 or go to the emergency department.  Pager Numbers  - Dr. Kowalski: [phone]  - Dr. Moye: [phone]  - Dr. Stewart: [phone]  In the event of inclement weather, please call our main line at   [phone] for an update on the status of any delays or closures.  Dermatology Medication Tips: Please keep the boxes that topical medications come in in order to help keep track of the instructions about where and how to use these. Pharmacies typically print the medication instructions only on the boxes and not directly on the medication tubes.   If your medication is too expensive, please contact our office at [phone] option 4 or send us a message through MyChart.   We are unable to tell what your co-pay for medications will be in  advance as this is different depending on your insurance coverage. However, we may be able to find a substitute medication at lower cost or fill out paperwork to get insurance to cover a needed medication.   If a prior authorization is required to get your medication covered by your insurance company, please allow us 1-2 business days to complete this process.  Drug prices often vary depending on where the prescription is filled and some pharmacies may offer cheaper prices.  The website www.goodrx.com contains coupons for medications through different pharmacies. The prices here do not account for what the cost may be with help from insurance (it may be cheaper with your insurance), but the website can give you the price if you did not use any insurance.  - You can print the associated coupon and take it with your prescription to the pharmacy.  - You may also stop by our office during regular business hours and pick up a GoodRx coupon card.  - If you need your prescription sent electronically to a different pharmacy, notify our office through [place] MyChart or by phone at [phone] option 4.     Si Usted Necesita Algo Despus de Su Visita  Tambin puede enviarnos un mensaje a travs de MyChart. Por lo general respondemos a los mensajes de MyChart en el transcurso de 1 a 2 das hbiles.  Para renovar recetas, por favor pida a su farmacia que se ponga en contacto con nuestra oficina. Nuestro nmero de fax es el [phone].  Si tiene un asunto urgente cuando la clnica est cerrada y que no puede esperar hasta el siguiente da hbil, puede llamar/localizar a su doctor(a) al nmero que aparece a continuacin.   Por favor, tenga en cuenta que aunque hacemos todo lo posible para estar disponibles para asuntos urgentes fuera del horario de oficina, no estamos disponibles las 24 horas del da, los 7 das de la semana.   Si tiene un problema urgente y no puede comunicarse con nosotros, puede  optar por buscar atencin mdica  en el consultorio de su doctor(a), en una clnica privada, en un centro de atencin urgente o en una sala de emergencias.  Si tiene una emergencia mdica, por favor llame inmediatamente al 911 o vaya a la sala de emergencias.  Nmeros de bper  - Dr. Kowalski: [phone]  - Dra. Moye: [phone]  - Dra. Stewart: [phone]  En caso de inclemencias del tiempo, por favor llame a nuestra lnea principal al [phone] para una actualizacin sobre el estado de cualquier retraso o cierre.  Consejos para la medicacin en dermatologa: Por favor, guarde las cajas en las que vienen los medicamentos de uso tpico para ayudarle a seguir las instrucciones sobre dnde y cmo usarlos. Las farmacias generalmente imprimen las instrucciones del medicamento slo en las cajas y no directamente en los tubos del medicamento.   Si su medicamento es muy caro, por favor, pngase en contacto con   nuestra oficina llamando al [phone] y presione la opcin 4 o envenos un mensaje a travs de MyChart.   No podemos decirle cul ser su copago por los medicamentos por adelantado ya que esto es diferente dependiendo de la cobertura de su seguro. Sin embargo, es posible que podamos encontrar un medicamento sustituto a menor costo o llenar un formulario para que el seguro cubra el medicamento que se considera necesario.   Si se requiere una autorizacin previa para que su compaa de seguros cubra su medicamento, por favor permtanos de 1 a 2 das hbiles para completar este proceso.  Los precios de los medicamentos varan con frecuencia dependiendo del lugar de dnde se surte la receta y alguna farmacias pueden ofrecer precios ms baratos.  El sitio web www.goodrx.com tiene cupones para medicamentos de diferentes farmacias. Los precios aqu no tienen en cuenta lo que podra costar con la ayuda del seguro (puede ser ms barato con su seguro), pero el sitio web puede darle el  precio si no utiliz ningn seguro.  - Puede imprimir el cupn correspondiente y llevarlo con su receta a la farmacia.  - Tambin puede pasar por nuestra oficina durante el horario de atencin regular y recoger una tarjeta de cupones de GoodRx.  - Si necesita que su receta se enve electrnicamente a una farmacia diferente, informe a nuestra oficina a travs de MyChart de [place] o por telfono llamando al [phone] y presione la opcin 4.  

## 2023-05-04 NOTE — Progress Notes (Signed)
Follow-Up Visit   Subjective  Lisa Brady is a 37 y.o. female who presents for the following: Skin Cancer Screening and Full Body Skin Exam, hx of Dysplastic nevus.  The patient presents for Total-Body Skin Exam (TBSE) for skin cancer screening and mole check. The patient has spots, moles and lesions to be evaluated, some may be new or changing and the patient has concerns that these could be cancer.  The following portions of the chart were reviewed this encounter and updated as appropriate: medications, allergies, medical history  Review of Systems:  No other skin or systemic complaints except as noted in HPI or Assessment and Plan.  Objective  Well appearing patient in no apparent distress; mood and affect are within normal limits.  A full examination was performed including scalp, head, eyes, ears, nose, lips, neck, chest, axillae, abdomen, back, buttocks, bilateral upper extremities, bilateral lower extremities, hands, feet, fingers, toes, fingernails, and toenails. All findings within normal limits unless otherwise noted below.   Relevant physical exam findings are noted in the Assessment and Plan.  right mid med scapula 0.1 cm brown macule    Assessment & Plan   LENTIGINES, SEBORRHEIC KERATOSES, HEMANGIOMAS - Benign normal skin lesions - Benign-appearing - Call for any changes  MELANOCYTIC NEVI - Tan-brown and/or pink-flesh-colored symmetric macules and papules - Benign appearing on exam today - Observation - Call clinic for new or changing moles - Recommend daily use of broad spectrum spf 30+ sunscreen to sun-exposed areas.   ACTINIC DAMAGE - Chronic condition, secondary to cumulative UV/sun exposure - diffuse scaly erythematous macules with underlying dyspigmentation - Recommend daily broad spectrum sunscreen SPF 30+ to sun-exposed areas, reapply every 2 hours as needed.  - Staying in the shade or wearing long sleeves, sun glasses (UVA+UVB protection) and wide  brim hats (4-inch brim around the entire circumference of the hat) are also recommended for sun protection.  - Call for new or changing lesions.  Seborrheic dermatitis / Sebopsoriasis Scalp/eyelids  Chronic and persistent condition with duration or expected duration over one year. Condition is symptomatic / bothersome to patient. Not to goal. Start Ketoconazole 2% shampoo apply three times per week, massage into scalp and leave in for 10 minutes before rinsing out  Use otc dandruff shampoo 3 days a week  Start Ketoconazole cream apply to eyelids Mon, Wed, Fri Start Pimecrolimus apply to eyelids Tues, Thus, Sat   Keratosis pilaris Bilateral tricep areas Benign-appearing.  Observation.  Call clinic for new or changing lesions.  Recommend daily use of broad spectrum spf 30+ sunscreen to sun-exposed areas.   HISTORY OF DYSPLASTIC NEVUS No evidence of recurrence today Recommend regular full body skin exams Recommend daily broad spectrum sunscreen SPF 30+ to sun-exposed areas, reapply every 2 hours as needed.  Call if any new or changing lesions are noted between office visits   SKIN CANCER SCREENING PERFORMED TODAY.  Neoplasm of skin right mid med scapula  Epidermal / dermal shaving  Lesion diameter (cm):  0.1 Informed consent: discussed and consent obtained   Timeout: patient name, date of birth, surgical site, and procedure verified   Procedure prep:  Patient was prepped and draped in usual sterile fashion Prep type:  Isopropyl alcohol Anesthesia: the lesion was anesthetized in a standard fashion   Anesthetic:  1% lidocaine w/ epinephrine 1-100,000 buffered w/ 8.4% NaHCO3 Hemostasis achieved with: pressure, aluminum chloride and electrodesiccation   Outcome: patient tolerated procedure well   Post-procedure details: sterile dressing applied and wound  care instructions given   Dressing type: bandage and petrolatum    Specimen 1 - Surgical pathology Differential Diagnosis:  Recurrent Dysplastic nevus   Check Margins: No   Return in about 1 year (around 05/03/2024) for TBSE, hx of Dysplastic nevus .  IAngelique Holm, CMA, am acting as scribe for Armida Sans, MD .   Documentation: I have reviewed the above documentation for accuracy and completeness, and I agree with the above.  Armida Sans, MD

## 2023-05-11 ENCOUNTER — Encounter: Payer: Self-pay | Admitting: Dermatology

## 2023-05-11 ENCOUNTER — Telehealth: Payer: Self-pay

## 2023-05-11 NOTE — Telephone Encounter (Signed)
-----   Message from Deirdre Evener, MD sent at 05/10/2023  8:50 PM EDT ----- Diagnosis Skin , right mid med scapula RECURRENT DYSPLASTIC NEVUS, LIMITED MARGINS FREE  Recurrent dysplastic nevus Recheck next visit

## 2023-05-11 NOTE — Telephone Encounter (Signed)
Discussed biopsy results with patient 

## 2023-05-18 ENCOUNTER — Ambulatory Visit: Payer: BC Managed Care – PPO | Attending: Family | Admitting: Physical Therapy

## 2023-05-18 DIAGNOSIS — M21372 Foot drop, left foot: Secondary | ICD-10-CM | POA: Diagnosis present

## 2023-05-18 DIAGNOSIS — M533 Sacrococcygeal disorders, not elsewhere classified: Secondary | ICD-10-CM | POA: Insufficient documentation

## 2023-05-18 DIAGNOSIS — M62838 Other muscle spasm: Secondary | ICD-10-CM | POA: Insufficient documentation

## 2023-05-18 DIAGNOSIS — R278 Other lack of coordination: Secondary | ICD-10-CM | POA: Diagnosis present

## 2023-05-18 NOTE — Patient Instructions (Signed)
Place band in "U"    band under ballmounds  while laying on back w/ knees bent   Lying on back, knees bent    band under ballmounds  while laying on back w/ knees bent  "W" exercise  10 reps x 2 sets   Band is placed under feet, knees bent, feet are hip width apart Hold band with thumbs point out, keep upper arm and elbow touching the bed the whole time  - inhale and then exhale pull bands by bending elbows hands move in a "w"  (feel shoulder blades squeezing)   ________________   Oblique/ scapula stabilization   CROSS ARM   Opposite arm   Place band in "U"    band under ballmounds  while laying on back w/ knees bent     20 reps  on each side  Holding band from opposite thigh,  Inhale,    exhale then pull band across body while keeping elbow , shoulders, back of the head pressed down    ______________

## 2023-05-18 NOTE — Therapy (Signed)
OUTPATIENT PHYSICAL THERAPY TREATMENT   Patient Name: Atheena Spano MRN: [id number] DOB:03-28-1986, 37 y.o., female Today's Date: 05/18/2023   PT End of Session - 05/18/23 1025     Visit Number 3    Number of Visits 10    Date for PT Re-Evaluation 07/06/23    PT Start Time 1023    PT Stop Time 1105    PT Time Calculation (min) 42 min    Activity Tolerance Patient tolerated treatment well;No increased pain    Behavior During Therapy [facility] for tasks assessed/performed             Past Medical History:  Diagnosis Date   Choroid plexus cyst of fetus on prenatal ultrasound 05/29/2021   Dysplastic nevus 04/01/2021   right medial forearm, mod Atypia    Dysplastic nevus 04/01/2021   right inferior axilla, mild atypia    Dysplastic nevus 04/01/2021   right superior medial scapula, mild atypia    Dysplastic nevus 05/05/2022   Right mid med scapula - moderate   Dysplastic nevus 05/05/2022   Right mid to sup buttock - moderate   Dysplastic nevus 05/04/2023   right mid med scapula- recurrent   Foot drop    History of left foot drop 08/17/2019   History of vacuum extraction assisted delivery 04/03/2021   Hypermobility arthralgia    Maternal varicella, non-immune 11/20/2018   Scoliosis    Urinary incontinence    Wears glasses    Past Surgical History:  Procedure Laterality Date   WISDOM TOOTH EXTRACTION  2006   Patient Active Problem List   Diagnosis Date Noted   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   Eyelid dermatitis, allergic/contact 02/11/2023   Hypermobility arthralgia 02/25/2021   Chronic pain of right knee 09/17/2020   Urinary incontinence 08/17/2019    PCP: Chestine Spore   REFERRING PROVIDER: Alfonse Alpers  REFERRING DIAG: Mixed stress and urge urinary incontinence   Rationale for Evaluation and Treatment Rehabilitation  THERAPY DIAG:  Other muscle spasm  Sacrococcygeal disorders, not elsewhere classified  Foot drop, left  Other lack of coordination  ONSET DATE:    SUBJECTIVE:           SUBJECTIVE STATEMENT:                                                     Pt reported she has removed her shoe lift for the past 2 weeks.  LBP and knee pain subsided since last session.  Urge in continence did not occur the past weeks. Only SUI episodes. R shoulder is painful when sleeping and turning over   SUBJECTIVE STATEMENT ON EVAL 04/27/23 : Pt had urethral bulking one year ago. It helped with her incontinence which is why she did not keep up with her PT exercises. But the Tx did not help with prolapse.  1) prolapse Sx:  Pt had a long lapse between Pelvic PT.  Pt notices more pressure sensation and more urgency when she is constipation. Denied recurrent UTI. Pt is able to empty urine ok, when she is more constipated , she feels she does have more difficulty emptying completely.   2)  Constipation:  Pt noticed when she is constipated, she notices more prolapse and urgency.  If she is good about taking Miralax, pt has BMs daily and does not strain. When she remembers, she takes  Miralax 3-4 x week. Without Miralax, pt has stool consistency Type 2-3.   With Miralax, consistency is a Type 4.   Daily water intake: 70 oz a day water when working,  60 fl oz of water when she not working, Pt is trying to add more fiber.   Pt had constipation issues on and off for a long time. Pt had 2 children with 2nd degree perineal scars.   3) SUI: pt has leakage with laughing, coughing, sneezing. Pt empties frequently just in case before leaving to activities. Pt avoids lots of activities with fear of leaking, with kids activities.   4) intermittent LBP :   when lying on her back at night in bed , 4/10 pain,central location , nonradiating.   5) L knee pain with sitting for 10 min to 1 hour : located at the back for knee which causes her pain when she stands up after sitting .  Pain 4-5/10 .  Resolves quickly.   6) R shoulder tightness: with ER/ IR   PERTINENT HISTORY:  Pt had 2  children with 2nd degree perineal scars. Hx of Hypermobility . 2nd child weighs 25 lbs and she is needing to carried by pt. Pt has tried kegel exercise sbut she felt dyscoordinated at her abs.  Pt currently wears a shoe lift in her R shoe.  PAIN:  Are you having pain? Yes:   PRECAUTIONS: None  WEIGHT BEARING RESTRICTIONS: No  FALLS:  Has patient fallen in last 6 months? No  LIVING ENVIRONMENT: Lives with: lives with their family Lives in: House/apartment Stairs: 2 story    OCCUPATION: physician   PLOF: Independent  PATIENT GOALS:  Try to commit to 15 min to lunch break to PT HEP and while kids are napping,  and to keep accountability for Pelvic PT HEP   Pt would like to not pee on herself and avoid prolapse surgery  Not leak with swimming    OBJECTIVE:     [facility] PT Assessment -       Palpation   Palpation comment L T3,5 deviated, R intercostals/ scapular mm tightness     Observation:  poor scapular stabilization with RUE          [facility] Adult PT Treatment/Exercise -       Neuro Re-ed    Neuro Re-ed Details  excessive cues for stability and cervicoscapular stabilization with new resistance band HEP      Manual Therapy   Manual therapy comments STM/MWM along areas noted in assessment  to realign spine and promote R scapular mobility / decrease pain                HOME EXERCISE PROGRAM: See pt instruction section    ASSESSMENT:  CLINICAL IMPRESSION   Spine and pelvis showed equal alignment since she removed her shoe lift.  LBP and knee pain subsided since last session.   Addressed her neck and shoulder on R which bothers her.   Manual Tx was applied today to mobilize C/T spine and realign deviated segments and mobilize R scapular movement.    Excessive cues for stabilization and proper coordination of R UE in new resistance band HEP.    Plan to advance to cervicoscapular / thoracolumbar strengthening and deep core strengthening at next session.      Pt benefits from skilled PT.    OBJECTIVE IMPAIRMENTS decreased activity tolerance, decreased coordination, decreased endurance, decreased mobility, difficulty walking, decreased ROM, decreased strength, decreased safety awareness, hypomobility, increased  muscle spasms, impaired flexibility, improper body mechanics, postural dysfunction, and pain. scar restrictions   ACTIVITY LIMITATIONS  self-care,  sleep, home chores, work tasks    PARTICIPATION LIMITATIONS:  community, gym activities    PERSONAL FACTORS     are also affecting patient's functional outcome.    REHAB POTENTIAL: Good   CLINICAL DECISION MAKING: Evolving/moderate complexity   EVALUATION COMPLEXITY: Moderate    PATIENT EDUCATION:    Education details: Showed pt anatomy images. Explained muscles attachments/ connection, physiology of deep core system/ spinal- thoracic-pelvis-lower kinetic chain as they relate to pt's presentation, Sx, and past Hx. Explained what and how these areas of deficits need to be restored to balance and function    See Therapeutic activity / neuromuscular re-education section  Answered pt's questions.   Person educated: Patient Education method: Explanation, Demonstration, Tactile cues, Verbal cues, and Handouts Education comprehension: verbalized understanding, returned demonstration, verbal cues required, tactile cues required, and needs further education     PLAN: PT FREQUENCY: 1x/week   PT DURATION: 10 weeks   PLANNED INTERVENTIONS: Therapeutic exercises, Therapeutic activity, Neuromuscular re-education, Balance training, Gait training, Patient/Family education, Self Care, Joint mobilization, Spinal mobilization, Moist heat, Taping, and Manual therapy, dry needling.   PLAN FOR NEXT SESSION: See clinical impression for plan     GOALS: Goals reviewed with patient? Yes  SHORT TERM GOALS: Target date: 05/26/2023  Pt will demo IND with HEP                    Baseline: Not IND             Goal status: INITIAL   LONG TERM GOALS: Target date: 07/06/2023    1.Pt will demo proper deep core coordination without chest breathing and optimal excursion of diaphragm/pelvic floor in order to promote spinal stability and pelvic floor function   Baseline: dyscoordination Goal status: INITIAL  2.  Pt will demo > 5 pt change on FOTO  to improve QOL and function  PFDI Urinary baseline - 25 Lower score = better function  Urinary Problem baseline- 52  Higher score = better function  Prolapse-4  lower score = better function  Lumber baseline  - 83 Higher score = better function   Goal status: INITIAL  3.  Pt will demo proper body mechanics in against gravity tasks and ADLs  work tasks, fitness  to minimize straining pelvic floor / back                  Baseline: not IND, improper form that places strain on pelvic floor                Goal status: INITIAL   4. Pt will demo decreased separation of diastasis to < 2 fingers width in order to promote stronger intraabdominal pressure system for spinal / postural stability and pelvic floor function  Baseline: 3 fingers width before head lift, 1 fingers width after  Goal status: INITIAL  5. Pt will demo increased strength for R LE and B hip abd to > 4/5 to promote postural stability and less leakage Baseline: RLE 4/5, L 5/5 , R hip ab 3/5  Goal status: INITIAL                          6. Pt will report Stool type 4 when she does not take Miralax in order to minimize straining and worsening prolapse.  Baseline: When she remembers, she takes Miralax 3-4 x week. Without Miralax, pt has stool consistency Type 2-3.   With Miralax, consistency is a Type 4.                           Goal Status: Initial     7. Pt will demo proper upward movement of pelvic floor prior to simulated cough in order to improve SUI                         Baseline: not able to minimize SUI                         Goal Status:  Initial                          8. Pt will be IND with scoliosis specific HEP to minimize R shoulder pain with ER/IR and L knee pain  and to maintain levelled alignment of pelvis and spine                         Baseline:  Shoulder pain with ER/IR ,  L knee pain with sitting for 10 min to 1 hour :                        Goal Status: INITIAL    9. Pt will be IND with deep core level 1-2 HEP to minimize DRA, LBP and promote continence Baseline:intermittent LBP :   when lying on her back at night in bed , 4/10 pain,  Pt avoids lots of activities with fear of leaking, with kids activities.  Goal Status: INITIAL   Mariane Masters, PT 05/18/2023, 10:26 AM

## 2023-05-20 ENCOUNTER — Ambulatory Visit: Payer: BC Managed Care – PPO | Admitting: Physical Therapy

## 2023-05-27 ENCOUNTER — Ambulatory Visit: Payer: BC Managed Care – PPO | Admitting: Physical Therapy

## 2023-05-27 DIAGNOSIS — M62838 Other muscle spasm: Secondary | ICD-10-CM | POA: Diagnosis not present

## 2023-05-27 DIAGNOSIS — M21372 Foot drop, left foot: Secondary | ICD-10-CM

## 2023-05-27 DIAGNOSIS — R278 Other lack of coordination: Secondary | ICD-10-CM

## 2023-05-27 DIAGNOSIS — M533 Sacrococcygeal disorders, not elsewhere classified: Secondary | ICD-10-CM

## 2023-05-27 NOTE — Patient Instructions (Addendum)
Pool exercises: while gently pulling baby in her float   Side lunges  Front lunge ( hip width apart are the feet)  Back lunges with slight lean forward   __  Multifidis twist   Band is on doorknob: sit facing perpendicular to door , sit halfway towards front of chair, firm through 4 points of contact at buttocks and feet. Feet are placed hip with apart.   Twisting trunk without moving the hips and knees Hold band at the level of ribcage, elbows bent,shoulder blades roll back and down like squeezing a pencil under armpit   Exhale twist,.10-15 deg away from door without moving your hips/ knees, press more weight on the side of the sitting bones/ foot opp of your direction of turn as your counterweight. Continue to maintain equal weight through legs.  Keep knee unlocked.  30 reps   --   Minisquat: Scoot buttocks back slight, hinge like you are looking at your reflection on a pond  Knees behind toes, slightly in a V  Inhale to "smell flowers"  Exhale on the rise "like rocket"  Do not lock knees, have more weight across ballmounds of feet, toes relaxed and spread them, not grip them   10 reps x 3 x day

## 2023-05-27 NOTE — Therapy (Signed)
OUTPATIENT PHYSICAL THERAPY TREATMENT   Patient Name: Lisa Brady MRN: [id number] DOB:Dec 30, 1985, 37 y.o., female Today's Date: 05/27/2023   PT End of Session - 05/27/23 0811     Visit Number 4    Number of Visits 10    Date for PT Re-Evaluation 07/06/23    PT Start Time 0800    PT Stop Time 0845    PT Time Calculation (min) 45 min    Activity Tolerance Patient tolerated treatment well;No increased pain    Behavior During Therapy [facility] for tasks assessed/performed             Past Medical History:  Diagnosis Date   Choroid plexus cyst of fetus on prenatal ultrasound 05/29/2021   Dysplastic nevus 04/01/2021   right medial forearm, mod Atypia    Dysplastic nevus 04/01/2021   right inferior axilla, mild atypia    Dysplastic nevus 04/01/2021   right superior medial scapula, mild atypia    Dysplastic nevus 05/05/2022   Right mid med scapula - moderate   Dysplastic nevus 05/05/2022   Right mid to sup buttock - moderate   Dysplastic nevus 05/04/2023   right mid med scapula- recurrent   Foot drop    History of left foot drop 08/17/2019   History of vacuum extraction assisted delivery 04/03/2021   Hypermobility arthralgia    Maternal varicella, non-immune 11/20/2018   Scoliosis    Urinary incontinence    Wears glasses    Past Surgical History:  Procedure Laterality Date   WISDOM TOOTH EXTRACTION  2006   Patient Active Problem List   Diagnosis Date Noted   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  ONSET DATE:   SUBJECTIVE:           SUBJECTIVE STATEMENT:                                                      Pt reported she has removed her shoe lift for the past 2 weeks.  Pt feels L knee pain returned after one week of resolved pain.  It hurts when she has been sitting for awhile and is getting up from sitting to stand.     SUBJECTIVE STATEMENT ON EVAL 04/27/23 : Pt had urethral bulking one year ago. It helped with her incontinence which is why she did not keep up with her PT exercises. But the Tx did not help with prolapse.  1) prolapse Sx:  Pt had a long lapse between Pelvic PT.  Pt notices more pressure sensation and more urgency when she is constipation. Denied recurrent UTI. Pt is able to empty urine ok, when she is more constipated , she feels she does have more difficulty emptying completely.   2)  Constipation:  Pt noticed when she is constipated, she notices more prolapse and urgency.  If she is good about taking Miralax, pt has BMs daily and does not strain. When she remembers, she takes Miralax 3-4 x week. Without Miralax, pt has stool consistency Type 2-3.  With Miralax, consistency is a Type 4.   Daily water intake: 70 oz a day water when working,  60 fl oz of water when she not working, Pt is trying to add more fiber.   Pt had constipation issues on and off for a long time. Pt had 2 children with 2nd degree perineal scars.   3) SUI: pt has leakage with laughing, coughing, sneezing. Pt empties frequently just in case before leaving to activities. Pt avoids lots of activities with fear of leaking, with kids activities.   4) intermittent LBP :   when lying on her back at night in bed , 4/10 pain,central location , nonradiating.   5) L knee pain with sitting for 10 min to 1 hour : located at the back for knee which causes her pain when she stands up after sitting .  Pain 4-5/10 .  Resolves quickly.   6) R shoulder tightness: with ER/ IR   PERTINENT HISTORY:  Pt had 2 children with 2nd degree perineal scars. Hx of Hypermobility . 2nd child weighs 25 lbs and she is needing to carried by  pt. Pt has tried kegel exercise sbut she felt dyscoordinated at her abs.  Pt currently wears a shoe lift in her R shoe.  PAIN:  Are you having pain? Yes:   PRECAUTIONS: None  WEIGHT BEARING RESTRICTIONS: No  FALLS:  Has patient fallen in last 6 months? No  LIVING ENVIRONMENT: Lives with: lives with their family Lives in: House/apartment Stairs: 2 story    OCCUPATION: physician   PLOF: Independent  PATIENT GOALS:  Try to commit to 15 min to lunch break to PT HEP and while kids are napping,  and to keep accountability for Pelvic PT HEP   Pt would like to not pee on herself and avoid prolapse surgery  Not leak with swimming    OBJECTIVE:    [facility] PT Assessment - 05/27/23 0815       Squat   Comments poor alignment , lacking hip ER/abd      Lunges   Comments IR of knee      Sit to Stand   Comments poor alignment      Strength   Overall Strength Comments PF MMT 5/5, unilateral UE support L 17 rep, 14 reps      Palpation   SI assessment  without shoe lift for 2 weeks:  levelled pelvic and shoulders  , R ASIS more anterior    Palpation comment L T3,5 deviated, R intercostals/ scapular mm tightness             [facility] Adult PT Treatment/Exercise - 05/27/23 0814       Therapeutic Activites    Other Therapeutic Activities explained use of pool therapy for strengthening SIJ , explained dissassociation between trunk and pelvis      Neuro Re-ed    Neuro Re-ed Details  cued for propioception of feet in latera, front, back ward lunges, squat, cued for multidifis twist in standing      Manual Therapy   Manual therapy comments STM/MWM  along areas noted in assessment realign deviated L T3 and 7                 HOME EXERCISE PROGRAM: See pt instruction section    ASSESSMENT:  CLINICAL IMPRESSION    Manual Tx was applied again today to mobilize C/T spine and realign deviated segments and mobilize R scapular movement.  Anticipate this will help with her  scapular stabilization HEP.   Excessive cues for trunk stability with LKC propioception and knee alignment. Cued for proper squat and sit to stand which will help with pt c/o knee pain.  Advanced her to multidifis strengthening to address R anteriorly rotation of R ilia.  Plan to advance to cervicoscapular / thoracolumbar strengthening and further progress [facility] propioception training  at next session.     Pt benefits from skilled PT.    OBJECTIVE IMPAIRMENTS decreased activity tolerance, decreased coordination, decreased endurance, decreased mobility, difficulty walking, decreased ROM, decreased strength, decreased safety awareness, hypomobility, increased muscle spasms, impaired flexibility, improper body mechanics, postural dysfunction, and pain. scar restrictions   ACTIVITY LIMITATIONS  self-care,  sleep, home chores, work tasks    PARTICIPATION LIMITATIONS:  community, gym activities    PERSONAL FACTORS     are also affecting patient's functional outcome.    REHAB POTENTIAL: Good   CLINICAL DECISION MAKING: Evolving/moderate complexity   EVALUATION COMPLEXITY: Moderate    PATIENT EDUCATION:    Education details: Showed pt anatomy images. Explained muscles attachments/ connection, physiology of deep core system/ spinal- thoracic-pelvis-lower kinetic chain as they relate to pt's presentation, Sx, and past Hx. Explained what and how these areas of deficits need to be restored to balance and function    See Therapeutic activity / neuromuscular re-education section  Answered pt's questions.   Person educated: Patient Education method: Explanation, Demonstration, Tactile cues, Verbal cues, and Handouts Education comprehension: verbalized understanding, returned demonstration, verbal cues required, tactile cues required, and needs further education     PLAN: PT FREQUENCY: 1x/week   PT DURATION: 10 weeks   PLANNED INTERVENTIONS: Therapeutic exercises, Therapeutic activity,  Neuromuscular re-education, Balance training, Gait training, Patient/Family education, Self Care, Joint mobilization, Spinal mobilization, Moist heat, Taping, and Manual therapy, dry needling.   PLAN FOR NEXT SESSION: See clinical impression for plan     GOALS: Goals reviewed with patient? Yes  SHORT TERM GOALS: Target date: 05/26/2023  Pt will demo IND with HEP                    Baseline: Not IND            Goal status: INITIAL   LONG TERM GOALS: Target date: 07/06/2023    1.Pt will demo proper deep core coordination without chest breathing and optimal excursion of diaphragm/pelvic floor in order to promote spinal stability and pelvic floor function   Baseline: dyscoordination Goal status: INITIAL  2.  Pt will demo > 5 pt change on FOTO  to improve QOL and function  PFDI Urinary baseline - 25 Lower score = better function  Urinary Problem baseline- 52  Higher score = better function  Prolapse-4  lower score = better function  Lumber baseline  - 83 Higher score = better function   Goal status: INITIAL  3.  Pt will demo proper body mechanics in against gravity tasks and ADLs  work tasks, fitness  to minimize straining pelvic floor / back                  Baseline: not IND, improper form that places strain on pelvic floor                Goal status: INITIAL   4. Pt will demo decreased separation of diastasis to < 2 fingers width in order to promote stronger intraabdominal pressure system for spinal / postural stability and pelvic floor function  Baseline: 3 fingers width before head  lift, 1 fingers width after  Goal status: INITIAL  5. Pt will demo increased strength for R LE and B hip abd to > 4/5 to promote postural stability and less leakage Baseline: RLE 4/5, L 5/5 , R hip ab 3/5  Goal status: INITIAL                          6. Pt will report Stool type 4 when she does not take Miralax in order to minimize straining and worsening prolapse.                          Baseline: When she remembers, she takes Miralax 3-4 x week. Without Miralax, pt has stool consistency Type 2-3.   With Miralax, consistency is a Type 4.                           Goal Status: Initial     7. Pt will demo proper upward movement of pelvic floor prior to simulated cough in order to improve SUI                         Baseline: not able to minimize SUI                         Goal Status: Initial                          8. Pt will be IND with scoliosis specific HEP to minimize R shoulder pain with ER/IR and L knee pain  and to maintain levelled alignment of pelvis and spine                         Baseline:  Shoulder pain with ER/IR ,  L knee pain with sitting for 10 min to 1 hour :                        Goal Status: INITIAL    9. Pt will be IND with deep core level 1-2 HEP to minimize DRA, LBP and promote continence Baseline:intermittent LBP :   when lying on her back at night in bed , 4/10 pain,  Pt avoids lots of activities with fear of leaking, with kids activities.  Goal Status: INITIAL   Mariane Masters, PT 05/27/2023, 8:16 AM

## 2023-06-03 ENCOUNTER — Ambulatory Visit: Payer: BC Managed Care – PPO | Admitting: Physical Therapy

## 2023-06-03 DIAGNOSIS — M62838 Other muscle spasm: Secondary | ICD-10-CM | POA: Diagnosis not present

## 2023-06-03 DIAGNOSIS — R278 Other lack of coordination: Secondary | ICD-10-CM

## 2023-06-03 DIAGNOSIS — M533 Sacrococcygeal disorders, not elsewhere classified: Secondary | ICD-10-CM

## 2023-06-03 DIAGNOSIS — M21372 Foot drop, left foot: Secondary | ICD-10-CM

## 2023-06-03 NOTE — Therapy (Signed)
OUTPATIENT PHYSICAL THERAPY TREATMENT   Patient Name: Lisa Brady MRN: [id number] DOB:1985-12-25, 37 y.o., female Today's Date: 06/03/2023   PT End of Session - 06/03/23 0806     Visit Number 5    Number of Visits 10    Date for PT Re-Evaluation 07/06/23    PT Start Time 0800    PT Stop Time 0845    PT Time Calculation (min) 45 min    Activity Tolerance Patient tolerated treatment well;No increased pain    Behavior During Therapy [facility] for tasks assessed/performed             Past Medical History:  Diagnosis Date   Choroid plexus cyst of fetus on prenatal ultrasound 05/29/2021   Dysplastic nevus 04/01/2021   right medial forearm, mod Atypia    Dysplastic nevus 04/01/2021   right inferior axilla, mild atypia    Dysplastic nevus 04/01/2021   right superior medial scapula, mild atypia    Dysplastic nevus 05/05/2022   Right mid med scapula - moderate   Dysplastic nevus 05/05/2022   Right mid to sup buttock - moderate   Dysplastic nevus 05/04/2023   right mid med scapula- recurrent   Foot drop    History of left foot drop 08/17/2019   History of vacuum extraction assisted delivery 04/03/2021   Hypermobility arthralgia    Maternal varicella, non-immune 11/20/2018   Scoliosis    Urinary incontinence    Wears glasses    Past Surgical History:  Procedure Laterality Date   WISDOM TOOTH EXTRACTION  2006   Patient Active Problem List   Diagnosis Date Noted   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   Eyelid dermatitis, allergic/contact 02/11/2023   Hypermobility arthralgia 02/25/2021   Chronic pain of right knee 09/17/2020   Urinary incontinence 08/17/2019    PCP: Chestine Spore   REFERRING PROVIDER: Alfonse Alpers  REFERRING DIAG: Mixed stress and urge urinary incontinence   Rationale for Evaluation and Treatment Rehabilitation  THERAPY DIAG:  Other muscle spasm  Foot drop, left  Sacrococcygeal disorders, not elsewhere classified  Other lack of coordination  ONSET DATE:    SUBJECTIVE:           SUBJECTIVE STATEMENT:                                                     Pt reported R shoulder still bothers her when sleeping on her R side.  PT had two urge incontinence episodes this past week. Pt noticed it occurs mostly when she is with her kids    SUBJECTIVE STATEMENT ON EVAL 04/27/23 : Pt had urethral bulking one year ago. It helped with her incontinence which is why she did not keep up with her PT exercises. But the Tx did not help with prolapse.  1) prolapse Sx:  Pt had a long lapse between Pelvic PT.  Pt notices more pressure sensation and more urgency when she is constipation. Denied recurrent UTI. Pt is able to empty urine ok, when she is more constipated , she feels she does have more difficulty emptying completely.   2)  Constipation:  Pt noticed when she is constipated, she notices more prolapse and urgency.  If she is good about taking Miralax, pt has BMs daily and does not strain. When she remembers, she takes Miralax 3-4 x week. Without Miralax, pt has stool  consistency Type 2-3.   With Miralax, consistency is a Type 4.   Daily water intake: 70 oz a day water when working,  60 fl oz of water when she not working, Pt is trying to add more fiber.   Pt had constipation issues on and off for a long time. Pt had 2 children with 2nd degree perineal scars.   3) SUI: pt has leakage with laughing, coughing, sneezing. Pt empties frequently just in case before leaving to activities. Pt avoids lots of activities with fear of leaking, with kids activities.   4) intermittent LBP :   when lying on her back at night in bed , 4/10 pain,central location , nonradiating.   5) L knee pain with sitting for 10 min to 1 hour : located at the back for knee which causes her pain when she stands up after sitting .  Pain 4-5/10 .  Resolves quickly.   6) R shoulder tightness: with ER/ IR   PERTINENT HISTORY:  Pt had 2 children with 2nd degree perineal scars. Hx of  Hypermobility . 2nd child weighs 25 lbs and she is needing to carried by pt. Pt has tried kegel exercise sbut she felt dyscoordinated at her abs.  Pt currently wears a shoe lift in her R shoe.  PAIN:  Are you having pain? Yes:   PRECAUTIONS: None  WEIGHT BEARING RESTRICTIONS: No  FALLS:  Has patient fallen in last 6 months? No  LIVING ENVIRONMENT: Lives with: lives with their family Lives in: House/apartment Stairs: 2 story    OCCUPATION: physician   PLOF: Independent  PATIENT GOALS:  Try to commit to 15 min to lunch break to PT HEP and while kids are napping,  and to keep accountability for Pelvic PT HEP   Pt would like to not pee on herself and avoid prolapse surgery  Not leak with swimming    OBJECTIVE:   [facility] PT Assessment - 06/03/23 1355       Observation/Other Assessments   Observations upright posture, less rounded shoulders      Sit to Stand   Comments less knee valgus      Palpation   SI assessment  R PSIS anteirorly rotated, thoracic R anterior rotation '  Pelvic levelled   Palpation comment no deviated T segments, intercostal mm tightness T10-12 R      Bed Mobility   Bed Mobility --   head lift technique, when cued for side lying: shoulder IR , poor alignment on RUE,            [facility] Adult PT Treatment/Exercise - 06/03/23 1355       Therapeutic Activites    Other Therapeutic Activities explained nn system with urge incontinence  and urgency   Discussed converting to sleeping on back to minimize R IR of shoulder as side sleeper      Neuro Re-ed    Neuro Re-ed Details  cued for log roling technique      Manual Therapy   Manual therapy comments STM/MWM at R intercostals                HOME EXERCISE PROGRAM: See pt instruction section    ASSESSMENT:  CLINICAL IMPRESSION  Pt continues to do well without shoe lift past weeks and still showed levelled pelvis alignment. However, anterior rotation of R ilia is likely related to  thoracic rotation with IR of R UE . Pt required manual Tx and excessive   cues for propioception, body  awareness and alignment of RUE to promote scapular retraction and less RUE rotation.   Following Tx, pt noticed she is able to sense shoulder blades connecting to surface of plinth. Provided biopsychosocial techniques to promote compliance and nn system regulation to minmize upper trap overuse and optimial deep core for pelvic floor function. Encouraged continuation of cervicoscapular / thoracolumbar strengthening and further progress LKC propioception training  at next session.  Plan to assess pelvic floor externally next session    Pt benefits from skilled PT.    OBJECTIVE IMPAIRMENTS decreased activity tolerance, decreased coordination, decreased endurance, decreased mobility, difficulty walking, decreased ROM, decreased strength, decreased safety awareness, hypomobility, increased muscle spasms, impaired flexibility, improper body mechanics, postural dysfunction, and pain. scar restrictions   ACTIVITY LIMITATIONS  self-care,  sleep, home chores, work tasks    PARTICIPATION LIMITATIONS:  community, gym activities    PERSONAL FACTORS     are also affecting patient's functional outcome.    REHAB POTENTIAL: Good   CLINICAL DECISION MAKING: Evolving/moderate complexity   EVALUATION COMPLEXITY: Moderate    PATIENT EDUCATION:    Education details: Showed pt anatomy images. Explained muscles attachments/ connection, physiology of deep core system/ spinal- thoracic-pelvis-lower kinetic chain as they relate to pt's presentation, Sx, and past Hx. Explained what and how these areas of deficits need to be restored to balance and function    See Therapeutic activity / neuromuscular re-education section  Answered pt's questions.   Person educated: Patient Education method: Explanation, Demonstration, Tactile cues, Verbal cues, and Handouts Education comprehension: verbalized understanding,  returned demonstration, verbal cues required, tactile cues required, and needs further education     PLAN: PT FREQUENCY: 1x/week   PT DURATION: 10 weeks   PLANNED INTERVENTIONS: Therapeutic exercises, Therapeutic activity, Neuromuscular re-education, Balance training, Gait training, Patient/Family education, Self Care, Joint mobilization, Spinal mobilization, Moist heat, Taping, and Manual therapy, dry needling.   PLAN FOR NEXT SESSION: See clinical impression for plan     GOALS: Goals reviewed with patient? Yes  SHORT TERM GOALS: Target date: 05/26/2023  Pt will demo IND with HEP                    Baseline: Not IND            Goal status: INITIAL   LONG TERM GOALS: Target date: 07/06/2023    1.Pt will demo proper deep core coordination without chest breathing and optimal excursion of diaphragm/pelvic floor in order to promote spinal stability and pelvic floor function   Baseline: dyscoordination Goal status: INITIAL  2.  Pt will demo > 5 pt change on FOTO  to improve QOL and function  PFDI Urinary baseline - 25 Lower score = better function  Urinary Problem baseline- 52  Higher score = better function  Prolapse-4  lower score = better function  Lumber baseline  - 83 Higher score = better function   Goal status: INITIAL  3.  Pt will demo proper body mechanics in against gravity tasks and ADLs  work tasks, fitness  to minimize straining pelvic floor / back                  Baseline: not IND, improper form that places strain on pelvic floor                Goal status: INITIAL   4. Pt will demo decreased separation of diastasis to < 2 fingers width in order to promote stronger intraabdominal pressure system for spinal /  postural stability and pelvic floor function  Baseline: 3 fingers width before head lift, 1 fingers width after  Goal status: INITIAL  5. Pt will demo increased strength for R LE and B hip abd to > 4/5 to promote postural stability and less  leakage Baseline: RLE 4/5, L 5/5 , R hip ab 3/5  Goal status: INITIAL                          6. Pt will report Stool type 4 when she does not take Miralax in order to minimize straining and worsening prolapse.                         Baseline: When she remembers, she takes Miralax 3-4 x week. Without Miralax, pt has stool consistency Type 2-3.   With Miralax, consistency is a Type 4.                           Goal Status: Initial     7. Pt will demo proper upward movement of pelvic floor prior to simulated cough in order to improve SUI                         Baseline: not able to minimize SUI                         Goal Status: Initial                          8. Pt will be IND with scoliosis specific HEP to minimize R shoulder pain with ER/IR and L knee pain  and to maintain levelled alignment of pelvis and spine                         Baseline:  Shoulder pain with ER/IR ,  L knee pain with sitting for 10 min to 1 hour :                        Goal Status: INITIAL    9. Pt will be IND with deep core level 1-2 HEP to minimize DRA, LBP and promote continence Baseline:intermittent LBP :   when lying on her back at night in bed , 4/10 pain,  Pt avoids lots of activities with fear of leaking, with kids activities.  Goal Status: INITIAL   Mariane Masters, PT 06/03/2023, 8:07 AM

## 2023-06-08 ENCOUNTER — Ambulatory Visit: Payer: BC Managed Care – PPO | Attending: Family | Admitting: Physical Therapy

## 2023-06-08 DIAGNOSIS — M21372 Foot drop, left foot: Secondary | ICD-10-CM | POA: Insufficient documentation

## 2023-06-08 DIAGNOSIS — M62838 Other muscle spasm: Secondary | ICD-10-CM | POA: Insufficient documentation

## 2023-06-08 DIAGNOSIS — R278 Other lack of coordination: Secondary | ICD-10-CM | POA: Insufficient documentation

## 2023-06-08 DIAGNOSIS — M533 Sacrococcygeal disorders, not elsewhere classified: Secondary | ICD-10-CM | POA: Diagnosis present

## 2023-06-08 NOTE — Therapy (Signed)
OUTPATIENT PHYSICAL THERAPY TREATMENT   Patient Name: Lisa Brady MRN: [id number] DOB:1986/05/14, 37 y.o., female Today's Date: 06/08/2023   PT End of Session - 06/08/23 1024     Visit Number 6    Number of Visits 10    Date for PT Re-Evaluation 07/06/23    PT Start Time 1015    PT Stop Time 1100    PT Time Calculation (min) 45 min    Activity Tolerance Patient tolerated treatment well;No increased pain    Behavior During Therapy [facility] for tasks assessed/performed             Past Medical History:  Diagnosis Date   Choroid plexus cyst of fetus on prenatal ultrasound 05/29/2021   Dysplastic nevus 04/01/2021   right medial forearm, mod Atypia    Dysplastic nevus 04/01/2021   right inferior axilla, mild atypia    Dysplastic nevus 04/01/2021   right superior medial scapula, mild atypia    Dysplastic nevus 05/05/2022   Right mid med scapula - moderate   Dysplastic nevus 05/05/2022   Right mid to sup buttock - moderate   Dysplastic nevus 05/04/2023   right mid med scapula- recurrent   Foot drop    History of left foot drop 08/17/2019   History of vacuum extraction assisted delivery 04/03/2021   Hypermobility arthralgia    Maternal varicella, non-immune 11/20/2018   Scoliosis    Urinary incontinence    Wears glasses    Past Surgical History:  Procedure Laterality Date   WISDOM TOOTH EXTRACTION  2006   Patient Active Problem List   Diagnosis Date Noted   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   Eyelid dermatitis, allergic/contact 02/11/2023   Hypermobility arthralgia 02/25/2021   Chronic pain of right knee 09/17/2020   Urinary incontinence 08/17/2019    PCP: Chestine Spore   REFERRING PROVIDER: Alfonse Alpers  REFERRING DIAG: Mixed stress and urge urinary incontinence   Rationale for Evaluation and Treatment Rehabilitation  THERAPY DIAG:  Other muscle spasm  Foot drop, left  Sacrococcygeal disorders, not elsewhere classified  Other lack of coordination  ONSET DATE:    SUBJECTIVE:           SUBJECTIVE STATEMENT:                                                     Pt reported she is able to start off sleeping on her back but can feel when she is on her R side, her shoulder is not as turned in  SUBJECTIVE STATEMENT ON EVAL 04/27/23 : Pt had urethral bulking one year ago. It helped with her incontinence which is why she did not keep up with her PT exercises. But the Tx did not help with prolapse.  1) prolapse Sx:  Pt had a long lapse between Pelvic PT.  Pt notices more pressure sensation and more urgency when she is constipation. Denied recurrent UTI. Pt is able to empty urine ok, when she is more constipated , she feels she does have more difficulty emptying completely.   2)  Constipation:  Pt noticed when she is constipated, she notices more prolapse and urgency.  If she is good about taking Miralax, pt has BMs daily and does not strain. When she remembers, she takes Miralax 3-4 x week. Without Miralax, pt has stool consistency Type 2-3.   With Miralax,  consistency is a Type 4.   Daily water intake: 70 oz a day water when working,  60 fl oz of water when she not working, Pt is trying to add more fiber.   Pt had constipation issues on and off for a long time. Pt had 2 children with 2nd degree perineal scars.   3) SUI: pt has leakage with laughing, coughing, sneezing. Pt empties frequently just in case before leaving to activities. Pt avoids lots of activities with fear of leaking, with kids activities.   4) intermittent LBP :   when lying on her back at night in bed , 4/10 pain,central location , nonradiating.   5) L knee pain with sitting for 10 min to 1 hour : located at the back for knee which causes her pain when she stands up after sitting .  Pain 4-5/10 .  Resolves quickly.   6) R shoulder tightness: with ER/ IR   PERTINENT HISTORY:  Pt had 2 children with 2nd degree perineal scars. Hx of Hypermobility . 2nd child weighs 25 lbs and she is needing to  carried by pt. Pt has tried kegel exercise sbut she felt dyscoordinated at her abs.  Pt currently wears a shoe lift in her R shoe.  PAIN:  Are you having pain? Yes:   PRECAUTIONS: None  WEIGHT BEARING RESTRICTIONS: No  FALLS:  Has patient fallen in last 6 months? No  LIVING ENVIRONMENT: Lives with: lives with their family Lives in: House/apartment Stairs: 2 story    OCCUPATION: physician   PLOF: Independent  PATIENT GOALS:  Try to commit to 15 min to lunch break to PT HEP and while kids are napping,  and to keep accountability for Pelvic PT HEP   Pt would like to not pee on herself and avoid prolapse surgery  Not leak with swimming    OBJECTIVE:    [facility] PT Assessment - 06/08/23 1115       Palpation   Spinal mobility L posterior rotation of thorax, R PSIS less anteriorly rotation    SI assessment  R SIJ hypomobility, L paraspinal,              Pelvic Floor Special Questions - 06/08/23 1115     External Perineal Exam through clothing, R anterior pelvic floor tightness, limited lengthening, contraction with no overuse of glut/ adductor mm                [facility] Adult PT Treatment/Exercise - 06/08/23 1155       Therapeutic Activites    Other Therapeutic Activities explained fascial lines from L thorax/ R pelvic floo, discussed use of inflatabl neck pillows for traveling r      Neuro Re-ed    Neuro Re-ed Details  cued for deep core techniqu  , cued for PNF RUE but withheld from HEP e level 1-2, pelvic tilt propioception of pelvic anterior tilt      Manual Therapy   Manual therapy comments STM/MWM at R SIJ / L paraspinals to promtoe anterio rotation of thoraxL               HOME EXERCISE PROGRAM: See pt instruction section    ASSESSMENT:  CLINICAL IMPRESSION  Pt is showing less anterior rotation of R ilia and less R shoulder IR. Discussed and educated pt on fascial line contraction cross  body. Trialed PNF HEP but withheld until deep core  training is in place.   Cued for correct technique for deep core for  more anterior tilt of pelvis and cued for more stabilization of feet.  Discussed modifications to furniture and use of inflatable neck pillow while traveling to continue to sleep on back and less R sidelying .   External pelvic floor assessment showed tighter R pelvic floor mm which decreased post training.   Continue to address more lengthening of pelvic floor and torsion of thorax/ pelvis next session.     Pt benefits from skilled PT.    OBJECTIVE IMPAIRMENTS decreased activity tolerance, decreased coordination, decreased endurance, decreased mobility, difficulty walking, decreased ROM, decreased strength, decreased safety awareness, hypomobility, increased muscle spasms, impaired flexibility, improper body mechanics, postural dysfunction, and pain. scar restrictions   ACTIVITY LIMITATIONS  self-care,  sleep, home chores, work tasks    PARTICIPATION LIMITATIONS:  community, gym activities    PERSONAL FACTORS     are also affecting patient's functional outcome.    REHAB POTENTIAL: Good   CLINICAL DECISION MAKING: Evolving/moderate complexity   EVALUATION COMPLEXITY: Moderate    PATIENT EDUCATION:    Education details: Showed pt anatomy images. Explained muscles attachments/ connection, physiology of deep core system/ spinal- thoracic-pelvis-lower kinetic chain as they relate to pt's presentation, Sx, and past Hx. Explained what and how these areas of deficits need to be restored to balance and function    See Therapeutic activity / neuromuscular re-education section  Answered pt's questions.   Person educated: Patient Education method: Explanation, Demonstration, Tactile cues, Verbal cues, and Handouts Education comprehension: verbalized understanding, returned demonstration, verbal cues required, tactile cues required, and needs further education     PLAN: PT FREQUENCY: 1x/week   PT DURATION: 10 weeks    PLANNED INTERVENTIONS: Therapeutic exercises, Therapeutic activity, Neuromuscular re-education, Balance training, Gait training, Patient/Family education, Self Care, Joint mobilization, Spinal mobilization, Moist heat, Taping, and Manual therapy, dry needling.   PLAN FOR NEXT SESSION: See clinical impression for plan     GOALS: Goals reviewed with patient? Yes  SHORT TERM GOALS: Target date: 05/26/2023  Pt will demo IND with HEP                    Baseline: Not IND            Goal status: INITIAL   LONG TERM GOALS: Target date: 07/06/2023    1.Pt will demo proper deep core coordination without chest breathing and optimal excursion of diaphragm/pelvic floor in order to promote spinal stability and pelvic floor function   Baseline: dyscoordination Goal status: INITIAL  2.  Pt will demo > 5 pt change on FOTO  to improve QOL and function  PFDI Urinary baseline - 25 Lower score = better function  Urinary Problem baseline- 52  Higher score = better function  Prolapse-4  lower score = better function  Lumber baseline  - 83 Higher score = better function   Goal status: INITIAL  3.  Pt will demo proper body mechanics in against gravity tasks and ADLs  work tasks, fitness  to minimize straining pelvic floor / back                  Baseline: not IND, improper form that places strain on pelvic floor                Goal status: INITIAL   4. Pt will demo decreased separation of diastasis to < 2 fingers width in order to promote stronger intraabdominal pressure system for spinal / postural stability and pelvic floor function  Baseline: 3  fingers width before head lift, 1 fingers width after  Goal status: INITIAL  5. Pt will demo increased strength for R LE and B hip abd to > 4/5 to promote postural stability and less leakage Baseline: RLE 4/5, L 5/5 , R hip ab 3/5  Goal status: INITIAL                          6. Pt will report Stool type 4 when she does not take Miralax in  order to minimize straining and worsening prolapse.                         Baseline: When she remembers, she takes Miralax 3-4 x week. Without Miralax, pt has stool consistency Type 2-3.   With Miralax, consistency is a Type 4.                           Goal Status: Initial     7. Pt will demo proper upward movement of pelvic floor prior to simulated cough in order to improve SUI                         Baseline: not able to minimize SUI                         Goal Status: Initial                          8. Pt will be IND with scoliosis specific HEP to minimize R shoulder pain with ER/IR and L knee pain  and to maintain levelled alignment of pelvis and spine                         Baseline:  Shoulder pain with ER/IR ,  L knee pain with sitting for 10 min to 1 hour :                        Goal Status: INITIAL    9. Pt will be IND with deep core level 1-2 HEP to minimize DRA, LBP and promote continence Baseline:intermittent LBP :   when lying on her back at night in bed , 4/10 pain,  Pt avoids lots of activities with fear of leaking, with kids activities.  Goal Status: INITIAL   Mariane Masters, PT 06/08/2023, 11:16 AM

## 2023-06-08 NOTE — Patient Instructions (Signed)
Use inflatable neck pillow for traveling

## 2023-06-17 ENCOUNTER — Ambulatory Visit: Payer: BC Managed Care – PPO | Admitting: Physical Therapy

## 2023-06-22 ENCOUNTER — Encounter: Payer: BC Managed Care – PPO | Admitting: Physical Therapy

## 2023-07-01 ENCOUNTER — Ambulatory Visit: Payer: BC Managed Care – PPO | Admitting: Physical Therapy

## 2023-07-06 ENCOUNTER — Encounter: Payer: BC Managed Care – PPO | Admitting: Physical Therapy

## 2023-07-08 ENCOUNTER — Encounter: Payer: BC Managed Care – PPO | Admitting: Physical Therapy

## 2023-07-14 ENCOUNTER — Encounter: Payer: BC Managed Care – PPO | Admitting: Physical Therapy

## 2023-07-20 ENCOUNTER — Ambulatory Visit: Payer: BC Managed Care – PPO | Attending: Family | Admitting: Physical Therapy

## 2023-07-20 DIAGNOSIS — M21372 Foot drop, left foot: Secondary | ICD-10-CM | POA: Diagnosis present

## 2023-07-20 DIAGNOSIS — M533 Sacrococcygeal disorders, not elsewhere classified: Secondary | ICD-10-CM | POA: Insufficient documentation

## 2023-07-20 DIAGNOSIS — M62838 Other muscle spasm: Secondary | ICD-10-CM | POA: Diagnosis present

## 2023-07-20 DIAGNOSIS — R278 Other lack of coordination: Secondary | ICD-10-CM | POA: Insufficient documentation

## 2023-07-20 NOTE — Patient Instructions (Signed)
  Cricket   On belly, palms under armpits, elbows pointed to ceiling  Inhale: lengthen crown of the head away from shoulders Exhale, feel belly hug in and press palms into the floor, squeezing elbows/shoulder blades towards each other while chest lifts about 5 cm off of the floor. KEEP CHIN TUCKED LIKE YOU ARE HOLDING AN APPLE UNDER CHIN, GAZE AT THE FLOOR OR BED. You should feel the hinging movement at mid back and not the low back.   Locust pose  Pillow under hips if needed for decreased low back pain  Palms face midline by hips  Finger tips shooting straight down  Imagine holding pencil under your armpits Draw shoulders away from ears Inhale Exhale lift chest up slightly without feeling it in your back. The bend happens in the midback  (keep chin tucked)    __  Happy baby with alternating hamstring stretches   ___   Deep core level 1-2 ( find slight anterior tilt of pelvis first, feet firm)   ___  Multidifis with band      Heel raises

## 2023-07-20 NOTE — Therapy (Signed)
OUTPATIENT PHYSICAL THERAPY TREATMENT  / Recert   Patient Name: Lisa Brady MRN: [id number] DOB:[birth year], 37 y.o., female Today's Date: 07/20/2023   PT End of Session - 07/20/23 0850     Visit Number 7    Number of Visits 17    Date for PT Re-Evaluation 09/28/23    PT Start Time 0845    PT Stop Time 0930    PT Time Calculation (min) 45 min    Activity Tolerance Patient tolerated treatment well;No increased pain    Behavior During Therapy [facility] for tasks assessed/performed             Past Medical History:  Diagnosis Date   Choroid plexus cyst of fetus on prenatal ultrasound 05/29/2021   Dysplastic nevus 04/01/2021   right medial forearm, mod Atypia    Dysplastic nevus 04/01/2021   right inferior axilla, mild atypia    Dysplastic nevus 04/01/2021   right superior medial scapula, mild atypia    Dysplastic nevus 05/05/2022   Right mid med scapula - moderate   Dysplastic nevus 05/05/2022   Right mid to sup buttock - moderate   Dysplastic nevus 05/04/2023   right mid med scapula- recurrent   Foot drop    History of left foot drop 08/17/2019   History of vacuum extraction assisted delivery 04/03/2021   Hypermobility arthralgia    Maternal varicella, non-immune 11/20/2018   Scoliosis    Urinary incontinence    Wears glasses    Past Surgical History:  Procedure Laterality Date   WISDOM TOOTH EXTRACTION  2006   Patient Active Problem List   Diagnosis Date Noted   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   Eyelid dermatitis, allergic/contact 02/11/2023   Hypermobility arthralgia 02/25/2021   Chronic pain of right knee 09/17/2020   Urinary incontinence 08/17/2019    PCP: Chestine Spore   REFERRING PROVIDER: Alfonse Alpers  REFERRING DIAG: Mixed stress and urge urinary incontinence   Rationale for Evaluation and Treatment Rehabilitation  THERAPY DIAG:  Other muscle spasm  Sacrococcygeal disorders, not elsewhere classified  Other lack of coordination  ONSET DATE:    SUBJECTIVE:           SUBJECTIVE STATEMENT:                                                     Pt reported noticed some leakage on her trip with family. Pt would like to be able to jump on trampoline with children   SUBJECTIVE STATEMENT ON EVAL 04/27/23 : Pt had urethral bulking one year ago. It helped with her incontinence which is why she did not keep up with her PT exercises. But the Tx did not help with prolapse.  1) prolapse Sx:  Pt had a long lapse between Pelvic PT.  Pt notices more pressure sensation and more urgency when she is constipation. Denied recurrent UTI. Pt is able to empty urine ok, when she is more constipated , she feels she does have more difficulty emptying completely.   2)  Constipation:  Pt noticed when she is constipated, she notices more prolapse and urgency.  If she is good about taking Miralax, pt has BMs daily and does not strain. When she remembers, she takes Miralax 3-4 x week. Without Miralax, pt has stool consistency Type 2-3.   With Miralax, consistency is a Type 4.  Daily water intake: 70 oz a day water when working,  60 fl oz of water when she not working, Pt is trying to add more fiber.   Pt had constipation issues on and off for a long time. Pt had 2 children with 2nd degree perineal scars.   3) SUI: pt has leakage with laughing, coughing, sneezing. Pt empties frequently just in case before leaving to activities. Pt avoids lots of activities with fear of leaking, with kids activities.   4) intermittent LBP :   when lying on her back at night in bed , 4/10 pain,central location , nonradiating.   5) L knee pain with sitting for 10 min to 1 hour : located at the back for knee which causes her pain when she stands up after sitting .  Pain 4-5/10 .  Resolves quickly.   6) R shoulder tightness: with ER/ IR   PERTINENT HISTORY:  Pt had 2 children with 2nd degree perineal scars. Hx of Hypermobility . 2nd child weighs 25 lbs and she is needing to carried by  pt. Pt has tried kegel exercise sbut she felt dyscoordinated at her abs.  Pt currently wears a shoe lift in her R shoe.  PAIN:  Are you having pain? Yes:   PRECAUTIONS: None  WEIGHT BEARING RESTRICTIONS: No  FALLS:  Has patient fallen in last 6 months? No  LIVING ENVIRONMENT: Lives with: lives with their family Lives in: House/apartment Stairs: 2 story    OCCUPATION: physician   PLOF: Independent  PATIENT GOALS:  Try to commit to 15 min to lunch break to PT HEP and while kids are napping,  and to keep accountability for Pelvic PT HEP   Pt would like to not pee on herself and avoid prolapse surgery  Not leak with swimming    OBJECTIVE:     [facility] PT Assessment - 07/20/23 0900       Sit to Stand   Comments --      Posture/Postural Control   Posture Comments no more knee vaglus, rounded shoulders      AROM   Overall AROM Comments limited hamstring mobility      Strength   Overall Strength Comments R hip abd 4-/5, L 4+/5      Palpation   SI assessment  levelled pelvis and shoulder    Palpation comment hypombility at T10, medial scapula, intercostals , much les hypomobility at cervical spine             Pelvic Floor Special Questions - 07/20/23 0926     Diastasis Recti no  separation    External Perineal Exam through clothing, R posterior pelvic floor tightness. hamstrings    Pelvic Floor external assessment:    tightness of R > L          [facility] Adult PT Treatment/Exercise - 07/20/23 0926       Therapeutic Activites    Other Therapeutic Activities reasssedd goals for recert , explained the role of PF HEP and improving foot arches contribute to less overactivity of pelvic floor and promote more pelvic stability      Neuro Re-ed    Neuro Re-ed Details  cued for new pelvic floor/ semimembranosus / tendinosus stretches , cued for squats with more WBing across ballmounds      Manual Therapy   Manual therapy comments STM/MWM at T10, R medial scapula,  R  pelvic floor posterior  HOME EXERCISE PROGRAM: See pt instruction section    ASSESSMENT:  CLINICAL IMPRESSION Pt met 5/9 goals and progressing well towards remaining goals.   Improvements include: Pt had no LBP when lying on her back at night for the post month  No longer with uneven shoulder/ pelvis alignment, no longer needing shoe lift to maintain levelled pelvis alignment Less rounded R shoulder and more stability in RUE and thoracic but still need thoracic manual Tx to realign T10 segment and area of mm tightness  No longer shows adducted knees but will still need training and strengthening of feet arches Squat alignment and technique improved but will still need progression towards functional movements with moe pelvic stability  Improved deep core strength with resolved diastasis recti  Increased hip abduction strength Gradually decreasing pelvic floor, needing more manual Tx to minimize tightness on R pelvic floor mm at upcoming sessions     Pt will continue to benefit form skilled PT to gain more  mobility, awareness, propioception of pelvic floor for continence and more LKC training to integrate to fitness movements and play with children.  Pt benefits from skilled PT.    OBJECTIVE IMPAIRMENTS decreased activity tolerance, decreased coordination, decreased endurance, decreased mobility, difficulty walking, decreased ROM, decreased strength, decreased safety awareness, hypomobility, increased muscle spasms, impaired flexibility, improper body mechanics, postural dysfunction, and pain. scar restrictions   ACTIVITY LIMITATIONS  self-care,  sleep, home chores, work tasks    PARTICIPATION LIMITATIONS:  community, gym activities    PERSONAL FACTORS     are also affecting patient's functional outcome.    REHAB POTENTIAL: Good   CLINICAL DECISION MAKING: Evolving/moderate complexity   EVALUATION COMPLEXITY: Moderate    PATIENT EDUCATION:     Education details: Showed pt anatomy images. Explained muscles attachments/ connection, physiology of deep core system/ spinal- thoracic-pelvis-lower kinetic chain as they relate to pt's presentation, Sx, and past Hx. Explained what and how these areas of deficits need to be restored to balance and function    See Therapeutic activity / neuromuscular re-education section  Answered pt's questions.   Person educated: Patient Education method: Explanation, Demonstration, Tactile cues, Verbal cues, and Handouts Education comprehension: verbalized understanding, returned demonstration, verbal cues required, tactile cues required, and needs further education     PLAN: PT FREQUENCY: 1x/week   PT DURATION: 10 weeks   PLANNED INTERVENTIONS: Therapeutic exercises, Therapeutic activity, Neuromuscular re-education, Balance training, Gait training, Patient/Family education, Self Care, Joint mobilization, Spinal mobilization, Moist heat, Taping, and Manual therapy, dry needling.   PLAN FOR NEXT SESSION: See clinical impression for plan     GOALS: Goals reviewed with patient? Yes  SHORT TERM GOALS: Target date: 05/26/2023  Pt will demo IND with HEP                    Baseline: Not IND            Goal status: MET    LONG TERM GOALS: Target date: 09/28/2023    1.Pt will demo proper deep core coordination without chest breathing and optimal excursion of diaphragm/pelvic floor in order to promote spinal stability and pelvic floor function   Baseline: dyscoordination Goal status: MET   2.  Pt will demo > 5 pt change on FOTO  to improve QOL and function  PFDI Urinary baseline - 25  -> 21  Lower score = better function  Urinary Problem baseline- 52  --> 56  Higher score = better function  Prolapse-4  -> 17 (  still need to address 07/20/23 )  lower score = better function  Lumber baseline  - 83  -> 94    MET 07/20/23  Higher score = better function   Goal status: ongoing   3.  Pt  will demo proper body mechanics in against gravity tasks and ADLs  work tasks, fitness  to minimize straining pelvic floor / back                  Baseline: not IND, improper form that places strain on pelvic floor                Goal status:  Ongoing    4. Pt will demo decreased separation of diastasis to < 2 fingers width in order to promote stronger intraabdominal pressure system for spinal / postural stability and pelvic floor function  Baseline: 3 fingers width before head lift, 1 fingers width after  Goal status:  MET 07/20/23  ( no separation)   5. Pt will demo increased strength for R LE and B hip abd to > 4/5 to promote postural stability and less leakage Baseline: RLE 4/5, L 5/5 , R hip ab 3/5  Goal status:  MET ( 07/20/23 )                           6. Pt will report Stool type 4 when she does not take Miralax in order to minimize straining and worsening prolapse.                         Baseline: When she remembers, she takes Miralax 3-4 x week. Without Miralax, pt has stool consistency Type 2-3.   With Miralax, consistency is a Type 4.                           Goal Status: Ongoing     7. Pt will demo proper upward movement of pelvic floor prior to simulated cough in order to improve SUI                         Baseline: not able to minimize SUI                         Goal Status:  Ongoing                          8. Pt will be IND with scoliosis specific HEP to minimize R shoulder pain with ER/IR and L knee pain  and to maintain levelled alignment of pelvis and spine                         Baseline:  Shoulder pain with ER/IR ,  L knee pain with sitting for 10 min to 1 hour :                        Goal Status:  MET     9. Pt will be IND with deep core level 1-2 HEP to minimize DRA, LBP and promote continence Baseline:intermittent LBP :   when lying on her back at night in bed , 4/10 pain,  Pt avoids lots of activities with fear of leaking, with kids activities.  Goal Status:  MET  Pt had no LBP when lying on her back at night for the post month   10. Pt will feel comfortable engaging in activities with kids with less fear of leakage , jumping on trampoline, dancing, without needing to go the bathroom  Baseline: avoids jumping on trampoline Goal Status: NEW   Mariane Masters, PT 07/20/2023, 8:51 AM

## 2023-07-21 ENCOUNTER — Encounter: Payer: BC Managed Care – PPO | Admitting: Physical Therapy

## 2023-07-28 ENCOUNTER — Encounter: Payer: BC Managed Care – PPO | Admitting: Physical Therapy

## 2023-07-29 ENCOUNTER — Ambulatory Visit: Payer: BC Managed Care – PPO | Admitting: Physical Therapy

## 2023-07-29 DIAGNOSIS — R278 Other lack of coordination: Secondary | ICD-10-CM

## 2023-07-29 DIAGNOSIS — M62838 Other muscle spasm: Secondary | ICD-10-CM

## 2023-07-29 DIAGNOSIS — M21372 Foot drop, left foot: Secondary | ICD-10-CM

## 2023-07-29 DIAGNOSIS — M533 Sacrococcygeal disorders, not elsewhere classified: Secondary | ICD-10-CM

## 2023-07-29 NOTE — Therapy (Signed)
OUTPATIENT PHYSICAL THERAPY TREATMENT    Patient Name: Sanijah Estelle MRN: [id number] DOB:June 20, 1986, 37 y.o., female Today's Date: 07/29/2023   PT End of Session - 07/29/23 0850     Visit Number 8    Number of Visits 17    Date for PT Re-Evaluation 09/28/23    PT Start Time 0804    PT Stop Time 0850    PT Time Calculation (min) 46 min    Activity Tolerance Patient tolerated treatment well;No increased pain    Behavior During Therapy [facility] for tasks assessed/performed             Past Medical History:  Diagnosis Date   Choroid plexus cyst of fetus on prenatal ultrasound 05/29/2021   Dysplastic nevus 04/01/2021   right medial forearm, mod Atypia    Dysplastic nevus 04/01/2021   right inferior axilla, mild atypia    Dysplastic nevus 04/01/2021   right superior medial scapula, mild atypia    Dysplastic nevus 05/05/2022   Right mid med scapula - moderate   Dysplastic nevus 05/05/2022   Right mid to sup buttock - moderate   Dysplastic nevus 05/04/2023   right mid med scapula- recurrent   Foot drop    History of left foot drop 08/17/2019   History of vacuum extraction assisted delivery 04/03/2021   Hypermobility arthralgia    Maternal varicella, non-immune 11/20/2018   Scoliosis    Urinary incontinence    Wears glasses    Past Surgical History:  Procedure Laterality Date   WISDOM TOOTH EXTRACTION  2006   Patient Active Problem List   Diagnosis Date Noted   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   Eyelid dermatitis, allergic/contact 02/11/2023   Hypermobility arthralgia 02/25/2021   Chronic pain of right knee 09/17/2020   Urinary incontinence 08/17/2019    PCP: Chestine Spore   REFERRING PROVIDER: Alfonse Alpers  REFERRING DIAG: Mixed stress and urge urinary incontinence   Rationale for Evaluation and Treatment Rehabilitation  THERAPY DIAG:  Other muscle spasm  Sacrococcygeal disorders, not elsewhere classified  Other lack of coordination  ONSET DATE:   SUBJECTIVE:            SUBJECTIVE STATEMENT:                                                     Pt reported was not able to do her HEP this past week.  Pt notices pinching pain at R anterior hip this past week   SUBJECTIVE STATEMENT ON EVAL 04/27/23 : Pt had urethral bulking one year ago. It helped with her incontinence which is why she did not keep up with her PT exercises. But the Tx did not help with prolapse.  1) prolapse Sx:  Pt had a long lapse between Pelvic PT.  Pt notices more pressure sensation and more urgency when she is constipation. Denied recurrent UTI. Pt is able to empty urine ok, when she is more constipated , she feels she does have more difficulty emptying completely.   2)  Constipation:  Pt noticed when she is constipated, she notices more prolapse and urgency.  If she is good about taking Miralax, pt has BMs daily and does not strain. When she remembers, she takes Miralax 3-4 x week. Without Miralax, pt has stool consistency Type 2-3.   With Miralax, consistency is a Type 4.  Daily water intake: 70 oz a day water when working,  60 fl oz of water when she not working, Pt is trying to add more fiber.   Pt had constipation issues on and off for a long time. Pt had 2 children with 2nd degree perineal scars.   3) SUI: pt has leakage with laughing, coughing, sneezing. Pt empties frequently just in case before leaving to activities. Pt avoids lots of activities with fear of leaking, with kids activities.   4) intermittent LBP :   when lying on her back at night in bed , 4/10 pain,central location , nonradiating.   5) L knee pain with sitting for 10 min to 1 hour : located at the back for knee which causes her pain when she stands up after sitting .  Pain 4-5/10 .  Resolves quickly.   6) R shoulder tightness: with ER/ IR   PERTINENT HISTORY:  Pt had 2 children with 2nd degree perineal scars. Hx of Hypermobility . 2nd child weighs 25 lbs and she is needing to carried by pt. Pt has tried kegel  exercise sbut she felt dyscoordinated at her abs.  Pt currently wears a shoe lift in her R shoe.  PAIN:  Are you having pain? Yes:   PRECAUTIONS: None  WEIGHT BEARING RESTRICTIONS: No  FALLS:  Has patient fallen in last 6 months? No  LIVING ENVIRONMENT: Lives with: lives with their family Lives in: House/apartment Stairs: 2 story    OCCUPATION: physician   PLOF: Independent  PATIENT GOALS:  Try to commit to 15 min to lunch break to PT HEP and while kids are napping,  and to keep accountability for Pelvic PT HEP   Pt would like to not pee on herself and avoid prolapse surgery  Not leak with swimming    OBJECTIVE:    [facility] PT Assessment - 07/29/23 1035       Palpation   SI assessment  levelled pelvis and shoulder    Palpation comment L SIJ , midfoot joints, plantar fascia , tib fib hypomobile               [facility] Adult PT Treatment/Exercise - 07/29/23 1610       Neuro Re-ed    Neuro Re-ed Details  cued for     Higher knees , ballmounds land, navel more forward in gait  , cued for [facility] for DF/EV, ER knee      Manual Therapy   Manual therapy comments distraction              HOME EXERCISE PROGRAM: See pt instruction section    ASSESSMENT:  CLINICAL IMPRESSION Addressed pt's c/o R pinching anterior hip pain today which is related to hypomobile L SIJ, adducted knees, medial collapse of feet, limited co-activation of transverse arches. Manual Tx was modified to minimzie tenderness when pt reported pain and tenderness. K Tape was provided today to promote propioception for DF/EV, toe abduction,m lifted arches. Cued for gait training for higher knees, more anterior COM over trasverse arches.  Cued for anterior tilt of pelvis and feet propioception with deep core HEP.  Discussed co-created strategies to maintain HEP during changes to her work and home schedule.   Pt will continue to benefit form skilled PT to gain more  mobility, awareness, propioception of  pelvic floor for continence and more [facility] training to integrate to fitness movements and play with children.  Pt benefits from skilled PT.    OBJECTIVE IMPAIRMENTS decreased activity  tolerance, decreased coordination, decreased endurance, decreased mobility, difficulty walking, decreased ROM, decreased strength, decreased safety awareness, hypomobility, increased muscle spasms, impaired flexibility, improper body mechanics, postural dysfunction, and pain. scar restrictions   ACTIVITY LIMITATIONS  self-care,  sleep, home chores, work tasks    PARTICIPATION LIMITATIONS:  community, gym activities    PERSONAL FACTORS     are also affecting patient's functional outcome.    REHAB POTENTIAL: Good   CLINICAL DECISION MAKING: Evolving/moderate complexity   EVALUATION COMPLEXITY: Moderate    PATIENT EDUCATION:    Education details: Showed pt anatomy images. Explained muscles attachments/ connection, physiology of deep core system/ spinal- thoracic-pelvis-lower kinetic chain as they relate to pt's presentation, Sx, and past Hx. Explained what and how these areas of deficits need to be restored to balance and function    See Therapeutic activity / neuromuscular re-education section  Answered pt's questions.   Person educated: Patient Education method: Explanation, Demonstration, Tactile cues, Verbal cues, and Handouts Education comprehension: verbalized understanding, returned demonstration, verbal cues required, tactile cues required, and needs further education     PLAN: PT FREQUENCY: 1x/week   PT DURATION: 10 weeks   PLANNED INTERVENTIONS: Therapeutic exercises, Therapeutic activity, Neuromuscular re-education, Balance training, Gait training, Patient/Family education, Self Care, Joint mobilization, Spinal mobilization, Moist heat, Taping, and Manual therapy, dry needling.   PLAN FOR NEXT SESSION: See clinical impression for plan     GOALS: Goals reviewed with patient? Yes  SHORT  TERM GOALS: Target date: 05/26/2023  Pt will demo IND with HEP                    Baseline: Not IND            Goal status: MET    LONG TERM GOALS: Target date: 09/28/2023    1.Pt will demo proper deep core coordination without chest breathing and optimal excursion of diaphragm/pelvic floor in order to promote spinal stability and pelvic floor function   Baseline: dyscoordination Goal status: MET   2.  Pt will demo > 5 pt change on FOTO  to improve QOL and function  PFDI Urinary baseline - 25  -> 21  Lower score = better function  Urinary Problem baseline- 52  --> 56  Higher score = better function  Prolapse-4  -> 17 ( still need to address 07/20/23 )  lower score = better function  Lumber baseline  - 83  -> 94    MET 07/20/23  Higher score = better function   Goal status: ongoing   3.  Pt will demo proper body mechanics in against gravity tasks and ADLs  work tasks, fitness  to minimize straining pelvic floor / back                  Baseline: not IND, improper form that places strain on pelvic floor                Goal status:  Ongoing    4. Pt will demo decreased separation of diastasis to < 2 fingers width in order to promote stronger intraabdominal pressure system for spinal / postural stability and pelvic floor function  Baseline: 3 fingers width before head lift, 1 fingers width after  Goal status:  MET 07/20/23  ( no separation)   5. Pt will demo increased strength for R LE and B hip abd to > 4/5 to promote postural stability and less leakage Baseline: RLE 4/5, L 5/5 , R hip ab 3/5  Goal status:  MET ( 07/20/23 )                           6. Pt will report Stool type 4 when she does not take Miralax in order to minimize straining and worsening prolapse.                         Baseline: When she remembers, she takes Miralax 3-4 x week. Without Miralax, pt has stool consistency Type 2-3.   With Miralax, consistency is a Type 4.                           Goal Status:  Ongoing     7. Pt will demo proper upward movement of pelvic floor prior to simulated cough in order to improve SUI                         Baseline: not able to minimize SUI                         Goal Status:  Ongoing                          8. Pt will be IND with scoliosis specific HEP to minimize R shoulder pain with ER/IR and L knee pain  and to maintain levelled alignment of pelvis and spine                         Baseline:  Shoulder pain with ER/IR ,  L knee pain with sitting for 10 min to 1 hour :                        Goal Status:  MET     9. Pt will be IND with deep core level 1-2 HEP to minimize DRA, LBP and promote continence Baseline:intermittent LBP :   when lying on her back at night in bed , 4/10 pain,  Pt avoids lots of activities with fear of leaking, with kids activities.  Goal Status:  MET  Pt had no LBP when lying on her back at night for the post month   10. Pt will feel comfortable engaging in activities with kids with less fear of leakage , jumping on trampoline, dancing, without needing to go the bathroom  Baseline: avoids jumping on trampoline Goal Status: NEW   Mariane Masters, PT 07/29/2023, 10:36 AM

## 2023-07-29 NOTE — Patient Instructions (Addendum)
Strengthening feet arches:    Heel raises - heels together, minisquat  Minisquat motion, trunk bent , gaze onto floor like you are looking at your reflection over a lake/pond,  Knees bent pointed out like a "v" , navel ( center of mass) more forward  Heels together as you lift, pointed out like a "v"  KNEES ARE ALIGNED BEHIND THE TOES TO MINIMIZE STRAIN ON THE KNEES your  navel ( center of mass) more forward to a avoid dropping down fast and rocking more weight back onto heels , keep heels pressing against each other the whole time   20 reps  x 3  ________________   Higher knees , ballmounds land, navel more forward in gait

## 2023-08-03 ENCOUNTER — Encounter: Payer: BC Managed Care – PPO | Admitting: Physical Therapy

## 2023-08-04 ENCOUNTER — Ambulatory Visit: Payer: BC Managed Care – PPO | Admitting: Physical Therapy

## 2023-08-04 ENCOUNTER — Encounter: Payer: BC Managed Care – PPO | Admitting: Physical Therapy

## 2023-08-04 DIAGNOSIS — M533 Sacrococcygeal disorders, not elsewhere classified: Secondary | ICD-10-CM

## 2023-08-04 DIAGNOSIS — M62838 Other muscle spasm: Secondary | ICD-10-CM | POA: Diagnosis not present

## 2023-08-04 DIAGNOSIS — R278 Other lack of coordination: Secondary | ICD-10-CM

## 2023-08-04 NOTE — Therapy (Signed)
OUTPATIENT PHYSICAL THERAPY TREATMENT    Patient Name: Lisa Brady MRN: [id number] DOB:Aug 03, 1986, 37 y.o., female Today's Date: 08/04/2023   PT End of Session - 08/04/23 1423     Visit Number 9    Number of Visits 17    Date for PT Re-Evaluation 09/28/23    PT Start Time 1420    PT Stop Time 1500    PT Time Calculation (min) 40 min    Activity Tolerance Patient tolerated treatment well;No increased pain    Behavior During Therapy [facility] for tasks assessed/performed             Past Medical History:  Diagnosis Date   Choroid plexus cyst of fetus on prenatal ultrasound 05/29/2021   Dysplastic nevus 04/01/2021   right medial forearm, mod Atypia    Dysplastic nevus 04/01/2021   right inferior axilla, mild atypia    Dysplastic nevus 04/01/2021   right superior medial scapula, mild atypia    Dysplastic nevus 05/05/2022   Right mid med scapula - moderate   Dysplastic nevus 05/05/2022   Right mid to sup buttock - moderate   Dysplastic nevus 05/04/2023   right mid med scapula- recurrent   Foot drop    History of left foot drop 08/17/2019   History of vacuum extraction assisted delivery 04/03/2021   Hypermobility arthralgia    Maternal varicella, non-immune 11/20/2018   Scoliosis    Urinary incontinence    Wears glasses    Past Surgical History:  Procedure Laterality Date   WISDOM TOOTH EXTRACTION  2006   Patient Active Problem List   Diagnosis Date Noted   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   Eyelid dermatitis, allergic/contact 02/11/2023   Hypermobility arthralgia 02/25/2021   Chronic pain of right knee 09/17/2020   Urinary incontinence 08/17/2019    PCP: Chestine Spore   REFERRING PROVIDER: Alfonse Alpers  REFERRING DIAG: Mixed stress and urge urinary incontinence   Rationale for Evaluation and Treatment Rehabilitation  THERAPY DIAG:  Other muscle spasm  Sacrococcygeal disorders, not elsewhere classified  Other lack of coordination  ONSET DATE:   SUBJECTIVE:            SUBJECTIVE STATEMENT:                                                     Pt reported was not able to do her HEP this past week.  Pt notices pinching pain at R anterior hip this past week   SUBJECTIVE STATEMENT ON EVAL 04/27/23 : Pt had urethral bulking one year ago. It helped with her incontinence which is why she did not keep up with her PT exercises. But the Tx did not help with prolapse.  1) prolapse Sx:  Pt had a long lapse between Pelvic PT.  Pt notices more pressure sensation and more urgency when she is constipation. Denied recurrent UTI. Pt is able to empty urine ok, when she is more constipated , she feels she does have more difficulty emptying completely.   2)  Constipation:  Pt noticed when she is constipated, she notices more prolapse and urgency.  If she is good about taking Miralax, pt has BMs daily and does not strain. When she remembers, she takes Miralax 3-4 x week. Without Miralax, pt has stool consistency Type 2-3.   With Miralax, consistency is a Type 4.  Daily water intake: 70 oz a day water when working,  60 fl oz of water when she not working, Pt is trying to add more fiber.   Pt had constipation issues on and off for a long time. Pt had 2 children with 2nd degree perineal scars.   3) SUI: pt has leakage with laughing, coughing, sneezing. Pt empties frequently just in case before leaving to activities. Pt avoids lots of activities with fear of leaking, with kids activities.   4) intermittent LBP :   when lying on her back at night in bed , 4/10 pain,central location , nonradiating.   5) L knee pain with sitting for 10 min to 1 hour : located at the back for knee which causes her pain when she stands up after sitting .  Pain 4-5/10 .  Resolves quickly.   6) R shoulder tightness: with ER/ IR   PERTINENT HISTORY:  Pt had 2 children with 2nd degree perineal scars. Hx of Hypermobility . 2nd child weighs 25 lbs and she is needing to carried by pt. Pt has tried kegel  exercise sbut she felt dyscoordinated at her abs.  Pt currently wears a shoe lift in her R shoe.  PAIN:  Are you having pain? Yes:   PRECAUTIONS: None  WEIGHT BEARING RESTRICTIONS: No  FALLS:  Has patient fallen in last 6 months? No  LIVING ENVIRONMENT: Lives with: lives with their family Lives in: House/apartment Stairs: 2 story    OCCUPATION: physician   PLOF: Independent  PATIENT GOALS:  Try to commit to 15 min to lunch break to PT HEP and while kids are napping,  and to keep accountability for Pelvic PT HEP   Pt would like to not pee on herself and avoid prolapse surgery  Not leak with swimming    OBJECTIVE:    Pelvic Floor Special Questions - 08/04/23 1610     Pelvic Floor Internal Exam pt consented verbally without contraindications    Exam Type Vaginal    Palpation tightness and tenderness at 5-7  and 11 and 1 o'clock ,  lowered urethra , posterior tilt of pelvis and poor feet proioception  ,    Strength weak squeeze, no lift    Biofeedback tactile cues for circumferential and sequential contraction                 [facility] Adult PT Treatment/Exercise - 08/04/23 1627       Neuro Re-ed    Neuro Re-ed Details  cued for feet propicoption, anterior tilt of pelvis , circumferential/ sequential contraction      Manual Therapy   Internal Pelvic Floor STM/MWM at 5-7 o'clock at perineal scars, facilitation of urethra upward position, fascial release at low abd              HOME EXERCISE PROGRAM: See pt instruction section    ASSESSMENT:  CLINICAL IMPRESSION Addressed pt's pelvic floor with internal pelvic floor assessment today. Pt showed tightness along anterior and posterior pelvic floor with lowered position of urethra and poor contraction activation of all 3 layers of mm.  Following Tx and excessive cues for less posterior tilt of pelvis and relaxation of low abs, pt showed less tenderness and tightness of pelvic floor mm and elicited more upward  movement of mm with more upward position of urethra. Pt also required cues for feet co-activation. Plan to continue to address pelvic floor tightness and coordination training to advance to kegel contractions at next session.  Pt  will continue to benefit form skilled PT to gain more  mobility, awareness, propioception of pelvic floor for continence and more [facility] training to integrate to fitness movements and play with children.  Pt benefits from skilled PT.    OBJECTIVE IMPAIRMENTS decreased activity tolerance, decreased coordination, decreased endurance, decreased mobility, difficulty walking, decreased ROM, decreased strength, decreased safety awareness, hypomobility, increased muscle spasms, impaired flexibility, improper body mechanics, postural dysfunction, and pain. scar restrictions   ACTIVITY LIMITATIONS  self-care,  sleep, home chores, work tasks    PARTICIPATION LIMITATIONS:  community, gym activities    PERSONAL FACTORS     are also affecting patient's functional outcome.    REHAB POTENTIAL: Good   CLINICAL DECISION MAKING: Evolving/moderate complexity   EVALUATION COMPLEXITY: Moderate    PATIENT EDUCATION:    Education details: Showed pt anatomy images. Explained muscles attachments/ connection, physiology of deep core system/ spinal- thoracic-pelvis-lower kinetic chain as they relate to pt's presentation, Sx, and past Hx. Explained what and how these areas of deficits need to be restored to balance and function    See Therapeutic activity / neuromuscular re-education section  Answered pt's questions.   Person educated: Patient Education method: Explanation, Demonstration, Tactile cues, Verbal cues, and Handouts Education comprehension: verbalized understanding, returned demonstration, verbal cues required, tactile cues required, and needs further education     PLAN: PT FREQUENCY: 1x/week   PT DURATION: 10 weeks   PLANNED INTERVENTIONS: Therapeutic exercises,  Therapeutic activity, Neuromuscular re-education, Balance training, Gait training, Patient/Family education, Self Care, Joint mobilization, Spinal mobilization, Moist heat, Taping, and Manual therapy, dry needling.   PLAN FOR NEXT SESSION: See clinical impression for plan     GOALS: Goals reviewed with patient? Yes  SHORT TERM GOALS: Target date: 05/26/2023  Pt will demo IND with HEP                    Baseline: Not IND            Goal status: MET    LONG TERM GOALS: Target date: 09/28/2023    1.Pt will demo proper deep core coordination without chest breathing and optimal excursion of diaphragm/pelvic floor in order to promote spinal stability and pelvic floor function   Baseline: dyscoordination Goal status: MET   2.  Pt will demo > 5 pt change on FOTO  to improve QOL and function  PFDI Urinary baseline - 25  -> 21  Lower score = better function  Urinary Problem baseline- 52  --> 56  Higher score = better function  Prolapse-4  -> 17 ( still need to address 07/20/23 )  lower score = better function  Lumber baseline  - 83  -> 94    MET 07/20/23  Higher score = better function   Goal status: ongoing   3.  Pt will demo proper body mechanics in against gravity tasks and ADLs  work tasks, fitness  to minimize straining pelvic floor / back                  Baseline: not IND, improper form that places strain on pelvic floor                Goal status:  Ongoing    4. Pt will demo decreased separation of diastasis to < 2 fingers width in order to promote stronger intraabdominal pressure system for spinal / postural stability and pelvic floor function  Baseline: 3 fingers width before head lift, 1 fingers width after  Goal status:  MET 07/20/23  ( no separation)   5. Pt will demo increased strength for R LE and B hip abd to > 4/5 to promote postural stability and less leakage Baseline: RLE 4/5, L 5/5 , R hip ab 3/5  Goal status:  MET ( 07/20/23 )                           6.  Pt will report Stool type 4 when she does not take Miralax in order to minimize straining and worsening prolapse.                         Baseline: When she remembers, she takes Miralax 3-4 x week. Without Miralax, pt has stool consistency Type 2-3.   With Miralax, consistency is a Type 4.                           Goal Status: Ongoing     7. Pt will demo proper upward movement of pelvic floor prior to simulated cough in order to improve SUI                         Baseline: not able to minimize SUI                         Goal Status:  Ongoing                          8. Pt will be IND with scoliosis specific HEP to minimize R shoulder pain with ER/IR and L knee pain  and to maintain levelled alignment of pelvis and spine                         Baseline:  Shoulder pain with ER/IR ,  L knee pain with sitting for 10 min to 1 hour :                        Goal Status:  MET     9. Pt will be IND with deep core level 1-2 HEP to minimize DRA, LBP and promote continence Baseline:intermittent LBP :   when lying on her back at night in bed , 4/10 pain,  Pt avoids lots of activities with fear of leaking, with kids activities.  Goal Status:  MET  Pt had no LBP when lying on her back at night for the post month   10. Pt will feel comfortable engaging in activities with kids with less fear of leakage , jumping on trampoline, dancing, without needing to go the bathroom  Baseline: avoids jumping on trampoline Goal Status: NEW   Mariane Masters, PT 08/04/2023, 4:30 PM

## 2023-08-12 ENCOUNTER — Ambulatory Visit: Payer: BC Managed Care – PPO | Attending: Family | Admitting: Physical Therapy

## 2023-08-12 DIAGNOSIS — M533 Sacrococcygeal disorders, not elsewhere classified: Secondary | ICD-10-CM | POA: Insufficient documentation

## 2023-08-12 DIAGNOSIS — M21372 Foot drop, left foot: Secondary | ICD-10-CM | POA: Insufficient documentation

## 2023-08-12 DIAGNOSIS — M62838 Other muscle spasm: Secondary | ICD-10-CM | POA: Diagnosis present

## 2023-08-12 DIAGNOSIS — R278 Other lack of coordination: Secondary | ICD-10-CM | POA: Diagnosis present

## 2023-08-12 NOTE — Patient Instructions (Signed)
Abdominal massage upward from L,  R , center to belly button 3 stroke x 3 , pressure is gentle and light with all fingers flat, not using finger tips   

## 2023-08-12 NOTE — Therapy (Signed)
OUTPATIENT PHYSICAL THERAPY TREATMENT  /PROGRESS NOTE across 10 visits from 04/27/23 to 08/12/23   Patient Name: Lisa Brady MRN: [id number] DOB:Dec 25, 1985, 37 y.o., female Today's Date: 08/12/2023   PT End of Session - 08/12/23 0942     Visit Number 10    Number of Visits 17    Date for PT Re-Evaluation 09/28/23 , PN 9/6    PT Start Time 0935    PT Stop Time 1015    PT Time Calculation (min) 40 min    Activity Tolerance Patient tolerated treatment well;No increased pain    Behavior During Therapy [facility] for tasks assessed/performed             Past Medical History:  Diagnosis Date   Choroid plexus cyst of fetus on prenatal ultrasound 05/29/2021   Dysplastic nevus 04/01/2021   right medial forearm, mod Atypia    Dysplastic nevus 04/01/2021   right inferior axilla, mild atypia    Dysplastic nevus 04/01/2021   right superior medial scapula, mild atypia    Dysplastic nevus 05/05/2022   Right mid med scapula - moderate   Dysplastic nevus 05/05/2022   Right mid to sup buttock - moderate   Dysplastic nevus 05/04/2023   right mid med scapula- recurrent   Foot drop    History of left foot drop 08/17/2019   History of vacuum extraction assisted delivery 04/03/2021   Hypermobility arthralgia    Maternal varicella, non-immune 11/20/2018   Scoliosis    Urinary incontinence    Wears glasses    Past Surgical History:  Procedure Laterality Date   WISDOM TOOTH EXTRACTION  2006   Patient Active Problem List   Diagnosis Date Noted   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   Eyelid dermatitis, allergic/contact 02/11/2023   Hypermobility arthralgia 02/25/2021   Chronic pain of right knee 09/17/2020   Urinary incontinence 08/17/2019    PCP: Chestine Spore   REFERRING PROVIDER: Alfonse Alpers  REFERRING DIAG: Mixed stress and urge urinary incontinence   Rationale for Evaluation and Treatment Rehabilitation  THERAPY DIAG:  Other muscle spasm  Sacrococcygeal disorders, not elsewhere  classified  Other lack of coordination  ONSET DATE:   SUBJECTIVE:           SUBJECTIVE STATEMENT:                                                     Pt reported she noticed the lifting of pelvic floor with deep core level 1-2, she did not feel it at 4 min of the 6 min.  Pt noticed her knee does not hurt with awareness of proper stepping.   SUBJECTIVE STATEMENT ON EVAL 04/27/23 : Pt had urethral bulking one year ago. It helped with her incontinence which is why she did not keep up with her PT exercises. But the Tx did not help with prolapse.  1) prolapse Sx:  Pt had a long lapse between Pelvic PT.  Pt notices more pressure sensation and more urgency when she is constipation. Denied recurrent UTI. Pt is able to empty urine ok, when she is more constipated , she feels she does have more difficulty emptying completely.   2)  Constipation:  Pt noticed when she is constipated, she notices more prolapse and urgency.  If she is good about taking Miralax, pt has BMs daily and does not strain. When  she remembers, she takes Miralax 3-4 x week. Without Miralax, pt has stool consistency Type 2-3.   With Miralax, consistency is a Type 4.   Daily water intake: 70 oz a day water when working,  60 fl oz of water when she not working, Pt is trying to add more fiber.   Pt had constipation issues on and off for a long time. Pt had 2 children with 2nd degree perineal scars.   3) SUI: pt has leakage with laughing, coughing, sneezing. Pt empties frequently just in case before leaving to activities. Pt avoids lots of activities with fear of leaking, with kids activities.   4) intermittent LBP :   when lying on her back at night in bed , 4/10 pain,central location , nonradiating.   5) L knee pain with sitting for 10 min to 1 hour : located at the back for knee which causes her pain when she stands up after sitting .  Pain 4-5/10 .  Resolves quickly.   6) R shoulder tightness: with ER/ IR   PERTINENT HISTORY:   Pt had 2 children with 2nd degree perineal scars. Hx of Hypermobility . 2nd child weighs 25 lbs and she is needing to carried by pt. Pt has tried kegel exercise sbut she felt dyscoordinated at her abs.  Pt currently wears a shoe lift in her R shoe.  PAIN:  Are you having pain? Yes:   PRECAUTIONS: None  WEIGHT BEARING RESTRICTIONS: No  FALLS:  Has patient fallen in last 6 months? No  LIVING ENVIRONMENT: Lives with: lives with their family Lives in: House/apartment Stairs: 2 story    OCCUPATION: physician   PLOF: Independent  PATIENT GOALS:  Try to commit to 15 min to lunch break to PT HEP and while kids are napping,  and to keep accountability for Pelvic PT HEP   Pt would like to not pee on herself and avoid prolapse surgery  Not leak with swimming    OBJECTIVE:     Pelvic Floor Special Questions - 08/12/23 1148     Pelvic Floor Internal Exam pt consented verbally without contraindications    Exam Type Vaginal    Palpation tightness and tenderness at perineum, 5-7 o'clock, 1st layer, tightness at R pubirectalis, ischicavernosis, R,  lowered urethra ,  cued for less ab overuse, able to demo 3/5 MMT after training    Strength fair squeeze, definite lift    Biofeedback tactile cues for circumferential and sequential contraction             [facility] Adult PT Treatment/Exercise - 08/12/23 1149       Therapeutic Activites    Other Therapeutic Activities reinforced increased propicoeption and body awareness the past week , and dedication to HEP      Neuro Re-ed    Neuro Re-ed Details  cued for less ab overuse, less cues needed for anterior tilit of pelvis      Manual Therapy   Internal Pelvic Floor STM/MWM at problem areas noted in pelvic floor assessment, provided tactile cues for circumferential and sequential contraction               HOME EXERCISE PROGRAM: See pt instruction section    ASSESSMENT:  CLINICAL IMPRESSION Pt has achieved 50% of her goals.   Pt no longer shows forward head posture, deviations to her spine, SIJ obliquities, deficits at [facility].  Pt is compliant with HEP and is IND with deep core coordination/ strengthening.   Currently, pt is  gradually improving with less pelvic floor mm tightness and is learning to coordinate upward movement of pelvic floor with less overuse of abdominal mm. Today, pt continued to require internal Tx to minimize perineal mm scar restrictions and mm tightness. Modified technique to minimize pain/ tenderness.  Suspect decreased R pelvic floor mm tightness will help minimize the tenderness she has been complaining at R groin/ lower quadrant of abdomen.   Pt will continue to benefit form skilled PT to gain more  mobility, awareness, propioception of pelvic floor for continence and more [facility] training to integrate to fitness movements and play with children.  Pt benefits from skilled PT.    OBJECTIVE IMPAIRMENTS decreased activity tolerance, decreased coordination, decreased endurance, decreased mobility, difficulty walking, decreased ROM, decreased strength, decreased safety awareness, hypomobility, increased muscle spasms, impaired flexibility, improper body mechanics, postural dysfunction, and pain. scar restrictions   ACTIVITY LIMITATIONS  self-care,  sleep, home chores, work tasks    PARTICIPATION LIMITATIONS:  community, gym activities    PERSONAL FACTORS     are also affecting patient's functional outcome.    REHAB POTENTIAL: Good   CLINICAL DECISION MAKING: Evolving/moderate complexity   EVALUATION COMPLEXITY: Moderate    PATIENT EDUCATION:    Education details: Showed pt anatomy images. Explained muscles attachments/ connection, physiology of deep core system/ spinal- thoracic-pelvis-lower kinetic chain as they relate to pt's presentation, Sx, and past Hx. Explained what and how these areas of deficits need to be restored to balance and function    See Therapeutic activity / neuromuscular  re-education section  Answered pt's questions.   Person educated: Patient Education method: Explanation, Demonstration, Tactile cues, Verbal cues, and Handouts Education comprehension: verbalized understanding, returned demonstration, verbal cues required, tactile cues required, and needs further education     PLAN: PT FREQUENCY: 1x/week   PT DURATION: 10 weeks   PLANNED INTERVENTIONS: Therapeutic exercises, Therapeutic activity, Neuromuscular re-education, Balance training, Gait training, Patient/Family education, Self Care, Joint mobilization, Spinal mobilization, Moist heat, Taping, and Manual therapy, dry needling.   PLAN FOR NEXT SESSION: See clinical impression for plan     GOALS: Goals reviewed with patient? Yes  SHORT TERM GOALS: Target date: 05/26/2023  Pt will demo IND with HEP                    Baseline: Not IND            Goal status: MET    LONG TERM GOALS: Target date: 09/28/2023    1.Pt will demo proper deep core coordination without chest breathing and optimal excursion of diaphragm/pelvic floor in order to promote spinal stability and pelvic floor function   Baseline: dyscoordination Goal status: MET   2.  Pt will demo > 5 pt change on FOTO  to improve QOL and function  PFDI Urinary baseline - 25  -> 21  Lower score = better function  Urinary Problem baseline- 52  --> 56  Higher score = better function  Prolapse-4  -> 17 ( still need to address 07/20/23 )  lower score = better function  Lumber baseline  - 83  -> 94    MET 07/20/23  Higher score = better function   Goal status: ongoing   3.  Pt will demo proper body mechanics in against gravity tasks and ADLs  work tasks, fitness  to minimize straining pelvic floor / back                  Baseline: not  IND, improper form that places strain on pelvic floor                Goal status:  Ongoing    4. Pt will demo decreased separation of diastasis to < 2 fingers width in order to promote stronger  intraabdominal pressure system for spinal / postural stability and pelvic floor function  Baseline: 3 fingers width before head lift, 1 fingers width after  Goal status:  MET 07/20/23  ( no separation)   5. Pt will demo increased strength for R LE and B hip abd to > 4/5 to promote postural stability and less leakage Baseline: RLE 4/5, L 5/5 , R hip ab 3/5  Goal status:  MET ( 07/20/23 )                           6. Pt will report Stool type 4 when she does not take Miralax in order to minimize straining and worsening prolapse.                         Baseline: When she remembers, she takes Miralax 3-4 x week. Without Miralax, pt has stool consistency Type 2-3.   With Miralax, consistency is a Type 4.                           Goal Status: Ongoing     7. Pt will demo proper upward movement of pelvic floor prior to simulated cough in order to improve SUI                         Baseline: not able to minimize SUI                         Goal Status:  MET                          8. Pt will be IND with scoliosis specific HEP to minimize R shoulder pain with ER/IR and L knee pain  and to maintain levelled alignment of pelvis and spine                         Baseline:  Shoulder pain with ER/IR ,  L knee pain with sitting for 10 min to 1 hour :                        Goal Status:  MET     9. Pt will be IND with deep core level 1-2 HEP to minimize DRA, LBP and promote continence Baseline:intermittent LBP :   when lying on her back at night in bed , 4/10 pain,  Pt avoids lots of activities with fear of leaking, with kids activities.  Goal Status:  MET  Pt had no LBP when lying on her back at night for the post month   10. Pt will feel comfortable engaging in activities with kids with less fear of leakage , jumping on trampoline, dancing, without needing to go the bathroom  Baseline: avoids jumping on trampoline Goal Status: Ongoing  Mariane Masters, PT 08/12/2023, 9:42 AM

## 2023-08-18 ENCOUNTER — Ambulatory Visit: Payer: BC Managed Care – PPO | Admitting: Physical Therapy

## 2023-08-18 DIAGNOSIS — M62838 Other muscle spasm: Secondary | ICD-10-CM

## 2023-08-18 DIAGNOSIS — M533 Sacrococcygeal disorders, not elsewhere classified: Secondary | ICD-10-CM

## 2023-08-18 DIAGNOSIS — M21372 Foot drop, left foot: Secondary | ICD-10-CM

## 2023-08-18 DIAGNOSIS — R278 Other lack of coordination: Secondary | ICD-10-CM

## 2023-08-18 NOTE — Therapy (Signed)
OUTPATIENT PHYSICAL THERAPY TREATMENT  Patient Name: Lisa Brady MRN: [id number] DOB:1986-07-02, 37 y.o., female Today's Date: 08/18/2023  PT End of Session - 08/18/23 1340     Visit Number 11    Number of Visits 17    Date for PT Re-Evaluation 09/28/23    PT Start Time 1334    PT Stop Time 1415    PT Time Calculation (min) 41 min    Activity Tolerance Patient tolerated treatment well;No increased pain    Behavior During Therapy [facility] for tasks assessed/performed               Past Medical History:  Diagnosis Date   Choroid plexus cyst of fetus on prenatal ultrasound 05/29/2021   Dysplastic nevus 04/01/2021   right medial forearm, mod Atypia    Dysplastic nevus 04/01/2021   right inferior axilla, mild atypia    Dysplastic nevus 04/01/2021   right superior medial scapula, mild atypia    Dysplastic nevus 05/05/2022   Right mid med scapula - moderate   Dysplastic nevus 05/05/2022   Right mid to sup buttock - moderate   Dysplastic nevus 05/04/2023   right mid med scapula- recurrent   Foot drop    History of left foot drop 08/17/2019   History of vacuum extraction assisted delivery 04/03/2021   Hypermobility arthralgia    Maternal varicella, non-immune 11/20/2018   Scoliosis    Urinary incontinence    Wears glasses    Past Surgical History:  Procedure Laterality Date   WISDOM TOOTH EXTRACTION  2006   Patient Active Problem List   Diagnosis Date Noted   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   Eyelid dermatitis, allergic/contact 02/11/2023   Hypermobility arthralgia 02/25/2021   Chronic pain of right knee 09/17/2020   Urinary incontinence 08/17/2019    PCP: Chestine Spore   REFERRING PROVIDER: Alfonse Alpers  REFERRING DIAG: Mixed stress and urge urinary incontinence   Rationale for Evaluation and Treatment Rehabilitation  THERAPY DIAG:  Other muscle spasm  Sacrococcygeal disorders, not elsewhere classified  Other lack of coordination  ONSET DATE:   SUBJECTIVE:            SUBJECTIVE STATEMENT:                                                     Pt reported she is off in alignment in the midback after not being able to do her exercises for a few days. Shoulder and low back and pelvis feels fine   SUBJECTIVE STATEMENT ON EVAL 04/27/23 : Pt had urethral bulking one year ago. It helped with her incontinence which is why she did not keep up with her PT exercises. But the Tx did not help with prolapse.  1) prolapse Sx:  Pt had a long lapse between Pelvic PT.  Pt notices more pressure sensation and more urgency when she is constipation. Denied recurrent UTI. Pt is able to empty urine ok, when she is more constipated , she feels she does have more difficulty emptying completely.   2)  Constipation:  Pt noticed when she is constipated, she notices more prolapse and urgency.  If she is good about taking Miralax, pt has BMs daily and does not strain. When she remembers, she takes Miralax 3-4 x week. Without Miralax, pt has stool consistency Type 2-3.   With Miralax, consistency is  a Type 4.   Daily water intake: 70 oz a day water when working,  60 fl oz of water when she not working, Pt is trying to add more fiber.   Pt had constipation issues on and off for a long time. Pt had 2 children with 2nd degree perineal scars.   3) SUI: pt has leakage with laughing, coughing, sneezing. Pt empties frequently just in case before leaving to activities. Pt avoids lots of activities with fear of leaking, with kids activities.   4) intermittent LBP :   when lying on her back at night in bed , 4/10 pain,central location , nonradiating.   5) L knee pain with sitting for 10 min to 1 hour : located at the back for knee which causes her pain when she stands up after sitting .  Pain 4-5/10 .  Resolves quickly.   6) R shoulder tightness: with ER/ IR   PERTINENT HISTORY:  Pt had 2 children with 2nd degree perineal scars. Hx of Hypermobility . 2nd child weighs 25 lbs and she is needing to  carried by pt. Pt has tried kegel exercise sbut she felt dyscoordinated at her abs.  Pt currently wears a shoe lift in her R shoe.  PAIN:  Are you having pain? Yes:   PRECAUTIONS: None  WEIGHT BEARING RESTRICTIONS: No  FALLS:  Has patient fallen in last 6 months? No  LIVING ENVIRONMENT: Lives with: lives with their family Lives in: House/apartment Stairs: 2 story    OCCUPATION: physician   PLOF: Independent  PATIENT GOALS:  Try to commit to 15 min to lunch break to PT HEP and while kids are napping,  and to keep accountability for Pelvic PT HEP   Pt would like to not pee on herself and avoid prolapse surgery  Not leak with swimming    OBJECTIVE:     [facility] PT Assessment - 08/18/23 1341       Palpation   SI assessment  shoulder levelled, R iliac crest lowered    Palpation comment hypombility at T10,rib t10 with intercostal mm tightness anteriorly L             [facility] Adult PT Treatment/Exercise - 08/18/23 1416       Neuro Re-ed    Neuro Re-ed Details  cued for excursion of L T9-11 L intercostal      Manual Therapy   Internal Pelvic Floor STM/MWM to address hypombility at T10,rib t10 with intercostal mm tightness anteriorly L               HOME EXERCISE PROGRAM: See pt instruction section    ASSESSMENT:  CLINICAL IMPRESSION  Pt had slight relapse of thorax rotation this week. Pt showed limited excursion of T10 rib and hypomobility at T10 on L. Manual Tx was modified to minimize increased pain and tenderness promoted more excursion of T10 rib which will help with trunk stability and deep core IAP system for pelvic issues. Pt plans to return to her HEP and maintain consistency.  Plan to add resistance bands for thoracolumbar strengthening at next session. Pt will continue to benefit form skilled PT to gain more  mobility, awareness, propioception of pelvic floor for continence and more [facility] training to integrate to fitness movements and play with children.   Pt benefits from skilled PT.    OBJECTIVE IMPAIRMENTS decreased activity tolerance, decreased coordination, decreased endurance, decreased mobility, difficulty walking, decreased ROM, decreased strength, decreased safety awareness, hypomobility, increased muscle spasms, impaired flexibility,  improper body mechanics, postural dysfunction, and pain. scar restrictions   ACTIVITY LIMITATIONS  self-care,  sleep, home chores, work tasks    PARTICIPATION LIMITATIONS:  community, gym activities    PERSONAL FACTORS     are also affecting patient's functional outcome.    REHAB POTENTIAL: Good   CLINICAL DECISION MAKING: Evolving/moderate complexity   EVALUATION COMPLEXITY: Moderate    PATIENT EDUCATION:    Education details: Showed pt anatomy images. Explained muscles attachments/ connection, physiology of deep core system/ spinal- thoracic-pelvis-lower kinetic chain as they relate to pt's presentation, Sx, and past Hx. Explained what and how these areas of deficits need to be restored to balance and function    See Therapeutic activity / neuromuscular re-education section  Answered pt's questions.   Person educated: Patient Education method: Explanation, Demonstration, Tactile cues, Verbal cues, and Handouts Education comprehension: verbalized understanding, returned demonstration, verbal cues required, tactile cues required, and needs further education     PLAN: PT FREQUENCY: 1x/week   PT DURATION: 10 weeks   PLANNED INTERVENTIONS: Therapeutic exercises, Therapeutic activity, Neuromuscular re-education, Balance training, Gait training, Patient/Family education, Self Care, Joint mobilization, Spinal mobilization, Moist heat, Taping, and Manual therapy, dry needling.   PLAN FOR NEXT SESSION: See clinical impression for plan     GOALS: Goals reviewed with patient? Yes  SHORT TERM GOALS: Target date: 05/26/2023  Pt will demo IND with HEP                    Baseline: Not IND             Goal status: MET    LONG TERM GOALS: Target date: 09/28/2023    1.Pt will demo proper deep core coordination without chest breathing and optimal excursion of diaphragm/pelvic floor in order to promote spinal stability and pelvic floor function   Baseline: dyscoordination Goal status: MET   2.  Pt will demo > 5 pt change on FOTO  to improve QOL and function  PFDI Urinary baseline - 25  -> 21  Lower score = better function  Urinary Problem baseline- 52  --> 56  Higher score = better function  Prolapse-4  -> 17 ( still need to address 07/20/23 )  lower score = better function  Lumber baseline  - 83  -> 94    MET 07/20/23  Higher score = better function   Goal status: ongoing   3.  Pt will demo proper body mechanics in against gravity tasks and ADLs  work tasks, fitness  to minimize straining pelvic floor / back                  Baseline: not IND, improper form that places strain on pelvic floor                Goal status:  Ongoing    4. Pt will demo decreased separation of diastasis to < 2 fingers width in order to promote stronger intraabdominal pressure system for spinal / postural stability and pelvic floor function  Baseline: 3 fingers width before head lift, 1 fingers width after  Goal status:  MET 07/20/23  ( no separation)   5. Pt will demo increased strength for R LE and B hip abd to > 4/5 to promote postural stability and less leakage Baseline: RLE 4/5, L 5/5 , R hip ab 3/5  Goal status:  MET ( 07/20/23 )  6. Pt will report Stool type 4 when she does not take Miralax in order to minimize straining and worsening prolapse.                         Baseline: When she remembers, she takes Miralax 3-4 x week. Without Miralax, pt has stool consistency Type 2-3.   With Miralax, consistency is a Type 4.                           Goal Status: Ongoing     7. Pt will demo proper upward movement of pelvic floor prior to simulated cough in order to  improve SUI                         Baseline: not able to minimize SUI                         Goal Status:  MET                          8. Pt will be IND with scoliosis specific HEP to minimize R shoulder pain with ER/IR and L knee pain  and to maintain levelled alignment of pelvis and spine                         Baseline:  Shoulder pain with ER/IR ,  L knee pain with sitting for 10 min to 1 hour :                        Goal Status:  MET     9. Pt will be IND with deep core level 1-2 HEP to minimize DRA, LBP and promote continence Baseline:intermittent LBP :   when lying on her back at night in bed , 4/10 pain,  Pt avoids lots of activities with fear of leaking, with kids activities.  Goal Status:  MET  Pt had no LBP when lying on her back at night for the post month   10. Pt will feel comfortable engaging in activities with kids with less fear of leakage , jumping on trampoline, dancing, without needing to go the bathroom  Baseline: avoids jumping on trampoline Goal Status: Ongoing  Mariane Masters, PT 08/18/2023, 1:41 PM

## 2023-08-24 ENCOUNTER — Telehealth: Payer: Self-pay | Admitting: Physical Therapy

## 2023-08-24 ENCOUNTER — Ambulatory Visit: Payer: BC Managed Care – PPO | Admitting: Physical Therapy

## 2023-08-24 NOTE — Telephone Encounter (Signed)
LVM for patient to call back ASAP and offered her Thursday appointment and Friday appointment times that are available. Left our number as well.

## 2023-09-02 ENCOUNTER — Ambulatory Visit: Payer: BC Managed Care – PPO | Admitting: Physical Therapy

## 2023-09-02 DIAGNOSIS — M21372 Foot drop, left foot: Secondary | ICD-10-CM

## 2023-09-02 DIAGNOSIS — M533 Sacrococcygeal disorders, not elsewhere classified: Secondary | ICD-10-CM

## 2023-09-02 DIAGNOSIS — M62838 Other muscle spasm: Secondary | ICD-10-CM

## 2023-09-02 DIAGNOSIS — R278 Other lack of coordination: Secondary | ICD-10-CM

## 2023-09-02 NOTE — Therapy (Signed)
OUTPATIENT PHYSICAL THERAPY TREATMENT  Patient Name: Lisa Brady MRN: [id number] DOB:01-06-1986, 37 y.o., female Today's Date: 09/02/2023  PT End of Session - 09/02/23 0940     Visit Number 12    Number of Visits 17    Date for PT Re-Evaluation 09/28/23    PT Start Time 0937    PT Stop Time 1015    PT Time Calculation (min) 38 min    Activity Tolerance Patient tolerated treatment well;No increased pain    Behavior During Therapy [facility] for tasks assessed/performed               Past Medical History:  Diagnosis Date   Choroid plexus cyst of fetus on prenatal ultrasound 05/29/2021   Dysplastic nevus 04/01/2021   right medial forearm, mod Atypia    Dysplastic nevus 04/01/2021   right inferior axilla, mild atypia    Dysplastic nevus 04/01/2021   right superior medial scapula, mild atypia    Dysplastic nevus 05/05/2022   Right mid med scapula - moderate   Dysplastic nevus 05/05/2022   Right mid to sup buttock - moderate   Dysplastic nevus 05/04/2023   right mid med scapula- recurrent   Foot drop    History of left foot drop 08/17/2019   History of vacuum extraction assisted delivery 04/03/2021   Hypermobility arthralgia    Maternal varicella, non-immune 11/20/2018   Scoliosis    Urinary incontinence    Wears glasses    Past Surgical History:  Procedure Laterality Date   WISDOM TOOTH EXTRACTION  2006   Patient Active Problem List   Diagnosis Date Noted   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   Eyelid dermatitis, allergic/contact 02/11/2023   Hypermobility arthralgia 02/25/2021   Chronic pain of right knee 09/17/2020   Urinary incontinence 08/17/2019    PCP: Chestine Spore   REFERRING PROVIDER: Alfonse Alpers  REFERRING DIAG: Mixed stress and urge urinary incontinence   Rationale for Evaluation and Treatment Rehabilitation  THERAPY DIAG:  Other muscle spasm  Sacrococcygeal disorders, not elsewhere classified  Other lack of coordination  ONSET DATE:   SUBJECTIVE:            SUBJECTIVE STATEMENT:                                                     Pt reported she felt not as coordinated with the deep core exercises. Pt had 2 weeks of no leakage but then had one leakage yesterday. Pt had urgency and constipation more recently. Pt started taking Miralax the last few days.   SUBJECTIVE STATEMENT ON EVAL 04/27/23 : Pt had urethral bulking one year ago. It helped with her incontinence which is why she did not keep up with her PT exercises. But the Tx did not help with prolapse.  1) prolapse Sx:  Pt had a long lapse between Pelvic PT.  Pt notices more pressure sensation and more urgency when she is constipation. Denied recurrent UTI. Pt is able to empty urine ok, when she is more constipated , she feels she does have more difficulty emptying completely.   2)  Constipation:  Pt noticed when she is constipated, she notices more prolapse and urgency.  If she is good about taking Miralax, pt has BMs daily and does not strain. When she remembers, she takes Miralax 3-4 x week. Without Miralax, pt has  stool consistency Type 2-3.   With Miralax, consistency is a Type 4.   Daily water intake: 70 oz a day water when working,  60 fl oz of water when she not working, Pt is trying to add more fiber.   Pt had constipation issues on and off for a long time. Pt had 2 children with 2nd degree perineal scars.   3) SUI: pt has leakage with laughing, coughing, sneezing. Pt empties frequently just in case before leaving to activities. Pt avoids lots of activities with fear of leaking, with kids activities.   4) intermittent LBP :   when lying on her back at night in bed , 4/10 pain,central location , nonradiating.   5) L knee pain with sitting for 10 min to 1 hour : located at the back for knee which causes her pain when she stands up after sitting .  Pain 4-5/10 .  Resolves quickly.   6) R shoulder tightness: with ER/ IR   PERTINENT HISTORY:  Pt had 2 children with 2nd degree perineal  scars. Hx of Hypermobility . 2nd child weighs 25 lbs and she is needing to carried by pt. Pt has tried kegel exercise sbut she felt dyscoordinated at her abs.  Pt currently wears a shoe lift in her R shoe.  PAIN:  Are you having pain? Yes:   PRECAUTIONS: None  WEIGHT BEARING RESTRICTIONS: No  FALLS:  Has patient fallen in last 6 months? No  LIVING ENVIRONMENT: Lives with: lives with their family Lives in: House/apartment Stairs: 2 story    OCCUPATION: physician   PLOF: Independent  PATIENT GOALS:  Try to commit to 15 min to lunch break to PT HEP and while kids are napping,  and to keep accountability for Pelvic PT HEP   Pt would like to not pee on herself and avoid prolapse surgery  Not leak with swimming    OBJECTIVE:     [facility] PT Assessment - 09/02/23 1155       Palpation   SI assessment  levelled pelvis and shoulder   Hypomobile between R LE ray II-III, hypomobile midfoot joints ( medial cuneiform, intermediate cuneiform)             Pelvic Floor Special Questions - 09/02/23 1153     Pelvic Floor Internal Exam pt consented verbally without contraindications    Exam Type Vaginal    Palpation R tightness at anterior mm    Strength fair squeeze, definite lift    Biofeedback tactile cues for circumferential and sequential contraction             [facility] Adult PT Treatment/Exercise - 09/02/23 1154       Neuro Re-ed    Neuro Re-ed Details  cued for pelvic floor coordination with deep core level 1      Manual Therapy   Internal Pelvic Floor facilitating upward urethra position, coordinated pelvic floor with optimal diaphragm without oblique overuse    Manual Tx -----------------------------------------------------------PA/AP mob Grade III, STM/MWM to address Hypomobile between R LE ray II-III, hypomobile midfoot joints ( medial cuneiform, intermediate cuneiform)            HOME EXERCISE PROGRAM: See pt instruction section     ASSESSMENT:  CLINICAL IMPRESSION  Improvements this week: Pt had 2 weeks of no leakage but then had one leakage yesterday.   Internal pelvic floor assessment showed less mm tightness except at R anterior mm.  Facilitated more upward position of urethra with tactile, visual, verbal  cues for coordinated pelvic floor with optimal diaphragm without oblique overuse.  Pt showed improved awareness when overusing oblique mm. Still withholding kegel training until coordination has improved.    Manual Tx was applied to R foot to increase mobility. Provided sandal recommendations to minimize foot hypomobility.   Plan to add resistance bands for thoracolumbar strengthening at next session. Pt will continue to benefit form skilled PT to gain more  mobility, awareness, propioception of pelvic floor for continence and more [facility] training to integrate to fitness movements and play with children.  Pt benefits from skilled PT.    OBJECTIVE IMPAIRMENTS decreased activity tolerance, decreased coordination, decreased endurance, decreased mobility, difficulty walking, decreased ROM, decreased strength, decreased safety awareness, hypomobility, increased muscle spasms, impaired flexibility, improper body mechanics, postural dysfunction, and pain. scar restrictions   ACTIVITY LIMITATIONS  self-care,  sleep, home chores, work tasks    PARTICIPATION LIMITATIONS:  community, gym activities    PERSONAL FACTORS     are also affecting patient's functional outcome.    REHAB POTENTIAL: Good   CLINICAL DECISION MAKING: Evolving/moderate complexity   EVALUATION COMPLEXITY: Moderate    PATIENT EDUCATION:    Education details: Showed pt anatomy images. Explained muscles attachments/ connection, physiology of deep core system/ spinal- thoracic-pelvis-lower kinetic chain as they relate to pt's presentation, Sx, and past Hx. Explained what and how these areas of deficits need to be restored to balance and function     See Therapeutic activity / neuromuscular re-education section  Answered pt's questions.   Person educated: Patient Education method: Explanation, Demonstration, Tactile cues, Verbal cues, and Handouts Education comprehension: verbalized understanding, returned demonstration, verbal cues required, tactile cues required, and needs further education     PLAN: PT FREQUENCY: 1x/week   PT DURATION: 10 weeks   PLANNED INTERVENTIONS: Therapeutic exercises, Therapeutic activity, Neuromuscular re-education, Balance training, Gait training, Patient/Family education, Self Care, Joint mobilization, Spinal mobilization, Moist heat, Taping, and Manual therapy, dry needling.   PLAN FOR NEXT SESSION: See clinical impression for plan     GOALS: Goals reviewed with patient? Yes  SHORT TERM GOALS: Target date: 05/26/2023  Pt will demo IND with HEP                    Baseline: Not IND            Goal status: MET    LONG TERM GOALS: Target date: 09/28/2023    1.Pt will demo proper deep core coordination without chest breathing and optimal excursion of diaphragm/pelvic floor in order to promote spinal stability and pelvic floor function   Baseline: dyscoordination Goal status: MET   2.  Pt will demo > 5 pt change on FOTO  to improve QOL and function  PFDI Urinary baseline - 25  -> 21  Lower score = better function  Urinary Problem baseline- 52  --> 56  Higher score = better function  Prolapse-4  -> 17 ( still need to address 07/20/23 )  lower score = better function  Lumber baseline  - 83  -> 94    MET 07/20/23  Higher score = better function   Goal status: ongoing   3.  Pt will demo proper body mechanics in against gravity tasks and ADLs  work tasks, fitness  to minimize straining pelvic floor / back                  Baseline: not IND, improper form that places strain on pelvic floor  Goal status:  Ongoing    4. Pt will demo decreased separation of diastasis to < 2  fingers width in order to promote stronger intraabdominal pressure system for spinal / postural stability and pelvic floor function  Baseline: 3 fingers width before head lift, 1 fingers width after  Goal status:  MET 07/20/23  ( no separation)   5. Pt will demo increased strength for R LE and B hip abd to > 4/5 to promote postural stability and less leakage Baseline: RLE 4/5, L 5/5 , R hip ab 3/5  Goal status:  MET ( 07/20/23 )                           6. Pt will report Stool type 4 when she does not take Miralax in order to minimize straining and worsening prolapse.                         Baseline: When she remembers, she takes Miralax 3-4 x week. Without Miralax, pt has stool consistency Type 2-3.   With Miralax, consistency is a Type 4.                           Goal Status: Ongoing     7. Pt will demo proper upward movement of pelvic floor prior to simulated cough in order to improve SUI                         Baseline: not able to minimize SUI                         Goal Status:  MET                          8. Pt will be IND with scoliosis specific HEP to minimize R shoulder pain with ER/IR and L knee pain  and to maintain levelled alignment of pelvis and spine                         Baseline:  Shoulder pain with ER/IR ,  L knee pain with sitting for 10 min to 1 hour :                        Goal Status:  MET     9. Pt will be IND with deep core level 1-2 HEP to minimize DRA, LBP and promote continence Baseline:intermittent LBP :   when lying on her back at night in bed , 4/10 pain,  Pt avoids lots of activities with fear of leaking, with kids activities.  Goal Status:  MET  Pt had no LBP when lying on her back at night for the post month   10. Pt will feel comfortable engaging in activities with kids with less fear of leakage , jumping on trampoline, dancing, without needing to go the bathroom  Baseline: avoids jumping on trampoline Goal Status: Ongoing  Mariane Masters,  PT 09/02/2023, 9:41 AM

## 2023-09-09 ENCOUNTER — Ambulatory Visit: Payer: BC Managed Care – PPO | Attending: Family | Admitting: Physical Therapy

## 2023-09-09 DIAGNOSIS — M533 Sacrococcygeal disorders, not elsewhere classified: Secondary | ICD-10-CM

## 2023-09-09 DIAGNOSIS — R278 Other lack of coordination: Secondary | ICD-10-CM

## 2023-09-09 DIAGNOSIS — M62838 Other muscle spasm: Secondary | ICD-10-CM

## 2023-09-09 DIAGNOSIS — M21372 Foot drop, left foot: Secondary | ICD-10-CM | POA: Diagnosis present

## 2023-09-09 NOTE — Therapy (Signed)
OUTPATIENT PHYSICAL THERAPY TREATMENT  Patient Name: Lisa Brady MRN: [id number] DOB:1986-10-12, 37 y.o., female Today's Date: 09/09/2023  PT End of Session - 09/09/23 1021     Visit Number 13    Number of Visits 17    Date for PT Re-Evaluation 09/28/23    PT Start Time 1019    PT Stop Time 1100    PT Time Calculation (min) 41 min    Activity Tolerance Patient tolerated treatment well;No increased pain    Behavior During Therapy [facility] for tasks assessed/performed               Past Medical History:  Diagnosis Date   Choroid plexus cyst of fetus on prenatal ultrasound 05/29/2021   Dysplastic nevus 04/01/2021   right medial forearm, mod Atypia    Dysplastic nevus 04/01/2021   right inferior axilla, mild atypia    Dysplastic nevus 04/01/2021   right superior medial scapula, mild atypia    Dysplastic nevus 05/05/2022   Right mid med scapula - moderate   Dysplastic nevus 05/05/2022   Right mid to sup buttock - moderate   Dysplastic nevus 05/04/2023   right mid med scapula- recurrent   Foot drop    History of left foot drop 08/17/2019   History of vacuum extraction assisted delivery 04/03/2021   Hypermobility arthralgia    Maternal varicella, non-immune 11/20/2018   Scoliosis    Urinary incontinence    Wears glasses    Past Surgical History:  Procedure Laterality Date   WISDOM TOOTH EXTRACTION  2006   Patient Active Problem List   Diagnosis Date Noted   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   Eyelid dermatitis, allergic/contact 02/11/2023   Hypermobility arthralgia 02/25/2021   Chronic pain of right knee 09/17/2020   Urinary incontinence 08/17/2019    PCP: Chestine Spore   REFERRING PROVIDER: Alfonse Alpers  REFERRING DIAG: Mixed stress and urge urinary incontinence   Rationale for Evaluation and Treatment Rehabilitation  THERAPY DIAG:  Other muscle spasm  Sacrococcygeal disorders, not elsewhere classified  Other lack of coordination  ONSET DATE:   SUBJECTIVE:            SUBJECTIVE STATEMENT:                                                     Pt reported she had a nsal congestion and noticed leakage with some sneezing   SUBJECTIVE STATEMENT ON EVAL 04/27/23 : Pt had urethral bulking one year ago. It helped with her incontinence which is why she did not keep up with her PT exercises. But the Tx did not help with prolapse.  1) prolapse Sx:  Pt had a long lapse between Pelvic PT.  Pt notices more pressure sensation and more urgency when she is constipation. Denied recurrent UTI. Pt is able to empty urine ok, when she is more constipated , she feels she does have more difficulty emptying completely.   2)  Constipation:  Pt noticed when she is constipated, she notices more prolapse and urgency.  If she is good about taking Miralax, pt has BMs daily and does not strain. When she remembers, she takes Miralax 3-4 x week. Without Miralax, pt has stool consistency Type 2-3.   With Miralax, consistency is a Type 4.   Daily water intake: 70 oz a day water when working,  60  fl oz of water when she not working, Pt is trying to add more fiber.   Pt had constipation issues on and off for a long time. Pt had 2 children with 2nd degree perineal scars.   3) SUI: pt has leakage with laughing, coughing, sneezing. Pt empties frequently just in case before leaving to activities. Pt avoids lots of activities with fear of leaking, with kids activities.   4) intermittent LBP :   when lying on her back at night in bed , 4/10 pain,central location , nonradiating.   5) L knee pain with sitting for 10 min to 1 hour : located at the back for knee which causes her pain when she stands up after sitting .  Pain 4-5/10 .  Resolves quickly.   6) R shoulder tightness: with ER/ IR   PERTINENT HISTORY:  Pt had 2 children with 2nd degree perineal scars. Hx of Hypermobility . 2nd child weighs 25 lbs and she is needing to carried by pt. Pt has tried kegel exercise sbut she felt dyscoordinated at  her abs.  Pt currently wears a shoe lift in her R shoe.  PAIN:  Are you having pain? Yes:   PRECAUTIONS: None  WEIGHT BEARING RESTRICTIONS: No  FALLS:  Has patient fallen in last 6 months? No  LIVING ENVIRONMENT: Lives with: lives with their family Lives in: House/apartment Stairs: 2 story    OCCUPATION: physician   PLOF: Independent  PATIENT GOALS:  Try to commit to 15 min to lunch break to PT HEP and while kids are napping,  and to keep accountability for Pelvic PT HEP   Pt would like to not pee on herself and avoid prolapse surgery  Not leak with swimming    OBJECTIVE:     Pelvic Floor Special Questions - 09/09/23 1055     External Perineal Exam initially with posterior tilt , poor priopcetion of feet , R anterior  pelvic floor tightness             [facility] Adult PT Treatment/Exercise - 09/09/23 1057       Therapeutic Activites    Other Therapeutic Activities explained quick contraction of kegels      Neuro Re-ed    Neuro Re-ed Details  excessive cues for anterior tilt of pelvis, feet propioception      Manual Therapy   Internal Pelvic Floor external through clothing, STM/MWM at R anterior mm pelvic floor               HOME EXERCISE PROGRAM: See pt instruction section    ASSESSMENT:  CLINICAL IMPRESSION  Externall pelvic floor assessment showed  mm tightness  at R anterior mm.  Excessive cues for anterior tilt of pelvis, feet propioception for quick contraction of pelvic floor.   Kegel contractions in hooklying were improved with excessive verbal tactile cues.   Plan on progressing to long holds next session.  Pt will continue to benefit form skilled PT to gain more  mobility, awareness, propioception of pelvic floor for continence and more [facility] training to integrate to fitness movements and play with children.  Pt benefits from skilled PT.    OBJECTIVE IMPAIRMENTS decreased activity tolerance, decreased coordination, decreased endurance,  decreased mobility, difficulty walking, decreased ROM, decreased strength, decreased safety awareness, hypomobility, increased muscle spasms, impaired flexibility, improper body mechanics, postural dysfunction, and pain. scar restrictions   ACTIVITY LIMITATIONS  self-care,  sleep, home chores, work tasks    PARTICIPATION LIMITATIONS:  community, gym activities  PERSONAL FACTORS     are also affecting patient's functional outcome.    REHAB POTENTIAL: Good   CLINICAL DECISION MAKING: Evolving/moderate complexity   EVALUATION COMPLEXITY: Moderate    PATIENT EDUCATION:    Education details: Showed pt anatomy images. Explained muscles attachments/ connection, physiology of deep core system/ spinal- thoracic-pelvis-lower kinetic chain as they relate to pt's presentation, Sx, and past Hx. Explained what and how these areas of deficits need to be restored to balance and function    See Therapeutic activity / neuromuscular re-education section  Answered pt's questions.   Person educated: Patient Education method: Explanation, Demonstration, Tactile cues, Verbal cues, and Handouts Education comprehension: verbalized understanding, returned demonstration, verbal cues required, tactile cues required, and needs further education     PLAN: PT FREQUENCY: 1x/week   PT DURATION: 10 weeks   PLANNED INTERVENTIONS: Therapeutic exercises, Therapeutic activity, Neuromuscular re-education, Balance training, Gait training, Patient/Family education, Self Care, Joint mobilization, Spinal mobilization, Moist heat, Taping, and Manual therapy, dry needling.   PLAN FOR NEXT SESSION: See clinical impression for plan     GOALS: Goals reviewed with patient? Yes  SHORT TERM GOALS: Target date: 05/26/2023  Pt will demo IND with HEP                    Baseline: Not IND            Goal status: MET    LONG TERM GOALS: Target date: 09/28/2023    1.Pt will demo proper deep core coordination without  chest breathing and optimal excursion of diaphragm/pelvic floor in order to promote spinal stability and pelvic floor function   Baseline: dyscoordination Goal status: MET   2.  Pt will demo > 5 pt change on FOTO  to improve QOL and function  PFDI Urinary baseline - 25  -> 21  Lower score = better function  Urinary Problem baseline- 52  --> 56  Higher score = better function  Prolapse-4  -> 17 ( still need to address 07/20/23 )  lower score = better function  Lumber baseline  - 83  -> 94    MET 07/20/23  Higher score = better function   Goal status: ongoing   3.  Pt will demo proper body mechanics in against gravity tasks and ADLs  work tasks, fitness  to minimize straining pelvic floor / back                  Baseline: not IND, improper form that places strain on pelvic floor                Goal status:  Ongoing    4. Pt will demo decreased separation of diastasis to < 2 fingers width in order to promote stronger intraabdominal pressure system for spinal / postural stability and pelvic floor function  Baseline: 3 fingers width before head lift, 1 fingers width after  Goal status:  MET 07/20/23  ( no separation)   5. Pt will demo increased strength for R LE and B hip abd to > 4/5 to promote postural stability and less leakage Baseline: RLE 4/5, L 5/5 , R hip ab 3/5  Goal status:  MET ( 07/20/23 )                           6. Pt will report Stool type 4 when she does not take Miralax in order to minimize straining and worsening prolapse.  Baseline: When she remembers, she takes Miralax 3-4 x week. Without Miralax, pt has stool consistency Type 2-3.   With Miralax, consistency is a Type 4.                           Goal Status: Ongoing     7. Pt will demo proper upward movement of pelvic floor prior to simulated cough in order to improve SUI                         Baseline: not able to minimize SUI                         Goal Status:  MET                           8. Pt will be IND with scoliosis specific HEP to minimize R shoulder pain with ER/IR and L knee pain  and to maintain levelled alignment of pelvis and spine                         Baseline:  Shoulder pain with ER/IR ,  L knee pain with sitting for 10 min to 1 hour :                        Goal Status:  MET     9. Pt will be IND with deep core level 1-2 HEP to minimize DRA, LBP and promote continence Baseline:intermittent LBP :   when lying on her back at night in bed , 4/10 pain,  Pt avoids lots of activities with fear of leaking, with kids activities.  Goal Status:  MET  Pt had no LBP when lying on her back at night for the post month   10. Pt will feel comfortable engaging in activities with kids with less fear of leakage , jumping on trampoline, dancing, without needing to go the bathroom  Baseline: avoids jumping on trampoline Goal Status: Ongoing  Mariane Masters, PT 09/09/2023, 10:22 AM

## 2023-09-09 NOTE — Patient Instructions (Signed)
  Laying on your back  Morning and night    On your back, slight anterior tilt of pelvis, feet firm, knees bent, Feet hip width apart    Mon Tue Wed Thurs Fri  Sat Sun           quick  Pelvic floor squeeze  5 reps           Deep core level 1 ( 10 breaths)   quick  Pelvic floor squeeze  5 reps            Deep core level 2 ( 6 min)   quick  Pelvic floor squeeze  5 reps

## 2023-09-20 ENCOUNTER — Encounter: Payer: BC Managed Care – PPO | Admitting: Physical Therapy

## 2023-09-30 ENCOUNTER — Other Ambulatory Visit: Payer: Self-pay | Admitting: Primary Care

## 2023-09-30 ENCOUNTER — Ambulatory Visit: Payer: BC Managed Care – PPO | Admitting: Physical Therapy

## 2023-09-30 DIAGNOSIS — M62838 Other muscle spasm: Secondary | ICD-10-CM

## 2023-09-30 DIAGNOSIS — R110 Nausea: Secondary | ICD-10-CM

## 2023-09-30 DIAGNOSIS — R278 Other lack of coordination: Secondary | ICD-10-CM

## 2023-09-30 DIAGNOSIS — M533 Sacrococcygeal disorders, not elsewhere classified: Secondary | ICD-10-CM

## 2023-09-30 DIAGNOSIS — M21372 Foot drop, left foot: Secondary | ICD-10-CM

## 2023-09-30 MED ORDER — ONDANSETRON 4 MG PO TBDP: 4.0000 mg | ORAL_TABLET | Freq: Three times a day (TID) | ORAL | 0 refills | Status: DC | PRN

## 2023-09-30 NOTE — Patient Instructions (Addendum)
Minisquat: with side step  Scoot buttocks back slight, hinge like you are looking at your reflection on a pond  Knees behind toes,  Inhale to "smell flowers"  Exhale on the rise "like rocket"  Do not lock knees, have more weight across ballmounds of feet, toes relaxed   PULL Green band in front, elbows bent on exhale    THEN HALF step on both feet first lap with left foot leading down a hall way = 1 lap  Repeated with other foot leading =1 lap    3  laps each side   ___________________   Standing PNF band exercises  Stand 45 deg to doorway, front leg is opp of hand holding band   "Drawing a sword" " Pulling a lawnmover" Band is under feet, hold band in L hand, across body by R pocket Stand with toes, knees, and pelvis turned 45 deg away from the door,  ski track stance, 1st ballmounds down, knee aligned 2-3rd toes.   Press downward into the ballmounds and heels of the feet, thigh muscle active, Keep knees and hips squared the front and do not move them throughout the activity, Exhale to pull band from R pocket,  across the face to the R pocket, rotating only your ribcage to L as if "drawing a sword"  Follow hand with eyes/head, elbow by ribs, arm is kept at 90 deg forearm to upper arm   10 x 2 reps      "Pulling seat belt"   Band over door knob with knot on the other side of the door Stand with toes, knees, and pelvis turned 45 deg away from the door,  ski track stance, 1st ballmounds down, knee aligned 2-3rd toes.  Exhale to pull band from R ear height across body to the L pocket without turning pelvis and knees  Follow hand with eyes/head, elbow by ribs, arm is kept at 90 deg forearm to upper arm    30 reps each side    ________   LOCUST WITH single green band   20 reps

## 2023-09-30 NOTE — Therapy (Signed)
OUTPATIENT PHYSICAL THERAPY TREATMENT / Recert   Patient Name: Lisa Brady MRN: [id number] DOB:January 26, 1986, 37 y.o., female Today's Date: 09/30/2023  PT End of Session - 09/30/23 0932     Visit Number 14    Number of Visits 24    Date for PT Re-Evaluation 12/09/23    PT Start Time 0930    PT Stop Time 1015    PT Time Calculation (min) 45 min    Activity Tolerance Patient tolerated treatment well;No increased pain    Behavior During Therapy [facility] for tasks assessed/performed               Past Medical History:  Diagnosis Date   Choroid plexus cyst of fetus on prenatal ultrasound 05/29/2021   Dysplastic nevus 04/01/2021   right medial forearm, mod Atypia    Dysplastic nevus 04/01/2021   right inferior axilla, mild atypia    Dysplastic nevus 04/01/2021   right superior medial scapula, mild atypia    Dysplastic nevus 05/05/2022   Right mid med scapula - moderate   Dysplastic nevus 05/05/2022   Right mid to sup buttock - moderate   Dysplastic nevus 05/04/2023   right mid med scapula- recurrent   Foot drop    History of left foot drop 08/17/2019   History of vacuum extraction assisted delivery 04/03/2021   Hypermobility arthralgia    Maternal varicella, non-immune 11/20/2018   Scoliosis    Urinary incontinence    Wears glasses    Past Surgical History:  Procedure Laterality Date   WISDOM TOOTH EXTRACTION  2006   Patient Active Problem List   Diagnosis Date Noted   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   Eyelid dermatitis, allergic/contact 02/11/2023   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 It helped with her incontinence which is why she did not keep up with her PT exercises. But the Tx did not help with prolapse.  1) prolapse Sx:  Pt had a long lapse between Pelvic PT.  Pt notices more pressure sensation and more urgency when she is constipation. Denied recurrent UTI. Pt is able to empty urine ok, when she is more constipated , she feels she does have more difficulty emptying completely.   2)  Constipation:  Pt noticed when she is constipated, she notices more prolapse and urgency.  If she is good about taking Miralax, pt has BMs daily and does not strain. When she remembers, she takes Miralax 3-4 x week. Without Miralax, pt has stool consistency Type 2-3.   With Miralax, consistency is a Type 4.   Daily water intake: 70 oz a day water when working,  60 fl oz  of water when she not working, Pt is trying to add more fiber.   Pt had constipation issues on and off for a long time. Pt had 2 children with 2nd degree perineal scars.   3) SUI: pt has leakage with laughing, coughing, sneezing. Pt empties frequently just in case before leaving to activities. Pt avoids lots of activities with fear of leaking, with kids activities.   4) intermittent LBP :   when lying on her back at night in bed , 4/10 pain,central location , nonradiating.   5) L knee pain with sitting for 10 min to 1 hour : located at the back for knee which causes her pain when she stands up after sitting .  Pain 4-5/10 .  Resolves quickly.   6) R shoulder tightness: with ER/ IR   PERTINENT HISTORY:  Pt had 2 children with 2nd degree perineal scars. Hx of Hypermobility . 2nd child weighs 25 lbs and she is needing to carried by pt. Pt has tried kegel exercise sbut she felt dyscoordinated at her abs.  Pt  currently wears a shoe lift in her R shoe.  PAIN:  Are you having pain? Yes:   PRECAUTIONS: None  WEIGHT BEARING RESTRICTIONS: No  FALLS:  Has patient fallen in last 6 months? No  LIVING ENVIRONMENT: Lives with: lives with their family Lives in: House/apartment Stairs: 2 story    OCCUPATION: physician   PLOF: Independent  PATIENT GOALS:  Try to commit to 15 min to lunch break to PT HEP and while kids are napping,  and to keep accountability for Pelvic PT HEP   Pt would like to not pee on herself and avoid prolapse surgery  Not leak with swimming    OBJECTIVE:     [facility] PT Assessment - 09/30/23 0938       Observation/Other Assessments   Observations rounded shoulders but thoracic kyphpsis much improved      Squat   Comments improved form      Sit to Stand   Comments adducted knees in rise              [facility] Adult PT Treatment/Exercise - 09/30/23 1209       Therapeutic Activites    Other Therapeutic Activities explained movement pattern retraining to help minimize twinges of LBP, explained to pick up feet with turns and lifting and dishwashing      Neuro Re-ed    Neuro Re-ed Details  excessive cues for minisquat and new HEP with resistance bands with cues for stability through [facility]      Exercises   Other Exercises  see pt isntructions               HOME EXERCISE PROGRAM: See pt instruction section    ASSESSMENT:  CLINICAL IMPRESSION Pt has achieved 6/10 goals and progressing well towards remaining goals. Pt is entering more functional strengthening phase . Initiated more resistance band strengthening with PNF movement patterns and bending with proper squat pattern. Pt required excessive cues for new HEP progressions. Pt demo'd improved technique post training. Modified one exercise due to increased lumbar lordosis and with new modification, pt demo'd improved technique.   Plan on progressing to long holds  pelvic floor contractions  next  session.  Pt will continue to benefit form skilled PT to gain more  mobility, awareness, propioception of pelvic floor for continence and more [facility] training to integrate to fitness movements and play with children.  Pt benefits from skilled PT.  OBJECTIVE IMPAIRMENTS decreased activity tolerance, decreased coordination, decreased endurance, decreased mobility, difficulty walking, decreased ROM, decreased strength, decreased safety awareness, hypomobility, increased muscle spasms, impaired flexibility, improper body mechanics, postural dysfunction, and pain. scar restrictions   ACTIVITY LIMITATIONS  self-care,  sleep, home chores, work tasks    PARTICIPATION LIMITATIONS:  community, gym activities    PERSONAL FACTORS     are also affecting patient's functional outcome.    REHAB POTENTIAL: Good   CLINICAL DECISION MAKING: Evolving/moderate complexity   EVALUATION COMPLEXITY: Moderate    PATIENT EDUCATION:    Education details: Showed pt anatomy images. Explained muscles attachments/ connection, physiology of deep core system/ spinal- thoracic-pelvis-lower kinetic chain as they relate to pt's presentation, Sx, and past Hx. Explained what and how these areas of deficits need to be restored to balance and function    See Therapeutic activity / neuromuscular re-education section  Answered pt's questions.   Person educated: Patient Education method: Explanation, Demonstration, Tactile cues, Verbal cues, and Handouts Education comprehension: verbalized understanding, returned demonstration, verbal cues required, tactile cues required, and needs further education     PLAN: PT FREQUENCY: 1x/week   PT DURATION: 10 weeks   PLANNED INTERVENTIONS: Therapeutic exercises, Therapeutic activity, Neuromuscular re-education, Balance training, Gait training, Patient/Family education, Self Care, Joint mobilization, Spinal mobilization, Moist heat, Taping, and Manual therapy, dry needling.   PLAN  FOR NEXT SESSION: See clinical impression for plan     GOALS: Goals reviewed with patient? Yes  SHORT TERM GOALS: Target date: 05/26/2023  Pt will demo IND with HEP                    Baseline: Not IND            Goal status: MET    LONG TERM GOALS: Target date: 12/09/2023     1.Pt will demo proper deep core coordination without chest breathing and optimal excursion of diaphragm/pelvic floor in order to promote spinal stability and pelvic floor function   Baseline: dyscoordination Goal status: MET   2.  Pt will demo > 5 pt change on FOTO  to improve QOL and function  PFDI Urinary baseline - 25  -> 21  Lower score = better function  Urinary Problem baseline- 52  --> 56  Higher score = better function  Prolapse-4  -> 17 ( still need to address 07/20/23 )  lower score = better function  Lumber baseline  - 83  -> 94    MET 07/20/23  Higher score = better function   Goal status: ongoing   3.  Pt will demo proper body mechanics in against gravity tasks and ADLs  work tasks, fitness  to minimize straining pelvic floor / back                  Baseline: not IND, improper form that places strain on pelvic floor                Goal status:  Ongoing    4. Pt will demo decreased separation of diastasis to < 2 fingers width in order to promote stronger intraabdominal pressure system for spinal / postural stability and pelvic floor function  Baseline: 3 fingers width before head lift, 1 fingers width after  Goal status:  MET 07/20/23  ( no separation)   5. Pt will demo increased strength for R LE and B hip abd to > 4/5 to promote postural stability and less leakage Baseline: RLE 4/5, L 5/5 ,  R hip ab 3/5  Goal status:  MET ( 07/20/23 )                           6. Pt will report Stool type 4 when she does not take Miralax in order to minimize straining and worsening prolapse.                         Baseline: When she remembers, she takes Miralax 3-4 x week. Without Miralax, pt has  stool consistency Type 2-3.   With Miralax, consistency is a Type 4.                           Goal Status: Ongoing     7. Pt will demo proper upward movement of pelvic floor prior to simulated cough in order to improve SUI                         Baseline: not able to minimize SUI                         Goal Status:  MET                          8. Pt will be IND with scoliosis specific HEP to minimize R shoulder pain with ER/IR and L knee pain  and to maintain levelled alignment of pelvis and spine                         Baseline:  Shoulder pain with ER/IR ,  L knee pain with sitting for 10 min to 1 hour :                        Goal Status:  MET     9. Pt will be IND with deep core level 1-2 HEP to minimize DRA, LBP and promote continence Baseline:intermittent LBP :   when lying on her back at night in bed , 4/10 pain,  Pt avoids lots of activities with fear of leaking, with kids activities.  Goal Status:  MET  Pt had no LBP when lying on her back at night for the post month   10. Pt will feel comfortable engaging in activities with kids with less fear of leakage , jumping on trampoline, dancing, without needing to go the bathroom  Baseline: avoids jumping on trampoline Goal Status: Ongoing    Mariane Masters, PT 09/30/2023, 12:18 PM

## 2023-10-07 ENCOUNTER — Ambulatory Visit: Payer: BC Managed Care – PPO | Attending: Family | Admitting: Physical Therapy

## 2023-10-07 DIAGNOSIS — M21372 Foot drop, left foot: Secondary | ICD-10-CM | POA: Diagnosis present

## 2023-10-07 DIAGNOSIS — M62838 Other muscle spasm: Secondary | ICD-10-CM | POA: Insufficient documentation

## 2023-10-07 DIAGNOSIS — M533 Sacrococcygeal disorders, not elsewhere classified: Secondary | ICD-10-CM | POA: Insufficient documentation

## 2023-10-07 DIAGNOSIS — R278 Other lack of coordination: Secondary | ICD-10-CM | POA: Diagnosis present

## 2023-10-07 NOTE — Patient Instructions (Signed)
  Strengthening w/  band     PNF over door    PNF under door    Mini squat with band    Locust with band ( prone on the floor)    Strengthening     heel press mini squat "ballet"    Step up/ down on squatty potty    Deep core level 1-2 ( one floor)    Stretches     Happy baby   ( floor)    Mermaid   ( floor)    Strengthening feet arches:   _________  Step ups on squatty potty other way around or a low stool , away from wall,  hands on the wall   Step up L up, R up, L down, R down, exhale up and down, lower heel slow and controlled, shoulder slightly forward, center of mass moves after ballmounds contacts  10 x 3 reps each  _________

## 2023-10-07 NOTE — Therapy (Signed)
OUTPATIENT PHYSICAL THERAPY TREATMENT / Recert   Patient Name: Lisa Brady MRN: [id number] DOB:12/20/1985, 37 y.o., female Today's Date: 10/07/2023  PT End of Session - 10/07/23 0958     Visit Number 15    Number of Visits 24    Date for PT Re-Evaluation 12/09/23    PT Start Time 0940    PT Stop Time 1030    PT Time Calculation (min) 50 min    Activity Tolerance Patient tolerated treatment well;No increased pain    Behavior During Therapy [facility] for tasks assessed/performed               Past Medical History:  Diagnosis Date   Choroid plexus cyst of fetus on prenatal ultrasound 05/29/2021   Dysplastic nevus 04/01/2021   right medial forearm, mod Atypia    Dysplastic nevus 04/01/2021   right inferior axilla, mild atypia    Dysplastic nevus 04/01/2021   right superior medial scapula, mild atypia    Dysplastic nevus 05/05/2022   Right mid med scapula - moderate   Dysplastic nevus 05/05/2022   Right mid to sup buttock - moderate   Dysplastic nevus 05/04/2023   right mid med scapula- recurrent   Foot drop    History of left foot drop 08/17/2019   History of vacuum extraction assisted delivery 04/03/2021   Hypermobility arthralgia    Maternal varicella, non-immune 11/20/2018   Scoliosis    Urinary incontinence    Wears glasses    Past Surgical History:  Procedure Laterality Date   WISDOM TOOTH EXTRACTION  2006   Patient Active Problem List   Diagnosis Date Noted   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   Eyelid dermatitis, allergic/contact 02/11/2023   Hypermobility arthralgia 02/25/2021   Chronic pain of right knee 09/17/2020   Urinary incontinence 08/17/2019    PCP: Chestine Spore   REFERRING PROVIDER: Alfonse Alpers  REFERRING DIAG: Mixed stress and urge urinary incontinence   Rationale for Evaluation and Treatment Rehabilitation  THERAPY DIAG:  Other muscle spasm  Sacrococcygeal disorders, not elsewhere classified  Other lack of coordination  ONSET DATE:    SUBJECTIVE:           SUBJECTIVE STATEMENT:                                                     Pt reported she has noticed she has not had leakage across the past 2 weeks. Urgency is better   SUBJECTIVE STATEMENT ON EVAL 04/27/23 : Pt had urethral bulking one year ago. It helped with her incontinence which is why she did not keep up with her PT exercises. But the Tx did not help with prolapse.  1) prolapse Sx:  Pt had a long lapse between Pelvic PT.  Pt notices more pressure sensation and more urgency when she is constipation. Denied recurrent UTI. Pt is able to empty urine ok, when she is more constipated , she feels she does have more difficulty emptying completely.   2)  Constipation:  Pt noticed when she is constipated, she notices more prolapse and urgency.  If she is good about taking Miralax, pt has BMs daily and does not strain. When she remembers, she takes Miralax 3-4 x week. Without Miralax, pt has stool consistency Type 2-3.   With Miralax, consistency is a Type 4.   Daily water intake: 70  oz a day water when working,  60 fl oz of water when she not working, Pt is trying to add more fiber.   Pt had constipation issues on and off for a long time. Pt had 2 children with 2nd degree perineal scars.   3) SUI: pt has leakage with laughing, coughing, sneezing. Pt empties frequently just in case before leaving to activities. Pt avoids lots of activities with fear of leaking, with kids activities.   4) intermittent LBP :   when lying on her back at night in bed , 4/10 pain,central location , nonradiating.   5) L knee pain with sitting for 10 min to 1 hour : located at the back for knee which causes her pain when she stands up after sitting .  Pain 4-5/10 .  Resolves quickly.   6) R shoulder tightness: with ER/ IR   PERTINENT HISTORY:  Pt had 2 children with 2nd degree perineal scars. Hx of Hypermobility . 2nd child weighs 25 lbs and she is needing to carried by pt. Pt has tried kegel  exercise sbut she felt dyscoordinated at her abs.  Pt currently wears a shoe lift in her R shoe.  PAIN:  Are you having pain? Yes:   PRECAUTIONS: None  WEIGHT BEARING RESTRICTIONS: No  FALLS:  Has patient fallen in last 6 months? No  LIVING ENVIRONMENT: Lives with: lives with their family Lives in: House/apartment Stairs: 2 story    OCCUPATION: physician   PLOF: Independent  PATIENT GOALS:  Try to commit to 15 min to lunch break to PT HEP and while kids are napping,  and to keep accountability for Pelvic PT HEP   Pt would like to not pee on herself and avoid prolapse surgery  Not leak with swimming    OBJECTIVE:     [facility] PT Assessment - 10/07/23 1031       Observation/Other Assessments   Observations good carry over with last week's HEP      Step Up   Comments less adducted knees      Step Down   Comments poor eccentric control              [facility] Adult PT Treatment/Exercise - 10/07/23 1038       Therapeutic Activites    Other Therapeutic Activities strategized with pt to further compliance with HEP, explaining her strenghteing phase will help her achieve her goals,  created a chart for pt to ensure time with her schedule to maintain      Neuro Re-ed    Neuro Re-ed Details  cued for eccentric control of heel to strengthen arch and propioception of feet to maintain heel and knee alignment and minimize medial arch               HOME EXERCISE PROGRAM: See pt instruction section    ASSESSMENT:  CLINICAL IMPRESSION  Milestone improvement:                                                     Pt reported she has noticed she has not had leakage across the past 2 weeks. Urgency is better   Today,  Strategized with pt to further compliance with HEP, explaining her strengthening phase will help her achieve her goals,  created a chart for pt to ensure time with her  schedule to maintain.  Cued for eccentric control of heel to strengthen arch and  propioception of feet to maintain heel and knee alignment and minimize medial arch  Progressed to feet arch strengthening today as a way to progress toward pylometrics and jumping on trampoline without leakage which is one of her goals   Plan on progressing to long holds  pelvic floor contractions  next session.  Pt will continue to benefit form skilled PT to gain more  mobility, awareness, propioception of pelvic floor for continence and more [facility] training to integrate to fitness movements and play with children.  Pt benefits from skilled PT.    OBJECTIVE IMPAIRMENTS decreased activity tolerance, decreased coordination, decreased endurance, decreased mobility, difficulty walking, decreased ROM, decreased strength, decreased safety awareness, hypomobility, increased muscle spasms, impaired flexibility, improper body mechanics, postural dysfunction, and pain. scar restrictions   ACTIVITY LIMITATIONS  self-care,  sleep, home chores, work tasks    PARTICIPATION LIMITATIONS:  community, gym activities    PERSONAL FACTORS     are also affecting patient's functional outcome.    REHAB POTENTIAL: Good   CLINICAL DECISION MAKING: Evolving/moderate complexity   EVALUATION COMPLEXITY: Moderate    PATIENT EDUCATION:    Education details: Showed pt anatomy images. Explained muscles attachments/ connection, physiology of deep core system/ spinal- thoracic-pelvis-lower kinetic chain as they relate to pt's presentation, Sx, and past Hx. Explained what and how these areas of deficits need to be restored to balance and function    See Therapeutic activity / neuromuscular re-education section  Answered pt's questions.   Person educated: Patient Education method: Explanation, Demonstration, Tactile cues, Verbal cues, and Handouts Education comprehension: verbalized understanding, returned demonstration, verbal cues required, tactile cues required, and needs further education     PLAN: PT FREQUENCY:  1x/week   PT DURATION: 10 weeks   PLANNED INTERVENTIONS: Therapeutic exercises, Therapeutic activity, Neuromuscular re-education, Balance training, Gait training, Patient/Family education, Self Care, Joint mobilization, Spinal mobilization, Moist heat, Taping, and Manual therapy, dry needling.   PLAN FOR NEXT SESSION: See clinical impression for plan     GOALS: Goals reviewed with patient? Yes  SHORT TERM GOALS: Target date: 05/26/2023  Pt will demo IND with HEP                    Baseline: Not IND            Goal status: MET    LONG TERM GOALS: Target date: 12/09/2023     1.Pt will demo proper deep core coordination without chest breathing and optimal excursion of diaphragm/pelvic floor in order to promote spinal stability and pelvic floor function   Baseline: dyscoordination Goal status: MET   2.  Pt will demo > 5 pt change on FOTO  to improve QOL and function  PFDI Urinary baseline - 25  -> 21  Lower score = better function  Urinary Problem baseline- 52  --> 56  Higher score = better function  Prolapse-4  -> 17 ( still need to address 07/20/23 )  lower score = better function  Lumber baseline  - 83  -> 94    MET 07/20/23  Higher score = better function   Goal status: ongoing   3.  Pt will demo proper body mechanics in against gravity tasks and ADLs  work tasks, fitness  to minimize straining pelvic floor / back                  Baseline: not IND,  improper form that places strain on pelvic floor                Goal status:  Ongoing    4. Pt will demo decreased separation of diastasis to < 2 fingers width in order to promote stronger intraabdominal pressure system for spinal / postural stability and pelvic floor function  Baseline: 3 fingers width before head lift, 1 fingers width after  Goal status:  MET 07/20/23  ( no separation)   5. Pt will demo increased strength for R LE and B hip abd to > 4/5 to promote postural stability and less leakage Baseline: RLE 4/5,  L 5/5 , R hip ab 3/5  Goal status:  MET ( 07/20/23 )                           6. Pt will report Stool type 4 when she does not take Miralax in order to minimize straining and worsening prolapse.                         Baseline: When she remembers, she takes Miralax 3-4 x week. Without Miralax, pt has stool consistency Type 2-3.   With Miralax, consistency is a Type 4.                           Goal Status: Ongoing     7. Pt will demo proper upward movement of pelvic floor prior to simulated cough in order to improve SUI                         Baseline: not able to minimize SUI                         Goal Status:  MET                          8. Pt will be IND with scoliosis specific HEP to minimize R shoulder pain with ER/IR and L knee pain  and to maintain levelled alignment of pelvis and spine                         Baseline:  Shoulder pain with ER/IR ,  L knee pain with sitting for 10 min to 1 hour :                        Goal Status:  MET     9. Pt will be IND with deep core level 1-2 HEP to minimize DRA, LBP and promote continence Baseline:intermittent LBP :   when lying on her back at night in bed , 4/10 pain,  Pt avoids lots of activities with fear of leaking, with kids activities.  Goal Status:  MET  Pt had no LBP when lying on her back at night for the post month   10. Pt will feel comfortable engaging in activities with kids with less fear of leakage , jumping on trampoline, dancing, without needing to go the bathroom  Baseline: avoids jumping on trampoline Goal Status: Ongoing    Mariane Masters, PT 10/07/2023, 10:48 AM

## 2023-10-13 ENCOUNTER — Ambulatory Visit: Payer: BC Managed Care – PPO | Admitting: Physical Therapy

## 2023-10-13 DIAGNOSIS — M21372 Foot drop, left foot: Secondary | ICD-10-CM

## 2023-10-13 DIAGNOSIS — R278 Other lack of coordination: Secondary | ICD-10-CM

## 2023-10-13 DIAGNOSIS — M62838 Other muscle spasm: Secondary | ICD-10-CM

## 2023-10-13 DIAGNOSIS — M533 Sacrococcygeal disorders, not elsewhere classified: Secondary | ICD-10-CM | POA: Diagnosis not present

## 2023-10-13 NOTE — Patient Instructions (Signed)
   Clam Shell 45 Degrees  Lying with hips and knees bent 45, one pillow between knees and ankles. Heel together, toes apart like ballerina,  Lift knee with exhale while pressing heels together. Be sure pelvis does not roll backward. Do not arch back. Do R , only  , 2 times per day.    Complimentary stretch:    Sit at 45 deg turn with R leg and knee on edge of chair/ bench, L buttock hanging off the edge to bring the L foot back like a lunge, toes bent, lower heel to feel quad stretch,  pay attention to keeping pinky and first toe ballmound planted to align toes forward so ankles are not twerked   Repeat with other side   ___

## 2023-10-13 NOTE — Therapy (Signed)
OUTPATIENT PHYSICAL THERAPY TREATMENT  Patient Name: Lisa Brady MRN: [id number] DOB:1986/07/12, 37 y.o., female Today's Date: 10/13/2023  PT End of Session - 10/13/23 1510     Visit Number 16    Number of Visits 24    Date for PT Re-Evaluation 12/09/23    PT Start Time 1507    PT Stop Time 1550    PT Time Calculation (min) 43 min    Activity Tolerance Patient tolerated treatment well;No increased pain    Behavior During Therapy [facility] for tasks assessed/performed               Past Medical History:  Diagnosis Date   Choroid plexus cyst of fetus on prenatal ultrasound 05/29/2021   Dysplastic nevus 04/01/2021   right medial forearm, mod Atypia    Dysplastic nevus 04/01/2021   right inferior axilla, mild atypia    Dysplastic nevus 04/01/2021   right superior medial scapula, mild atypia    Dysplastic nevus 05/05/2022   Right mid med scapula - moderate   Dysplastic nevus 05/05/2022   Right mid to sup buttock - moderate   Dysplastic nevus 05/04/2023   right mid med scapula- recurrent   Foot drop    History of left foot drop 08/17/2019   History of vacuum extraction assisted delivery 04/03/2021   Hypermobility arthralgia    Maternal varicella, non-immune 11/20/2018   Scoliosis    Urinary incontinence    Wears glasses    Past Surgical History:  Procedure Laterality Date   WISDOM TOOTH EXTRACTION  2006   Patient Active Problem List   Diagnosis Date Noted   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   Eyelid dermatitis, allergic/contact 02/11/2023   Hypermobility arthralgia 02/25/2021   Chronic pain of right knee 09/17/2020   Urinary incontinence 08/17/2019    PCP: Chestine Spore   REFERRING PROVIDER: Alfonse Alpers  REFERRING DIAG: Mixed stress and urge urinary incontinence   Rationale for Evaluation and Treatment Rehabilitation  THERAPY DIAG:  Other muscle spasm  Sacrococcygeal disorders, not elsewhere classified  Other lack of coordination  ONSET DATE:   SUBJECTIVE:            SUBJECTIVE STATEMENT:                                                     Pt reported she doubled her steps to 10K across 3 days compared other typical 4-6K steps. Pt was able to push stroller once with two kids , nature walk with family . Pt noticed R hip/ knee pain which caused her to walk to her car. It hurts now with certain steps. Back now is not bothering her as much.   Pt did her exercises across 40 min for 3 days in row when her husband watched kids.    Pt has leakage before making it to the toilet.    SUBJECTIVE STATEMENT ON EVAL 04/27/23 : Pt had urethral bulking one year ago. It helped with her incontinence which is why she did not keep up with her PT exercises. But the Tx did not help with prolapse.  1) prolapse Sx:  Pt had a long lapse between Pelvic PT.  Pt notices more pressure sensation and more urgency when she is constipation. Denied recurrent UTI. Pt is able to empty urine ok, when she is more constipated , she feels she does  have more difficulty emptying completely.   2)  Constipation:  Pt noticed when she is constipated, she notices more prolapse and urgency.  If she is good about taking Miralax, pt has BMs daily and does not strain. When she remembers, she takes Miralax 3-4 x week. Without Miralax, pt has stool consistency Type 2-3.   With Miralax, consistency is a Type 4.   Daily water intake: 70 oz a day water when working,  60 fl oz of water when she not working, Pt is trying to add more fiber.   Pt had constipation issues on and off for a long time. Pt had 2 children with 2nd degree perineal scars.   3) SUI: pt has leakage with laughing, coughing, sneezing. Pt empties frequently just in case before leaving to activities. Pt avoids lots of activities with fear of leaking, with kids activities.   4) intermittent LBP :   when lying on her back at night in bed , 4/10 pain,central location , nonradiating.   5) L knee pain with sitting for 10 min to 1 hour : located at  the back for knee which causes her pain when she stands up after sitting .  Pain 4-5/10 .  Resolves quickly.   6) R shoulder tightness: with ER/ IR   PERTINENT HISTORY:  Pt had 2 children with 2nd degree perineal scars. Hx of Hypermobility . 2nd child weighs 25 lbs and she is needing to carried by pt. Pt has tried kegel exercise sbut she felt dyscoordinated at her abs.  Pt currently wears a shoe lift in her R shoe.  PAIN:  Are you having pain? Yes:   PRECAUTIONS: None  WEIGHT BEARING RESTRICTIONS: No  FALLS:  Has patient fallen in last 6 months? No  LIVING ENVIRONMENT: Lives with: lives with their family Lives in: House/apartment Stairs: 2 story    OCCUPATION: physician   PLOF: Independent  PATIENT GOALS:  Try to commit to 15 min to lunch break to PT HEP and while kids are napping,  and to keep accountability for Pelvic PT HEP   Pt would like to not pee on herself and avoid prolapse surgery  Not leak with swimming    OBJECTIVE:    [facility] PT Assessment - 10/13/23 2329       Strength   Overall Strength Comments R hip abd 3/5, L 4/5      Palpation   SI assessment  levelled pelvis and shoulder    Palpation comment R SIJ hypomobile, tightness along glut med, IT band, adductor hallucis, foot R             [facility] Adult PT Treatment/Exercise - 10/13/23 2347       Therapeutic Activites    Other Therapeutic Activities reinforced compliance to HEP as pt increases from 5-6 K to 10K steps with family walks/ hikes on the weekends      Neuro Re-ed    Neuro Re-ed Details  cued for clam shell and stretches               HOME EXERCISE PROGRAM: See pt instruction section    ASSESSMENT:  CLINICAL IMPRESSION                                                  At last session, pt reported she has noticed she has  not had leakage across the past 2 weeks. Urgency was better. However, this week, pt experienced one episode of urge incontinence. Pt also increased from 5-6K to  10K steps across 3 days with family hikes and walks. Plan to add stretch routine to minimize pelvic floor overactivity  Addressed pt's R knee pain that occurred after this past weekend with her increased steps/ day. Pt showed pelvic stability intact but LKC and glut mm were tight which was addressed with manual Tx.   Discussed and reinforced continuation of her compliance to her HEP as pt enters more strengthening which will lead her to achieve goals with  trampoline jumping with less leakage and  decreased SUI/ urge incontinence.    Plan to access whether pelvic floor contractions long holds are appropriate  next session.  Pt will continue to benefit form skilled PT to gain more  mobility, awareness, propioception of pelvic floor for continence and more [facility] training to integrate to fitness movements and play with children.  Pt benefits from skilled PT.    OBJECTIVE IMPAIRMENTS decreased activity tolerance, decreased coordination, decreased endurance, decreased mobility, difficulty walking, decreased ROM, decreased strength, decreased safety awareness, hypomobility, increased muscle spasms, impaired flexibility, improper body mechanics, postural dysfunction, and pain. scar restrictions   ACTIVITY LIMITATIONS  self-care,  sleep, home chores, work tasks    PARTICIPATION LIMITATIONS:  community, gym activities    PERSONAL FACTORS     are also affecting patient's functional outcome.    REHAB POTENTIAL: Good   CLINICAL DECISION MAKING: Evolving/moderate complexity   EVALUATION COMPLEXITY: Moderate    PATIENT EDUCATION:    Education details: Showed pt anatomy images. Explained muscles attachments/ connection, physiology of deep core system/ spinal- thoracic-pelvis-lower kinetic chain as they relate to pt's presentation, Sx, and past Hx. Explained what and how these areas of deficits need to be restored to balance and function    See Therapeutic activity / neuromuscular re-education  section  Answered pt's questions.   Person educated: Patient Education method: Explanation, Demonstration, Tactile cues, Verbal cues, and Handouts Education comprehension: verbalized understanding, returned demonstration, verbal cues required, tactile cues required, and needs further education     PLAN: PT FREQUENCY: 1x/week   PT DURATION: 10 weeks   PLANNED INTERVENTIONS: Therapeutic exercises, Therapeutic activity, Neuromuscular re-education, Balance training, Gait training, Patient/Family education, Self Care, Joint mobilization, Spinal mobilization, Moist heat, Taping, and Manual therapy, dry needling.   PLAN FOR NEXT SESSION: See clinical impression for plan     GOALS: Goals reviewed with patient? Yes  SHORT TERM GOALS: Target date: 05/26/2023  Pt will demo IND with HEP                    Baseline: Not IND            Goal status: MET    LONG TERM GOALS: Target date: 12/09/2023     1.Pt will demo proper deep core coordination without chest breathing and optimal excursion of diaphragm/pelvic floor in order to promote spinal stability and pelvic floor function   Baseline: dyscoordination Goal status: MET   2.  Pt will demo > 5 pt change on FOTO  to improve QOL and function  PFDI Urinary baseline - 25  -> 21  Lower score = better function  Urinary Problem baseline- 52  --> 56  Higher score = better function  Prolapse-4  -> 17 ( still need to address 07/20/23 )  lower score = better function  Lumber baseline  -  83  -> 94    MET 07/20/23  Higher score = better function   Goal status: ongoing   3.  Pt will demo proper body mechanics in against gravity tasks and ADLs  work tasks, fitness  to minimize straining pelvic floor / back                  Baseline: not IND, improper form that places strain on pelvic floor                Goal status:  Ongoing    4. Pt will demo decreased separation of diastasis to < 2 fingers width in order to promote stronger  intraabdominal pressure system for spinal / postural stability and pelvic floor function  Baseline: 3 fingers width before head lift, 1 fingers width after  Goal status:  MET 07/20/23  ( no separation)   5. Pt will demo increased strength for R LE and B hip abd to > 4/5 to promote postural stability and less leakage Baseline: RLE 4/5, L 5/5 , R hip ab 3/5  Goal status:  MET ( 07/20/23 )                           6. Pt will report Stool type 4 when she does not take Miralax in order to minimize straining and worsening prolapse.                         Baseline: When she remembers, she takes Miralax 3-4 x week. Without Miralax, pt has stool consistency Type 2-3.   With Miralax, consistency is a Type 4.                           Goal Status: Ongoing     7. Pt will demo proper upward movement of pelvic floor prior to simulated cough in order to improve SUI                         Baseline: not able to minimize SUI                         Goal Status:  MET                          8. Pt will be IND with scoliosis specific HEP to minimize R shoulder pain with ER/IR and L knee pain  and to maintain levelled alignment of pelvis and spine                         Baseline:  Shoulder pain with ER/IR ,  L knee pain with sitting for 10 min to 1 hour :                        Goal Status:  MET     9. Pt will be IND with deep core level 1-2 HEP to minimize DRA, LBP and promote continence Baseline:intermittent LBP :   when lying on her back at night in bed , 4/10 pain,  Pt avoids lots of activities with fear of leaking, with kids activities.  Goal Status:  MET  Pt had no LBP when lying on her back at night for the post  month   10. Pt will feel comfortable engaging in activities with kids with less fear of leakage , jumping on trampoline, dancing, without needing to go the bathroom  Baseline: avoids jumping on trampoline Goal Status: Ongoing    Mariane Masters, PT 10/13/2023, 3:11 PM

## 2023-10-20 ENCOUNTER — Ambulatory Visit: Payer: BC Managed Care – PPO | Admitting: Physical Therapy

## 2023-10-20 DIAGNOSIS — M533 Sacrococcygeal disorders, not elsewhere classified: Secondary | ICD-10-CM | POA: Diagnosis not present

## 2023-10-20 DIAGNOSIS — M62838 Other muscle spasm: Secondary | ICD-10-CM

## 2023-10-20 NOTE — Therapy (Signed)
OUTPATIENT PHYSICAL THERAPY TREATMENT  Patient Name: Lisa Brady MRN: [id number] DOB:November 22, 1986, 37 y.o., female Today's Date: 10/20/2023  PT End of Session - 10/20/23 1022     Visit Number 17    Number of Visits 24    Date for PT Re-Evaluation 12/09/23    PT Start Time 1020    PT Stop Time 1105    PT Time Calculation (min) 45 min    Activity Tolerance Patient tolerated treatment well;No increased pain    Behavior During Therapy [facility] for tasks assessed/performed               Past Medical History:  Diagnosis Date   Choroid plexus cyst of fetus on prenatal ultrasound 05/29/2021   Dysplastic nevus 04/01/2021   right medial forearm, mod Atypia    Dysplastic nevus 04/01/2021   right inferior axilla, mild atypia    Dysplastic nevus 04/01/2021   right superior medial scapula, mild atypia    Dysplastic nevus 05/05/2022   Right mid med scapula - moderate   Dysplastic nevus 05/05/2022   Right mid to sup buttock - moderate   Dysplastic nevus 05/04/2023   right mid med scapula- recurrent   Foot drop    History of left foot drop 08/17/2019   History of vacuum extraction assisted delivery 04/03/2021   Hypermobility arthralgia    Maternal varicella, non-immune 11/20/2018   Scoliosis    Urinary incontinence    Wears glasses    Past Surgical History:  Procedure Laterality Date   WISDOM TOOTH EXTRACTION  2006   Patient Active Problem List   Diagnosis Date Noted   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   Eyelid dermatitis, allergic/contact 02/11/2023   Hypermobility arthralgia 02/25/2021   Chronic pain of right knee 09/17/2020   Urinary incontinence 08/17/2019    PCP: Chestine Spore   REFERRING PROVIDER: Alfonse Alpers  REFERRING DIAG: Mixed stress and urge urinary incontinence   Rationale for Evaluation and Treatment Rehabilitation  THERAPY DIAG:  Other muscle spasm  Sacrococcygeal disorders, not elsewhere classified  Other lack of coordination  ONSET DATE:   SUBJECTIVE:            SUBJECTIVE STATEMENT:                                                     Pt reported this past week, she noticed it take more effort to move her knees out , even with driving in her car. She noticed her knees want to dramatically go in. Pt has had noticed things in her knees.   Pt noticed more pressure vaginally and more urgency lately.  It is a little better today. Pt has had a cold and some cough.    SUBJECTIVE STATEMENT ON EVAL 04/27/23 : Pt had urethral bulking one year ago. It helped with her incontinence which is why she did not keep up with her PT exercises. But the Tx did not help with prolapse.  1) prolapse Sx:  Pt had a long lapse between Pelvic PT.  Pt notices more pressure sensation and more urgency when she is constipation. Denied recurrent UTI. Pt is able to empty urine ok, when she is more constipated , she feels she does have more difficulty emptying completely.   2)  Constipation:  Pt noticed when she is constipated, she notices more prolapse and urgency.  If  she is good about taking Miralax, pt has BMs daily and does not strain. When she remembers, she takes Miralax 3-4 x week. Without Miralax, pt has stool consistency Type 2-3.   With Miralax, consistency is a Type 4.   Daily water intake: 70 oz a day water when working,  60 fl oz of water when she not working, Pt is trying to add more fiber.   Pt had constipation issues on and off for a long time. Pt had 2 children with 2nd degree perineal scars.   3) SUI: pt has leakage with laughing, coughing, sneezing. Pt empties frequently just in case before leaving to activities. Pt avoids lots of activities with fear of leaking, with kids activities.   4) intermittent LBP :   when lying on her back at night in bed , 4/10 pain,central location , nonradiating.   5) L knee pain with sitting for 10 min to 1 hour : located at the back for knee which causes her pain when she stands up after sitting .  Pain 4-5/10 .  Resolves quickly.    6) R shoulder tightness: with ER/ IR   PERTINENT HISTORY:  Pt had 2 children with 2nd degree perineal scars. Hx of Hypermobility . 2nd child weighs 25 lbs and she is needing to carried by pt. Pt has tried kegel exercise sbut she felt dyscoordinated at her abs.  Pt currently wears a shoe lift in her R shoe.  PAIN:  Are you having pain? Yes:   PRECAUTIONS: None  WEIGHT BEARING RESTRICTIONS: No  FALLS:  Has patient fallen in last 6 months? No  LIVING ENVIRONMENT: Lives with: lives with their family Lives in: House/apartment Stairs: 2 story    OCCUPATION: physician   PLOF: Independent  PATIENT GOALS:  Try to commit to 15 min to lunch break to PT HEP and while kids are napping,  and to keep accountability for Pelvic PT HEP   Pt would like to not pee on herself and avoid prolapse surgery  Not leak with swimming    OBJECTIVE:     [facility] PT Assessment - 10/20/23 1028       Step Down   Comments adduction and crackling of R knee, slight adduction of Lknee      Strength   Overall Strength Comments R hip abd 4-/5, L 5/5    Strength Assessment Site --   L quad 4-/5, R 4+/5     Palpation   Palpation comment hypmobile R foot midjoints, lateral ankle mm attachments, tib fib hypmobile             [facility] Adult PT Treatment/Exercise - 10/20/23 1442       Therapeutic Activites    Other Therapeutic Activities assessed knee alignment with step up and down, gait assessment  , explained the role of LKC for minimizing knee pain and pelvic floor issues      Neuro Re-ed    Neuro Re-ed Details  cued for gait training, higher knees, ballmound landing and less heeling striking to minimize loading of knees      Manual Therapy   Internal Pelvic Floor STM/MWM , grade III PA/AP mob at areas noted in assessment to promote midfoot joints mobility and ER of tibia and femur , improve plantar fascia mobility R                  HOME EXERCISE PROGRAM: See pt instruction section     ASSESSMENT:  CLINICAL IMPRESSION  Addressed pt's R knee / LKC and hypmobile midfoot which was addressed with manual Tx.   Cued for gait training, higher knees, ballmound landing and less heeling striking to minimize loading of knees  Plan to add LKC alignment and propioception HEP to promote ER of femur. Tibia and DF/ EV.  Plan to access whether pelvic floor contractions long holds are appropriate  next session.  Pt will continue to benefit form skilled PT to gain more  mobility, awareness, propioception of pelvic floor for continence and more [facility] training to integrate to fitness movements and play with children.  Pt benefits from skilled PT.    OBJECTIVE IMPAIRMENTS decreased activity tolerance, decreased coordination, decreased endurance, decreased mobility, difficulty walking, decreased ROM, decreased strength, decreased safety awareness, hypomobility, increased muscle spasms, impaired flexibility, improper body mechanics, postural dysfunction, and pain. scar restrictions   ACTIVITY LIMITATIONS  self-care,  sleep, home chores, work tasks    PARTICIPATION LIMITATIONS:  community, gym activities    PERSONAL FACTORS     are also affecting patient's functional outcome.    REHAB POTENTIAL: Good   CLINICAL DECISION MAKING: Evolving/moderate complexity   EVALUATION COMPLEXITY: Moderate    PATIENT EDUCATION:    Education details: Showed pt anatomy images. Explained muscles attachments/ connection, physiology of deep core system/ spinal- thoracic-pelvis-lower kinetic chain as they relate to pt's presentation, Sx, and past Hx. Explained what and how these areas of deficits need to be restored to balance and function    See Therapeutic activity / neuromuscular re-education section  Answered pt's questions.   Person educated: Patient Education method: Explanation, Demonstration, Tactile cues, Verbal cues, and Handouts Education  comprehension: verbalized understanding, returned demonstration, verbal cues required, tactile cues required, and needs further education     PLAN: PT FREQUENCY: 1x/week   PT DURATION: 10 weeks   PLANNED INTERVENTIONS: Therapeutic exercises, Therapeutic activity, Neuromuscular re-education, Balance training, Gait training, Patient/Family education, Self Care, Joint mobilization, Spinal mobilization, Moist heat, Taping, and Manual therapy, dry needling.   PLAN FOR NEXT SESSION: See clinical impression for plan     GOALS: Goals reviewed with patient? Yes  SHORT TERM GOALS: Target date: 05/26/2023  Pt will demo IND with HEP                    Baseline: Not IND            Goal status: MET    LONG TERM GOALS: Target date: 12/09/2023     1.Pt will demo proper deep core coordination without chest breathing and optimal excursion of diaphragm/pelvic floor in order to promote spinal stability and pelvic floor function   Baseline: dyscoordination Goal status: MET   2.  Pt will demo > 5 pt change on FOTO  to improve QOL and function  PFDI Urinary baseline - 25  -> 21  Lower score = better function  Urinary Problem baseline- 52  --> 56  Higher score = better function  Prolapse-4  -> 17 ( still need to address 07/20/23 )  lower score = better function  Lumber baseline  - 83  -> 94    MET 07/20/23  Higher score = better function   Goal status: ongoing   3.  Pt will demo proper body mechanics in against gravity tasks and ADLs  work tasks, fitness  to minimize straining pelvic floor / back                  Baseline: not IND, improper form  that places strain on pelvic floor                Goal status:  Ongoing    4. Pt will demo decreased separation of diastasis to < 2 fingers width in order to promote stronger intraabdominal pressure system for spinal / postural stability and pelvic floor function  Baseline: 3 fingers width before head lift, 1 fingers width after  Goal status:   MET 07/20/23  ( no separation)   5. Pt will demo increased strength for R LE and B hip abd to > 4/5 to promote postural stability and less leakage Baseline: RLE 4/5, L 5/5 , R hip ab 3/5  Goal status:  MET ( 07/20/23 )                           6. Pt will report Stool type 4 when she does not take Miralax in order to minimize straining and worsening prolapse.                         Baseline: When she remembers, she takes Miralax 3-4 x week. Without Miralax, pt has stool consistency Type 2-3.   With Miralax, consistency is a Type 4.                           Goal Status: Ongoing     7. Pt will demo proper upward movement of pelvic floor prior to simulated cough in order to improve SUI                         Baseline: not able to minimize SUI                         Goal Status:  MET                          8. Pt will be IND with scoliosis specific HEP to minimize R shoulder pain with ER/IR and L knee pain  and to maintain levelled alignment of pelvis and spine                         Baseline:  Shoulder pain with ER/IR ,  L knee pain with sitting for 10 min to 1 hour :                        Goal Status:  MET     9. Pt will be IND with deep core level 1-2 HEP to minimize DRA, LBP and promote continence Baseline:intermittent LBP :   when lying on her back at night in bed , 4/10 pain,  Pt avoids lots of activities with fear of leaking, with kids activities.  Goal Status:  MET  Pt had no LBP when lying on her back at night for the post month   10. Pt will feel comfortable engaging in activities with kids with less fear of leakage , jumping on trampoline, dancing, without needing to go the bathroom  Baseline: avoids jumping on trampoline Goal Status: Ongoing    Mariane Masters, PT 10/20/2023, 10:22 AM

## 2023-10-20 NOTE — Patient Instructions (Signed)
Feet care :  Self -feet massage  ? ?Handshake : fingers between toes, moving ballmounds/toes back and forth several times while other hand anchors at arch. Do the same at the hind/mid foot.  ?Heel to toes upward to a letter Big Letter T strokes to spread ballmounds and toes, several times, pinch between webs of toes  ?Run finger tips along top of foot between long bones "comb between the bones"  ? ? ?Wiggle toes and spread them out when relaxing  ? ?

## 2023-10-27 ENCOUNTER — Ambulatory Visit: Payer: BC Managed Care – PPO | Admitting: Physical Therapy

## 2023-10-27 DIAGNOSIS — M533 Sacrococcygeal disorders, not elsewhere classified: Secondary | ICD-10-CM | POA: Diagnosis not present

## 2023-10-27 DIAGNOSIS — M21372 Foot drop, left foot: Secondary | ICD-10-CM

## 2023-10-27 DIAGNOSIS — M62838 Other muscle spasm: Secondary | ICD-10-CM

## 2023-10-27 DIAGNOSIS — R278 Other lack of coordination: Secondary | ICD-10-CM

## 2023-10-27 NOTE — Patient Instructions (Signed)
Strengthening feet arches:    Heel raises - heels together, minisquat   Feet slides :   Points of contact at sitting bones  Four points of contact of foot,  Side knee back while keeping knee out along 2-3rd toe line   Heel up, ankle not twist out Lower heel while keeping knee out along 2-3rd toe line Four points of contact of foot, Slide foot back while keeping knee out along 2-3rd toe line   Repeated with other foot    __

## 2023-10-27 NOTE — Therapy (Signed)
OUTPATIENT PHYSICAL THERAPY TREATMENT  Patient Name: Lisa Brady MRN: [id number] DOB:05-30-1986, 37 y.o., female Today's Date: 10/27/2023  PT End of Session - 10/27/23 1424     Visit Number 18    Number of Visits 24    Date for PT Re-Evaluation 12/09/23    PT Start Time 1420    PT Stop Time 1500    PT Time Calculation (min) 40 min    Activity Tolerance Patient tolerated treatment well;No increased pain    Behavior During Therapy [facility] for tasks assessed/performed               Past Medical History:  Diagnosis Date   Choroid plexus cyst of fetus on prenatal ultrasound 05/29/2021   Dysplastic nevus 04/01/2021   right medial forearm, mod Atypia    Dysplastic nevus 04/01/2021   right inferior axilla, mild atypia    Dysplastic nevus 04/01/2021   right superior medial scapula, mild atypia    Dysplastic nevus 05/05/2022   Right mid med scapula - moderate   Dysplastic nevus 05/05/2022   Right mid to sup buttock - moderate   Dysplastic nevus 05/04/2023   right mid med scapula- recurrent   Foot drop    History of left foot drop 08/17/2019   History of vacuum extraction assisted delivery 04/03/2021   Hypermobility arthralgia    Maternal varicella, non-immune 11/20/2018   Scoliosis    Urinary incontinence    Wears glasses    Past Surgical History:  Procedure Laterality Date   WISDOM TOOTH EXTRACTION  2006   Patient Active Problem List   Diagnosis Date Noted   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   Eyelid dermatitis, allergic/contact 02/11/2023   Hypermobility arthralgia 02/25/2021   Chronic pain of right knee 09/17/2020   Urinary incontinence 08/17/2019    PCP: Chestine Spore   REFERRING PROVIDER: Alfonse Alpers  REFERRING DIAG: Mixed stress and urge urinary incontinence   Rationale for Evaluation and Treatment Rehabilitation  THERAPY DIAG:  Other muscle spasm  Sacrococcygeal disorders, not elsewhere classified  Other lack of coordination  ONSET DATE:   SUBJECTIVE:            SUBJECTIVE STATEMENT:                                                     Pt reported she has an IUD and she has spotting sporadically. When she is spotting, she notices more prolapse Sx.  Pt was doing the step up exercise , she noticed more separation of vulva but not prolapse.   Knee pain is better from last session   SUBJECTIVE STATEMENT ON EVAL 04/27/23 : Pt had urethral bulking one year ago. It helped with her incontinence which is why she did not keep up with her PT exercises. But the Tx did not help with prolapse.  1) prolapse Sx:  Pt had a long lapse between Pelvic PT.  Pt notices more pressure sensation and more urgency when she is constipation. Denied recurrent UTI. Pt is able to empty urine ok, when she is more constipated , she feels she does have more difficulty emptying completely.   2)  Constipation:  Pt noticed when she is constipated, she notices more prolapse and urgency.  If she is good about taking Miralax, pt has BMs daily and does not strain. When she remembers, she takes Miralax  3-4 x week. Without Miralax, pt has stool consistency Type 2-3.   With Miralax, consistency is a Type 4.   Daily water intake: 70 oz a day water when working,  60 fl oz of water when she not working, Pt is trying to add more fiber.   Pt had constipation issues on and off for a long time. Pt had 2 children with 2nd degree perineal scars.   3) SUI: pt has leakage with laughing, coughing, sneezing. Pt empties frequently just in case before leaving to activities. Pt avoids lots of activities with fear of leaking, with kids activities.   4) intermittent LBP :   when lying on her back at night in bed , 4/10 pain,central location , nonradiating.   5) L knee pain with sitting for 10 min to 1 hour : located at the back for knee which causes her pain when she stands up after sitting .  Pain 4-5/10 .  Resolves quickly.   6) R shoulder tightness: with ER/ IR   PERTINENT HISTORY:  Pt had 2 children  with 2nd degree perineal scars. Hx of Hypermobility . 2nd child weighs 25 lbs and she is needing to carried by pt. Pt has tried kegel exercise sbut she felt dyscoordinated at her abs.  Pt currently wears a shoe lift in her R shoe.  PAIN:  Are you having pain? Yes:   PRECAUTIONS: None  WEIGHT BEARING RESTRICTIONS: No  FALLS:  Has patient fallen in last 6 months? No  LIVING ENVIRONMENT: Lives with: lives with their family Lives in: House/apartment Stairs: 2 story    OCCUPATION: physician   PLOF: Independent  PATIENT GOALS:  Try to commit to 15 min to lunch break to PT HEP and while kids are napping,  and to keep accountability for Pelvic PT HEP   Pt would like to not pee on herself and avoid prolapse surgery  Not leak with swimming    OBJECTIVE:     [facility] PT Assessment - 10/27/23 1444       Step Up   Comments less adducted knees      Step Down   Comments less adducted knees      Palpation   SI assessment  levelled pelvis and shoulder    Palpation comment hypmobile B  foot midjoints, lateral ankle mm attachments, tib fib hypmobile             [facility] Adult PT Treatment/Exercise - 10/27/23 1441       Therapeutic Activites    Other Therapeutic Activities discussed the progression with long hold pelvic floor next month, more pylometric for Jan month to advance toward trampoline with minimizing leakage with trampoline jumping   Discussed endurance kegel training for upcoming.      Neuro Re-ed    Neuro Re-ed Details  cued for knee alignment and eccentric control of PF and knee alignment ER B      Manual Therapy   Internal Pelvic Floor STM/MWM , grade III PA/AP mob at areas noted in assessment to promote midfoot joints mobility and ER of tibia and femur B                 HOME EXERCISE PROGRAM: See pt instruction section    ASSESSMENT:  CLINICAL IMPRESSION Knee pain has resolved except for descending stairs.  Assessment of Feet still showed  hypombility at midfoot joints and plantar fascia along with leg mm.  Adduction of knees up and down step is better but  LKC still needs more manual Tx to improve with stair navigation and hills for better alignment of knees, mobility of feet .    Discussed the progression with long hold pelvic floor next month, more pylometric for Jan month to advance toward trampoline with minimizing leakage with trampoline jumping  Discussed endurance kegel training for upcoming.  Pt will continue to benefit form skilled PT to gain more  mobility, awareness, propioception of pelvic floor for continence and more [facility] training to integrate to fitness movements and play with children.  Pt benefits from skilled PT.    OBJECTIVE IMPAIRMENTS decreased activity tolerance, decreased coordination, decreased endurance, decreased mobility, difficulty walking, decreased ROM, decreased strength, decreased safety awareness, hypomobility, increased muscle spasms, impaired flexibility, improper body mechanics, postural dysfunction, and pain. scar restrictions   ACTIVITY LIMITATIONS  self-care,  sleep, home chores, work tasks    PARTICIPATION LIMITATIONS:  community, gym activities    PERSONAL FACTORS     are also affecting patient's functional outcome.    REHAB POTENTIAL: Good   CLINICAL DECISION MAKING: Evolving/moderate complexity   EVALUATION COMPLEXITY: Moderate    PATIENT EDUCATION:    Education details: Showed pt anatomy images. Explained muscles attachments/ connection, physiology of deep core system/ spinal- thoracic-pelvis-lower kinetic chain as they relate to pt's presentation, Sx, and past Hx. Explained what and how these areas of deficits need to be restored to balance and function    See Therapeutic activity / neuromuscular re-education section  Answered pt's questions.   Person educated: Patient Education method: Explanation, Demonstration, Tactile cues, Verbal cues, and Handouts Education  comprehension: verbalized understanding, returned demonstration, verbal cues required, tactile cues required, and needs further education     PLAN: PT FREQUENCY: 1x/week   PT DURATION: 10 weeks   PLANNED INTERVENTIONS: Therapeutic exercises, Therapeutic activity, Neuromuscular re-education, Balance training, Gait training, Patient/Family education, Self Care, Joint mobilization, Spinal mobilization, Moist heat, Taping, and Manual therapy, dry needling.   PLAN FOR NEXT SESSION: See clinical impression for plan     GOALS: Goals reviewed with patient? Yes  SHORT TERM GOALS: Target date: 05/26/2023  Pt will demo IND with HEP                    Baseline: Not IND            Goal status: MET    LONG TERM GOALS: Target date: 12/09/2023     1.Pt will demo proper deep core coordination without chest breathing and optimal excursion of diaphragm/pelvic floor in order to promote spinal stability and pelvic floor function   Baseline: dyscoordination Goal status: MET   2.  Pt will demo > 5 pt change on FOTO  to improve QOL and function  PFDI Urinary baseline - 25  -> 21  Lower score = better function  Urinary Problem baseline- 52  --> 56  Higher score = better function  Prolapse-4  -> 17 ( still need to address 07/20/23 )  lower score = better function  Lumber baseline  - 83  -> 94    MET 07/20/23  Higher score = better function   Goal status: ongoing   3.  Pt will demo proper body mechanics in against gravity tasks and ADLs  work tasks, fitness  to minimize straining pelvic floor / back                  Baseline: not IND, improper form that places strain on pelvic floor  Goal status:  Ongoing    4. Pt will demo decreased separation of diastasis to < 2 fingers width in order to promote stronger intraabdominal pressure system for spinal / postural stability and pelvic floor function  Baseline: 3 fingers width before head lift, 1 fingers width after  Goal status:   MET 07/20/23  ( no separation)   5. Pt will demo increased strength for R LE and B hip abd to > 4/5 to promote postural stability and less leakage Baseline: RLE 4/5, L 5/5 , R hip ab 3/5  Goal status:  MET ( 07/20/23 )                           6. Pt will report Stool type 4 when she does not take Miralax in order to minimize straining and worsening prolapse.                         Baseline: When she remembers, she takes Miralax 3-4 x week. Without Miralax, pt has stool consistency Type 2-3.   With Miralax, consistency is a Type 4.                           Goal Status: Ongoing     7. Pt will demo proper upward movement of pelvic floor prior to simulated cough in order to improve SUI                         Baseline: not able to minimize SUI                         Goal Status:  MET                          8. Pt will be IND with scoliosis specific HEP to minimize R shoulder pain with ER/IR and L knee pain  and to maintain levelled alignment of pelvis and spine                         Baseline:  Shoulder pain with ER/IR ,  L knee pain with sitting for 10 min to 1 hour :                        Goal Status:  MET     9. Pt will be IND with deep core level 1-2 HEP to minimize DRA, LBP and promote continence Baseline:intermittent LBP :   when lying on her back at night in bed , 4/10 pain,  Pt avoids lots of activities with fear of leaking, with kids activities.  Goal Status:  MET  Pt had no LBP when lying on her back at night for the post month   10. Pt will feel comfortable engaging in activities with kids with less fear of leakage , jumping on trampoline, dancing, without needing to go the bathroom  Baseline: avoids jumping on trampoline Goal Status: Ongoing    Mariane Masters, PT 10/27/2023, 2:24 PM

## 2023-11-10 ENCOUNTER — Ambulatory Visit: Payer: BC Managed Care – PPO | Attending: Family | Admitting: Physical Therapy

## 2023-11-10 DIAGNOSIS — M21372 Foot drop, left foot: Secondary | ICD-10-CM | POA: Insufficient documentation

## 2023-11-10 DIAGNOSIS — M62838 Other muscle spasm: Secondary | ICD-10-CM | POA: Insufficient documentation

## 2023-11-10 DIAGNOSIS — R278 Other lack of coordination: Secondary | ICD-10-CM | POA: Diagnosis present

## 2023-11-10 DIAGNOSIS — M533 Sacrococcygeal disorders, not elsewhere classified: Secondary | ICD-10-CM | POA: Diagnosis present

## 2023-11-10 NOTE — Therapy (Addendum)
OUTPATIENT PHYSICAL THERAPY TREATMENT  Patient Name: Lisa Brady MRN: [id number] DOB:01-30-1986, 37 y.o., female Today's Date: 11/10/2023  PT End of Session - 11/10/23 0937     Visit Number 19    Number of Visits 24    Date for PT Re-Evaluation 12/09/23    PT Start Time 0932    PT Stop Time 1015    PT Time Calculation (min) 43 min    Activity Tolerance Patient tolerated treatment well;No increased pain    Behavior During Therapy [facility] for tasks assessed/performed               Past Medical History:  Diagnosis Date   Choroid plexus cyst of fetus on prenatal ultrasound 05/29/2021   Dysplastic nevus 04/01/2021   right medial forearm, mod Atypia    Dysplastic nevus 04/01/2021   right inferior axilla, mild atypia    Dysplastic nevus 04/01/2021   right superior medial scapula, mild atypia    Dysplastic nevus 05/05/2022   Right mid med scapula - moderate   Dysplastic nevus 05/05/2022   Right mid to sup buttock - moderate   Dysplastic nevus 05/04/2023   right mid med scapula- recurrent   Foot drop    History of left foot drop 08/17/2019   History of vacuum extraction assisted delivery 04/03/2021   Hypermobility arthralgia    Maternal varicella, non-immune 11/20/2018   Scoliosis    Urinary incontinence    Wears glasses    Past Surgical History:  Procedure Laterality Date   WISDOM TOOTH EXTRACTION  2006   Patient Active Problem List   Diagnosis Date Noted   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   Eyelid dermatitis, allergic/contact 02/11/2023   Hypermobility arthralgia 02/25/2021   Chronic pain of right knee 09/17/2020   Urinary incontinence 08/17/2019    PCP: Chestine Spore   REFERRING PROVIDER: Alfonse Alpers  REFERRING DIAG: Mixed stress and urge urinary incontinence   Rationale for Evaluation and Treatment Rehabilitation  THERAPY DIAG:  Other muscle spasm  Sacrococcygeal disorders, not elsewhere classified  Other lack of coordination  ONSET DATE:   SUBJECTIVE:            SUBJECTIVE STATEMENT:                                                     Pt reported she noticed her R shoulder has felt more hypermobile lately with the cross diagonal resistance band exercise.  If she lies on her L side, the R arm feels "floppy" . This shoulder was better with the past PT sessions and exercised but she feels it has regressed. She started to do more the exercises on there back instead.    Pt also had noticed a constant pain that started 3 weeks ago at a pinpoint spot inside iliac crest on R and across the past week, it has progressed over the thigh area and to the back of the iliac crest and into the abdomen. When she pressed a muscle low abdome, she noticed referred pain to the back.  Pain level 1 constant , sometimes 3-4 /10 when she is trying to go to bed with lying down and with walking around.   Pt noticed she was constipated 2 weeks ago and started taking Miralax. Pt is not sure which came first. Sometimes when she was massaging the area by the R  iliac rest, she can feel hard stool . Prior to taking Miralax, pt was having daily BMs.  Since taking Miralax every other day or a couple days in a row, pt had BMs with hard stools in the first days  of of Miralax. This morning she had stringy stools.  Pt has noticed she decreased her water intake from 100 fl oz per day to 64 fl oz per day when she was at work across the past 3 weeks.      Pt has not had a gynecologist annual check up since 8/ 2023.  Pt has not been of the age for colonoscopy and she denied blood in stool/ urine, dark stools.   Pt noticed when doing deep core HEP ,felt not as coordinated. Pt is not able to get comfort with R hip area due to the new pain. Pt noticed more prolapse across the past 3 weeks with doing squats.    R knee pain has improved since the last relapse 3 weeks ago.      SUBJECTIVE STATEMENT ON EVAL 04/27/23 : Pt had urethral bulking one year ago. It helped with her incontinence which is  why she did not keep up with her PT exercises. But the Tx did not help with prolapse.  1) prolapse Sx:  Pt had a long lapse between Pelvic PT.  Pt notices more pressure sensation and more urgency when she is constipation. Denied recurrent UTI. Pt is able to empty urine ok, when she is more constipated , she feels she does have more difficulty emptying completely.   2)  Constipation:  Pt noticed when she is constipated, she notices more prolapse and urgency.  If she is good about taking Miralax, pt has BMs daily and does not strain. When she remembers, she takes Miralax 3-4 x week. Without Miralax, pt has stool consistency Type 2-3.   With Miralax, consistency is a Type 4.   Daily water intake: 70 oz a day water when working,  60 fl oz of water when she not working, Pt is trying to add more fiber.   Pt had constipation issues on and off for a long time. Pt had 2 children with 2nd degree perineal scars.   3) SUI: pt has leakage with laughing, coughing, sneezing. Pt empties frequently just in case before leaving to activities. Pt avoids lots of activities with fear of leaking, with kids activities.   4) intermittent LBP :   when lying on her back at night in bed , 4/10 pain,central location , nonradiating.   5) L knee pain with sitting for 10 min to 1 hour : located at the back for knee which causes her pain when she stands up after sitting .  Pain 4-5/10 .  Resolves quickly.   6) R shoulder tightness: with ER/ IR   PERTINENT HISTORY:  Pt had 2 children with 2nd degree perineal scars. Hx of Hypermobility . 2nd child weighs 25 lbs and she is needing to carried by pt. Pt has tried kegel exercise sbut she felt dyscoordinated at her abs.  Pt currently wears a shoe lift in her R shoe.  PAIN:  Are you having pain? Yes:   PRECAUTIONS: None  WEIGHT BEARING RESTRICTIONS: No  FALLS:  Has patient fallen in last 6 months? No  LIVING ENVIRONMENT: Lives with: lives with their family Lives in:  House/apartment Stairs: 2 story    OCCUPATION: physician   PLOF: Independent  PATIENT GOALS:  Try to commit to 32  min to lunch break to PT HEP and while kids are napping,  and to keep accountability for Pelvic PT HEP   Pt would like to not pee on herself and avoid prolapse surgery  Not leak with swimming    OBJECTIVE:   [facility] PT Assessment - 11/10/23 0957       Observation/Other Assessments   Observations pain reported in hooklying : pt pointed R low back, side hip,      Strength   Overall Strength Comments R hip abd  4-/5, L hi abd 4-/5.  seated BLE 4/5,  DF/EV B 5/5      Palpation   SI assessment  levelled pelvis and shoulder    Palpation comment PA pressure over pubic symphysis caused R concordant pain and L pain w/ cue for ipsilateral hip flexion in supine 30 deg. Post Tx, pt reported no pain at R , but pain persisted on L groin / hip             [facility] Adult PT Treatment/Exercise - 11/10/23 1803       Therapeutic Activites    Other Therapeutic Activities active listening to pt's signs and Sx, assessed pt to rule in/out MSK orgin of pain. discussed completing food chart and being consistent with water intake to minimize constipation , continue to withhold in standing PNF band HEP due to R shoulder hypermobility      Manual Therapy   Manual therapy comments STM/MWM at L pubic symphysis mm attachments              HOME EXERCISE PROGRAM: See pt instruction section    ASSESSMENT:  CLINICAL IMPRESSION   Improvements:  Pt demo'd levelled pelvic alignment, increased R hip abduction strength   Today, addressed pt's report of R inguinal/ hip pain that started 3 weeks ago.   Provided active listening to pt's signs and Sx, assessed pt to rule in/out MSK orgin of pain.  Pressure applied to inguinal area R recreated concordant sign and also created L pain with pressure was applied at inguinal area.  Post Tx to L pubic symphysis area, R sided pain resolved when  performing hip flexion 30 deg , straight leg from supine with pressure applied at pubic symphysis. However, L inguinal area and hip area was with pain with pressure applied at inguinal area/ pubic symphysis. Plan to continue to assess this area at next session. Plan to refer for further medical work up if pain persists and MSK assessments and Tx have been exhausted and need to rule potential non-musculoskeletal causes of pain.    Discussed completing food chart and being consistent with water intake to minimize constipation.   Continue to withhold in standing PNF band HEP due to R shoulder hypermobility. Plan to assess R should issue next session.   Plan to progress with long hold pelvic floor next month, more pylometric for Jan month to advance toward trampoline with minimizing leakage with trampoline jumping  Discussed endurance kegel training for upcoming.  Pt will continue to benefit form skilled PT to gain more  mobility, awareness, propioception of pelvic floor for continence and more [facility] training to integrate to fitness movements and play with children.  Pt benefits from skilled PT.    OBJECTIVE IMPAIRMENTS decreased activity tolerance, decreased coordination, decreased endurance, decreased mobility, difficulty walking, decreased ROM, decreased strength, decreased safety awareness, hypomobility, increased muscle spasms, impaired flexibility, improper body mechanics, postural dysfunction, and pain. scar restrictions   ACTIVITY LIMITATIONS  self-care,  sleep,  home chores, work tasks    PARTICIPATION LIMITATIONS:  community, gym activities    PERSONAL FACTORS     are also affecting patient's functional outcome.    REHAB POTENTIAL: Good   CLINICAL DECISION MAKING: Evolving/moderate complexity   EVALUATION COMPLEXITY: Moderate    PATIENT EDUCATION:    Education details: Showed pt anatomy images. Explained muscles attachments/ connection, physiology of deep core system/ spinal-  thoracic-pelvis-lower kinetic chain as they relate to pt's presentation, Sx, and past Hx. Explained what and how these areas of deficits need to be restored to balance and function    See Therapeutic activity / neuromuscular re-education section  Answered pt's questions.   Person educated: Patient Education method: Explanation, Demonstration, Tactile cues, Verbal cues, and Handouts Education comprehension: verbalized understanding, returned demonstration, verbal cues required, tactile cues required, and needs further education     PLAN: PT FREQUENCY: 1x/week   PT DURATION: 10 weeks   PLANNED INTERVENTIONS: Therapeutic exercises, Therapeutic activity, Neuromuscular re-education, Balance training, Gait training, Patient/Family education, Self Care, Joint mobilization, Spinal mobilization, Moist heat, Taping, and Manual therapy, dry needling.   PLAN FOR NEXT SESSION: See clinical impression for plan     GOALS: Goals reviewed with patient? Yes  SHORT TERM GOALS: Target date: 05/26/2023  Pt will demo IND with HEP                    Baseline: Not IND            Goal status: MET    LONG TERM GOALS: Target date: 12/09/2023     1.Pt will demo proper deep core coordination without chest breathing and optimal excursion of diaphragm/pelvic floor in order to promote spinal stability and pelvic floor function   Baseline: dyscoordination Goal status: MET   2.  Pt will demo > 5 pt change on FOTO  to improve QOL and function  PFDI Urinary baseline - 25  -> 21  Lower score = better function  Urinary Problem baseline- 52  --> 56  Higher score = better function  Prolapse-4  -> 17 ( still need to address 07/20/23 )  lower score = better function  Lumber baseline  - 83  -> 94    MET 07/20/23  Higher score = better function   Goal status: ongoing   3.  Pt will demo proper body mechanics in against gravity tasks and ADLs  work tasks, fitness  to minimize straining pelvic floor / back                   Baseline: not IND, improper form that places strain on pelvic floor                Goal status:  Ongoing    4. Pt will demo decreased separation of diastasis to < 2 fingers width in order to promote stronger intraabdominal pressure system for spinal / postural stability and pelvic floor function  Baseline: 3 fingers width before head lift, 1 fingers width after  Goal status:  MET 07/20/23  ( no separation)   5. Pt will demo increased strength for R LE and B hip abd to > 4/5 to promote postural stability and less leakage Baseline: RLE 4/5, L 5/5 , R hip ab 3/5  Goal status:  MET ( 07/20/23 )                           6. Pt will  report Stool type 4 when she does not take Miralax in order to minimize straining and worsening prolapse.                         Baseline: When she remembers, she takes Miralax 3-4 x week. Without Miralax, pt has stool consistency Type 2-3.   With Miralax, consistency is a Type 4.                           Goal Status: Ongoing     7. Pt will demo proper upward movement of pelvic floor prior to simulated cough in order to improve SUI                         Baseline: not able to minimize SUI                         Goal Status:  MET                          8. Pt will be IND with scoliosis specific HEP to minimize R shoulder pain with ER/IR and L knee pain  and to maintain levelled alignment of pelvis and spine                         Baseline:  Shoulder pain with ER/IR ,  L knee pain with sitting for 10 min to 1 hour :                        Goal Status:  MET     9. Pt will be IND with deep core level 1-2 HEP to minimize DRA, LBP and promote continence Baseline:intermittent LBP :   when lying on her back at night in bed , 4/10 pain,  Pt avoids lots of activities with fear of leaking, with kids activities.  Goal Status:  MET  Pt had no LBP when lying on her back at night for the post month   10. Pt will feel comfortable engaging in activities with  kids with less fear of leakage , jumping on trampoline, dancing, without needing to go the bathroom  Baseline: avoids jumping on trampoline Goal Status: Ongoing    Mariane Masters, PT 11/10/2023, 9:37 AM

## 2023-11-10 NOTE — Patient Instructions (Signed)
Complete food charts and consist water intake  Withhold PNF band HEP

## 2023-11-24 ENCOUNTER — Ambulatory Visit: Payer: BC Managed Care – PPO | Admitting: Physical Therapy

## 2023-11-24 DIAGNOSIS — M533 Sacrococcygeal disorders, not elsewhere classified: Secondary | ICD-10-CM

## 2023-11-24 DIAGNOSIS — M62838 Other muscle spasm: Secondary | ICD-10-CM

## 2023-11-24 NOTE — Patient Instructions (Addendum)
  KEGEL PROGRAM  Exercise check off  All the exercises on your back in the morning and night    Mon Tue Wed Thurs Fri  Sat Sun   Before deep core level 1:  3 sec hold,  2 rest breath,  3  reps                           Before deep core level 2   3 sec hold,  2 rest breath,  3  reps              After  deep core level 2   3 sec hold,  2 rest breath,  3  reps          TOTAL of 6 sets of the endurance kegels  if you the above routine in the morning and night     During breakfast, lunch, dinner   SEATED    Quick pelvic floor contractions  3 reps               TOTAL of 3 sets of the quick kegels if you do them seated with meals  __   Principles with breathing coordination:  Inhale without pushing stomach, sense back expand, quiet breath, find your anteiror tilt , feet firm   With bands:  sequence, exhalation , feel the hug, then pull band

## 2023-11-24 NOTE — Therapy (Signed)
OUTPATIENT PHYSICAL THERAPY TREATMENT  Patient Name: Lisa Brady MRN: [id number] DOB:May 26, 1986, 37 y.o., female Today's Date: 11/24/2023   PT End of Session - 11/24/23 0944     Visit Number 20    Number of Visits 30    Date for PT Re-Evaluation 02/02/24    PT Start Time 0935    PT Stop Time 1020    PT Time Calculation (min) 45 min    Activity Tolerance Patient tolerated treatment well;No increased pain    Behavior During Therapy [facility] for tasks assessed/performed               Past Medical History:  Diagnosis Date   Choroid plexus cyst of fetus on prenatal ultrasound 05/29/2021   Dysplastic nevus 04/01/2021   right medial forearm, mod Atypia    Dysplastic nevus 04/01/2021   right inferior axilla, mild atypia    Dysplastic nevus 04/01/2021   right superior medial scapula, mild atypia    Dysplastic nevus 05/05/2022   Right mid med scapula - moderate   Dysplastic nevus 05/05/2022   Right mid to sup buttock - moderate   Dysplastic nevus 05/04/2023   right mid med scapula- recurrent   Foot drop    History of left foot drop 08/17/2019   History of vacuum extraction assisted delivery 04/03/2021   Hypermobility arthralgia    Maternal varicella, non-immune 11/20/2018   Scoliosis    Urinary incontinence    Wears glasses    Past Surgical History:  Procedure Laterality Date   WISDOM TOOTH EXTRACTION  2006   Patient Active Problem List   Diagnosis Date Noted   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   Eyelid dermatitis, allergic/contact 02/11/2023   Hypermobility arthralgia 02/25/2021   Chronic pain of right knee 09/17/2020   Urinary incontinence 08/17/2019    PCP: Chestine Spore   REFERRING PROVIDER: Alfonse Alpers  REFERRING DIAG: Mixed stress and urge urinary incontinence   Rationale for Evaluation and Treatment Rehabilitation  THERAPY DIAG:  Other muscle spasm  Sacrococcygeal disorders, not elsewhere classified  Other lack of coordination  ONSET DATE:   SUBJECTIVE:            SUBJECTIVE STATEMENT:                                                     Pt reported she noticed her R pubic and groin occurred with driving when moving her leg with changing gas / brake petal. The surrounding hip and back pain has resolved.   She is still maintaining good  bowel movements and regularity.   When she does the "W" exercise her R shoulder is not as mobile. Still not doing the PNF exercise. The hyper mobile  shoulder feels better since transitioning to ground ones.     Pt had leakage a little bit when she coughing, sneezing . She also crossing leg to prevent leakage.    SUBJECTIVE STATEMENT ON EVAL 04/27/23 : Pt had urethral bulking one year ago. It helped with her incontinence which is why she did not keep up with her PT exercises. But the Tx did not help with prolapse.  1) prolapse Sx:  Pt had a long lapse between Pelvic PT.  Pt notices more pressure sensation and more urgency when she is constipation. Denied recurrent UTI. Pt is able to empty urine ok, when she is  more constipated , she feels she does have more difficulty emptying completely.   2)  Constipation:  Pt noticed when she is constipated, she notices more prolapse and urgency.  If she is good about taking Miralax, pt has BMs daily and does not strain. When she remembers, she takes Miralax 3-4 x week. Without Miralax, pt has stool consistency Type 2-3.   With Miralax, consistency is a Type 4.   Daily water intake: 70 oz a day water when working,  60 fl oz of water when she not working, Pt is trying to add more fiber.   Pt had constipation issues on and off for a long time. Pt had 2 children with 2nd degree perineal scars.   3) SUI: pt has leakage with laughing, coughing, sneezing. Pt empties frequently just in case before leaving to activities. Pt avoids lots of activities with fear of leaking, with kids activities.   4) intermittent LBP :   when lying on her back at night in bed , 4/10 pain,central location  , nonradiating.   5) L knee pain with sitting for 10 min to 1 hour : located at the back for knee which causes her pain when she stands up after sitting .  Pain 4-5/10 .  Resolves quickly.   6) R shoulder tightness: with ER/ IR   PERTINENT HISTORY:  Pt had 2 children with 2nd degree perineal scars. Hx of Hypermobility . 2nd child weighs 25 lbs and she is needing to carried by pt. Pt has tried kegel exercise sbut she felt dyscoordinated at her abs.  Pt currently wears a shoe lift in her R shoe.  PAIN:  Are you having pain? Yes:   PRECAUTIONS: None  WEIGHT BEARING RESTRICTIONS: No  FALLS:  Has patient fallen in last 6 months? No  LIVING ENVIRONMENT: Lives with: lives with their family Lives in: House/apartment Stairs: 2 story    OCCUPATION: physician   PLOF: Independent  PATIENT GOALS:  Try to commit to 15 min to lunch break to PT HEP and while kids are napping,  and to keep accountability for Pelvic PT HEP   Pt would like to not pee on herself and avoid prolapse surgery  Not leak with swimming    OBJECTIVE:     [facility] PT Assessment - 11/24/23 1759       Observation/Other Assessments   Observations seated pelvic floor quick contraction: good breathing, 3 reps              Pelvic Floor Special Questions - 11/24/23 1758     Pelvic Floor Internal Exam pt consented verbally without contraindications    Exam Type Vaginal    Palpation less tightness of pelvic floor, 3 sec holds. 3 reps    Strength fair squeeze, definite lift    Biofeedback tactile cues for circumferential and sequential contraction              [facility] Adult PT Treatment/Exercise - 11/24/23 1801       Therapeutic Activites    Other Therapeutic Activities explained endurance kegels , proper breathing exhalation with resistance band exercise to not lower pelvic organs ,      Manual Therapy   Internal Pelvic Floor tactile cues internally for proper circumferentiaal, sequential contraction with  rest breaths, for long contractions               HOME EXERCISE PROGRAM: See pt instruction section    ASSESSMENT:  CLINICAL IMPRESSION   Progressed with long  hold pelvic floor  which pt required cues for more sequential and circumferential coordination.    Discussed endurance kegel training and progressing quick contractions to seated position which  demo'd proper technique but low reps.    Anticipate kegel program will further help pt improve leakage.  Pt reported she noticed her R pubic and groin occurred with driving when moving her leg with changing gas / brake petal. The surrounding hip and back pain has resolved.  Plan to reassess these areas.   She is still maintaining good  bowel movements and regularity.     Pt benefits from skilled PT.    OBJECTIVE IMPAIRMENTS decreased activity tolerance, decreased coordination, decreased endurance, decreased mobility, difficulty walking, decreased ROM, decreased strength, decreased safety awareness, hypomobility, increased muscle spasms, impaired flexibility, improper body mechanics, postural dysfunction, and pain. scar restrictions   ACTIVITY LIMITATIONS  self-care,  sleep, home chores, work tasks    PARTICIPATION LIMITATIONS:  community, gym activities    PERSONAL FACTORS     are also affecting patient's functional outcome.    REHAB POTENTIAL: Good   CLINICAL DECISION MAKING: Evolving/moderate complexity   EVALUATION COMPLEXITY: Moderate    PATIENT EDUCATION:    Education details: Showed pt anatomy images. Explained muscles attachments/ connection, physiology of deep core system/ spinal- thoracic-pelvis-lower kinetic chain as they relate to pt's presentation, Sx, and past Hx. Explained what and how these areas of deficits need to be restored to balance and function    See Therapeutic activity / neuromuscular re-education section  Answered pt's questions.   Person educated: Patient Education method: Explanation,  Demonstration, Tactile cues, Verbal cues, and Handouts Education comprehension: verbalized understanding, returned demonstration, verbal cues required, tactile cues required, and needs further education     PLAN: PT FREQUENCY: 1x/week   PT DURATION: 10 weeks   PLANNED INTERVENTIONS: Therapeutic exercises, Therapeutic activity, Neuromuscular re-education, Balance training, Gait training, Patient/Family education, Self Care, Joint mobilization, Spinal mobilization, Moist heat, Taping, and Manual therapy, dry needling.   PLAN FOR NEXT SESSION: See clinical impression for plan     GOALS: Goals reviewed with patient? Yes  SHORT TERM GOALS: Target date: 05/26/2023  Pt will demo IND with HEP                    Baseline: Not IND            Goal status: MET    LONG TERM GOALS: Target date: 12/09/2023     1.Pt will demo proper deep core coordination without chest breathing and optimal excursion of diaphragm/pelvic floor in order to promote spinal stability and pelvic floor function   Baseline: dyscoordination Goal status: MET   2.  Pt will demo > 5 pt change on FOTO  to improve QOL and function  PFDI Urinary baseline - 25  -> 21  Lower score = better function  Urinary Problem baseline- 52  --> 56  Higher score = better function  Prolapse-4  -> 17 ( still need to address 07/20/23 )  lower score = better function  Lumber baseline  - 83  -> 94    MET 07/20/23  Higher score = better function   Goal status: ongoing   3.  Pt will demo proper body mechanics in against gravity tasks and ADLs  work tasks, fitness  to minimize straining pelvic floor / back                  Baseline: not IND, improper form that  places strain on pelvic floor                Goal status:  Ongoing    4. Pt will demo decreased separation of diastasis to < 2 fingers width in order to promote stronger intraabdominal pressure system for spinal / postural stability and pelvic floor function  Baseline: 3  fingers width before head lift, 1 fingers width after  Goal status:  MET 07/20/23  ( no separation)   5. Pt will demo increased strength for R LE and B hip abd to > 4/5 to promote postural stability and less leakage Baseline: RLE 4/5, L 5/5 , R hip ab 3/5  Goal status:  MET ( 07/20/23 )                           6. Pt will report Stool type 4 when she does not take Miralax in order to minimize straining and worsening prolapse.                         Baseline: When she remembers, she takes Miralax 3-4 x week. Without Miralax, pt has stool consistency Type 2-3.   With Miralax, consistency is a Type 4.                           Goal Status: Ongoing     7. Pt will demo proper upward movement of pelvic floor prior to simulated cough in order to improve SUI                         Baseline: not able to minimize SUI                         Goal Status:  MET                          8. Pt will be IND with scoliosis specific HEP to minimize R shoulder pain with ER/IR and L knee pain  and to maintain levelled alignment of pelvis and spine                         Baseline:  Shoulder pain with ER/IR ,  L knee pain with sitting for 10 min to 1 hour :                        Goal Status:  MET     9. Pt will be IND with deep core level 1-2 HEP to minimize DRA, LBP and promote continence Baseline:intermittent LBP :   when lying on her back at night in bed , 4/10 pain,  Pt avoids lots of activities with fear of leaking, with kids activities.  Goal Status:  MET  Pt had no LBP when lying on her back at night for the post month   10. Pt will feel comfortable engaging in activities with kids with less fear of leakage , jumping on trampoline, dancing, without needing to go the bathroom  Baseline: avoids jumping on trampoline Goal Status: Ongoing    Mariane Masters, PT 11/24/2023, 9:45 AM

## 2023-12-02 ENCOUNTER — Ambulatory Visit: Payer: BC Managed Care – PPO | Admitting: Physical Therapy

## 2023-12-02 DIAGNOSIS — M62838 Other muscle spasm: Secondary | ICD-10-CM

## 2023-12-02 DIAGNOSIS — M533 Sacrococcygeal disorders, not elsewhere classified: Secondary | ICD-10-CM

## 2023-12-02 NOTE — Patient Instructions (Addendum)
Dolphin squats on wall   Fingers interlaced, elbows shoulder width apart, forearms in a triangle pressing against the wall Mini squat position   Inhale, exhale chin tucked, shoulders lengthen down from ears, as you rise up not locking knees, hairline brushes by thumbs ( "like dolphin snout diving up our the water"  15 reps.  Make sure to not let the front ribs flare out, ribs over the pelvis aligned to not have a swayed back  Pressure through ballmounds to rise up, and dont lock the knees    __  Wear SIJ belt

## 2023-12-02 NOTE — Therapy (Signed)
OUTPATIENT PHYSICAL THERAPY TREATMENT  / Discharge Summary across 21 visits  Patient Name: Lisa Brady MRN: [id number] DOB:11/15/1986, 37 y.o., female Today's Date: 12/02/2023   PT End of Session - 12/02/23 0810     Visit Number 21    Number of Visits 24    Date for PT Re-Evaluation 12/09/23    PT Start Time 0803    PT Stop Time 0845    PT Time Calculation (min) 42 min    Activity Tolerance Patient tolerated treatment well;No increased pain    Behavior During Therapy [facility] for tasks assessed/performed               Past Medical History:  Diagnosis Date   Choroid plexus cyst of fetus on prenatal ultrasound 05/29/2021   Dysplastic nevus 04/01/2021   right medial forearm, mod Atypia    Dysplastic nevus 04/01/2021   right inferior axilla, mild atypia    Dysplastic nevus 04/01/2021   right superior medial scapula, mild atypia    Dysplastic nevus 05/05/2022   Right mid med scapula - moderate   Dysplastic nevus 05/05/2022   Right mid to sup buttock - moderate   Dysplastic nevus 05/04/2023   right mid med scapula- recurrent   Foot drop    History of left foot drop 08/17/2019   History of vacuum extraction assisted delivery 04/03/2021   Hypermobility arthralgia    Maternal varicella, non-immune 11/20/2018   Scoliosis    Urinary incontinence    Wears glasses    Past Surgical History:  Procedure Laterality Date   WISDOM TOOTH EXTRACTION  2006   Patient Active Problem List   Diagnosis Date Noted   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   Eyelid dermatitis, allergic/contact 02/11/2023   Hypermobility arthralgia 02/25/2021   Chronic pain of right knee 09/17/2020   Urinary incontinence 08/17/2019    PCP: Chestine Spore   REFERRING PROVIDER: Alfonse Alpers  REFERRING DIAG: Mixed stress and urge urinary incontinence   Rationale for Evaluation and Treatment Rehabilitation  THERAPY DIAG:  Other muscle spasm  Sacrococcygeal disorders, not elsewhere classified  Other lack of  coordination  ONSET DATE:   SUBJECTIVE:           SUBJECTIVE STATEMENT:                                                     Pt reported she noticed her R sharp pinpointed pain  at pubic and groin area still persists at level 6/10.  It is worst when she presses the ballmounds of her feet and when she presses on the spot.  It eases once she moves out of position that triggers. When it started, she was constipated but she has since become regular with daily bowel movement. LBP occurred yesterday with more repeated lifting of her dtr and Ibuprofen did not help.   Pt noticed there is a wrap around achey pain 4/10 to the thigh and glut which has occurred several times.   Pt has adjusted the tilt of pelvis when doing PT exercises in hooklying which has helped decrease the wrap around pain thigh and gluts but pinpointed pain still persist when she does the HEP. Once she is out of the hooklying position, the pinpointed pain is gone.   SUBJECTIVE STATEMENT ON EVAL 04/27/23 : Pt had urethral bulking one year ago. It helped with  her incontinence which is why she did not keep up with her PT exercises. But the Tx did not help with prolapse.  1) prolapse Sx:  Pt had a long lapse between Pelvic PT.  Pt notices more pressure sensation and more urgency when she is constipation. Denied recurrent UTI. Pt is able to empty urine ok, when she is more constipated , she feels she does have more difficulty emptying completely.   2)  Constipation:  Pt noticed when she is constipated, she notices more prolapse and urgency.  If she is good about taking Miralax, pt has BMs daily and does not strain. When she remembers, she takes Miralax 3-4 x week. Without Miralax, pt has stool consistency Type 2-3.   With Miralax, consistency is a Type 4.   Daily water intake: 70 oz a day water when working,  60 fl oz of water when she not working, Pt is trying to add more fiber.   Pt had constipation issues on and off for a long time. Pt  had 2 children with 2nd degree perineal scars.   3) SUI: pt has leakage with laughing, coughing, sneezing. Pt empties frequently just in case before leaving to activities. Pt avoids lots of activities with fear of leaking, with kids activities.   4) intermittent LBP :   when lying on her back at night in bed , 4/10 pain,central location , nonradiating.   5) L knee pain with sitting for 10 min to 1 hour : located at the back for knee which causes her pain when she stands up after sitting .  Pain 4-5/10 .  Resolves quickly.   6) R shoulder tightness: with ER/ IR   PERTINENT HISTORY:  Pt had 2 children with 2nd degree perineal scars. Hx of Hypermobility . 2nd child weighs 25 lbs and she is needing to carried by pt. Pt has tried kegel exercise sbut she felt dyscoordinated at her abs.  Pt currently wears a shoe lift in her R shoe.  PAIN:  Are you having pain? Yes:   PRECAUTIONS: None  WEIGHT BEARING RESTRICTIONS: No  FALLS:  Has patient fallen in last 6 months? No  LIVING ENVIRONMENT: Lives with: lives with their family Lives in: House/apartment Stairs: 2 story    OCCUPATION: physician   PLOF: Independent  PATIENT GOALS:  Try to commit to 15 min to lunch break to PT HEP and while kids are napping,  and to keep accountability for Pelvic PT HEP   Pt would like to not pee on herself and avoid prolapse surgery  Not leak with swimming    OBJECTIVE:     [facility] PT Assessment - 12/02/23 2156       Palpation   Palpation comment pelvic girdle manually applied stability with cue for hip flex 30 deg R relieved reproduced R pinpoint groin pain.   Without manually applied pelvic stability, R pinpoint groin pain was reproduced with R hip flex 30 deg, noted more pelvic pertubation compared L            Pelvic Floor Special Questions - 12/02/23 2155     Pelvic Floor Internal Exam pt consented verbally without contraindications    Exam Type Vaginal    Palpation no referred pain to  the pinpoint pain at R groin, activation of glut mm with cue for transverse arch activation/ knee alignment.    Strength fair squeeze, definite lift    Biofeedback good carry over with endurance kegel contractions , more upward position  of pelvic organs             [facility] Adult PT Treatment/Exercise - 12/02/23 2159       Therapeutic Activites    Other Therapeutic Activities reviewed don and doff of SIJ belt for pelvic stability, active listening to pt's pain patterns      Neuro Re-ed    Neuro Re-ed Details  cued for dolphin plank by wall , less lumbar lordosis,      Manual Therapy   Internal Pelvic Floor propioception training                 HOME EXERCISE PROGRAM: See pt instruction section    ASSESSMENT:  CLINICAL IMPRESSION   Pt showed good carry with long hold pelvic floor contractions as she required cues for more sequential and circumferential coordination.    Anticipate kegel program will further help pt improve leakage.  Reassessed R pubic and groin. Internal pelvic floor assessment did not reproduce pinpointed R groin pain.  Pinpointed groin pain was reproduced with R hip flexion from supine. Without manually applied pelvic stability, R pinpoint groin pain was reproduced with R hip flex 30 deg, noted more pelvic pertubation compared L.  Advised pt to wear her SIJ belt with loaded activities ( lifting dtr, hiking long hikes).  Added abdominal strengthening with dolphin plank by wall with scapular stabilization to HEP. Pt required cues for less lumbar lordosis with technique.  Advised wearing SIJ belt for pelvic stability and encouraged continuation of HEP for further strengthening.   Pt met 7 goals, partially met 2 goals and not met one goal. Pt has maintained aligned spine/ pelvis/ improved LKC alignment and strength. Pt is IND with proper coordination of deep core system and quick and long endurance contractions ( kegel contractions).   Pt's HEP was adjusted  and condensed to promote compliance with pt's busy schedule. Pt has maintained compliance and motivation which has helped pt to maintain structural alignment. Continuation with HEP will help with strengthening for longer term benefits.    Pt is self-charging due to change with insurance coverage.     OBJECTIVE IMPAIRMENTS decreased activity tolerance, decreased coordination, decreased endurance, decreased mobility, difficulty walking, decreased ROM, decreased strength, decreased safety awareness, hypomobility, increased muscle spasms, impaired flexibility, improper body mechanics, postural dysfunction, and pain. scar restrictions   ACTIVITY LIMITATIONS  self-care,  sleep, home chores, work tasks    PARTICIPATION LIMITATIONS:  community, gym activities    PERSONAL FACTORS     are also affecting patient's functional outcome.    REHAB POTENTIAL: Good   CLINICAL DECISION MAKING: Evolving/moderate complexity   EVALUATION COMPLEXITY: Moderate    PATIENT EDUCATION:    Education details: Showed pt anatomy images. Explained muscles attachments/ connection, physiology of deep core system/ spinal- thoracic-pelvis-lower kinetic chain as they relate to pt's presentation, Sx, and past Hx. Explained what and how these areas of deficits need to be restored to balance and function    See Therapeutic activity / neuromuscular re-education section  Answered pt's questions.   Person educated: Patient Education method: Explanation, Demonstration, Tactile cues, Verbal cues, and Handouts Education comprehension: verbalized understanding, returned demonstration, verbal cues required, tactile cues required, and needs further education     PLAN: PT FREQUENCY: 1x/week   PT DURATION: 10 weeks   PLANNED INTERVENTIONS: Therapeutic exercises, Therapeutic activity, Neuromuscular re-education, Balance training, Gait training, Patient/Family education, Self Care, Joint mobilization, Spinal mobilization, Moist  heat, Taping, and Manual therapy, dry needling.   PLAN FOR  NEXT SESSION: See clinical impression for plan     GOALS: Goals reviewed with patient? Yes  SHORT TERM GOALS: Target date: 05/26/2023  Pt will demo IND with HEP                    Baseline: Not IND            Goal status: MET    LONG TERM GOALS: Target date: 12/09/2023     1.Pt will demo proper deep core coordination without chest breathing and optimal excursion of diaphragm/pelvic floor in order to promote spinal stability and pelvic floor function   Baseline: dyscoordination Goal status: MET   2.  Pt will demo > 5 pt change on FOTO  to improve QOL and function  PFDI Urinary baseline - 25  -> 21  Lower score = better function  Urinary Problem baseline- 52  --> 56  Higher score = better function  Prolapse-4  -> 17 ( still need to address 07/20/23 )  lower score = better function  Lumber baseline  - 83  -> 94    MET 07/20/23  Higher score = better function   Goal status: partially met   3.  Pt will demo proper body mechanics in against gravity tasks and ADLs  work tasks, fitness  to minimize straining pelvic floor / back                  Baseline: not IND, improper form that places strain on pelvic floor                Goal status:  MET   4. Pt will demo decreased separation of diastasis to < 2 fingers width in order to promote stronger intraabdominal pressure system for spinal / postural stability and pelvic floor function  Baseline: 3 fingers width before head lift, 1 fingers width after  Goal status:  MET 07/20/23  ( no separation)   5. Pt will demo increased strength for R LE and B hip abd to > 4/5 to promote postural stability and less leakage Baseline: RLE 4/5, L 5/5 , R hip ab 3/5  Goal status:  MET ( 07/20/23 )                           6. Pt will report Stool type 4 when she does not take Miralax in order to minimize straining and worsening prolapse.                         Baseline: When she  remembers, she takes Miralax 3-4 x week. Without Miralax, pt has stool consistency Type 2-3.   With Miralax, consistency is a Type 4.                           Goal Status:  Not met, takes Miralax    7. Pt will demo proper upward movement of pelvic floor prior to simulated cough in order to improve SUI                         Baseline: not able to minimize SUI                         Goal Status:  MET  8. Pt will be IND with scoliosis specific HEP to minimize R shoulder pain with ER/IR and L knee pain  and to maintain levelled alignment of pelvis and spine                         Baseline:  Shoulder pain with ER/IR ,  L knee pain with sitting for 10 min to 1 hour :                        Goal Status:  MET     9. Pt will be IND with deep core level 1-2 HEP to minimize DRA, LBP and promote continence Baseline:intermittent LBP :   when lying on her back at night in bed , 4/10 pain,  Pt avoids lots of activities with fear of leaking, with kids activities.  Goal Status:  MET  Pt had no LBP when lying on her back at night for the post month   10. Pt will feel comfortable engaging in activities with kids with less fear of leakage , jumping on trampoline, dancing, without needing to go the bathroom  Baseline: avoids jumping on trampoline Goal Status: Not met    Mariane Masters, PT 12/02/2023, 8:11 AM

## 2023-12-08 ENCOUNTER — Ambulatory Visit: Payer: Self-pay | Admitting: Physical Therapy

## 2023-12-30 ENCOUNTER — Encounter: Payer: BC Managed Care – PPO | Admitting: Physical Therapy

## 2024-01-13 ENCOUNTER — Encounter: Payer: BC Managed Care – PPO | Admitting: Physical Therapy

## 2024-01-27 ENCOUNTER — Encounter: Payer: BC Managed Care – PPO | Admitting: Physical Therapy

## 2024-02-09 ENCOUNTER — Encounter: Payer: BC Managed Care – PPO | Admitting: Physical Therapy

## 2024-02-23 ENCOUNTER — Encounter: Payer: BC Managed Care – PPO | Admitting: Physical Therapy

## 2024-04-05 ENCOUNTER — Ambulatory Visit: Admitting: Family

## 2024-04-06 ENCOUNTER — Ambulatory Visit: Admitting: Primary Care

## 2024-04-06 VITALS — BP 112/80 | HR 79 | Temp 97.7°F | Ht 63.0 in | Wt 121.0 lb

## 2024-04-06 DIAGNOSIS — R5382 Chronic fatigue, unspecified: Secondary | ICD-10-CM | POA: Diagnosis not present

## 2024-04-06 DIAGNOSIS — H9311 Tinnitus, right ear: Secondary | ICD-10-CM

## 2024-04-06 DIAGNOSIS — H9319 Tinnitus, unspecified ear: Secondary | ICD-10-CM | POA: Insufficient documentation

## 2024-04-06 NOTE — Progress Notes (Signed)
 Subjective:    Patient ID: Lisa Brady, female    DOB: 1986-02-27, 38 y.o.   MRN: [id number]  HPI  Lisa Brady is a very pleasant 38 y.o. female with a history of urinary incontinence, permeability arthralgia who presents today to discuss multiple concerns.  1) Fatigue: Chronic for numerous months, maybe years.  Historically she has chalked up her fatigue to being a mom with young kids and working.  She does admit to going to bed later some days.  She typically goes to bed around 9-11 pm, wakes up between 5:30 am to 6 am. She does have a history of vitamin D deficiency. She's noticed her hair to be thinner than usual, also noticed dry skin. She questions if there is a metabolic cause to her fatigue.  She's active in therapy several times monthly, feels that this helps overall with postpartum symptoms.   2) Tinnitus: Acute onset 1-2 weeks ago to the right side. She describes her tinnitus as a "wooshing" sound that follows along with her pulse.  Her tinnitus is intermittent.    She denies changes in hearing, tinnitus to the left ear, dizziness, changes in vision.   She has mild allergy symptoms that are no worse than usual. She does not take an antihistamine regularly.  She has not tried a nasal spray.  Review of Systems  Constitutional:  Positive for fatigue.  HENT:  Positive for tinnitus. Negative for ear pain and rhinorrhea.   Genitourinary:  Negative for menstrual problem and vaginal bleeding.  Allergic/Immunologic: Positive for environmental allergies.  Psychiatric/Behavioral:  The patient is not nervous/anxious.          Past Medical History:  Diagnosis Date   Choroid plexus cyst of fetus on prenatal ultrasound 05/29/2021   Dysplastic nevus 04/01/2021   right medial forearm, mod Atypia    Dysplastic nevus 04/01/2021   right inferior axilla, mild atypia    Dysplastic nevus 04/01/2021   right superior medial scapula, mild atypia    Dysplastic nevus 05/05/2022    Right mid med scapula - moderate   Dysplastic nevus 05/05/2022   Right mid to sup buttock - moderate   Dysplastic nevus 05/04/2023   right mid med scapula- recurrent   Foot drop    History of left foot drop 08/17/2019   History of vacuum extraction assisted delivery 04/03/2021   Hypermobility arthralgia    Maternal varicella, non-immune 11/20/2018   Scoliosis    Urinary incontinence    Wears glasses     Social History   Socioeconomic History   Marital status: Married    Spouse name: Kirke Pepper   Number of children: 1   Years of education: Not on file   Highest education level: Professional school degree (e.g., MD, DDS, DVM, JD)  Occupational History   Not on file  Tobacco Use   Smoking status: Never   Smokeless tobacco: Never  Vaping Use   Vaping status: Never Used  Substance and Sexual Activity   Alcohol use: Yes    Alcohol/week: 1.0 standard drink of alcohol    Types: 1 Glasses of wine per week   Drug use: Never   Sexual activity: Yes    Birth control/protection: Pill  Other Topics Concern   Not on file  Social History Narrative   Ardeen Kohler born [birth year]   Social Drivers of Health   Financial Resource Strain: Low Risk  (04/06/2024)   Overall Financial Resource Strain (CARDIA)    Difficulty of Paying Living Expenses:  Not hard at all  Food Insecurity: No Food Insecurity (04/06/2024)   Hunger Vital Sign    Worried About Running Out of Food in the Last Year: Never true    Ran Out of Food in the Last Year: Never true  Transportation Needs: No Transportation Needs (04/06/2024)   PRAPARE - Administrator, Civil Service (Medical): No    Lack of Transportation (Non-Medical): No  Physical Activity: Insufficiently Active (04/06/2024)   Exercise Vital Sign    Days of Exercise per Week: 1 day    Minutes of Exercise per Session: 10 min  Stress: Stress Concern Present (04/06/2024)   [employer] of Occupational Health - Occupational Stress Questionnaire    Feeling of Stress :  To some extent  Social Connections: Socially Integrated (04/06/2024)   Social Connection and Isolation Panel [NHANES]    Frequency of Communication with Friends and Family: More than three times a week    Frequency of Social Gatherings with Friends and Family: More than three times a week    Attends Religious Services: More than 4 times per year    Active Member of [employer] or Organizations: Yes    Attends Engineer, structural: More than 4 times per year    Marital Status: Married  Catering manager Violence: Not on file    Past Surgical History:  Procedure Laterality Date   WISDOM TOOTH EXTRACTION  2006    Family History  Problem Relation Age of Onset   Breast cancer Mother 52   Melanoma Mother    Hyperlipidemia Father    Hypertension Father    Hyperlipidemia Brother    Brain cancer Maternal Grandmother    Heart disease Maternal Grandfather    Lung cancer Paternal Grandmother    Lung cancer Paternal Grandfather    Hyperlipidemia Brother    Hyperlipidemia Brother    Hyperlipidemia Brother     No Known Allergies  Current Outpatient Medications on File Prior to Visit  Medication Sig Dispense Refill   cetirizine (ZYRTEC) 10 MG tablet Take 10 mg by mouth at bedtime.     clobetasol  (TEMOVATE ) 0.05 % external solution Apply 1 Application topically 2 (two) times daily. 50 mL 0   ketoconazole  (NIZORAL ) 2 % cream Apply to eyelids once a day Mon, Wed, Fr 30 g 6   ketoconazole  (NIZORAL ) 2 % shampoo apply three times per week, massage into scalp and leave in for 10 minutes before rinsing out 120 mL 6   valACYclovir  (VALTREX ) 1000 MG tablet Take two tablets po bid for one dose prn outbreak 30 tablet 0   No current facility-administered medications on file prior to visit.    BP 112/80 (BP Location: Left Arm, Patient Position: Sitting, Cuff Size: Normal)   Pulse 79   Temp 97.7 F (36.5 C) (Oral)   Ht 5\' 3"  (1.6 m)   Wt 121 lb (54.9 kg)   SpO2 97%   BMI 21.43 kg/m   Objective:   Physical Exam HENT:     Right Ear: Tympanic membrane and ear canal normal. There is no impacted cerumen.     Left Ear: Tympanic membrane and ear canal normal. There is no impacted cerumen.  Cardiovascular:     Rate and Rhythm: Normal rate and regular rhythm.  Pulmonary:     Effort: Pulmonary effort is normal.     Breath sounds: Normal breath sounds.  Musculoskeletal:     Cervical back: Neck supple.  Skin:    General: Skin is  warm and dry.  Neurological:     Mental Status: She is alert and oriented to person, place, and time.  Psychiatric:        Mood and Affect: Mood normal.           Assessment & Plan:  Chronic fatigue Assessment & Plan: Unclear etiology.  Could be secondary to her general daily schedule versus metabolic cause.  Low suspicion for sleep apnea.  Depression seems to be managed well.  Checking labs today to evaluate for metabolic causes. Labs pending for TSH, CBC, ferritin, vitamin D, vitamin B12, CMP.  Await results.   Orders: -     Vitamin B12 -     VITAMIN D 25 Hydroxy (Vit-D Deficiency, Fractures) -     TSH -     Comprehensive metabolic panel with GFR -     CBC -     Ferritin  Tinnitus of right ear Assessment & Plan: Symptoms do seem similar to pulsatile tinnitus, however she is overall low risk.  HEENT exam today grossly benign. Recommended steroid nasal spray. She will resume her antihistamine regularly.  Labs pending today. Consider ENT referral.         Deb Loudin K Brace Welte, NP

## 2024-04-06 NOTE — Assessment & Plan Note (Signed)
 Unclear etiology.  Could be secondary to her general daily schedule versus metabolic cause.  Low suspicion for sleep apnea.  Depression seems to be managed well.  Checking labs today to evaluate for metabolic causes. Labs pending for TSH, CBC, ferritin, vitamin D, vitamin B12, CMP.  Await results.

## 2024-04-06 NOTE — Assessment & Plan Note (Signed)
 Symptoms do seem similar to pulsatile tinnitus, however she is overall low risk.  HEENT exam today grossly benign. Recommended steroid nasal spray. She will resume her antihistamine regularly.  Labs pending today. Consider ENT referral.

## 2024-04-06 NOTE — Patient Instructions (Signed)
 Stop by the lab prior to leaving today. I will notify you of your results once received.   Consider using Flonase nasal spray.  Resume antihistamine.  It was a pleasure to see you today!

## 2024-04-08 ENCOUNTER — Other Ambulatory Visit: Payer: Self-pay | Admitting: Primary Care

## 2024-04-08 DIAGNOSIS — R739 Hyperglycemia, unspecified: Secondary | ICD-10-CM

## 2024-04-08 DIAGNOSIS — R7989 Other specified abnormal findings of blood chemistry: Secondary | ICD-10-CM

## 2024-04-10 LAB — COMPREHENSIVE METABOLIC PANEL WITH GFR
AG Ratio: 1.7 (calc) (ref 1.0–2.5)
ALT: 10 U/L (ref 6–29)
AST: 16 U/L (ref 10–30)
Albumin: 4.7 g/dL (ref 3.6–5.1)
Alkaline phosphatase (APISO): 36 U/L (ref 31–125)
BUN/Creatinine Ratio: 11 (calc) (ref 6–22)
BUN: 14 mg/dL (ref 7–25)
CO2: 28 mmol/L (ref 20–32)
Calcium: 9.5 mg/dL (ref 8.6–10.2)
Chloride: 102 mmol/L (ref 98–110)
Creat: 1.24 mg/dL — ABNORMAL HIGH (ref 0.50–0.97)
Globulin: 2.7 g/dL (ref 1.9–3.7)
Glucose, Bld: 118 mg/dL — ABNORMAL HIGH (ref 65–99)
Potassium: 4.3 mmol/L (ref 3.5–5.3)
Sodium: 138 mmol/L (ref 135–146)
Total Bilirubin: 1.4 mg/dL — ABNORMAL HIGH (ref 0.2–1.2)
Total Protein: 7.4 g/dL (ref 6.1–8.1)
eGFR: 57 mL/min/{1.73_m2} — ABNORMAL LOW (ref >=60)

## 2024-04-10 LAB — CBC
HCT: 40.3 % (ref 35.0–45.0)
Hemoglobin: 13.4 g/dL (ref 11.7–15.5)
MCH: 29.5 pg (ref 27.0–33.0)
MCHC: 33.3 g/dL (ref 32.0–36.0)
MCV: 88.6 fL (ref 80.0–100.0)
MPV: 10.1 fL (ref 7.5–12.5)
Platelets: 266 10*3/uL (ref 140–400)
RBC: 4.55 10*6/uL (ref 3.80–5.10)
RDW: 12.1 % (ref 11.0–15.0)
WBC: 7.9 10*3/uL (ref 3.8–10.8)

## 2024-04-10 LAB — FERRITIN: Ferritin: 62 ng/mL (ref 16–154)

## 2024-04-10 LAB — TSH: TSH: 0.87 m[IU]/L

## 2024-04-10 LAB — HEMOGLOBIN A1C W/OUT EAG: Hgb A1c MFr Bld: 5.1 % (ref <5.7)

## 2024-04-10 LAB — TEST AUTHORIZATION

## 2024-04-10 LAB — VITAMIN B12: Vitamin B-12: 339 pg/mL (ref 200–1100)

## 2024-04-10 LAB — VITAMIN D 25 HYDROXY (VIT D DEFICIENCY, FRACTURES): Vit D, 25-Hydroxy: 28 ng/mL — ABNORMAL LOW (ref 30–100)

## 2024-05-03 ENCOUNTER — Ambulatory Visit: Payer: BC Managed Care – PPO | Admitting: Dermatology

## 2024-05-15 ENCOUNTER — Other Ambulatory Visit (INDEPENDENT_AMBULATORY_CARE_PROVIDER_SITE_OTHER)

## 2024-05-15 ENCOUNTER — Ambulatory Visit: Payer: Self-pay | Admitting: Primary Care

## 2024-05-15 DIAGNOSIS — R7989 Other specified abnormal findings of blood chemistry: Secondary | ICD-10-CM

## 2024-05-15 DIAGNOSIS — R739 Hyperglycemia, unspecified: Secondary | ICD-10-CM

## 2024-05-15 LAB — BASIC METABOLIC PANEL WITH GFR
BUN: 12 mg/dL (ref 6–23)
CO2: 30 meq/L (ref 19–32)
Calcium: 9.4 mg/dL (ref 8.4–10.5)
Chloride: 103 meq/L (ref 96–112)
Creatinine, Ser: 0.75 mg/dL (ref 0.40–1.20)
GFR: 100.2 mL/min (ref >60.00)
Glucose, Bld: 67 mg/dL — ABNORMAL LOW (ref 70–99)
Potassium: 4.1 meq/L (ref 3.5–5.1)
Sodium: 138 meq/L (ref 135–145)

## 2024-05-15 NOTE — Addendum Note (Signed)
 Addended by: Gerry Krone on: 05/15/2024 09:23 AM   Modules accepted: Orders

## 2024-06-04 ENCOUNTER — Ambulatory Visit: Admitting: Dermatology

## 2024-07-06 ENCOUNTER — Encounter: Payer: Self-pay | Admitting: Primary Care

## 2024-07-06 ENCOUNTER — Ambulatory Visit (INDEPENDENT_AMBULATORY_CARE_PROVIDER_SITE_OTHER): Admitting: Primary Care

## 2024-07-06 ENCOUNTER — Other Ambulatory Visit: Payer: Self-pay | Admitting: Primary Care

## 2024-07-06 ENCOUNTER — Ambulatory Visit (INDEPENDENT_AMBULATORY_CARE_PROVIDER_SITE_OTHER)
Admission: RE | Admit: 2024-07-06 | Discharge: 2024-07-06 | Disposition: A | Discharge status: Home / Self Care | Source: Ambulatory Visit | Attending: Primary Care | Admitting: Primary Care

## 2024-07-06 VITALS — BP 108/62 | HR 86 | Temp 98.4°F | Ht 63.0 in | Wt 125.0 lb

## 2024-07-06 DIAGNOSIS — R1903 Right lower quadrant abdominal swelling, mass and lump: Secondary | ICD-10-CM | POA: Insufficient documentation

## 2024-07-06 DIAGNOSIS — Z1231 Encounter for screening mammogram for malignant neoplasm of breast: Secondary | ICD-10-CM

## 2024-07-06 NOTE — Progress Notes (Signed)
 Subjective:    Patient ID: Lisa Brady, female    DOB: Feb 21, 1986, 38 y.o.   MRN: [id number]  HPI  Lisa Brady is a very pleasant 38 y.o. female history of urinary incontinence who presents today to discuss abdominal pain.   Symptom onset 3 months ago with a mass like sensation to the right lower abdomen for which she thought was stool. She's been participating with pelvic floor PT for urinary incontinence. One month ago and this week her physical therapist noticed the mass and advised she see PCP.  She has noticed right groin pain, but denies pain to the mass site. Believes this is not related.   She has chronic constipation, does take Miralax four times weekly to maintain regular movements. She does question if the mass is stool. She denies unexplained weight loss, changes in stools, bloody stool.   Wt Readings from Last 3 Encounters:  07/06/24 125 lb (56.7 kg)  04/06/24 121 lb (54.9 kg)  02/11/23 125 lb (56.7 kg)      Review of Systems  Constitutional:  Negative for unexpected weight change.  Gastrointestinal:  Positive for constipation. Negative for abdominal pain and blood in stool.         Past Medical History:  Diagnosis Date   Choroid plexus cyst of fetus on prenatal ultrasound 05/29/2021   Dysplastic nevus 04/01/2021   right medial forearm, mod Atypia    Dysplastic nevus 04/01/2021   right inferior axilla, mild atypia    Dysplastic nevus 04/01/2021   right superior medial scapula, mild atypia    Dysplastic nevus 05/05/2022   Right mid med scapula - moderate   Dysplastic nevus 05/05/2022   Right mid to sup buttock - moderate   Dysplastic nevus 05/04/2023   right mid med scapula- recurrent   Foot drop    History of left foot drop 08/17/2019   History of vacuum extraction assisted delivery 04/03/2021   Hypermobility arthralgia    Maternal varicella, non-immune 11/20/2018   Scoliosis    Urinary incontinence    Wears glasses     Social History    Socioeconomic History   Marital status: Married    Spouse name: Jenetta   Number of children: 1   Years of education: Not on file   Highest education level: Professional school degree (e.g., MD, DDS, DVM, JD)  Occupational History   Not on file  Tobacco Use   Smoking status: Never   Smokeless tobacco: Never  Vaping Use   Vaping status: Never Used  Substance and Sexual Activity   Alcohol use: Yes    Alcohol/week: 1.0 standard drink of alcohol    Types: 1 Glasses of wine per week   Drug use: Never   Sexual activity: Yes    Birth control/protection: Pill  Other Topics Concern   Not on file  Social History Narrative   [place] born [birth year]   Social Drivers of Health   Financial Resource Strain: Low Risk  (04/06/2024)   Overall Financial Resource Strain (CARDIA)    Difficulty of Paying Living Expenses: Not hard at all  Food Insecurity: No Food Insecurity (04/06/2024)   Hunger Vital Sign    Worried About Running Out of Food in the Last Year: Never true    Ran Out of Food in the Last Year: Never true  Transportation Needs: No Transportation Needs (04/06/2024)   PRAPARE - Administrator, Civil Service (Medical): No    Lack of Transportation (Non-Medical): No  Physical Activity: Insufficiently Active (04/06/2024)   Exercise Vital Sign    Days of Exercise per Week: 1 day    Minutes of Exercise per Session: 10 min  Stress: Stress Concern Present (04/06/2024)   [employer] of Occupational Health - Occupational Stress Questionnaire    Feeling of Stress : To some extent  Social Connections: Socially Integrated (04/06/2024)   Social Connection and Isolation Panel    Frequency of Communication with Friends and Family: More than three times a week    Frequency of Social Gatherings with Friends and Family: More than three times a week    Attends Religious Services: More than 4 times per year    Active Member of [employer] or Organizations: Yes    Attends Engineer, structural:  More than 4 times per year    Marital Status: Married  Catering manager Violence: Not on file    Past Surgical History:  Procedure Laterality Date   WISDOM TOOTH EXTRACTION  2006    Family History  Problem Relation Age of Onset   Breast cancer Mother 65   Melanoma Mother    Hyperlipidemia Father    Hypertension Father    Hyperlipidemia Brother    Brain cancer Maternal Grandmother    Heart disease Maternal Grandfather    Lung cancer Paternal Grandmother    Lung cancer Paternal Grandfather    Hyperlipidemia Brother    Hyperlipidemia Brother    Hyperlipidemia Brother     No Known Allergies  Current Outpatient Medications on File Prior to Visit  Medication Sig Dispense Refill   cetirizine (ZYRTEC) 10 MG tablet Take 10 mg by mouth at bedtime.     clobetasol  (TEMOVATE ) 0.05 % external solution Apply 1 Application topically 2 (two) times daily. 50 mL 0   ketoconazole  (NIZORAL ) 2 % shampoo apply three times per week, massage into scalp and leave in for 10 minutes before rinsing out 120 mL 6   valACYclovir  (VALTREX ) 1000 MG tablet Take two tablets po bid for one dose prn outbreak 30 tablet 0   ketoconazole  (NIZORAL ) 2 % cream Apply to eyelids once a day Mon, Wed, Fr (Patient not taking: Reported on 07/06/2024) 30 g 6   No current facility-administered medications on file prior to visit.    BP 108/62   Pulse 86   Temp 98.4 F (36.9 C) (Temporal)   Ht 5' 3 (1.6 m)   Wt 125 lb (56.7 kg)   SpO2 98%   BMI 22.14 kg/m  Objective:   Physical Exam Constitutional:      General: She is not in acute distress. Cardiovascular:     Rate and Rhythm: Normal rate.  Pulmonary:     Effort: Pulmonary effort is normal.  Abdominal:     General: Bowel sounds are normal.     Palpations: Abdomen is soft. There is no mass.     Tenderness: There is no abdominal tenderness.           Assessment & Plan:  Right lower quadrant abdominal mass Assessment & Plan: Both patient and I were  unable to locate and palpate mass today on exam. No alarm signs during exam or HPI.  Will obtain abdominal plain films for further evaluation.  Orders: -     DG Abd 2 Views        Comer MARLA Gaskins, NP

## 2024-07-06 NOTE — Patient Instructions (Signed)
Complete xray(s) prior to leaving today. I will notify you of your results once received.  It was a pleasure to see you today!  

## 2024-07-06 NOTE — Assessment & Plan Note (Signed)
 Both patient and I were unable to locate and palpate mass today on exam. No alarm signs during exam or HPI.  Will obtain abdominal plain films for further evaluation.

## 2024-07-08 ENCOUNTER — Ambulatory Visit: Payer: Self-pay | Admitting: Primary Care

## 2024-07-12 ENCOUNTER — Ambulatory Visit: Admitting: Primary Care

## 2024-10-17 DIAGNOSIS — H9319 Tinnitus, unspecified ear: Secondary | ICD-10-CM

## 2024-11-05 ENCOUNTER — Other Ambulatory Visit: Payer: Self-pay | Admitting: Medical Genetics

## 2024-11-06 ENCOUNTER — Encounter: Payer: Self-pay | Admitting: *Deleted

## 2024-11-08 ENCOUNTER — Ambulatory Visit
Admission: RE | Admit: 2024-11-08 | Discharge: 2024-11-08 | Disposition: A | Source: Ambulatory Visit | Attending: Primary Care

## 2024-11-08 DIAGNOSIS — Z1231 Encounter for screening mammogram for malignant neoplasm of breast: Secondary | ICD-10-CM | POA: Diagnosis present

## 2024-11-09 ENCOUNTER — Inpatient Hospital Stay
Admission: RE | Admit: 2024-11-09 | Discharge: 2024-11-09 | Payer: Self-pay | Attending: Medical Genetics | Admitting: Medical Genetics

## 2024-11-09 ENCOUNTER — Ambulatory Visit

## 2024-11-16 ENCOUNTER — Ambulatory Visit: Payer: Self-pay | Admitting: Primary Care

## 2024-11-19 ENCOUNTER — Other Ambulatory Visit: Payer: Self-pay | Admitting: Student

## 2024-11-19 DIAGNOSIS — H93A1 Pulsatile tinnitus, right ear: Secondary | ICD-10-CM

## 2024-11-21 ENCOUNTER — Encounter: Payer: Self-pay | Admitting: Student

## 2024-11-21 LAB — GENECONNECT MOLECULAR SCREEN: Genetic Analysis Overall Interpretation: NEGATIVE

## 2024-12-05 ENCOUNTER — Ambulatory Visit
Admission: RE | Admit: 2024-12-05 | Discharge: 2024-12-05 | Disposition: A | Discharge status: Home / Self Care | Source: Ambulatory Visit | Attending: Student | Admitting: Student

## 2024-12-05 DIAGNOSIS — H93A1 Pulsatile tinnitus, right ear: Secondary | ICD-10-CM

## 2024-12-05 MED ORDER — GADOPICLENOL 0.5 MMOL/ML IV SOLN
7.5000 mL | Freq: Once | INTRAVENOUS | Status: AC | PRN
  Administered 2024-12-05: 6 mL via INTRAVENOUS
